# Patient Record
Sex: Female | Born: 1935
Health system: Southern US, Community
[De-identification: ages and names within clinical notes are randomized; demographics above are authoritative.]

## PROBLEM LIST (undated history)

## (undated) DIAGNOSIS — K219 Gastro-esophageal reflux disease without esophagitis: Secondary | ICD-10-CM

## (undated) DIAGNOSIS — R040 Epistaxis: Secondary | ICD-10-CM

## (undated) DIAGNOSIS — Z953 Presence of xenogenic heart valve: Secondary | ICD-10-CM

## (undated) DIAGNOSIS — I779 Disorder of arteries and arterioles, unspecified: Secondary | ICD-10-CM

## (undated) DIAGNOSIS — D509 Iron deficiency anemia, unspecified: Secondary | ICD-10-CM

## (undated) DIAGNOSIS — F329 Major depressive disorder, single episode, unspecified: Secondary | ICD-10-CM

## (undated) DIAGNOSIS — G4733 Obstructive sleep apnea (adult) (pediatric): Secondary | ICD-10-CM

## (undated) DIAGNOSIS — I471 Supraventricular tachycardia, unspecified: Secondary | ICD-10-CM

## (undated) DIAGNOSIS — R55 Syncope and collapse: Secondary | ICD-10-CM

## (undated) DIAGNOSIS — N390 Urinary tract infection, site not specified: Secondary | ICD-10-CM

## (undated) DIAGNOSIS — Z9889 Other specified postprocedural states: Secondary | ICD-10-CM

## (undated) DIAGNOSIS — I639 Cerebral infarction, unspecified: Secondary | ICD-10-CM

## (undated) DIAGNOSIS — S065XAA Traumatic subdural hemorrhage with loss of consciousness status unknown, initial encounter: Secondary | ICD-10-CM

## (undated) DIAGNOSIS — Z923 Personal history of irradiation: Secondary | ICD-10-CM

## (undated) DIAGNOSIS — I350 Nonrheumatic aortic (valve) stenosis: Secondary | ICD-10-CM

## (undated) DIAGNOSIS — F32A Depression, unspecified: Secondary | ICD-10-CM

## (undated) DIAGNOSIS — K5790 Diverticulosis of intestine, part unspecified, without perforation or abscess without bleeding: Secondary | ICD-10-CM

## (undated) DIAGNOSIS — M1990 Unspecified osteoarthritis, unspecified site: Secondary | ICD-10-CM

## (undated) DIAGNOSIS — I5032 Chronic diastolic (congestive) heart failure: Secondary | ICD-10-CM

## (undated) DIAGNOSIS — C50919 Malignant neoplasm of unspecified site of unspecified female breast: Secondary | ICD-10-CM

## (undated) DIAGNOSIS — H269 Unspecified cataract: Secondary | ICD-10-CM

## (undated) DIAGNOSIS — E785 Hyperlipidemia, unspecified: Secondary | ICD-10-CM

## (undated) DIAGNOSIS — M8580 Other specified disorders of bone density and structure, unspecified site: Secondary | ICD-10-CM

## (undated) DIAGNOSIS — K279 Peptic ulcer, site unspecified, unspecified as acute or chronic, without hemorrhage or perforation: Secondary | ICD-10-CM

## (undated) DIAGNOSIS — R011 Cardiac murmur, unspecified: Secondary | ICD-10-CM

## (undated) DIAGNOSIS — I10 Essential (primary) hypertension: Secondary | ICD-10-CM

## (undated) HISTORY — DX: Other specified postprocedural states: Z98.890

## (undated) HISTORY — DX: Disorder of arteries and arterioles, unspecified: I77.9

## (undated) HISTORY — DX: Gastro-esophageal reflux disease without esophagitis: K21.9

## (undated) HISTORY — DX: Supraventricular tachycardia: I47.1

## (undated) HISTORY — DX: Syncope and collapse: R55

## (undated) HISTORY — DX: Cardiac murmur, unspecified: R01.1

## (undated) HISTORY — DX: Urinary tract infection, site not specified: N39.0

## (undated) HISTORY — DX: Nonrheumatic aortic (valve) stenosis: I35.0

## (undated) HISTORY — DX: Supraventricular tachycardia, unspecified: I47.10

## (undated) HISTORY — PX: CHOLECYSTECTOMY: SHX55

## (undated) HISTORY — DX: Peptic ulcer, site unspecified, unspecified as acute or chronic, without hemorrhage or perforation: K27.9

## (undated) HISTORY — PX: VAGINAL HYSTERECTOMY: SUR661

## (undated) HISTORY — DX: Hyperlipidemia, unspecified: E78.5

## (undated) HISTORY — DX: Traumatic subdural hemorrhage with loss of consciousness status unknown, initial encounter: S06.5XAA

## (undated) HISTORY — DX: Chronic diastolic (congestive) heart failure: I50.32

## (undated) HISTORY — DX: Other specified disorders of bone density and structure, unspecified site: M85.80

## (undated) HISTORY — DX: Iron deficiency anemia, unspecified: D50.9

## (undated) HISTORY — DX: Unspecified cataract: H26.9

## (undated) HISTORY — PX: TONSILLECTOMY: SUR1361

## (undated) HISTORY — PX: JOINT REPLACEMENT: SHX530

## (undated) HISTORY — DX: Diverticulosis of intestine, part unspecified, without perforation or abscess without bleeding: K57.90

## (undated) HISTORY — DX: Unspecified osteoarthritis, unspecified site: M19.90

## (undated) HISTORY — DX: Obstructive sleep apnea (adult) (pediatric): G47.33

## (undated) HISTORY — PX: CATARACT EXTRACTION, BILATERAL: SHX1313

## (undated) HISTORY — DX: Malignant neoplasm of unspecified site of unspecified female breast: C50.919

## (undated) HISTORY — PX: BREAST EXCISIONAL BIOPSY: SUR124

## (undated) HISTORY — DX: Depression, unspecified: F32.A

## (undated) HISTORY — PX: KNEE ARTHROSCOPY: SUR90

## (undated) HISTORY — DX: Cerebral infarction, unspecified: I63.9

## (undated) HISTORY — DX: Major depressive disorder, single episode, unspecified: F32.9

---

## 1997-10-15 DIAGNOSIS — C50919 Malignant neoplasm of unspecified site of unspecified female breast: Secondary | ICD-10-CM

## 1997-10-15 HISTORY — PX: BREAST BIOPSY: SHX20

## 1997-10-15 HISTORY — PX: BREAST LUMPECTOMY: SHX2

## 1997-10-15 HISTORY — DX: Malignant neoplasm of unspecified site of unspecified female breast: C50.919

## 2004-09-14 ENCOUNTER — Inpatient Hospital Stay: Payer: Self-pay | Admitting: Endocrinology

## 2004-10-31 ENCOUNTER — Ambulatory Visit: Payer: Self-pay | Admitting: Rheumatology

## 2004-11-07 ENCOUNTER — Ambulatory Visit: Payer: Self-pay | Admitting: Endocrinology

## 2005-02-07 ENCOUNTER — Ambulatory Visit: Payer: Self-pay | Admitting: General Practice

## 2005-12-18 ENCOUNTER — Ambulatory Visit: Payer: Self-pay | Admitting: Endocrinology

## 2007-01-06 ENCOUNTER — Ambulatory Visit: Payer: Self-pay | Admitting: Internal Medicine

## 2007-03-15 ENCOUNTER — Emergency Department: Payer: Self-pay | Admitting: Emergency Medicine

## 2007-03-15 ENCOUNTER — Other Ambulatory Visit: Payer: Self-pay

## 2007-06-26 ENCOUNTER — Ambulatory Visit: Payer: Self-pay | Admitting: Specialist

## 2007-07-10 ENCOUNTER — Ambulatory Visit: Payer: Self-pay | Admitting: Internal Medicine

## 2008-01-12 ENCOUNTER — Ambulatory Visit: Payer: Self-pay | Admitting: Internal Medicine

## 2008-06-10 ENCOUNTER — Emergency Department: Payer: Self-pay | Admitting: Emergency Medicine

## 2008-06-30 ENCOUNTER — Ambulatory Visit: Payer: Self-pay

## 2009-01-13 ENCOUNTER — Ambulatory Visit: Payer: Self-pay | Admitting: Internal Medicine

## 2009-06-12 ENCOUNTER — Emergency Department: Payer: Self-pay | Admitting: Emergency Medicine

## 2010-04-10 ENCOUNTER — Ambulatory Visit: Payer: Self-pay | Admitting: Internal Medicine

## 2010-10-27 ENCOUNTER — Emergency Department: Payer: Self-pay | Admitting: Emergency Medicine

## 2010-12-20 ENCOUNTER — Ambulatory Visit: Payer: Self-pay

## 2011-01-26 ENCOUNTER — Ambulatory Visit: Payer: Self-pay | Admitting: General Practice

## 2011-02-09 ENCOUNTER — Ambulatory Visit: Payer: Self-pay | Admitting: General Practice

## 2011-04-12 ENCOUNTER — Ambulatory Visit: Payer: Self-pay | Admitting: Internal Medicine

## 2011-05-27 ENCOUNTER — Emergency Department: Payer: Self-pay | Admitting: Unknown Physician Specialty

## 2011-05-31 ENCOUNTER — Ambulatory Visit: Payer: Self-pay | Admitting: Internal Medicine

## 2011-10-22 ENCOUNTER — Ambulatory Visit: Payer: Self-pay | Admitting: General Practice

## 2011-10-22 LAB — URINALYSIS, COMPLETE
Bacteria: NONE SEEN
Bilirubin,UR: NEGATIVE
Blood: NEGATIVE
Glucose,UR: NEGATIVE mg/dL (ref 0–75)
Ketone: NEGATIVE
Leukocyte Esterase: NEGATIVE
Ph: 5 (ref 4.5–8.0)
RBC,UR: <1 /HPF (ref 0–5)
Specific Gravity: 1.02 (ref 1.003–1.030)
Squamous Epithelial: 3
WBC UR: <1 /HPF (ref 0–5)

## 2011-10-22 LAB — APTT: Activated PTT: 28.6 secs (ref 23.6–35.9)

## 2011-10-22 LAB — BASIC METABOLIC PANEL
BUN: 20 mg/dL — ABNORMAL HIGH (ref 7–18)
Calcium, Total: 8.9 mg/dL (ref 8.5–10.1)
Co2: 28 mmol/L (ref 21–32)
Creatinine: 1.03 mg/dL (ref 0.60–1.30)
EGFR (Non-African Amer.): 56 — ABNORMAL LOW
Glucose: 88 mg/dL (ref 65–99)
Potassium: 4.3 mmol/L (ref 3.5–5.1)
Sodium: 143 mmol/L (ref 136–145)

## 2011-10-22 LAB — MRSA PCR SCREENING

## 2011-10-22 LAB — PROTIME-INR
INR: 1
Prothrombin Time: 13.2 secs (ref 11.5–14.7)

## 2011-10-23 LAB — URINE CULTURE

## 2011-11-12 ENCOUNTER — Inpatient Hospital Stay: Payer: Self-pay | Admitting: General Practice

## 2011-11-13 LAB — BASIC METABOLIC PANEL
Anion Gap: 11 (ref 7–16)
Calcium, Total: 8.6 mg/dL (ref 8.5–10.1)
Chloride: 107 mmol/L (ref 98–107)
Co2: 26 mmol/L (ref 21–32)
Creatinine: 0.97 mg/dL (ref 0.60–1.30)
EGFR (African American): >60
EGFR (Non-African Amer.): 60 — ABNORMAL LOW
Glucose: 147 mg/dL — ABNORMAL HIGH (ref 65–99)
Osmolality: 290 (ref 275–301)
Sodium: 144 mmol/L (ref 136–145)

## 2011-11-13 LAB — HEMOGLOBIN: HGB: 10 g/dL — ABNORMAL LOW (ref 12.0–16.0)

## 2011-11-14 LAB — BASIC METABOLIC PANEL
BUN: 15 mg/dL (ref 7–18)
Calcium, Total: 8.2 mg/dL — ABNORMAL LOW (ref 8.5–10.1)
Chloride: 108 mmol/L — ABNORMAL HIGH (ref 98–107)
Co2: 28 mmol/L (ref 21–32)
Creatinine: 0.85 mg/dL (ref 0.60–1.30)
EGFR (African American): >60
EGFR (Non-African Amer.): >60
Glucose: 99 mg/dL (ref 65–99)
Potassium: 3.8 mmol/L (ref 3.5–5.1)
Sodium: 144 mmol/L (ref 136–145)

## 2011-11-14 LAB — HEMOGLOBIN
HGB: 8.7 g/dL — ABNORMAL LOW (ref 12.0–16.0)
HGB: 9.5 g/dL — ABNORMAL LOW (ref 12.0–16.0)

## 2011-11-14 LAB — PLATELET COUNT: Platelet: 105 10*3/uL — ABNORMAL LOW (ref 150–440)

## 2011-11-15 ENCOUNTER — Encounter: Payer: Self-pay | Admitting: Internal Medicine

## 2011-11-16 ENCOUNTER — Encounter: Payer: Self-pay | Admitting: Internal Medicine

## 2011-11-17 ENCOUNTER — Inpatient Hospital Stay: Payer: Self-pay | Admitting: Internal Medicine

## 2011-11-17 LAB — COMPREHENSIVE METABOLIC PANEL
Albumin: 2.8 g/dL — ABNORMAL LOW (ref 3.4–5.0)
Alkaline Phosphatase: 56 U/L (ref 50–136)
Anion Gap: 9 (ref 7–16)
Bilirubin,Total: 0.5 mg/dL (ref 0.2–1.0)
Calcium, Total: 8.4 mg/dL — ABNORMAL LOW (ref 8.5–10.1)
EGFR (Non-African Amer.): >60
Glucose: 129 mg/dL — ABNORMAL HIGH (ref 65–99)
Osmolality: 288 (ref 275–301)
Potassium: 3.4 mmol/L — ABNORMAL LOW (ref 3.5–5.1)
SGPT (ALT): 29 U/L
Sodium: 143 mmol/L (ref 136–145)

## 2011-11-17 LAB — CK TOTAL AND CKMB (NOT AT ARMC)
CK, Total: 177 U/L (ref 21–215)
CK-MB: 2.1 ng/mL (ref 0.5–3.6)
CK-MB: 6 ng/mL — ABNORMAL HIGH (ref 0.5–3.6)

## 2011-11-17 LAB — URINALYSIS, COMPLETE
Bacteria: NONE SEEN
Bilirubin,UR: NEGATIVE
Blood: NEGATIVE
Glucose,UR: NEGATIVE mg/dL (ref 0–75)
Leukocyte Esterase: NEGATIVE
Nitrite: NEGATIVE
Specific Gravity: 1.011 (ref 1.003–1.030)
Squamous Epithelial: <1
WBC UR: 1 /HPF (ref 0–5)

## 2011-11-17 LAB — CBC
HCT: 27.7 % — ABNORMAL LOW (ref 35.0–47.0)
HGB: 9.2 g/dL — ABNORMAL LOW (ref 12.0–16.0)
MCH: 28.3 pg (ref 26.0–34.0)
MCHC: 33 g/dL (ref 32.0–36.0)
Platelet: 122 10*3/uL — ABNORMAL LOW (ref 150–440)
RBC: 3.23 10*6/uL — ABNORMAL LOW (ref 3.80–5.20)

## 2011-11-17 LAB — APTT: Activated PTT: 35.4 secs (ref 23.6–35.9)

## 2011-11-17 LAB — PROTIME-INR
INR: 1
Prothrombin Time: 13.5 secs (ref 11.5–14.7)

## 2011-11-17 LAB — TROPONIN I
Troponin-I: 4.2 ng/mL — ABNORMAL HIGH
Troponin-I: 2.1 ng/mL — ABNORMAL HIGH

## 2011-11-18 LAB — CBC WITH DIFFERENTIAL/PLATELET
Basophil #: 0 10*3/uL (ref 0.0–0.1)
HCT: 26.4 % — ABNORMAL LOW (ref 35.0–47.0)
Lymphocyte #: 1.9 10*3/uL (ref 1.0–3.6)
Lymphocyte %: 32 %
Monocyte %: 8.7 %
Neutrophil #: 2.9 10*3/uL (ref 1.4–6.5)
Neutrophil %: 49.2 %
Platelet: 140 10*3/uL — ABNORMAL LOW (ref 150–440)
RBC: 3.08 10*6/uL — ABNORMAL LOW (ref 3.80–5.20)
RDW: 13.7 % (ref 11.5–14.5)
WBC: 6 10*3/uL (ref 3.6–11.0)

## 2011-11-18 LAB — BASIC METABOLIC PANEL
Calcium, Total: 8.6 mg/dL (ref 8.5–10.1)
Chloride: 109 mmol/L — ABNORMAL HIGH (ref 98–107)
Co2: 27 mmol/L (ref 21–32)
Potassium: 3.7 mmol/L (ref 3.5–5.1)
Sodium: 146 mmol/L — ABNORMAL HIGH (ref 136–145)

## 2012-04-21 ENCOUNTER — Ambulatory Visit: Payer: Self-pay | Admitting: Internal Medicine

## 2012-11-17 ENCOUNTER — Encounter: Payer: Self-pay | Admitting: *Deleted

## 2012-11-18 ENCOUNTER — Encounter: Payer: Self-pay | Admitting: *Deleted

## 2012-11-21 ENCOUNTER — Ambulatory Visit: Payer: Self-pay | Admitting: Cardiovascular Disease

## 2012-11-25 ENCOUNTER — Encounter: Payer: Self-pay | Admitting: *Deleted

## 2012-11-26 ENCOUNTER — Ambulatory Visit (INDEPENDENT_AMBULATORY_CARE_PROVIDER_SITE_OTHER): Payer: Medicare Other | Admitting: Cardiovascular Disease

## 2012-11-26 ENCOUNTER — Encounter: Payer: Self-pay | Admitting: Cardiovascular Disease

## 2012-11-26 VITALS — BP 130/82 | HR 83 | Ht 62.0 in | Wt 180.5 lb

## 2012-11-26 DIAGNOSIS — R55 Syncope and collapse: Secondary | ICD-10-CM

## 2012-11-26 DIAGNOSIS — I498 Other specified cardiac arrhythmias: Secondary | ICD-10-CM

## 2012-11-26 DIAGNOSIS — I359 Nonrheumatic aortic valve disorder, unspecified: Secondary | ICD-10-CM

## 2012-11-26 DIAGNOSIS — I471 Supraventricular tachycardia: Secondary | ICD-10-CM

## 2012-11-26 DIAGNOSIS — I350 Nonrheumatic aortic (valve) stenosis: Secondary | ICD-10-CM

## 2012-11-26 DIAGNOSIS — R000 Tachycardia, unspecified: Secondary | ICD-10-CM

## 2012-11-26 NOTE — Patient Instructions (Addendum)
You are doing well. No medication changes were made.  Try red yeast rice for cholesterol  We will schedule an echo for 6 month for aortic valve stenosis  Please call us if you have new issues that need to be addressed before your next appt.  Your physician wants you to follow-up in: 12 months.  You will receive a reminder letter in the mail two months in advance. If you don't receive a letter, please call our office to schedule the follow-up appointment.

## 2012-11-27 DIAGNOSIS — R55 Syncope and collapse: Secondary | ICD-10-CM | POA: Insufficient documentation

## 2012-11-27 DIAGNOSIS — I350 Nonrheumatic aortic (valve) stenosis: Secondary | ICD-10-CM | POA: Insufficient documentation

## 2012-11-27 DIAGNOSIS — I471 Supraventricular tachycardia: Secondary | ICD-10-CM | POA: Insufficient documentation

## 2012-11-27 NOTE — Assessment & Plan Note (Signed)
Details were discussed with the patient and her husband. Uncertain if this was a mechanical fall, or there was loss of consciousness. Husband reports being married and there was no loss of consciousness per his story

## 2012-11-27 NOTE — Assessment & Plan Note (Signed)
By the recent echo January 2014, aortic valve stenosis is moderate. Aortic valve velocity, mean and peak pressures are consistent with moderate stenosis, aortic valve area by continuity equation is likely inaccurate and over estimates the degree of stenosis. Velocity and pressure measurements also consistent with prior echocardiogram August 2012. I suggested she repeat echocardiogram in 6 months time with different echo technician. I suspect she'll have many years prior to needing surgery given the parameters mentioned found on her echocardiogram and given her lack of symptoms.

## 2012-11-27 NOTE — Progress Notes (Signed)
Patient ID: Kayla Gray, female    DOB: November 18, 1935, 77 y.o.   MRN: [id number]  HPI Comments: Kayla Gray is a very pleasant 77 year old woman, patient of Dr. Randa Gray, who presents to establish care in our office. She has a history of aortic valve stenosis, SVT, syncope, hyperlipidemia, iron deficiency anemia, peptic ulcer disease Status post left total knee replacement 11/15/2011 Notes provided indicate a history of SVT in February 2013 while in rehabilitation requiring adenosine, diltiazem and beta blockers, admitted with SVT, elevated troponin felt secondary to demand ischemia from tachycardia. She was started on diltiazem 120 mg daily  Carotid ultrasound August 2012 showed no significant plaque  She presents to discuss her echo cardiogram performed 01/ 8 2014 The shows normal LV function, aortic valve velocity 315 cm/s, peak gradient 56 mm of mercury, mean gradient 19 mm of mercury, estimated aortic valve area 0.61 cm  Echocardiogram in August 2012 velocity through the aortic valve 248 cm/s, peak gradient 25 mm of mercury, mean gradient 14 mm mercury, estimated aortic valve 0.8-1.0 cm  Stress test August 2012 showed normal EKG with exertion, normal stress echo  Overall she feels well and has no symptoms. She does not want any aortic valve surgery at this time. She denies any edema, no chest pressure, no lightheadedness or dizziness. Overall feels well  EKG today shows normal sinus rhythm with rate 83 beats per minute, no significant ST or T wave changes   Outpatient Encounter Prescriptions as of 11/26/2012  Medication Sig Dispense Refill  . acetaminophen (TYLENOL) 500 MG tablet Take 1,000 mg by mouth as needed.      Marland Kitchen aspirin 81 MG tablet Take 81 mg by mouth daily.      . Calcium Carbonate-Vitamin D (CALCIUM 600 + D PO) Take by mouth 2 (two) times daily.      Marland Kitchen diltiazem (CARDIZEM) 120 MG tablet Take 120 mg by mouth daily.      . fluticasone (FLONASE) 50 MCG/ACT nasal spray Place 2  sprays into the nose daily as needed.      . meloxicam (MOBIC) 7.5 MG tablet Take 7.5 mg by mouth 2 (two) times daily.      . Multiple Vitamin (MULTIVITAMIN) tablet Take 1 tablet by mouth daily.      . NON FORMULARY at bedtime. CPAP      . omeprazole (PRILOSEC) 20 MG capsule Take 20 mg by mouth daily.      Marland Kitchen PARoxetine (PAXIL) 20 MG tablet Take 20 mg by mouth daily.      . polyethylene glycol (MIRALAX / GLYCOLAX) packet Take 17 g by mouth as needed.      . [DISCONTINUED] cetirizine (ZYRTEC) 10 MG chewable tablet Chew 10 mg by mouth daily.       No facility-administered encounter medications on file as of 11/26/2012.     Review of Systems  Constitutional: Negative.   HENT: Negative.   Eyes: Negative.   Respiratory: Negative.   Cardiovascular: Negative.   Gastrointestinal: Negative.   Musculoskeletal: Negative.   Skin: Negative.   Neurological: Negative.   Psychiatric/Behavioral: Negative.   All other systems reviewed and are negative.    BP 130/82  Pulse 83  Ht 5\' 2"  (1.575 m)  Wt 180 lb 8 oz (81.874 kg)  BMI 33.01 kg/m2  Physical Exam  Nursing note and vitals reviewed. Constitutional: She is oriented to person, place, and time. She appears well-developed and well-nourished.  HENT:  Head: Normocephalic.  Nose: Nose normal.  Mouth/Throat: Oropharynx  is clear and moist.  Eyes: Conjunctivae are normal. Pupils are equal, round, and reactive to light.  Neck: Normal range of motion. Neck supple. No JVD present.  Cardiovascular: Normal rate, regular rhythm, S1 normal, S2 normal and intact distal pulses.  Exam reveals no gallop and no friction rub.   Murmur heard.  Crescendo systolic murmur is present with a grade of 2/6  Pulmonary/Chest: Effort normal and breath sounds normal. No respiratory distress. She has no wheezes. She has no rales. She exhibits no tenderness.  Abdominal: Soft. Bowel sounds are normal. She exhibits no distension. There is no tenderness.  Musculoskeletal:  Normal range of motion. She exhibits no edema and no tenderness.  Lymphadenopathy:    She has no cervical adenopathy.  Neurological: She is alert and oriented to person, place, and time. Coordination normal.  Skin: Skin is warm and dry. No rash noted. No erythema.  Psychiatric: She has a normal mood and affect. Her behavior is normal. Judgment and thought content normal.    Assessment and Plan

## 2012-11-27 NOTE — Assessment & Plan Note (Signed)
SVT in the postoperative period following knee replacement  February 2013. No further episodes since that time or at least no significant symptoms.

## 2013-01-07 ENCOUNTER — Other Ambulatory Visit: Payer: Self-pay | Admitting: *Deleted

## 2013-01-07 MED ORDER — DILTIAZEM HCL 120 MG PO TABS: 120.0000 mg | ORAL_TABLET | Freq: Every day | ORAL | Status: DC

## 2013-04-30 ENCOUNTER — Ambulatory Visit: Payer: Self-pay | Admitting: Internal Medicine

## 2013-05-26 ENCOUNTER — Other Ambulatory Visit (INDEPENDENT_AMBULATORY_CARE_PROVIDER_SITE_OTHER): Payer: Medicare Other

## 2013-05-26 ENCOUNTER — Other Ambulatory Visit: Payer: Self-pay

## 2013-05-26 DIAGNOSIS — R55 Syncope and collapse: Secondary | ICD-10-CM

## 2013-05-26 DIAGNOSIS — I350 Nonrheumatic aortic (valve) stenosis: Secondary | ICD-10-CM

## 2013-05-26 DIAGNOSIS — I359 Nonrheumatic aortic valve disorder, unspecified: Secondary | ICD-10-CM

## 2013-06-01 ENCOUNTER — Telehealth: Payer: Self-pay | Admitting: *Deleted

## 2013-06-01 NOTE — Telephone Encounter (Signed)
Pt aware of results.  She will follow up with Dr. Mariah Milling at her scheduled appt in February.  She is scheduled to have a colonoscopy in November and will her GI MD fax over paperwork for cardiac clearance.

## 2013-06-01 NOTE — Telephone Encounter (Signed)
Message copied by Marilynne Halsted on Mon Jun 01, 2013  3:07 PM ------      Message from: Antonieta Iba      Created: Mon Jun 01, 2013  1:17 PM       Aortic valve stenosis continues to get worse      Our echo is consistent with prior echo in early 2014      Aortic valve is now even worse than earlier in the year            We'll need to keep a close eye on this      Would probably followup before the end of the year in clinic only if she has no symptoms.            Symptoms of shortness of breath, chest tightness with exertion, she should followup right away      Would not wait until something bad happens      Will likely need to have this valve fixed in the next year or so ------

## 2013-06-01 NOTE — Telephone Encounter (Signed)
Patient wanting echo results

## 2013-06-01 NOTE — Telephone Encounter (Signed)
Message copied by Marilynne Halsted on Mon Jun 01, 2013  3:10 PM ------      Message from: Antonieta Iba      Created: Mon Jun 01, 2013  1:17 PM       Aortic valve stenosis continues to get worse      Our echo is consistent with prior echo in early 2014      Aortic valve is now even worse than earlier in the year            We'll need to keep a close eye on this      Would probably followup before the end of the year in clinic only if she has no symptoms.            Symptoms of shortness of breath, chest tightness with exertion, she should followup right away      Would not wait until something bad happens      Will likely need to have this valve fixed in the next year or so ------

## 2013-07-02 ENCOUNTER — Ambulatory Visit: Payer: Self-pay | Admitting: Unknown Physician Specialty

## 2013-11-16 ENCOUNTER — Inpatient Hospital Stay: Payer: Self-pay | Admitting: Internal Medicine

## 2013-11-16 LAB — CBC WITH DIFFERENTIAL/PLATELET
BASOS PCT: 0.8 %
Basophil #: 0.1 10*3/uL (ref 0.0–0.1)
Eosinophil #: 0.3 10*3/uL (ref 0.0–0.7)
Eosinophil %: 3.3 %
HCT: 39.6 % (ref 35.0–47.0)
HGB: 12.8 g/dL (ref 12.0–16.0)
LYMPHS ABS: 1.7 10*3/uL (ref 1.0–3.6)
Lymphocyte %: 22.4 %
MCH: 27.5 pg (ref 26.0–34.0)
MCHC: 32.4 g/dL (ref 32.0–36.0)
MCV: 85 fL (ref 80–100)
Monocyte #: 0.6 x10 3/mm (ref 0.2–0.9)
Monocyte %: 7.8 %
Neutrophil #: 5.1 10*3/uL (ref 1.4–6.5)
Neutrophil %: 65.7 %
Platelet: 149 10*3/uL — ABNORMAL LOW (ref 150–440)
RBC: 4.66 10*6/uL (ref 3.80–5.20)
RDW: 14.2 % (ref 11.5–14.5)
WBC: 7.7 10*3/uL (ref 3.6–11.0)

## 2013-11-16 LAB — BASIC METABOLIC PANEL
ANION GAP: 4 — AB (ref 7–16)
BUN: 14 mg/dL (ref 7–18)
CREATININE: 1.03 mg/dL (ref 0.60–1.30)
Calcium, Total: 9.6 mg/dL (ref 8.5–10.1)
Chloride: 106 mmol/L (ref 98–107)
Co2: 29 mmol/L (ref 21–32)
GFR CALC NON AF AMER: 53 — AB
GLUCOSE: 81 mg/dL (ref 65–99)
Osmolality: 277 (ref 275–301)
POTASSIUM: 3.7 mmol/L (ref 3.5–5.1)
Sodium: 139 mmol/L (ref 136–145)

## 2013-11-16 LAB — TROPONIN I

## 2013-11-17 DIAGNOSIS — I517 Cardiomegaly: Secondary | ICD-10-CM

## 2013-11-17 LAB — LIPID PANEL
Cholesterol: 202 mg/dL — ABNORMAL HIGH (ref 0–200)
HDL: 41 mg/dL (ref 40–60)
LDL CHOLESTEROL, CALC: 129 mg/dL — AB (ref 0–100)
TRIGLYCERIDES: 159 mg/dL (ref 0–200)
VLDL CHOLESTEROL, CALC: 32 mg/dL (ref 5–40)

## 2013-11-18 ENCOUNTER — Encounter: Payer: Self-pay | Admitting: Internal Medicine

## 2013-11-19 LAB — URINALYSIS, COMPLETE
Bilirubin,UR: NEGATIVE
Glucose,UR: NEGATIVE mg/dL (ref 0–75)
KETONE: NEGATIVE
Leukocyte Esterase: NEGATIVE
NITRITE: NEGATIVE
Ph: 5 (ref 4.5–8.0)
Protein: NEGATIVE
RBC,UR: 2 /HPF (ref 0–5)
SPECIFIC GRAVITY: 1.013 (ref 1.003–1.030)
Squamous Epithelial: NONE SEEN
WBC UR: 6 /HPF (ref 0–5)

## 2013-11-21 LAB — URINE CULTURE

## 2013-11-25 ENCOUNTER — Ambulatory Visit (INDEPENDENT_AMBULATORY_CARE_PROVIDER_SITE_OTHER): Payer: Medicare Other | Admitting: Cardiovascular Disease

## 2013-11-25 ENCOUNTER — Encounter: Payer: Self-pay | Admitting: Cardiovascular Disease

## 2013-11-25 VITALS — BP 144/85 | HR 80 | Ht 62.5 in | Wt 177.0 lb

## 2013-11-25 DIAGNOSIS — R55 Syncope and collapse: Secondary | ICD-10-CM

## 2013-11-25 DIAGNOSIS — I498 Other specified cardiac arrhythmias: Secondary | ICD-10-CM

## 2013-11-25 DIAGNOSIS — I350 Nonrheumatic aortic (valve) stenosis: Secondary | ICD-10-CM

## 2013-11-25 DIAGNOSIS — R42 Dizziness and giddiness: Secondary | ICD-10-CM

## 2013-11-25 DIAGNOSIS — I359 Nonrheumatic aortic valve disorder, unspecified: Secondary | ICD-10-CM

## 2013-11-25 DIAGNOSIS — I639 Cerebral infarction, unspecified: Secondary | ICD-10-CM | POA: Insufficient documentation

## 2013-11-25 DIAGNOSIS — I6350 Cerebral infarction due to unspecified occlusion or stenosis of unspecified cerebral artery: Secondary | ICD-10-CM

## 2013-11-25 DIAGNOSIS — I471 Supraventricular tachycardia: Secondary | ICD-10-CM

## 2013-11-25 NOTE — Assessment & Plan Note (Signed)
Moderate aortic valve stenosis seen previously on echocardiogram January 2014.

## 2013-11-25 NOTE — Assessment & Plan Note (Addendum)
No recent episodes of SVT. 30 day monitor ordered to rule out other arrhythmia given recent stroke and dizziness

## 2013-11-25 NOTE — Assessment & Plan Note (Signed)
No recent episodes of syncope.

## 2013-11-25 NOTE — Assessment & Plan Note (Signed)
Recent stroke of uncertain etiology. She does report having periods of tachycardia, dizziness, occasional shortness of breath. Unable to exclude episodes of atrial fibrillation or other arrhythmia. She has had previous episodes of SVT. Dizziness episodes feel different. We have ordered a 30 day monitor to rule out arrhythmia.

## 2013-11-25 NOTE — Progress Notes (Signed)
Patient ID: Kayla Gray, female    DOB: 1936/05/27, 78 y.o.   MRN: [id number]  HPI Comments: [name] is a very pleasant 78 year old woman who presents for routine followup.  history of aortic valve stenosis, SVT, syncope, hyperlipidemia, iron deficiency anemia, peptic ulcer disease Status post left total knee replacement 11/15/2011 Notes provided indicate a history of SVT in February 2013 while in rehabilitation requiring adenosine, diltiazem and beta blockers, admitted with SVT, elevated troponin felt secondary to demand ischemia from tachycardia. She was started on diltiazem 120 mg daily  Carotid ultrasound August 2012 showed no significant plaque  echocardiogram 01/ 8 2014 showed normal LV function, aortic valve velocity 315 cm/s, peak gradient 56 mm of mercury, mean gradient 19 mm of mercury, estimated aortic valve area 0.61 cm  Echocardiogram in August 2012 velocity through the aortic valve 248 cm/s, peak gradient 25 mm of mercury, mean gradient 14 mm mercury, estimated aortic valve 0.8-1.0 cm Stress test August 2012 showed normal EKG with exertion, normal stress echo  In followup today, she reports that on 11/15/2013 she felt funny in her chest while she was in bed. She stood up, had left leg numbness like it was "dead". It gave way from underneath her. She continued to have symptoms on every second and presented to the emergency room February 3. She had CT scan, MRI, echocardiogram, carotid ultrasound. CT and ultrasound were unrevealing. MRI showed acute posterior right corona radiata lacunar infarct. Also has small remote bilateral cerebral infarcts, mild chronic small vessel ischemic disease in cerebral atrophy. Her aspirin was held and she was started on Plavix at the time of discharge. This was not discussed with the patient. She is currently in rehabilitation at [facility] and reports that her left leg strength is slowly improving.   Interestingly echocardiogram was done while she  was in the hospital and he was detailed that she had moderate calcification, fusion of left and right on her cusp. No degree of aortic valve stenosis was mentioned. Peak gradient estimated at 33 mm of mercury, estimated aortic valve area 0.8, peak velocity 2.9 m/s  EKG today shows normal sinus rhythm with rate 80 beats per minute, no significant ST or T wave changes   Outpatient Encounter Prescriptions as of 11/25/2013  Medication Sig  . acetaminophen (TYLENOL) 325 MG tablet Take 650 mg by mouth every 6 (six) hours as needed.  Marland Kitchen aspirin 81 MG tablet Take 81 mg by mouth daily.  Marland Kitchen atorvastatin (LIPITOR) 40 MG tablet Take 40 mg by mouth daily.  . Calcium Carbonate-Vitamin D (CALCIUM 600 + D PO) Take by mouth 2 (two) times daily.  . clopidogrel (PLAVIX) 75 MG tablet Take 75 mg by mouth daily with breakfast.  . diltiazem (CARDIZEM) 120 MG tablet Take 1 tablet (120 mg total) by mouth daily.  . fluticasone (FLONASE) 50 MCG/ACT nasal spray Place 2 sprays into the nose daily as needed.  . fosfomycin (MONUROL) 3 G PACK Take 3 g by mouth every 3 (three) days.  . meloxicam (MOBIC) 7.5 MG tablet Take 7.5 mg by mouth daily.   . Multiple Vitamin (MULTIVITAMIN) tablet Take 1 tablet by mouth daily.  . NON FORMULARY at bedtime. CPAP  . omeprazole (PRILOSEC) 20 MG capsule Take 20 mg by mouth 2 (two) times daily before a meal.   . PARoxetine (PAXIL) 20 MG tablet Take 20 mg by mouth daily.  . polyethylene glycol (MIRALAX / GLYCOLAX) packet Take 17 g by mouth as needed.  . sennosides-docusate sodium (  SENOKOT-S) 8.6-50 MG tablet Take 1 tablet by mouth 2 (two) times daily.    Review of Systems  Constitutional: Negative.   HENT: Negative.   Eyes: Negative.   Respiratory: Negative.   Cardiovascular: Negative.   Gastrointestinal: Negative.   Endocrine: Negative.   Musculoskeletal: Negative.   Skin: Negative.   Allergic/Immunologic: Negative.   Neurological: Negative.   Hematological: Negative.    Psychiatric/Behavioral: Negative.   All other systems reviewed and are negative.    Ht 5' 2.5" (1.588 m)  Wt 177 lb (80.287 kg)  BMI 31.84 kg/m2 Blood pressure 144/85, heart rate 80  Physical Exam  Nursing note and vitals reviewed. Constitutional: She is oriented to person, place, and time. She appears well-developed and well-nourished.  HENT:  Head: Normocephalic.  Nose: Nose normal.  Mouth/Throat: Oropharynx is clear and moist.  Eyes: Conjunctivae are normal. Pupils are equal, round, and reactive to light.  Neck: Normal range of motion. Neck supple. No JVD present.  Cardiovascular: Normal rate, regular rhythm, S1 normal, S2 normal and intact distal pulses.  Exam reveals no gallop and no friction rub.   Murmur heard.  Crescendo systolic murmur is present with a grade of 2/6  Pulmonary/Chest: Effort normal and breath sounds normal. No respiratory distress. She has no wheezes. She has no rales. She exhibits no tenderness.  Abdominal: Soft. Bowel sounds are normal. She exhibits no distension. There is no tenderness.  Musculoskeletal: Normal range of motion. She exhibits no edema and no tenderness.  Lymphadenopathy:    She has no cervical adenopathy.  Neurological: She is alert and oriented to person, place, and time. Coordination normal.  Skin: Skin is warm and dry. No rash noted. No erythema.  Psychiatric: She has a normal mood and affect. Her behavior is normal. Judgment and thought content normal.    Assessment and Plan

## 2013-11-25 NOTE — Patient Instructions (Signed)
Please start aspirin 81 mg daily Continue plavix 75 mg daily  We will order a 30 day monitor for dizziness, CVA, syncope  Please call us if you have new issues that need to be addressed before your next appt.  Your physician wants you to follow-up in: 6 weeks

## 2013-12-04 DIAGNOSIS — R55 Syncope and collapse: Secondary | ICD-10-CM

## 2013-12-07 ENCOUNTER — Telehealth: Payer: Self-pay

## 2013-12-07 NOTE — Telephone Encounter (Signed)
I have looked at the echo from the hospital There is aortic valve stenosis Likely mild to moderate Numbers are relatively consistent with echocardiogram from January 2014 Mildly worse from echo in 2012 Would consider repeat echo in 2 years

## 2013-12-07 NOTE — Telephone Encounter (Signed)
Have not seen the cardio rhythm strips

## 2013-12-07 NOTE — Telephone Encounter (Signed)
Spoke w/ pt.  She wanted to make sure that he we received report from [facility] on her "event" on Saturday. Assured pt that we received report and Dr. [name] is aware, for her to continue to wear her monitor and continue w/ her daily activities, as she was wondering if she should cancel her therapy for today.  Pt inquired if Dr. [name] had "gotten those numbers from the hospital on my ECHO". Advised pt that I am unaware of what numbers she is speaking about, but she states that "Dr. [name] knows what I'm talking about" and would not elaborate further. Please advise.  Thank you.

## 2013-12-07 NOTE — Telephone Encounter (Signed)
Pt called and states she had an "event" happen Saturday night, and also has some questions.States she is wearing a monitor. Please call.

## 2013-12-09 NOTE — Telephone Encounter (Signed)
Reviewed results w/ pt.  

## 2013-12-24 ENCOUNTER — Telehealth: Payer: Self-pay | Admitting: *Deleted

## 2013-12-24 NOTE — Telephone Encounter (Signed)
Lmom to call our office time to schedule an echo

## 2014-01-04 ENCOUNTER — Emergency Department: Payer: Self-pay | Admitting: Emergency Medicine

## 2014-01-05 LAB — CBC
HCT: 34.7 % — ABNORMAL LOW (ref 35.0–47.0)
HGB: 11.2 g/dL — AB (ref 12.0–16.0)
MCH: 27.3 pg (ref 26.0–34.0)
MCHC: 32.3 g/dL (ref 32.0–36.0)
MCV: 85 fL (ref 80–100)
Platelet: 138 10*3/uL — ABNORMAL LOW (ref 150–440)
RBC: 4.1 10*6/uL (ref 3.80–5.20)
RDW: 14.2 % (ref 11.5–14.5)
WBC: 7.3 10*3/uL (ref 3.6–11.0)

## 2014-01-05 LAB — BASIC METABOLIC PANEL
Anion Gap: 5 — ABNORMAL LOW (ref 7–16)
BUN: 19 mg/dL — ABNORMAL HIGH (ref 7–18)
CALCIUM: 8.8 mg/dL (ref 8.5–10.1)
CHLORIDE: 109 mmol/L — AB (ref 98–107)
CREATININE: 0.94 mg/dL (ref 0.60–1.30)
Co2: 27 mmol/L (ref 21–32)
GFR CALC NON AF AMER: 59 — AB
GLUCOSE: 103 mg/dL — AB (ref 65–99)
Osmolality: 284 (ref 275–301)
Potassium: 3.9 mmol/L (ref 3.5–5.1)
SODIUM: 141 mmol/L (ref 136–145)

## 2014-01-05 LAB — SEDIMENTATION RATE: Erythrocyte Sed Rate: 18 mm/hr (ref 0–30)

## 2014-01-06 ENCOUNTER — Encounter: Payer: Self-pay | Admitting: Cardiovascular Disease

## 2014-01-06 ENCOUNTER — Ambulatory Visit (INDEPENDENT_AMBULATORY_CARE_PROVIDER_SITE_OTHER): Payer: Medicare Other | Admitting: Cardiovascular Disease

## 2014-01-06 VITALS — BP 112/72 | HR 75 | Ht 62.5 in | Wt 176.5 lb

## 2014-01-06 DIAGNOSIS — R000 Tachycardia, unspecified: Secondary | ICD-10-CM | POA: Insufficient documentation

## 2014-01-06 DIAGNOSIS — I359 Nonrheumatic aortic valve disorder, unspecified: Secondary | ICD-10-CM

## 2014-01-06 DIAGNOSIS — IMO0002 Reserved for concepts with insufficient information to code with codable children: Secondary | ICD-10-CM

## 2014-01-06 DIAGNOSIS — M170 Bilateral primary osteoarthritis of knee: Secondary | ICD-10-CM | POA: Insufficient documentation

## 2014-01-06 DIAGNOSIS — I498 Other specified cardiac arrhythmias: Secondary | ICD-10-CM

## 2014-01-06 DIAGNOSIS — I639 Cerebral infarction, unspecified: Secondary | ICD-10-CM

## 2014-01-06 DIAGNOSIS — I6350 Cerebral infarction due to unspecified occlusion or stenosis of unspecified cerebral artery: Secondary | ICD-10-CM

## 2014-01-06 DIAGNOSIS — I350 Nonrheumatic aortic (valve) stenosis: Secondary | ICD-10-CM

## 2014-01-06 DIAGNOSIS — M1710 Unilateral primary osteoarthritis, unspecified knee: Secondary | ICD-10-CM

## 2014-01-06 DIAGNOSIS — I471 Supraventricular tachycardia: Secondary | ICD-10-CM

## 2014-01-06 MED ORDER — PROPRANOLOL HCL 20 MG PO TABS: 20.0000 mg | ORAL_TABLET | Freq: Three times a day (TID) | ORAL | Status: DC | PRN

## 2014-01-06 MED ORDER — DILTIAZEM HCL 120 MG PO TABS: 120.0000 mg | ORAL_TABLET | Freq: Every day | ORAL | Status: DC

## 2014-01-06 NOTE — Patient Instructions (Signed)
You are doing well. No medication changes were made.  Please call us if you have new issues that need to be addressed before your next appt.  Your physician wants you to follow-up in: 6 months.  You will receive a reminder letter in the mail two months in advance. If you don't receive a letter, please call our office to schedule the follow-up appointment.   

## 2014-01-06 NOTE — Assessment & Plan Note (Signed)
Moderate aortic valve stenosis. Repeat echocardiogram in 2017

## 2014-01-06 NOTE — Progress Notes (Signed)
Patient ID: Kayla Gray, female    DOB: Jan 11, 1936, 78 y.o.   MRN: [id number]  HPI Comments: Kayla Gray is a very pleasant 78 year old woman who presents for routine followup.  history of moderate aortic valve stenosis, SVT, syncope, hyperlipidemia, iron deficiency anemia, peptic ulcer disease Status post left total knee replacement 11/15/2011 Notes provided indicate a history of SVT in February 2013 while in rehabilitation requiring adenosine, diltiazem and beta blockers, admitted with SVT, elevated troponin felt secondary to demand ischemia from tachycardia. She was started on diltiazem 120 mg daily  Carotid ultrasound August 2012 showed no significant plaque  echocardiogram 01/ 8 2014 showed normal LV function, aortic valve velocity 315 cm/s, peak gradient 56 mm of mercury, mean gradient 19 mm of mercury, estimated aortic valve area 0.61 cm  Echocardiogram in August 2012 velocity through the aortic valve 248 cm/s, peak gradient 25 mm of mercury, mean gradient 14 mm mercury, estimated aortic valve 0.8-1.0 cm Stress test August 2012 showed normal EKG with exertion, normal stress echo   on 11/15/2013 she felt funny in her chest while she was in bed, had left leg numbness like it was "dead". It gave way from underneath her. She continued to have symptoms and presented to the emergency room February 3. She had CT scan, MRI, echocardiogram, carotid ultrasound. CT and ultrasound were unrevealing. MRI showed acute posterior right corona radiata lacunar infarct. Also has small remote bilateral cerebral infarcts, mild chronic small vessel ischemic disease in cerebral atrophy. Her aspirin was held and she was started on Plavix at the time of discharge.   In followup today, she reports that she is much stronger, feeling well. 30 day monitor was performed given her TIA/stroke. This showed sinus tachycardia during activity or exercise otherwise no significant arrhythmia. In general she feels well today  with no complaints. She does report having shortness of breath and tachycardia when she does exercise.  She reports having significant knee pain and has been seen by orthopedics for cortisone injection by Dr. Marry Guan. She was told not to do activities that would aggravate her symptoms.  EKG today shows normal sinus rhythm with rate 75 beats per minute, no significant ST or T wave changes   Outpatient Encounter Prescriptions as of 01/06/2014  Medication Sig  . acetaminophen (TYLENOL) 325 MG tablet Take 650 mg by mouth every 6 (six) hours as needed.  Marland Kitchen aspirin 81 MG tablet Take 81 mg by mouth daily.  . Calcium Carbonate-Vitamin D (CALCIUM 600 + D PO) Take by mouth 2 (two) times daily.  . clopidogrel (PLAVIX) 75 MG tablet Take 75 mg by mouth daily with breakfast.  . diltiazem (CARDIZEM) 120 MG tablet Take 1 tablet (120 mg total) by mouth daily.  . fluticasone (FLONASE) 50 MCG/ACT nasal spray Place 2 sprays into the nose daily as needed.  . meloxicam (MOBIC) 7.5 MG tablet Take 7.5 mg by mouth daily.   . Multiple Vitamin (MULTIVITAMIN) tablet Take 1 tablet by mouth daily.  . NON FORMULARY at bedtime. CPAP  . PARoxetine (PAXIL) 20 MG tablet Take 20 mg by mouth daily.  . pravastatin (PRAVACHOL) 40 MG tablet Take 40 mg by mouth daily.  . sennosides-docusate sodium (SENOKOT-S) 8.6-50 MG tablet Take 1 tablet by mouth 2 (two) times daily.   Review of Systems  Constitutional: Negative.   HENT: Negative.   Eyes: Negative.   Respiratory: Negative.   Cardiovascular: Positive for palpitations.  Gastrointestinal: Negative.   Endocrine: Negative.   Musculoskeletal: Positive for arthralgias.  Skin: Negative.   Allergic/Immunologic: Negative.   Neurological: Negative.   Hematological: Negative.   Psychiatric/Behavioral: Negative.   All other systems reviewed and are negative.    BP 112/72  Pulse 75  Ht 5' 2.5" (1.588 m)  Wt 176 lb 8 oz (80.06 kg)  BMI 31.75 kg/m2  Physical Exam  Nursing note  and vitals reviewed. Constitutional: She is oriented to person, place, and time. She appears well-developed and well-nourished.  HENT:  Head: Normocephalic.  Nose: Nose normal.  Mouth/Throat: Oropharynx is clear and moist.  Eyes: Conjunctivae are normal. Pupils are equal, round, and reactive to light.  Neck: Normal range of motion. Neck supple. No JVD present.  Cardiovascular: Normal rate, regular rhythm, S1 normal, S2 normal and intact distal pulses.  Exam reveals no gallop and no friction rub.   Murmur heard.  Crescendo systolic murmur is present with a grade of 2/6  Pulmonary/Chest: Effort normal and breath sounds normal. No respiratory distress. She has no wheezes. She has no rales. She exhibits no tenderness.  Abdominal: Soft. Bowel sounds are normal. She exhibits no distension. There is no tenderness.  Musculoskeletal: Normal range of motion. She exhibits no edema and no tenderness.  Lymphadenopathy:    She has no cervical adenopathy.  Neurological: She is alert and oriented to person, place, and time. Coordination normal.  Skin: Skin is warm and dry. No rash noted. No erythema.  Psychiatric: She has a normal mood and affect. Her behavior is normal. Judgment and thought content normal.    Assessment and Plan

## 2014-01-06 NOTE — Assessment & Plan Note (Signed)
He is currently taking aspirin and Plavix. No arrhythmias seen on 30 day monitor. Continue aggressive cholesterol management for PAD

## 2014-01-06 NOTE — Assessment & Plan Note (Addendum)
Significant sinus tachycardia on exertion. Suspect this is from deconditioning. She is unable to exercise given her chronic knee pain. We have recommended water therapy, recumbent bike as tolerated. We have suggested she take propranolol prior to exercise and as needed for symptoms. She is reluctant to take metoprolol daily or twice a day.

## 2014-01-06 NOTE — Assessment & Plan Note (Signed)
No further episodes of SVT per her history

## 2014-01-06 NOTE — Assessment & Plan Note (Signed)
Recommended weight loss, aqua therapy. She has followup with Dr. Marry Guan

## 2014-01-07 ENCOUNTER — Ambulatory Visit (INDEPENDENT_AMBULATORY_CARE_PROVIDER_SITE_OTHER): Payer: Medicare Other

## 2014-01-07 ENCOUNTER — Other Ambulatory Visit: Payer: Self-pay

## 2014-01-07 DIAGNOSIS — I471 Supraventricular tachycardia: Secondary | ICD-10-CM

## 2014-01-07 DIAGNOSIS — R55 Syncope and collapse: Secondary | ICD-10-CM

## 2014-01-07 DIAGNOSIS — I639 Cerebral infarction, unspecified: Secondary | ICD-10-CM

## 2014-01-07 DIAGNOSIS — R42 Dizziness and giddiness: Secondary | ICD-10-CM

## 2014-01-08 ENCOUNTER — Other Ambulatory Visit: Payer: Self-pay

## 2014-01-08 MED ORDER — DILTIAZEM HCL 120 MG PO TABS: 120.0000 mg | ORAL_TABLET | Freq: Every day | ORAL | Status: DC

## 2014-01-25 ENCOUNTER — Ambulatory Visit: Payer: Self-pay | Admitting: Physician Assistant

## 2014-03-09 ENCOUNTER — Ambulatory Visit: Payer: Self-pay | Admitting: Physician Assistant

## 2014-06-09 ENCOUNTER — Ambulatory Visit: Payer: Self-pay | Admitting: Physician Assistant

## 2014-07-05 ENCOUNTER — Encounter: Payer: Self-pay | Admitting: Cardiovascular Disease

## 2014-07-05 ENCOUNTER — Ambulatory Visit (INDEPENDENT_AMBULATORY_CARE_PROVIDER_SITE_OTHER): Payer: Medicare Other | Admitting: Cardiovascular Disease

## 2014-07-05 VITALS — BP 134/90 | HR 70 | Ht 62.0 in | Wt 174.0 lb

## 2014-07-05 DIAGNOSIS — M170 Bilateral primary osteoarthritis of knee: Secondary | ICD-10-CM

## 2014-07-05 DIAGNOSIS — R011 Cardiac murmur, unspecified: Secondary | ICD-10-CM

## 2014-07-05 DIAGNOSIS — I10 Essential (primary) hypertension: Secondary | ICD-10-CM

## 2014-07-05 DIAGNOSIS — I6350 Cerebral infarction due to unspecified occlusion or stenosis of unspecified cerebral artery: Secondary | ICD-10-CM

## 2014-07-05 DIAGNOSIS — I498 Other specified cardiac arrhythmias: Secondary | ICD-10-CM

## 2014-07-05 DIAGNOSIS — M1710 Unilateral primary osteoarthritis, unspecified knee: Secondary | ICD-10-CM

## 2014-07-05 DIAGNOSIS — R000 Tachycardia, unspecified: Secondary | ICD-10-CM

## 2014-07-05 DIAGNOSIS — I350 Nonrheumatic aortic (valve) stenosis: Secondary | ICD-10-CM

## 2014-07-05 DIAGNOSIS — I359 Nonrheumatic aortic valve disorder, unspecified: Secondary | ICD-10-CM

## 2014-07-05 DIAGNOSIS — I639 Cerebral infarction, unspecified: Secondary | ICD-10-CM

## 2014-07-05 NOTE — Assessment & Plan Note (Signed)
She is interested in total knee replacement surgery. Will evaluate her aortic valve with echocardiogram to determine whether she should proceed with valve surgery prior to her knee surgery

## 2014-07-05 NOTE — Assessment & Plan Note (Signed)
No recent episodes of tachycardia reported.

## 2014-07-05 NOTE — Assessment & Plan Note (Signed)
No further episodes of TIA. She is on aspirin Plavix

## 2014-07-05 NOTE — Patient Instructions (Signed)
You are doing well. No medication changes were made.  We will schedule an echocardiogram for aortic valve stenosis  Please call us if you have new issues that need to be addressed before your next appt.  Your physician wants you to follow-up in: 6 months.  You will receive a reminder letter in the mail two months in advance. If you don't receive a letter, please call our office to schedule the follow-up appointment.

## 2014-07-05 NOTE — Progress Notes (Signed)
Patient ID: Kayla Gray, female    DOB: November 10, 1935, 78 y.o.   MRN: [id number]  HPI Comments: Kayla Gray is a very pleasant 78 year old woman who presents for routine followup.  history of moderate aortic valve stenosis, SVT, syncope, hyperlipidemia, iron deficiency anemia, peptic ulcer disease Status post left total knee replacement 11/15/2011 Notes provided indicate a history of SVT in February 2013 while in rehabilitation requiring adenosine, diltiazem and beta blockers, admitted with SVT, elevated troponin felt secondary to demand ischemia from tachycardia. She was started on diltiazem 120 mg daily February 2015 with TIA-type symptoms, workup in the hospital, continued on aspirin and Plavix. 30 day monitor did not show significant arrhythmia  In followup, she reports that she has significant back pain, some knee pain. She is thinking about having the surgery. She is concerned about her aortic valve. She is relatively sedentary, but does have significant fatigue with exertion. Denies having any chest tightness but she does not push herself very hard. She would like her aortic valve valuated prior to any surgery.  Carotid ultrasound August 2012 showed no significant plaque  echocardiogram 01/ 8 2014 showed normal LV function, aortic valve velocity 315 cm/s, peak gradient 56 mm of mercury, mean gradient 19 mm of mercury, estimated aortic valve area 0.61 cm Echocardiogram in late 2014 showing velocity of 400   Stress test August 2012 showed normal EKG with exertion, normal stress echo  Lab work may 2015 showing total cholesterol 200, LDL 111  EKG today shows normal sinus rhythm with rate 70 beats per minute, no significant ST or T wave changes   Outpatient Encounter Prescriptions as of 07/05/2014  Medication Sig  . acetaminophen (TYLENOL) 325 MG tablet Take 650 mg by mouth every 6 (six) hours as needed.  Marland Kitchen aspirin 81 MG tablet Take 81 mg by mouth daily.  . Calcium Carbonate-Vitamin D  (CALCIUM 600 + D PO) Take by mouth 2 (two) times daily.  . clopidogrel (PLAVIX) 75 MG tablet Take 75 mg by mouth daily with breakfast.  . diltiazem (CARDIZEM) 120 MG tablet Take 1 tablet (120 mg total) by mouth daily.  . fluticasone (FLONASE) 50 MCG/ACT nasal spray Place 2 sprays into the nose daily as needed.  . meloxicam (MOBIC) 7.5 MG tablet Take 7.5 mg by mouth daily.   . Multiple Vitamin (MULTIVITAMIN) tablet Take 1 tablet by mouth daily.  . NON FORMULARY at bedtime. CPAP  . pantoprazole (PROTONIX) 40 MG tablet   . PARoxetine (PAXIL) 20 MG tablet Take 20 mg by mouth daily.  . polyethylene glycol powder (MIRALAX) powder Take by mouth.  . pravastatin (PRAVACHOL) 40 MG tablet Take 40 mg by mouth daily.  . propranolol (INDERAL) 20 MG tablet Take 1 tablet (20 mg total) by mouth 3 (three) times daily as needed (tachycardia).  . sennosides-docusate sodium (SENOKOT-S) 8.6-50 MG tablet Take 1 tablet by mouth 2 (two) times daily.    Review of Systems  Constitutional: Negative.   HENT: Negative.   Eyes: Negative.   Respiratory: Negative.   Cardiovascular: Negative.   Gastrointestinal: Negative.   Endocrine: Negative.   Musculoskeletal: Positive for arthralgias and back pain.  Skin: Negative.   Allergic/Immunologic: Negative.   Neurological: Negative.   Hematological: Negative.   Psychiatric/Behavioral: Negative.   All other systems reviewed and are negative.   BP 134/90  Pulse 70  Ht 5\' 2"  (1.575 m)  Wt 174 lb (78.926 kg)  BMI 31.82 kg/m2  Physical Exam  Nursing note and vitals reviewed.  Constitutional: She is oriented to person, place, and time. She appears well-developed and well-nourished.  HENT:  Head: Normocephalic.  Nose: Nose normal.  Mouth/Throat: Oropharynx is clear and moist.  Eyes: Conjunctivae are normal. Pupils are equal, round, and reactive to light.  Neck: Normal range of motion. Neck supple. No JVD present.  Cardiovascular: Normal rate, regular rhythm, S1  normal, S2 normal and intact distal pulses.  Exam reveals no gallop and no friction rub.   Murmur heard.  Crescendo systolic murmur is present with a grade of 3/6  Pulmonary/Chest: Effort normal and breath sounds normal. No respiratory distress. She has no wheezes. She has no rales. She exhibits no tenderness.  Abdominal: Soft. Bowel sounds are normal. She exhibits no distension. There is no tenderness.  Musculoskeletal: Normal range of motion. She exhibits no edema and no tenderness.  Lymphadenopathy:    She has no cervical adenopathy.  Neurological: She is alert and oriented to person, place, and time. Coordination normal.  Skin: Skin is warm and dry. No rash noted. No erythema.  Psychiatric: She has a normal mood and affect. Her behavior is normal. Judgment and thought content normal.    Assessment and Plan       since

## 2014-07-05 NOTE — Assessment & Plan Note (Addendum)
Prior echoes documenting severe aortic valve stenosis. These were discussed with the patient. Repeat echocardiogram has been ordered prior to possible knee surgery. If this shows severe aortic valve stenosis, we'll probably proceed with aortic valve surgery prior to knee surgery. She would first need cardiac catheterization.

## 2014-07-13 ENCOUNTER — Other Ambulatory Visit: Payer: Self-pay

## 2014-07-13 ENCOUNTER — Other Ambulatory Visit (INDEPENDENT_AMBULATORY_CARE_PROVIDER_SITE_OTHER): Payer: Medicare Other

## 2014-07-13 DIAGNOSIS — R011 Cardiac murmur, unspecified: Secondary | ICD-10-CM

## 2014-07-13 DIAGNOSIS — I359 Nonrheumatic aortic valve disorder, unspecified: Secondary | ICD-10-CM

## 2014-07-15 ENCOUNTER — Telehealth: Payer: Self-pay

## 2014-07-15 NOTE — Telephone Encounter (Signed)
Pt would like echo results. Please call. 

## 2014-07-15 NOTE — Telephone Encounter (Signed)
Reviewed results w/ pt.   She verbalizes understanding and will call back w/ any questions or concerns.  

## 2014-09-08 ENCOUNTER — Ambulatory Visit: Payer: Self-pay | Admitting: Family Medicine

## 2014-10-12 ENCOUNTER — Telehealth: Payer: Self-pay | Admitting: Cardiovascular Disease

## 2014-10-12 ENCOUNTER — Ambulatory Visit: Payer: Self-pay | Admitting: General Practice

## 2014-10-12 LAB — URINALYSIS, COMPLETE
Bilirubin,UR: NEGATIVE
Blood: NEGATIVE
GLUCOSE, UR: NEGATIVE mg/dL (ref 0–75)
KETONE: NEGATIVE
Nitrite: NEGATIVE
Ph: 6 (ref 4.5–8.0)
Protein: NEGATIVE
Specific Gravity: 1.015 (ref 1.003–1.030)
Squamous Epithelial: 1
WBC UR: 822 /HPF (ref 0–5)

## 2014-10-12 LAB — BASIC METABOLIC PANEL
Anion Gap: 6 — ABNORMAL LOW (ref 7–16)
BUN: 16 mg/dL (ref 7–18)
CHLORIDE: 107 mmol/L (ref 98–107)
Calcium, Total: 9.1 mg/dL (ref 8.5–10.1)
Co2: 29 mmol/L (ref 21–32)
Creatinine: 0.95 mg/dL (ref 0.60–1.30)
EGFR (Non-African Amer.): >60
Glucose: 92 mg/dL (ref 65–99)
Osmolality: 284 (ref 275–301)
Potassium: 4.5 mmol/L (ref 3.5–5.1)
Sodium: 142 mmol/L (ref 136–145)

## 2014-10-12 LAB — CBC
HCT: 38.7 % (ref 35.0–47.0)
HGB: 12.1 g/dL (ref 12.0–16.0)
MCH: 27.3 pg (ref 26.0–34.0)
MCHC: 31.3 g/dL — ABNORMAL LOW (ref 32.0–36.0)
MCV: 87 fL (ref 80–100)
Platelet: 178 10*3/uL (ref 150–440)
RBC: 4.44 10*6/uL (ref 3.80–5.20)
RDW: 14.4 % (ref 11.5–14.5)
WBC: 7.4 10*3/uL (ref 3.6–11.0)

## 2014-10-12 LAB — PROTIME-INR
INR: 1
Prothrombin Time: 13.3 secs (ref 11.5–14.7)

## 2014-10-12 LAB — APTT: Activated PTT: 25.4 secs (ref 23.6–35.9)

## 2014-10-12 LAB — SEDIMENTATION RATE: Erythrocyte Sed Rate: 25 mm/hr (ref 0–30)

## 2014-10-12 LAB — MRSA PCR SCREENING

## 2014-10-12 NOTE — Telephone Encounter (Signed)
She is on Plavix for TIA/stroke (neurology issue). This clearance will have to come from her neurologist that is managing her TIA/stroke.

## 2014-10-12 NOTE — Telephone Encounter (Signed)
Request for surgical clearance:  1. What type of surgery is being performed? Right total knee   2. When is this surgery scheduled? 10/25/13  3. Are there any medications that need to be held prior to surgery and how long? Plavix and needs held 5 days prior to surgery   4. Name of physician performing surgery? Dr. Marry Guan  5. What is your office phone and fax number? [phone] dr hooten's office

## 2014-10-13 NOTE — Telephone Encounter (Signed)
Spoke w/ Crystal.  Advised her of Ryan's recommendation.  She verbalizes understanding and will call back if we can be of further assistance.

## 2014-10-14 LAB — URINE CULTURE

## 2014-10-25 ENCOUNTER — Inpatient Hospital Stay: Payer: Self-pay | Admitting: General Practice

## 2014-10-26 LAB — HEMOGLOBIN: HGB: 8.9 g/dL — ABNORMAL LOW (ref 12.0–16.0)

## 2014-10-26 LAB — BASIC METABOLIC PANEL
Anion Gap: 4 — ABNORMAL LOW (ref 7–16)
BUN: 13 mg/dL (ref 7–18)
Calcium, Total: 7.8 mg/dL — ABNORMAL LOW (ref 8.5–10.1)
Chloride: 107 mmol/L (ref 98–107)
Co2: 27 mmol/L (ref 21–32)
Creatinine: 1.01 mg/dL (ref 0.60–1.30)
EGFR (African American): >60
GFR CALC NON AF AMER: 57 — AB
GLUCOSE: 112 mg/dL — AB (ref 65–99)
OSMOLALITY: 277 (ref 275–301)
Potassium: 3.8 mmol/L (ref 3.5–5.1)
SODIUM: 138 mmol/L (ref 136–145)

## 2014-10-26 LAB — PLATELET COUNT: Platelet: 133 10*3/uL — ABNORMAL LOW (ref 150–440)

## 2014-10-27 ENCOUNTER — Encounter: Payer: Self-pay | Admitting: Internal Medicine

## 2014-10-27 LAB — URINALYSIS, COMPLETE
BACTERIA: NONE SEEN
Bilirubin,UR: NEGATIVE
Blood: NEGATIVE
GLUCOSE, UR: NEGATIVE mg/dL (ref 0–75)
Nitrite: NEGATIVE
Ph: 5 (ref 4.5–8.0)
Protein: NEGATIVE
RBC,UR: 1 /HPF (ref 0–5)
SPECIFIC GRAVITY: 1.024 (ref 1.003–1.030)
Squamous Epithelial: 1

## 2014-10-27 LAB — PLATELET COUNT: Platelet: 131 10*3/uL — ABNORMAL LOW (ref 150–440)

## 2014-10-27 LAB — BASIC METABOLIC PANEL
ANION GAP: 6 — AB (ref 7–16)
BUN: 12 mg/dL (ref 7–18)
CREATININE: 0.87 mg/dL (ref 0.60–1.30)
Calcium, Total: 7.7 mg/dL — ABNORMAL LOW (ref 8.5–10.1)
Chloride: 107 mmol/L (ref 98–107)
Co2: 28 mmol/L (ref 21–32)
EGFR (African American): >60
GLUCOSE: 109 mg/dL — AB (ref 65–99)
Osmolality: 282 (ref 275–301)
Potassium: 3.6 mmol/L (ref 3.5–5.1)
SODIUM: 141 mmol/L (ref 136–145)

## 2014-10-27 LAB — HEMOGLOBIN: HGB: 8.5 g/dL — ABNORMAL LOW (ref 12.0–16.0)

## 2014-10-29 LAB — HEMOGLOBIN
HGB: 7.4 g/dL — AB (ref 12.0–16.0)
HGB: 8.7 g/dL — AB (ref 12.0–16.0)

## 2014-10-31 LAB — URINALYSIS, COMPLETE
Bilirubin,UR: NEGATIVE
Blood: NEGATIVE
Glucose,UR: NEGATIVE mg/dL (ref 0–75)
NITRITE: POSITIVE
Ph: 7 (ref 4.5–8.0)
Protein: NEGATIVE
Specific Gravity: 1.013 (ref 1.003–1.030)
Squamous Epithelial: NONE SEEN
WBC UR: 1445 /HPF (ref 0–5)

## 2014-11-02 LAB — URINE CULTURE

## 2014-11-15 ENCOUNTER — Encounter: Payer: Self-pay | Admitting: Internal Medicine

## 2014-12-14 ENCOUNTER — Encounter
Admit: 2014-12-14 | Disposition: A | Discharge status: Home / Self Care | Payer: Self-pay | Attending: Internal Medicine | Admitting: Internal Medicine

## 2015-01-14 ENCOUNTER — Encounter
Admit: 2015-01-14 | Disposition: A | Discharge status: Home / Self Care | Payer: Self-pay | Attending: Internal Medicine | Admitting: Internal Medicine

## 2015-01-19 ENCOUNTER — Emergency Department
Admit: 2015-01-19 | Disposition: A | Discharge status: Home / Self Care | Payer: Self-pay | Admitting: Emergency Medicine

## 2015-01-19 LAB — COMPREHENSIVE METABOLIC PANEL
ALBUMIN: 4 g/dL
AST: 28 U/L
Alkaline Phosphatase: 68 U/L
Anion Gap: 7 (ref 7–16)
BUN: 20 mg/dL
Bilirubin,Total: 0.2 mg/dL — ABNORMAL LOW
CHLORIDE: 105 mmol/L
CO2: 28 mmol/L
Calcium, Total: 9.7 mg/dL
Creatinine: 0.83 mg/dL
EGFR (Non-African Amer.): >60
Glucose: 96 mg/dL
Potassium: 4.1 mmol/L
SGPT (ALT): 14 U/L
Sodium: 140 mmol/L
Total Protein: 7.1 g/dL

## 2015-01-19 LAB — CBC WITH DIFFERENTIAL/PLATELET
BASOS PCT: 1.3 %
Basophil #: 0.1 10*3/uL (ref 0.0–0.1)
EOS PCT: 5.4 %
Eosinophil #: 0.3 10*3/uL (ref 0.0–0.7)
HCT: 35.1 % (ref 35.0–47.0)
HGB: 11.1 g/dL — AB (ref 12.0–16.0)
LYMPHS ABS: 2 10*3/uL (ref 1.0–3.6)
LYMPHS PCT: 33.3 %
MCH: 27.1 pg (ref 26.0–34.0)
MCHC: 31.7 g/dL — ABNORMAL LOW (ref 32.0–36.0)
MCV: 86 fL (ref 80–100)
MONOS PCT: 8 %
Monocyte #: 0.5 x10 3/mm (ref 0.2–0.9)
NEUTROS ABS: 3 10*3/uL (ref 1.4–6.5)
Neutrophil %: 52 %
Platelet: 184 10*3/uL (ref 150–440)
RBC: 4.11 10*6/uL (ref 3.80–5.20)
RDW: 14.5 % (ref 11.5–14.5)
WBC: 5.9 10*3/uL (ref 3.6–11.0)

## 2015-01-19 LAB — TROPONIN I

## 2015-02-03 ENCOUNTER — Other Ambulatory Visit: Admit: 2015-02-03 | Disposition: A | Discharge status: Home / Self Care | Payer: Self-pay | Admitting: Family Medicine

## 2015-02-03 LAB — CBC WITH DIFFERENTIAL/PLATELET
Basophil #: 0 10*3/uL (ref 0.0–0.1)
Basophil %: 0.5 %
EOS ABS: 0.4 10*3/uL (ref 0.0–0.7)
EOS PCT: 5.1 %
HCT: 34.6 % — AB (ref 35.0–47.0)
HGB: 11 g/dL — ABNORMAL LOW (ref 12.0–16.0)
Lymphocyte #: 1.7 10*3/uL (ref 1.0–3.6)
Lymphocyte %: 24.2 %
MCH: 27.3 pg (ref 26.0–34.0)
MCHC: 31.8 g/dL — AB (ref 32.0–36.0)
MCV: 86 fL (ref 80–100)
MONO ABS: 0.6 x10 3/mm (ref 0.2–0.9)
Monocyte %: 8.5 %
NEUTROS ABS: 4.4 10*3/uL (ref 1.4–6.5)
Neutrophil %: 61.7 %
Platelet: 187 10*3/uL (ref 150–440)
RBC: 4.04 10*6/uL (ref 3.80–5.20)
RDW: 13.7 % (ref 11.5–14.5)
WBC: 7.1 10*3/uL (ref 3.6–11.0)

## 2015-02-05 NOTE — Discharge Summary (Signed)
PATIENT NAME:  Kayla Gray, BENHAM MR#:  [id number] DATE OF BIRTH:  April 01, 1936  DATE OF ADMISSION:  11/16/2013 DATE OF DISCHARGE:  11/18/2013  ADMISSION DIAGNOSIS: Possible transient ischemic attack.   DISCHARGE DIAGNOSES: 1. Acute cerebrovascular accident of right posterior corona radiata lacunar infarct.  2. History of hypertension.  3. History of aortic stenosis followed by Dr. [name].   CONSULTATIONS: Neurology, Dr. Melrose Nakayama  IMAGING: The patient had an MRI of the brain which showed acute posterior right corona radiata lacunar infarct.   2-D echocardiogram showed normal ejection fraction. It does show some LVH and mild to moderate aortic valve sclerosis and calcification without evidence of stenosis.   Carotid ultrasound showed no evidence of hemodynamically significant stenosis.   HOSPITAL COURSE: An 79 year old female who presented with left leg weakness. Found to have a CVA on MRI. For further details, please refer to the H and P.  1. Right posterior lacunar infarct. The patient was admitted to the hospital to telemetry service. An MRI, echocardiogram and carotid Dopplers were performed, as well as physical therapy consultation. Her MRI was positive for an acute CVA. I suspect this is more related to her blood pressure. Her goal systolic blood pressure within the next couple weeks should be less than 130/80. She was taking aspirin, this is changed to Plavix. She is also on atorvastatin. PT recommended short-term rehab, which the patient was agreeable. 2.  Near syncope secondary to acute cerebrovascular accident. 3.  Hyperlipidemia. The patient is on atorvastatin.  4. Moderate to severe aortic stenosis. Echocardiogram did not show evidence of aortic stenosis;  however, she says that she has aortic stenosis and followed by Dr. [name]. She will continue to follow up with Dr. [name] as outpatient.    DISCHARGE MEDICATIONS: 1. Plavix 75 mg daily.  2. Mobic 7.5 mg daily.  3. Caltrate 1  tablet b.i.d.  4. Paxil 20 mg daily. 5. Multivitamin 1 tablet daily.  6. Cardizem 120 mg daily.  7. Prilosec 20 mg b.i.d.  8. Tylenol 325, 2 tablets q.4 hours p.r.n. pain.  9. Atorvastatin 40 mg daily.   DISCHARGE DIET: Low sodium.   DISCHARGE ACTIVITY: As tolerated.   DISCHARGE FOLLOW-UP: The patient will follow-up with Dr. [name] in one week. The patient is medically stable for discharge.   TIME SPENT: Approximately 35 minutes.   ____________________________ Donell Beers. Benjie Karvonen, MD spm:sg D: 11/18/2013 10:23:00 ET T: 11/18/2013 11:12:07 ET JOB#: [id number]  cc: Minna Merritts, MD Dezarai Prew P. Benjie Karvonen, MD, <Dictator>    Donell Beers Gibril Mastro MD ELECTRONICALLY SIGNED 11/18/2013 13:59

## 2015-02-05 NOTE — Consult Note (Signed)
Referring Physician:  Demetrios Loll :   Primary Care Physician:  Derinda Late E :   Reason for Consult: Admit Date: 16-Nov-2013  Chief Complaint: CVA  Reason for Consult: CVA   History of Present Illness: History of Present Illness:   79 year old woman with OSA and aortic stenosis presents with acute stroke.  Patient reports abrupt onset left leg weakness and numbness.  Because of this she was having difficulty walking.  Specifically she says symptoms started Saturday night when in bed.  She dropped the remote on the ground and got out of bed to pick it up and fell over.  Felt very weak in left leg.  Left arm was wnl.  Right leg was wnl.  Since admission, feels her LLE strength has improved somewhat though still weak compared to opposite limb.  On ASA prior to admission, now on ASA 81 mg daily and Plavix 75 mg daily.  Walked around [employer] with PT today, walked to desk and back.  Still feels a little "dull" in the LLE. MEDICAL HISTORY: OSA on CPAP, hyperlipidemia, depression, breast cancer status post right lumpectomy, osteopenia, iron deficiency anemia, history of recurrent near syncope and syncope with noted nonsustained SVT on Holter, moderate to severe aortic stenosis, bilateral cataracts status post resection, diverticulosis, diverticulitis, osteoarthritis, degenerative joint disease, GERD, POD.  SURGICAL HISTORY: Right knee arthroscopy, cholecystectomy, tonsillectomy, hysterectomy, bilateral cataracts, left knee arthroscopy followed by left knee surgery.   FAMILY HISTORY: Mother died of congestive heart failure at 54.  HISTORY: No smoking, alcohol drinking or illicit drugs.  MEDICATIONS: Paroxetine  20 mg p.o. in the morning.  Multivitamin 1 cap daily.  Mobic 7.5 mg p.o. daily.  Cardizem CD 120 mg 1 cap once a day.  Caltrate with D 600  mg/400 International Units 1 tablet b.i.d.  Aspirin 81 mg p.o. daily.    ALLERGIES: CODEINE, EVISTA, SULFA DRUGS.  ROS:  General denies complaints    HEENT no complaints   Lungs no complaints   Cardiac no complaints   GI no complaints   Neuro no complaints   Endocrine no complaints   Psych no complaints   Past Medical/Surgical Hx:  peptic ulcer disease:   GERD:   depression:   diverticulitis:   murmur:   arrhythmia:   hiatal hernia:   reflux:   arthritis:   anemia:   sleep apnea with cpap:   Breast Cancer:   Hyperlipidemia:   Diverticulitis:   cataract removal to left eye:   cataract removal to right eye:   left total knee replacement:   Lumpectomy: right breast  orthoscopic knee surgery: right knee  Hysterectomy - Partial:   Cholecystectomy:   Tonsillectomy:   Home Medications: Medication Instructions Last Modified Date/Time  Cardizem CD 120 mg/24 hours oral capsule, extended release 1 cap(s) orally once a day 02-Feb-15 21:27  aspirin 81 mg oral tablet 1 tab(s) orally once a day 02-Feb-15 21:27  PriLOSEC 20 mg oral delayed release capsule  orally 2 times a day 02-Feb-15 21:27  Caltrate 600 with D 600 mg-400 intl units tablet 1 tab(s) orally 2 times a day  02-Feb-15 21:27  multivitamin 1 cap(s) orally once a day 02-Feb-15 21:27  PARoxetine 20 mg tablet 1 tab(s) orally once a day (in the morning) 02-Feb-15 21:27  Mobic 7.5 mg oral tablet 1 tab(s) orally once a day 02-Feb-15 21:27   [facility] Current Meds:   Sodium Chloride 0.9%, 1000 ml at 100 ml/hr  Aspirin Enteric Coated tablet, (  Ecotrin)  325 mg Oral daily  - Indication: Pain/Fever/Thromboembolic Disorders/Post MI/Prophylaxis MI  Instructions:  Initiate Bleeding Precautions Protocol--DO NOT CRUSH  atorvaSTATin tablet, 20 mg Oral daily  - Indication: Hypercholesterolemia  Acetaminophen * tablet, ( Tylenol (325 mg) tablet)  650 mg Oral q4h PRN for pain or temp. greater than 100.4  - Indication: Pain/Fever  Ondansetron injection, ( Zofran injection )  4 mg, IV push, q4h PRN for Nausea/Vomiting  Indication: Nausea/ Vomiting  Pantoprazole tablet, 40 mg  Oral daily  - Indication: Erosive Esophagitis/ GERD  Instructions:  DO NOT CRUSH  Enoxaparin injection, ( Lovenox injection )  40 mg, Subcutaneous, <User Schedule> ( every 1 day: 21:00 )  Indication: Prophylaxis or treatment of thromboembolic disorders, Monitor Anticoags per hospital protocol  Diltiazem ER capsule (24HR), ( Cardizem CD)  120 mg Oral daily  - Indication: Angina/ Hypertension  Instructions:  - DO NOT CRUSH  PARoxetine tablet, ( Paxil)  20 mg Oral qam  - Indication: Depression/ Panic/ OCD  Nursing Saline Flush, 3 to 6 ml, IV push, Q1M PRN for IV Maintenance  atorvaSTATin tablet, 40 mg Oral daily  - Indication: Hypercholesterolemia  Clopidogrel tablet,  ( pLAVix)  75 mg Oral daily  Instructions:  Initiate Bleeding Precautions Protocol  Allergies:  Codeine: Rash  Evista: Other  Sulfa drugs: Unknown  Vital Signs: **Vital Signs.:   03-Feb-15 07:29  Vital Signs Type Routine  Temperature Temperature (F) 98.6  Celsius 37  Pulse Pulse 90  Systolic BP Systolic BP 166  Diastolic BP (mmHg) Diastolic BP (mmHg) 88  Mean BP 108  Pulse Ox % Pulse Ox % 94  Pulse Ox Activity Level  At rest  Oxygen Delivery Room Air/ 21 %   EXAM: GENERAL: Pleasant.  NAD.  Normocephalic and atraumatic.  EYES: Funduscopic exam shows normal disc size, appearance and C/D ratio without clear evidence of papilledema.  CARDIOVASCULAR: S1 and S2 sounds are within normal limits, without murmurs, gallops, or rubs.  MUSCULOSKELETAL: Bulk - Normal Tone - Normal Pronator Drift - Absent bilaterally. Ambulation - Gait and station are within normal limits. Romberg - Absent  R/L 5/5    Shoulder abduction (deltoid/supraspinatus, axillary/suprascapular n, C5) 5/5    Elbow flexion (biceps brachii, musculoskeletal n, C5-6) 5/5    Elbow extension (triceps, radial n, C7) 5/5    Finger adduction (interossei, ulnar n, T1)   5/4    Hip flexion (iliopsoas, L1/L2) 5/4    Knee flexion (hamstrings,  sciatic n, L5/S1) 5/4    Knee extension (quadriceps, femoral n, L3/4) 5/4    Ankle dorsiflexion (tibialis anterior, deep fibular n, L4/5) 5/4    Ankle plantarflexion (gastroc, tibial n, S1)  NEUROLOGICAL: MENTAL STATUS: Patient is oriented to person, place and time.  Recent and remote memory are intact.  Attention span and concentration are intact.  Naming, repetition, comprehension and expressive speech are within normal limits.  Patient's fund of knowledge is within normal limits for educational level.  CRANIAL NERVES: Normal    CN II (normal visual acuity and visual fields) Normal    CN III, IV, VI (extraocular muscles are intact) Normal    CN V (facial sensation is intact bilaterally) Normal    CN VII (facial strength is intact bilaterally) Normal    CN VIII (hearing is intact bilaterally) Normal    CN IX/X (palate elevates midline, normal phonation) Normal    CN XI (shoulder shrug strength is normal and symmetric) Normal    CN XII (tongue protrudes midline)  SENSATION: Decreased sensation to all modalities in the LLE.  Otherwise sensation is wnl thoughout.  REFLEXES: R/L 2+/2+    Biceps 2+/2+    Brachioradialis   2+/2+    Patellar 2+/2+    Achilles   COORDINATION/CEREBELLAR: Finger to nose testing is within normal limits..  Lab Results:  LabObservation:  02-Feb-15 17:06   OBSERVATION Reason for Test weakness and numbness in left leg  Routine Chem:  02-Feb-15 12:34   Glucose, Serum 81  BUN 14  Creatinine (comp) 1.03  Sodium, Serum 139  Potassium, Serum 3.7  Chloride, Serum 106  CO2, Serum 29  Calcium (Total), Serum 9.6  Anion Gap  4  Osmolality (calc) 277  eGFR (African American) >60  eGFR (Non-African American)  53 (eGFR values <51m/min/1.73 m2 may be an indication of chronic kidney disease (CKD). Calculated eGFR is useful in patients with stable renal function. The eGFR calculation will not be reliable in acutely ill patients when serum creatinine is  changing rapidly. It is not useful in  patients on dialysis. The eGFR calculation may not be applicable to patients at the low and high extremes of body sizes, pregnant women, and vegetarians.)  03-Feb-15 05:13   Cholesterol, Serum  202  Triglycerides, Serum 159  HDL (INHOUSE) 41  VLDL Cholesterol Calculated 32  LDL Cholesterol Calculated  129 (Result(s) reported on 17 Nov 2013 at 05:51AM.)  Cardiac:  02-Feb-15 12:34   Troponin I < 0.02 (0.00-0.05 0.05 ng/mL or less: NEGATIVE  Repeat testing in 3-6 hrs  if clinically indicated. >0.05 ng/mL: POTENTIAL  MYOCARDIAL INJURY. Repeat  testing in 3-6 hrs if  clinically indicated. NOTE: An increase or decrease  of 30% or more on serial  testing suggests a  clinically important change)  Routine Hem:  02-Feb-15 12:34   WBC (CBC) 7.7  RBC (CBC) 4.66  Hemoglobin (CBC) 12.8  Hematocrit (CBC) 39.6  Platelet Count (CBC)  149  MCV 85  MCH 27.5  MCHC 32.4  RDW 14.2  Neutrophil % 65.7  Lymphocyte % 22.4  Monocyte % 7.8  Eosinophil % 3.3  Basophil % 0.8  Neutrophil # 5.1  Lymphocyte # 1.7  Monocyte # 0.6  Eosinophil # 0.3  Basophil # 0.1 (Result(s) reported on 16 Nov 2013 at 01:06PM.)   Radiology Results:  [place]    03-Feb-15 09:56, [place]  [place] Bilateral   REASON FOR EXAM:    Near syncope, possible CVA  COMMENTS:       PROCEDURE: [place] - [place] DOPPLER BILATERAL  - Nov 17 2013  9:56AM     CLINICAL DATA:  Near syncope.  Possible CVA.    EXAM:  BILATERAL CAROTID DUPLEX ULTRASOUND    TECHNIQUE:  GPearline Cablesscale imaging, color Doppler and duplex ultrasound were  performed of bilateral carotid and vertebral arteries in the neck.    COMPARISON:  None.  FINDINGS:  Criteria: Quantification of carotid stenosis is based on velocity  parameters that correlate the residual internal carotid diameter  with NASCET-based stenosis levels, using the diameter of the distal  internal carotid lumen as the  denominator for stenosis measurement.    The following velocity measurements were obtained:    RIGHT    ICA:  84 cm/sec    CCA:  66 cm/sec    SYSTOLIC ICA/CCA RATIO:  1.3  DIASTOLIC ICA/CCA RATIO:  1.0    ECA:  78 cm/sec    LEFT    ICA:  102 cm/sec  CCA:  73 cm/sec    SYSTOLIC ICA/CCA RATIO:  1.4    DIASTOLIC ICA/CCA RATIO:  1.6    ECA:  78 cm/sec  RIGHT CAROTID ARTERY: The right carotid arteries are patent and  there is no significant plaque. Normal waveforms and velocities  within the right internal carotid artery. The distal right internal  carotid artery is very torturous.    RIGHT VERTEBRAL ARTERY: Antegrade flow and normal waveform in the  right vertebral artery.    LEFT CAROTID ARTERY: Left carotid arteries are patent without  significant plaque. Normal waveforms and velocities in left internal  carotid artery.    LEFT VERTEBRAL ARTERY: Antegradeflow and normal waveform in the  left vertebral artery.   IMPRESSION:  Normal carotid artery duplex examination. No significant  atherosclerotic plaque in the carotid arteries and no evidence for  carotid artery stenosis.      Electronically Signed   By: Markus Daft M.D.    On: 11/17/2013 10:38         Verified By: Burman Riis, M.D.,  MRI:    03-Feb-15 08:42, MRI Brain Without Contrast  MRI Brain Without Contrast   REASON FOR EXAM:    CVA  COMMENTS:       PROCEDURE: MR  - MR BRAIN WO CONTRAST  - Nov 17 2013  8:42AM     CLINICAL DATA:  Left-sided weakness and confusion for 3 days. Fall.  Evaluate for strain.    EXAM:  MRI HEAD WITHOUT CONTRAST    TECHNIQUE:  Multiplanar, multiecho pulse sequences of the brain and surrounding  structures were obtained without intravenous contrast.  COMPARISON:  Head CT 11/16/2013    FINDINGS:  There is a 1 cm acute infarct involving the posterior right corona  radiata. There is no intracranial hemorrhage. Foci of T2  hyperintensity in the periventricular white  matter are nonspecific  but compatible with mild chronic small vessel ischemic disease.  There is mild cerebral atrophy. There is no evidence of mass,  midline shift, or extra-axial fluid collection. Small, remote  bilateral cerebellar infarcts are noted.    Distal right vertebral artery flow void is not identified. Major  intracranial vascular flow voids are otherwise unremarkable. Prior  bilateral cataract surgery is noted. There is mild bilateral ethmoid  air cell mucosal thickening. Small amount of fluid is present within  both maxillary sinuses. Mucous retention cysts are also noted in the  maxillary sinuses.     IMPRESSION:  1. Acute posterior right corona radiata lacunar infarct.  2. Mild chronic small vessel ischemic disease and cerebral atrophy.  3. Small, remote bilateral cerebellar infarcts.  4. Distal right vertebral artery flow void is not identified. Right  vertebral artery may be hypoplastic or occluded proximally.      Electronically Signed    By: Logan Bores    On: 11/17/2013 08:52     Verified By: Ferol Luz, M.D.,  CT:    02-Feb-15 12:50, CT Head Without Contrast  CT Head Without Contrast   REASON FOR EXAM:    weakness in left leg, unable to walk  COMMENTS:   LMP: Post Hysterectomy    PROCEDURE: CT  - CT HEAD WITHOUT CONTRAST  - Nov 16 2013 12:50PM     CLINICAL DATA:  Left leg numbness.    EXAM:  CT HEAD WITHOUT CONTRAST    TECHNIQUE:  Contiguous axial images were obtained from the base of the skull  through the vertex without  contrast.    COMPARISON:  05/27/2011  FINDINGS:  No evidence for acute hemorrhage, mass lesion, midline shift,  hydrocephalus or large infarct. There are possible fluid levels in  the maxillary sinuses bilaterally. Mild mucosal disease in the  ethmoid air cells. No acute bone abnormality.     IMPRESSION:  No acute intracranial abnormality.    Possible fluid in the maxillary sinuses bilaterally. Findings  are  nonspecific but can be associated with acute sinusitis. There was  sinus disease in the maxillary sinuses on the prior examination.      Electronically Signed    By: Markus Daft M.D.    On: 11/16/2013 12:57         Verified By: Burman Riis, M.D.,   Impression/Recommendations: Recommendations:   79 year old woman with OSA and aortic stenosis presents with acute stroke.   with acute stroke, uncertain cause.  Monitor for afib.  Agree with incrased to ASA 81 mg daily plus Plavix 75 mg daily given stroke on ASA only.  PT and OT for LLE weakness and numbness.  ST not needed, no speech deficits or dysarthria.  Advised no smoking.  Discussed risk factors including HTN, HLP, DM.  Aortic stenosis can sometimes results in cardioembolic phenomenon which is certainly possible in this case given the appearance of a lacunar infarct.     have reviewed the results of the most recent imaging studies, tests and labs as outlined above and answered all related questions. have personally viewed the patient's Brain MRI and it shows acute right corona radiata ischemic infarct, small. and coordinated plan of care with hospitalist.   Electronic Signatures: Anabel Bene (MD)  (Signed 04-Feb-15 01:41)  Authored: REFERRING PHYSICIAN, Primary Care Physician, Consult, History of Present Illness, Review of Systems, PAST MEDICAL/SURGICAL HISTORY, HOME MEDICATIONS, Current Medications, ALLERGIES, NURSING VITAL SIGNS, Physical Exam-, LAB RESULTS, RADIOLOGY RESULTS, Recommendations   Last Updated: 04-Feb-15 01:41 by Anabel Bene (MD)

## 2015-02-05 NOTE — H&P (Signed)
PATIENT NAME:  Kayla Gray, WIEMANN MR#:  [id number] DATE OF BIRTH:  07-31-1936  DATE OF ADMISSION:  11/16/2013  PRIMARY CARE PHYSICIAN:  Dr. Baldemar Lenis.  REFERRING PHYSICIAN:  Dr. Karma Greaser.  CHIEF COMPLAINT: Left leg weakness and numbness for 2 days.   HISTORY OF PRESENT ILLNESS: A 79 year old Caucasian female with a history of hyperlipidemia, OSA, moderate to severe aortic stenosis, presented to the ED with the above chief complaint. The patient is alert, awake, oriented, in no acute distress. The patient said for the past 2 days she has had left leg weakness, numbness. She cannot walk properly. In addition, the patient almost passed out today and 2 days ago. Also, the patient has some palpitations, but denies any chest pain, orthopnea or nocturnal dyspnea. No leg edema. The patient denies any loss of consciousness. No seizure. No slurred speech, dysphagia or incontinence.   PAST MEDICAL HISTORY: OSA on CPAP, hyperlipidemia, depression, breast cancer status post right lumpectomy, osteopenia, iron deficiency anemia, history of recurrent near syncope and syncope with noted nonsustained SVT on Holter, moderate to severe aortic stenosis, bilateral cataracts status post resection, diverticulosis, diverticulitis, osteoarthritis, degenerative joint disease, GERD, POD.   PAST SURGICAL HISTORY: Right knee arthroscopy, cholecystectomy, tonsillectomy, hysterectomy, bilateral cataracts, left knee arthroscopy followed by left knee surgery.   SOCIAL HISTORY: No smoking, alcohol drinking or illicit drugs.   FAMILY HISTORY: Mother died of congestive heart failure at 3.   ALLERGIES: CODEINE, EVISTA, SULFA DRUGS.  HOME MEDICATIONS: 1.  Paroxetine  20 mg p.o. in the morning.  2.  Multivitamin 1 cap daily.  3.  Mobic 7.5 mg p.o. daily.  4.  Cardizem CD 120 mg 1 cap once a day.  5.  Caltrate with D 600  mg/400 International Units 1 tablet b.i.d.  6.  Aspirin 81 mg p.o. daily.     REVIEW OF SYSTEMS:    CONSTITUTIONAL: The patient denies any fever or chills. No headache or dizziness, but has near syncope and weakness.  EYES:  No double vision, blurred vision. ENT:  No postnasal drip, slurred speech or dysphagia.  CARDIOVASCULAR: No chest pain, palpitation, orthopnea, nocturnal dyspnea. No leg edema.  PULMONARY: No cough, sputum, shortness of breath or hemoptysis.  GASTROINTESTINAL: No abdominal pain, nausea, vomiting, diarrhea. No melena or bloody stool.  GENITOURINARY: No dysuria, hematuria or incontinence.  SKIN: No rash or jaundice.  NEUROLOGY: Left lower extremity weakness and numbness. No slurred speech or dysphagia. No incontinence. No loss of consciousness, but had near syncope twice.  HEMATOLOGY: No easy bruising or bleeding.  ENDOCRINE: No polyuria, polydipsia, heat or cold intolerance.  SKIN: No rash or jaundice.   PHYSICAL EXAMINATION: VITAL SIGNS: Temperature 98, blood pressure 158/91, pulse 58. Oxygen saturation 98% on room air.  GENERAL: The patient is alert, awake, oriented, in no acute distress.  HEENT: Pupils round, equal and reactive to light and accommodation. Moist oral mucosa.  Clear oropharynx. NECK: Supple. No JVD or carotid bruits. No lymphadenopathy. No thyromegaly.  CARDIOVASCULAR: S1, S2, regular rate and rhythm. Systolic murmur. No gallop.  PULMONARY: Bilateral air entry. No wheezing or rales. No use of accessory muscles to breathe.  ABDOMEN: Soft. No distention or tenderness. No organomegaly. Bowel sounds present.  EXTREMITIES: No edema, clubbing or cyanosis. No calf tenderness. Bilateral pedal pulses present. NEUROLOGIC: A and O x 3. Left lower extremity weakness, power 3/6; right side 4/5. Sensation intact. No slurred speech or dysphagia. DTR:  Left side about 3+, right side about 1+.   IMAGING AND  LABORATORY DATA: Left lower extremity venous duplex did not show any deep vein thrombosis. LEFT KNEE X-RAY: No acute abnormality.  CAT SCAN OF HEAD:  Negative. No  acute intracranial abnormality.   CBC: All in normal range.   Glucose 81, BUN 14, creatinine 1.03, electrolytes normal. Troponin less than 0.02. EKG shows sinus rhythm at 64 bpm.   IMPRESSIONS: 1.  Possible cerebrovascular accident.  2.  Near syncope.  3.  Hyperlipidemia.  4.  Moderate to severe aortic stenosis.   PLAN OF TREATMENT: 1.  The patient will be placed for observation. We will get an MRI of the brain, echocardiograph and carotid duplex and check lipid panel.  2.  We will increase aspirin to 325 mg p.o. daily and start statin.  3.  Physical therapy evaluation.  4.  Continue the patient's other home medications.   I discussed the patient's condition and plan of treatment with the patient and the patient's daughter.   The patient wants DNR status.   TIME SPENT: About 52 minutes.    ____________________________ Demetrios Loll, MD qc:dmm D: 11/16/2013 21:38:44 ET T: 11/16/2013 22:43:30 ET JOB#: [id number]  cc: Demetrios Loll, MD, <Dictator> Demetrios Loll MD ELECTRONICALLY SIGNED 11/18/2013 15:28

## 2015-02-06 NOTE — H&P (Signed)
PATIENT NAME:  Kayla Gray, Kayla Gray MR#:  [id number] DATE OF BIRTH:  1936/06/18  DATE OF ADMISSION:  11/17/2011  REFERRING PHYSICIAN: Arman Filter, MD  PRIMARY CARE PHYSICIAN: Apolonio Schneiders, MD  PRIMARY CARDIOLOGIST: Isaias Cowman, MD  PRESENTING COMPLAINT: Heart racing and chest pressure.   HISTORY OF PRESENT ILLNESS: [name] is a 79 year old woman with history of obstructive sleep apnea, hyperlipidemia, depression, breast cancer status post right lumpectomy, iron deficiency anemia, and peptic ulcer disease who was recently discharged on 11/15/2011 after left knee total replacement to rehab at [facility]. She presents today with reports of developing palpitations. The patient has a history of nonsustained SVT as well as aortic stenosis. She reports that after getting physical therapy today she had an episode of heart racing that lasted for only two minutes associated with shortness of breath. Then this evening she had recurrent episode of her heart racing, but this time it was more severe than her usual episodes. She reports  heart racing faster and lasting longer. By the time EMS arrived, it had already been racing for two hours. She reports associated chest pressure and some nausea, but no presyncope or syncope. In the past, she had initial evaluation for near syncope/syncopal episode and had a Holter monitor which revealed that she had nonsustained supraventricular tachycardia, done by Dr. Saralyn Pilar. She had a stress echocardiogram in August 2012 that was negative for ischemia and also an echocardiogram that showed an ejection fraction of greater than 55% with mild to moderate left ventricular hypertrophy, mild aortic insufficiency, and moderate to severe aortic stenosis. The patient currently is symptom free. She received adenosine on site via EMS and when she arrived here she received diltiazem and Lopressor. The patient responded to the second diltiazem dose.   PAST MEDICAL HISTORY:   1. Obstructive sleep apnea, on CPAP.  2. Hyperlipidemia.  3. Depression.  4. Breast cancer, status post right lumpectomy in 1999.  5. Osteopenia.  6. Iron deficiency anemia.  7. History of recurrent near syncope and syncope with noted nonsustained supraventricular tachycardia on Holter.  8. Moderate to severe aortic stenosis.  9. Bilateral cataracts status post resection.  10. Diverticulosis/diverticulitis.  11. Osteoarthritis/degenerative joint disease.  12. Gastroesophageal reflux disease.  13. Peptic ulcer disease.   PAST SURGICAL HISTORY:  1. Right knee arthroscopy.  2. Cholecystectomy.  3. Tonsillectomy.  4. Hysterectomy.  5. Bilateral cataracts.  6. Left knee arthroscopy followed by left knee surgery in January 2013.   ALLERGIES: Codeine, Evista, sulfa drugs.   MEDICATIONS:  1. Bisac-Evac 10 mg suppository daily as needed.  2. Cepacol lozenges one lozenge every four hours as needed for cough and sore throat.  3. GNP Allergy 25 mg every six hours as needed.  4. My-Acid 30 mL every 6 hours p.r.n.  5. Milk of Magnesia 30 mL twice a day as needed.  6. MiraLax one packet in 8 ounces of fluid daily as needed.  7. Oxycodone 5 mg to 10 mg every four hours as needed.  8. Soapsuds enema as needed.  9. Incentive spirometry every one hour while awake.  10. Tramadol 50 to 100 mg every four hours as needed.  11. Tylenol 500 to 1000 mg every four hours as needed.  12. Calcium plus D 1 tablet twice a day.  13. Celebrex 200 mg twice a day.  14. Cetirizine 10 mg daily.  15. Fluticasone nasal spray two sprays to each naris daily.  16. Lovenox 40 mg subcutaneous daily.  17. Omeprazole 20 mg  daily.  18. Paroxetine 20 mg daily.  19. Senna Plus 8.6/50 mg 1 tablet twice a day. 20. Tab-A-Vite 1 tablet every day.   FAMILY HISTORY: Mother died of congestive heart failure at 19.   SOCIAL HISTORY: She is currently receiving rehab at [facility]. She denies any tobacco, alcohol, or drug use.    REVIEW OF SYSTEMS: CONSTITUTIONAL: No fevers or chills. She reports nausea with her palpitations. EYES: History of cataracts. ENT: No epistaxis or discharge. RESPIRATORY: No cough, wheezing, or hemoptysis. Reports shortness of breath with the earlier episode of palpitations but not with the recurrence. CARDIOVASCULAR: As per history of present illness. GASTROINTESTINAL: Endorses nausea. No vomiting or diarrhea. She actually reports increased bowel movement about five times per day but formed stool. No abdominal pain, hematemesis, or melena. GU: No dysuria or hematuria. ENDOCRINE: No polyuria or polydipsia. HEMATOLOGIC: No easy bleeding. SKIN: She developed a rash on her back from the codeine that she received in the hospital. MUSCULOSKELETAL: She has mid and low back pain and left knee pain, but it has improved. NEUROLOGIC: No one-sided weakness or numbness. PSYCH: Denies any depression or suicidal ideation.   PHYSICAL EXAMINATION:   VITAL SIGNS: Temperature 97.2, pulse initially 110 with peak in the 180s, current pulse in the 80s in sinus, respiratory rate 20, blood pressure 123/68, and saturation 99% on room air.   GENERAL: Lying in bed in no apparent distress.   HEENT: Normocephalic, atraumatic. Pupils equal and symmetric, anicteric. Nares without discharge.   NECK: Soft and supple. No adenopathy. No JVP.   CARDIOVASCULAR: Non-tachy and regular. She has a systolic murmur of 3+. No rubs or gallops.   LUNGS: Clear to auscultation bilaterally. No use of accessory muscles or increased respiratory effort.   ABDOMEN: Soft. Positive bowel sounds. No mass appreciated.   EXTREMITIES: 2+ pitting edema of the left lower extremity. Dorsal pedis pulses intact.   MUSCULOSKELETAL: There is some swelling around the left knee. No spinal or CVA tenderness. She has paraspinal tenderness on palpation of her back.   NEUROLOGIC: No dysarthria or aphasia. Symmetrical strength of upper extremities. No focal  deficits.  PSYCH: She is alert and oriented. The patient is cooperative.   PERTINENT LABS AND STUDIES: WBC 6.5, hemoglobin 9.2, hematocrit 27.7, platelets 122, MCV 86. Troponin 0.44. CK 177, MB 2.1, PTT 35.4. Glucose 129, BUN 15, creatinine 0.9, sodium 143, potassium 3.4, chloride 109, carbon dioxide 25, calcium 8.4, total bilirubin 0.5, alkaline phosphatase 56, ALT 29, AST 44, total protein 6.1, albumin 2.8. INR 1. Magnesium 1.8.   EKG on arrival with sinus tachycardia, rate of 110. No ST depression or elevation. T-wave inversion in aVR and V1. Her rhythm strips from EMS with heart rate of 171, appears to be also sinus, but noted ST depression in lead V2, V3, V4, V5, and V6.   Chest x-ray without any acute findings.   ASSESSMENT AND PLAN: Ms. Sweigert is a 79 year old woman with history of aortic stenosis, nonsustained supraventricular tachycardia, degenerative joint disease, obstructive sleep apnea, and iron deficiency anemia presenting from rehab with palpitations and chest pressure. 1. Sustained supraventricular tachycardia and chest pressure likely in the setting of the SVT and elevated troponin likely with demand ischemia. Her stress echocardiogram as well as echocardiogram results are as dictated above. Rhythm strip does show some ST depression in the setting of the SVT. Recent EKG again without any ST changes. She received adenosine on site and received diltiazem and Lopressor here with response to the diltiazem. We  will continue on low-dose diltiazem and continue on telemetry. Cycle cardiac enzymes. We will send TSH. Her magnesium is within normal limits. We will send urinalysis and left lower extremity Doppler given her reports of worsening left lower extremity edema. Her potassium has been replaced. We will restart her aspirin. The patient was not receiving aspirin due to getting Lovenox at [facility]. We will also obtain cardiology consultation and hold on additional diagnostics until evaluated by  cardiology.  2. Iron deficiency anemia. Hemoglobin has been stable since her discharge.  3. Thrombocytopenia, likely medication induced. Platelets from [facility] labs showed normal level to on the lower side of normal. The patient on Lovenox for prophylaxis. We will continue to follow her blood count.  4. Obstructive sleep apnea. Continue CPAP.  5. Dyslipidemia. The patient is not on cholesterol medication.  6. Prophylaxis with Lovenox, aspirin, and omeprazole.   TIME SPENT: Approximately 50 minutes was spent on patient care.  ____________________________ Rita Ohara, MD ap:slb D: 11/17/2011 04:18:00 ET T: 11/17/2011 09:45:21 ET JOB#: [id number]  cc: Brien Few Kelis Plasse, MD, <Dictator> Vianne Bulls. Arline Asp, MD Rita Ohara MD ELECTRONICALLY SIGNED 12/11/2011 0:33

## 2015-02-06 NOTE — Discharge Summary (Signed)
PATIENT NAME:  Kayla Gray, WERNTZ MR#:  [id number] DATE OF BIRTH:  29-Dec-1935  DATE OF ADMISSION:  11/17/2011 DATE OF DISCHARGE:  11/18/2011  PRIMARY CARE PHYSICIAN: Apolonio Schneiders, MD   CARDIOLOGIST: Isaias Cowman, MD    DISCHARGE DIAGNOSES:  1. Supraventricular tachycardia.  2. Elevated troponin.  3. Anemia.  4. Thrombocytopenia.  5. Sleep apnea.  6. Recent left knee replacement.  7. Allergies.  8. Depression.  9. Gastroesophageal reflux disease.   MEDICATIONS ON DISCHARGE:  1. Caltrate Plus D b.i.d. 2. Paxil 20 mg daily.  3. Omeprazole 20 mg daily.  4. Tylenol 1 to 2 tablets every 4 hours as needed for pain.  5. Multivitamin 1 tablet daily.  6. Zyrtec 10 mg daily.  7. Aspirin 81 mg every morning. 8. Mobic 15 mg every night. 9. Lovenox 30 mg subcutaneous injection b.i.d. for 12 more days and stop.  10. Tramadol 50 mg every 6 hours as needed for pain.  11. Ferrous sulfate 325 mg p.o. daily.  12. Cardizem CD 120 mg p.o. daily.  13. CPAP at night for sleep apnea.   REASON FOR ADMISSION: The patient was admitted 11/17/2011 and discharged 11/18/2011. The patient came in with heart racing, chest pressure.  HISTORY/HOSPITAL COURSE: The patient is a 79 year old woman with sleep apnea, hyperlipidemia, depression, breast cancer, anemia, peptic ulcer disease, recently discharged 11/15/2011 after a left total knee replacement. She went to rehab and started developing palpitations, had nonsustained supraventricular tachycardia. She received adenosine, diltiazem and Lopressor. She was admitted with supraventricular tachycardia, found to have an elevated troponin, most likely demand ischemia with a tachycardia. She was started on low-dose diltiazem and aspirin. Cardiology, Dr. Ubaldo Glassing, saw the patient, who ordered a CT scan of the chest to rule out PE. The elevated troponin was secondary to supraventricular tachycardia and not an ischemic event, and she was believed stable for discharge.    HOSPITAL LABORATORY, DIAGNOSTIC AND RADIOLOGICAL DATA:  TSH 1.93. Magnesium 1.8. INR 1.0.  Glucose 129, BUN 15, creatinine 0.90, sodium 143, potassium 3.4, chloride 109, CO2 25, calcium 8.4.  Liver function tests: AST slightly elevated at 44, albumin low at 2.8. Troponin of 0.44.  White blood cell count 6.5, hemoglobin and hematocrit 9.2 and 27.7, platelet count of 122.  Chest x-ray showed mild bilateral interstitial thickening. Ultrasound of the lower extremities showed no deep vein thrombosis in the left lower extremity. A 3.9 x 2.2 x 2.2 cm popliteal fossa fluid collection may represent postsurgical seroma versus a Baker's cyst.  Troponin went up to 4.2 and down to 2.1.  CT scan of the chest showed no evidence of pulmonary embolism. Lungs are clear.  Hemoglobin upon discharge was 8.7 and platelets 140.   PROBLEM LIST:  1. Supraventricular tachycardia: For the patient's supraventricular tachycardia, the patient's heart rate was controlled and was in normal sinus rhythm. She was given Cardizem CD 120 mg to go home with.  2. Elevated troponin: The patient was seen in consultation by Dr. Ubaldo Glassing, who believed the elevation of troponin was secondary to the supraventricular tachycardia. No cardiac intervention was done or planned at this time. Dr. Ubaldo Glassing felt that the patient was stable for discharge home since she did not have any chest pain or shortness of breath. The patient was given an aspirin.  3. Anemia: The patient was given ferrous sulfate upon discharge.  4. Thrombocytopenia: Platelets are lower side of normal. Continue to watch as outpatient.  5. Sleep apnea: Continue CPAP.  6. Recent left knee replacement:  The patient was interested in going home and not back to rehabilitation. She was seen in consultation by Physical Therapy, who recommended home with Home Health. This was set up via the Care Manager. She was given Lovenox injections to go home with for 12 more days, then stop.   7. Allergies: She will continue Zyrtec.  8. Depression: She is on Paxil.  9. Gastroesophageal reflux disease: She is on omeprazole.   FOLLOW UP:  1. She will keep follow-up appointments with Dr. Marry Guan for postsurgical evaluation for the left knee replacement.  2. She has Home Health and Home PT.  3. Follow up with Dr. Saralyn Pilar in 3 to 4 weeks.  4. Keep appointment with Dr. Arline Asp next week.   TIME SPENT ON DISCHARGE: 35 minutes.   ____________________________ Tana Conch. Leslye Peer, MD rjw:cbb D: 11/18/2011 15:50:28 ET T: 11/20/2011 10:24:56 ET JOB#: [id number]  cc: Tana Conch. Leslye Peer, MD, <Dictator> Laurice Record. Holley Bouche., MD Isaias Cowman, MD Vianne Bulls. Arline Asp, MD Marisue Brooklyn MD ELECTRONICALLY SIGNED 12/01/2011 16:40

## 2015-02-06 NOTE — Consult Note (Signed)
Brief Consult Note: Diagnosis: svt with chest pain.   Recommend further assessment or treatment.   Discussed with Attending MD.   Comments: Pt with history of svt now with prolonged episode of svt. She is post op left tkr. Mild lower extremety edema. No evidence of pulmonary embolus or dvt by ct and ultrasound. Mild elevated troponin appears to be secondary to svt and not acute ischemic event. Would continue with cardizem for rate control and consider transfer back to rehab facility vs home if cleared by pt. Full nte to follow.  Electronic Signatures: Teodoro Spray (MD)  (Signed [phone] 16:01)  Authored: Brief Consult Note   Last Updated: 02-Feb-13 16:01 by Teodoro Spray (MD)

## 2015-02-06 NOTE — Discharge Summary (Signed)
PATIENT NAME:  Kayla Gray, CANNAN MR#:  [id number] DATE OF BIRTH:  November 21, 1935  DATE OF ADMISSION:  11/12/2011 DATE OF DISCHARGE:  11/15/2011  ADMITTING DIAGNOSIS: Degenerative arthrosis of the left knee.   DISCHARGE DIAGNOSIS: Degenerative arthrosis of the left knee.   HISTORY OF PRESENT ILLNESS: The patient is a 79 year old female who has been followed at [facility] for progression of bilateral knee pain with the left being more symptomatic than the right. She had reported a several-year history of bilateral knee pain. She had localized most of the pain along the medial aspect of the left knee. Her pain was noted to be aggravated with weight-bearing activities. The patient had previously undergone a left knee arthroscopy for partial medial and lateral meniscectomy as well as chondroplasty in April 2012. The patient was noted that time to have significant chondromalacia. The patient did not see any significant improvement in her condition despite cortisone injections, Synvisc injections, as well as Mobic. She had gotten to the point where she was having difficulty with everyday activities. She was not using any type of ambulatory aid at the time of surgery. X-rays taken in the clinic showed narrowing of the medial cartilage space with associated varus alignment. She was noted to have bone-on-bone articulation noted medially. Osteophytes as well as subchondral sclerosis were noted. After discussion of the risks and benefits of surgical intervention, the patient expressed her understanding of the risks and benefits and agreed with plans for surgical intervention.   PROCEDURE: Left total knee arthroplasty using computer-assisted navigation.   ANESTHESIA: Femoral nerve block with spinal.   SOFT TISSUE RELEASE: Anterior cruciate ligament, posterior cruciate ligament, deep medial collateral ligaments, as well as the patellofemoral ligament.   IMPLANTS UTILIZED: DePuy PFC Sigma size three posterior  stabilized femoral component (cemented), size 2.5 MBT tibial component (cemented), 32-mm three pegged oval dome patella (cemented), and a 12.5-mm stabilized rotating platform polyethylene insert.   HOSPITAL COURSE: The patient tolerated the procedure very well. She had no complications. She was then taken to the PAC-U where she was stabilized and then transferred to the orthopedic floor. She began receiving anticoagulation therapy of Lovenox 30 mg subcutaneous every 12 hours per anesthesia protocol. She was fitted with TED stockings bilaterally. These were allowed to be removed one hour per eight-hour shift. The left one was applied on day two following removal of the Hemovac and dressing change. She was also fitted with the AV-I compression foot pumps bilaterally set at 130 mmHg. Her calves have been nontender, free of any evidence of any deep venous thromboses. Negative Homans sign. Heels were elevated off the bed using rolled towels.   The patient has denied any chest pain or shortness of breath. Vital signs have been stable. She has been afebrile. Hemodynamically she was stable. No transfusions were given other than the Autovac transfusion given the first six hours postoperatively.   The patient's IV, Foley, and Hemovac were all discontinued on day two along with a dressing change. The Polar Care was reapplied to the surgical leg maintaining a temperature of 40 to 50 degrees Fahrenheit.   Physical therapy was initiated on day one for gait training and transfers. On day one she did well with range of motion but ambulated only 14 feet. However, on day two her range of motion and ambulation had decreased significantly. She was noted to have increased pain. The pain, however, was not to the right leg but to the nonoperative leg. Subsequently she was placed on Celebrex and  this seemed to improve her pain and allowed her to improve her therapy. Occupational therapy was also initiated on day one for activities  of daily living and assistive devices.   DISPOSITION: The patient is being discharged to skilled nursing facility in improved stable condition.   DISCHARGE INSTRUCTIONS:  1. She will continue physical therapy for gait training and transfers, occupational therapy for activities of daily living and assistive devices.  2. She may weight bear as tolerated.  3. Continue TED stockings bilaterally. These are allowed to be removed one hour per eight-hour shift.  4. Incentive spirometer q.1 hour while awake.  5. Encourage cough with deep breathing every two hours while awake.  6. Polar Care to the surgical leg maintaining a temperature of 40 to 50 degrees Fahrenheit.  7. She is placed on a regular diet.  8. She has a follow-up appointment with Vance Peper, PA on 02/12 and 03/12 with Dr. Skip Estimable. She is to call the clinic sooner for any temperatures of 101.5 or greater or excessive bleeding.  9. She may use her CPAP ventilation that she has home.    DRUG ALLERGIES: Codeine, Evista, sulfa drugs.   MEDICATIONS:  1. Tylenol ES 500 to 1000 mg every four hours p.r.n.  2. Roxicodone 5 to 10 mg every four hours p.r.n.  3. Tramadol 50 to 100 mg every four hours p.r.n.  4. Multivitamin 1 capsule daily.  5. Paxil 20 mg daily.  6. Citracal plus D 1 tablet b.i.d. 7. MiraLAX powder 17 grams orally p.r.n. for constipation.  8. Prilosec 20 mg q. 6 a.m. 9. Zyrtec 10 mg daily.  10. Flonase nasal spray, two sprays both nostrils daily.  11. Dulcolax suppository 10 mg rectally p.r.n. for constipation. 12. Milk of Magnesia 30 mL b.i.d. p.r.n.  13. Enema soapsuds if no results with Milk of Magnesia, Dulcolax. 14. Mylanta DS 30 mL q. 6 hours p.r.n.  15. Senokot-S 1 tablet b.i.d.  16. Lovenox 30 mg subcutaneous q.12 hours for 14 days, then discontinue and begin taking one 81- mg enteric-coated aspirin per day.  17. Cepacol lozenges, one lozenge q. 4 hours p.r.n.  18. Celebrex 200 mg b.i.d. 19. Benadryl 25 mg q.  6 hours p.r.n.   PAST MEDICAL HISTORY:  1. Sleep apnea.  2. Hypercholesterolemia.  3. Depression.  4. Cataracts bilateral eyes.  5. Breast cancer 1999.  6. History of iron deficiency anemia.  7. Diverticulosis. 8. Osteoarthritis.  9. Gastroesophageal reflux disease. 10. Peptic ulcer disease.    ____________________________ Vance Peper, PA jrw:bjt D: 11/15/2011 08:01:15 ET T: 11/15/2011 08:31:31 ET JOB#: [id number]  cc: Vance Peper, PA, <Dictator> Ambrosio Reuter PA ELECTRONICALLY SIGNED 11/16/2011 7:58

## 2015-02-06 NOTE — Op Note (Signed)
PATIENT NAME:  Kayla Gray, FURNO MR#:  [id number] DATE OF BIRTH:  April 08, 1936  DATE OF PROCEDURE:  11/12/2011  PREOPERATIVE DIAGNOSIS: Degenerative arthrosis of the left knee.   POSTOPERATIVE DIAGNOSIS: Degenerative arthrosis of the left knee.   PROCEDURE PERFORMED: Left total knee arthroplasty using computer-assisted navigation.   SURGEON: Laurice Record. Holley Bouche., MD  ASSISTANT: Vance Peper, PA-C (required to maintain retraction throughout the procedure)   ANESTHESIA: Femoral nerve block and spinal.   ESTIMATED BLOOD LOSS: 100 mL.   FLUIDS REPLACED: 1100 mL of crystalloid.   TOURNIQUET TIME: 75 minutes.   DRAINS: Two medium drains to reinfusion system.   SOFT TISSUE RELEASES: Anterior cruciate ligament, posterior cruciate ligament, deep medial collateral ligament, and patellofemoral ligament.   IMPLANTS UTILIZED: DePuy PFC Sigma size 3 posterior stabilized femoral component (cemented), size 2.5 MBT tibial component (cemented), 32 mm three peg oval dome patella (cemented), and a 12.5 mm stabilized rotating platform polyethylene insert.   INDICATIONS FOR SURGERY: The patient is a 79 year old female who has been seen for complaints of progressive left knee pain. X-rays demonstrated severe degenerative changes in a tricompartmental fashion with relative varus deformity. After discussion of the risks and benefits of surgical intervention, the patient expressed her understanding of the risks and benefits and agreed with plans for surgical intervention.   PROCEDURE IN DETAIL: Patient was brought into the Operating Room and, after adequate femoral nerve block and spinal anesthesia was achieved, tourniquet was placed on patient's upper left thigh. Patient's left knee and leg were cleaned and prepped with alcohol and DuraPrep, draped in the usual sterile fashion. A "timeout" was performed as per usual protocol. The left lower extremity was exsanguinated using Esmarch, and the tourniquet was inflated to  300 mmHg. Anterior longitudinal incision was made followed by a standard mid vastus approach. Moderate effusion was evacuated. The deep fibers of the medial collateral ligament were elevated in subperiosteal fashion off the medial flare of the tibia so as to maintain a continuous soft tissue sleeve. Patella was subluxed laterally and the patellofemoral ligament was incised. Inspection of the knee demonstrated severe degenerative changes in a tricompartmental fashion with evidence of eburnated bone to the medial compartment. Prominent osteophytes were debrided using rongeur. Anterior and posterior cruciate ligaments were excised. Two 4.0 mm Schanz pins were inserted into the femur and into the tibia for attachment of the array of spheres used for computer-assisted navigation. Hip center was identified using circumduction technique. Distal landmarks were mapped using computer. Distal femur and proximal tibia were mapped using the computer. Distal femoral cutting guide was positioned using computer-assisted navigation so as to achieve 5 degrees distal valgus cut. Cut was performed and verified using the computer. Distal femur was sized and it was felt that a size 3 femoral component was appropriate. Size 3 cutting guide was positioned using computer-assisted navigation and the anterior cut was performed and verified using computer. This was followed by completion of the posterior and chamfer cuts. Femoral cutting guide for the central box was then positioned and central box cut was performed. Attention was then directed to the proximal tibia. Medial and lateral menisci were excised. The extramedullary tibial cutting guide was positioned using computer-assisted navigation so as to achieve 0 degrees varus valgus alignment and 0 degrees posterior slope. Cut was performed and verified using the computer. Proximal tibia was sized and it was felt that a size 2.5 tibial tray was appropriate. Tibial and femoral trials were  inserted followed by insertion of first a  10 and subsequently a 12.5 mm polyethylene insert. Excellent mediolateral soft tissue balancing was appreciated both in full extension and in 90 degrees of flexion. Finally, the patella was cut and prepared so as to accommodate a 32 mm three peg oval dome patella. Patellar trial was placed and the knee was placed through a range of motion with excellent patellar tracking appreciated. Femoral component was removed. Central post hole for the tibial component was reamed followed by insertion of a keel punch. Tibial trial was then removed. Cut surfaces of bone were irrigated with copious amounts of normal saline with antibiotic solution using pulsatile lavage and then suctioned dry. Polymethyl methacrylate cement was prepared in the usual fashion using vacuum mixer. Cement was applied to the cut surface of the proximal tibia as well as along the undersurface of a size 2.5 MBT tibial component. The tibial component was positioned and impacted in place. Excess cement was removed using freer elevators. Cement was then applied to the cut surface of the femur as well as along the posterior flanges of a size 3 posterior stabilized femoral component. Femoral component was positioned and impacted in place. Excess cement was removed using freer elevators. A 12.5 mm polyethylene trial was inserted and the knee was brought into full extension with steady axial compression applied. Finally, cement was applied to the backside of a 32 mm three peg oval dome patella and the patellar component was positioned and patellar clamp applied. Excess cement was removed using freer elevators. After adequate curing of cement, tourniquet was deflated after total tourniquet time of 75 minutes. Hemostasis was achieved using electrocautery. The knee was irrigated with copious amounts of normal saline with antibiotic solution using pulsatile lavage and then suctioned dry. Knee was inspected for any residual  cement debris. 30 mL of 0.25% Marcaine with epinephrine was injected along the posterior capsule. A 12.5 mm stabilized rotating platform polyethylene insert was inserted and the knee was placed through a range of motion. Excellent mediolateral soft tissue balancing was appreciated both in full extension and in 90 degrees of flexion. Excellent patellar tracking was appreciated.   Two medium drains were placed in the wound bed and brought out through a separate stab incision to be attached to reinfusion system. Medial parapatellar portion of the incision was reapproximated using interrupted sutures of #1 Vicryl. Subcutaneous tissue was approximated in layers using first #0 Vicryl followed by 2-0 Vicryl. Skin was closed with skin staples. Sterile dressing was applied.   Patient tolerated procedure well. She was transported to the recovery room in stable condition.   ____________________________ Laurice Record. Holley Bouche., MD jph:cms D: 11/12/2011 24:40:10 ET T: 11/12/2011 17:11:42 ET JOB#: [id number]  cc: Jeneen Rinks P. Holley Bouche., MD, <Dictator> JAMES P Holley Bouche MD ELECTRONICALLY SIGNED 11/18/2011 16:03

## 2015-02-06 NOTE — Op Note (Signed)
PATIENT NAME:  Kayla Gray, Kayla Gray MR#:  [id number] DATE OF BIRTH:  1936-10-15  DATE OF PROCEDURE:  11/12/2011  PROCEDURE: Left femoral nerve block.   SURGEON: Atha Mcbain P. Phineas Douglas, MD  INDICATION: To help this patient with postoperative pain who is about to have a left total knee arthroplasty by Dr. Skip Estimable.   DESCRIPTION OF PROCEDURE: The risks and benefits of the femoral nerve block and a spinal block were discussed with the patient in same-day surgery preoperatively and accepted. She was brought to the PAC-U preoperatively. Normal monitors were applied and she was placed on nasal cannula oxygen. She was lightly sedated with a total of 3 mg of Versed intravenously. She was still awake, talkative, and in good verbal communication with [place] but much more comfortable. The usual landmarks were observed. She had a Betadine prep of her left anterior thigh and groin area x3. A sterile technique was used. A Stimuplex monitor was used with a 22-gauge 2 inch Stimuplex needle and 0.7 mA of output. The needle was passed approximately 1.5 cm lateral to the left femoral pulsation. There was good left thigh movement on the first pass. There was no heme or paresthesias. She had a total of 30 mL of 0.25% bupivacaine with 1:400,000 of epinephrine injected incrementally. The needle was removed. She tolerated the block without problem or complication. Her leg was becoming numb even before she went back to the OR. I am hopeful that it will help give her significant postoperative pain relief for the duration of the block.   ____________________________ Werner Lean. Phineas Douglas, MD spp:cms D: 11/12/2011 10:09:17 ET T: 11/12/2011 10:32:41 ET JOB#: [id number]  cc: Nicki Reaper P. Phineas Douglas, MD, <Dictator> Lucilla Edin MD ELECTRONICALLY SIGNED 11/12/2011 12:14

## 2015-02-13 NOTE — Op Note (Signed)
PATIENT NAME:  Kayla Gray, Kayla Gray MR#:  [id number] DATE OF BIRTH:  08/14/1936  DATE OF PROCEDURE:  10/25/2014  PREOPERATIVE DIAGNOSIS: Degenerative arthrosis of the right knee (primary).   POSTOPERATIVE DIAGNOSIS: Degenerative arthrosis of the right knee (primary).    PROCEDURE PERFORMED: Right total knee arthroplasty using computer-assisted navigation.   SURGEON: Laurice Record. Holley Bouche., MD   ASSISTANT: Vance Peper, PA (required to maintain retraction throughout the procedure).   ANESTHESIA: Spinal.   ESTIMATED BLOOD LOSS: 25 mL   FLUIDS REPLACED: 1200 mL of crystalloid.   TOURNIQUET TIME: 72 minutes.   DRAINS: Two medium drains to a reinfusion system.   SOFT TISSUE RELEASES: Anterior cruciate ligament, posterior cruciate ligament, deep medial collateral ligament and patellofemoral ligament.   IMPLANTS UTILIZED: DePuy PFC Sigma size 3 posterior stabilized femoral component (cemented), size 3 MBT tibial component (cemented), 35 mm 3-peg oval dome patella (cemented), and a 10 mm stabilized rotating platform polyethylene insert.   INDICATIONS FOR SURGERY: The patient is a 79 year old female who has been seen for complaints of progressive right knee pain. X-rays demonstrated severe degenerative changes in tricompartmental fashion with a relative valgus deformity. After discussion of the risks and benefits of surgical intervention, the patient expressed understanding of the risks and benefits, and agreed with plans for surgical intervention.   PROCEDURE IN DETAIL: The patient was brought into the Operating Room and, after adequate spinal anesthesia was achieved, a tourniquet was placed on the patient's upper right thigh. The patient's right knee and leg were cleaned and prepped with alcohol and DuraPrep, draped in the usual sterile fashion. A "timeout" was performed as per usual protocol. The right lower extremity was exsanguinated using an Esmarch, and the tourniquet was inflated to 300 mmHg.    An anterior longitudinal incision was made followed by a standard mid vastus approach. A moderate effusion was evacuated. The deep fibers of the medial collateral ligament were elevated in a subperiosteal fashion off the medial flare of the tibia, so as to maintain a continuous soft tissue sleeve. The patella was subluxed laterally and the patellofemoral ligament was incised. Inspection of the knee demonstrated severe degenerative changes with bone-on-bone articulation appreciated laterally. Prominent osteophytes were debrided using a rongeur. Anterior and posterior cruciate ligaments were excised. Two 4.0 mm Schanz pins were inserted into the femur and into the tibia for attachment of the array of trackers used for computer-assisted navigation. The hip center was identified using a circumduction technique. Distal landmarks were mapped using the computer. The distal femur and proximal tibia were mapped using the computer. The distal femoral cutting guide was positioned using computer-assisted navigation so as to achieve a 5 degree distal valgus cut. Cut was performed and verified using the computer. Distal femur was sized and it was felt that a size 3 femoral component was appropriate. A size 3 cutting guide was positioned. The anterior cut was performed and verified using the computer. This was followed by completion of the posterior and chamfer cuts. Femoral cutting guide for the central box was then positioned and the central box cut was performed.   Attention was then directed to the proximal tibia. Medial and lateral menisci were excised. The extramedullary tibial cutting guide was positioned using computer-assisted navigation so as to achieve a 0 degree varus-valgus alignment and a 0 degree posterior slope. Cut was performed and verified using the computer. The proximal tibia was sized, and it was felt that a size 3 tibial tray was appropriate. Tibial and femoral trials were  inserted followed by  insertion of a 10 mm polyethylene trial. This allowed for excellent medial and lateral soft tissue balancing both in flexion and in extension. Finally, the patella was cut and prepared so as to accommodate a 35 mm, 3-peg oval dome patella. Patellar trial was placed and the knee was placed through a range of motion with excellent patellar tracking appreciated. The femoral trial was removed after debridement of the posterior osteophytes. Central post hole for the tibial component was reamed, followed by insertion of the keel punch. Tibial trials were then removed. Cut surfaces of bone were irrigated with copious amounts of normal saline with antibiotic solution using pulsatile lavage and then suctioned dry.   Polymethyl methacrylate cement was prepared in the usual fashion using a vacuum mixer. Cement was applied to the cut surface of the proximal tibia as well as along the undersurface of a size 3 MBT tibial component. Tibial component was positioned and impacted into place. Excess cement was removed using [employer] fast streamer. Cement was then applied to the cut surface of the femur, as well as along the posterior flanges of a size 3 posterior stabilized femoral component. Femoral component was positioned and impacted into place. Excess cement was removed using [employer] fast streamer. A 10 mm polyethylene trial was inserted and the knee was brought into full extension with steady axial compression applied. Finally, cement was applied to the backside of a 35 mm 3-peg oval dome patella, and the patellar component was positioned and patellar clamp applied. Excess cement was removed using [employer] fast streamer.   After adequate curing of the cement, tourniquet was deflated after a total tourniquet time of 72 minutes. Hemostasis was achieved using electrocautery. The knee was irrigated with copious amounts of normal saline with antibiotic solution using pulsatile lavage, then suctioned dry. The knee was inspected for any residual  cement debris. Next 20 mL of 1.3% Exparel and 40 mL of normal saline were injected along the posterior capsule, medial and lateral gutters, and along the arthrotomy site. A 10 mm stabilized rotating platform polyethylene insert was inserted and the knee was placed through a range of motion with excellent medial and lateral soft tissue balance appreciated and excellent patellar tracking appreciated.   Two medium drains were placed in the wound bed and brought out through a separate stab incision to be attached to a reinfusion system. The medial parapatellar portion of the incision was reapproximated using interrupted sutures of #1 Vicryl. The subcutaneous tissue was injected with 30 mL of 0.25% Marcaine with epinephrine. The subcutaneous tissue was then reapproximated in layers using first #0 Vicryl followed by 2-0 Vicryl. Skin was closed with skin staples. Sterile dressing was applied.   The patient tolerated the procedure well. She was transported to the recovery room in stable condition.    ____________________________ Laurice Record. Holley Bouche., MD jph:MT D: 10/25/2014 16:41:49 ET T: 10/25/2014 16:53:34 ET JOB#: [id number]  cc: Laurice Record. Holley Bouche., MD, <Dictator> JAMES P Holley Bouche MD ELECTRONICALLY SIGNED 11/01/2014 0:51

## 2015-02-13 NOTE — Consult Note (Signed)
PATIENT NAME:  Kayla Gray, Kayla Gray MR#:  [id number] DATE OF BIRTH:  17-Feb-1936  DATE OF CONSULTATION:  10/27/2014  REFERRING PHYSICIAN:   CONSULTING PHYSICIAN:  Kayla Lighter, MD  ADMITTING PHYSICIAN: Kayla Record. Holley Bouche., MD   PRIMARY CARE PHYSICIAN: Kayla Late, MD  REASON FOR CONSULTATION: Change in mental status.    BRIEF HISTORY: Kayla Gray is a 79 year old Caucasian female with a past medical history significant for obstructive sleep apnea on CPAP, severe aortic stenosis, history of stroke in February 2015, with minimal left-sided weakness, osteopenia, osteoarthritis, anemia chronically, presents to the hospital for elective right knee arthroplasty. The patient's surgery was done on 10/25/2014. Postoperatively she has been recovering fine, up until this morning when she was noted to be slower than baseline and some confusion. Due to her prior stroke a year ago, the orthopedic physician was concerned and requested medical consult.   During my exam, the patient is alert, a little sleepy. She states that this has been from no sleep last night as she was given medications almost every 2 hours. She has received oral oxycodone around 3:00 this morning and also received tramadol around 5:30 and also post midnight last night. She had not received any pain medications yesterday. She does not have any focal neuro deficits. Speech is very clear. She is oriented, a little slow to gather her thoughts and respond. The husband at bedside does not seem to be very concerned at this time.   PAST MEDICAL HISTORY:  1.  Obstructive sleep apnea on CPAP at home.  2.  History of breast cancer, status post right breast lumpectomy.  3.  Hyperlipidemia.  4.  Gastroesophageal reflex disease.  5.  Osteoarthritis.  6.  Diverticulosis.  7.  Severe aortic stenosis.  8.  Anemia of chronic disease.  9.  History of nonsustained SVT in the past.   PAST SURGICAL HISTORY:  1.  Right knee arthroscopic surgery and  arthroplasty now.  2.  Cholecystectomy.  3.  Tonsillectomy.  4.  Hysterectomy.  5.  Bilateral cataract surgery.  6.  Left knee surgery in the past.    ALLERGIES TO MEDICATIONS: CODEINE, EVISTA, STATINS AND SULFA DRUGS.   CURRENT MEDICATIONS:  1.  Tylenol [phone] mg q. 4 hours p.r.n. for fever.  2.  Maalox 30 mL q. 6 hours p.r.n. for indigestion and heartburn.  3.  Dulcolax 10 mg rectal suppository p.r.n. for constipation.  4.  Calcium carbonate with Vitamin D 1 tablet oral b.i.d. with meals.  5.  Cardizem 120 mg p.o. daily.  6.  Senokot 1 tablet oral b.i.d.  7.  Zetia 10 mg at bedtime.  8.  Flonase nasal spray 2 sprays each nostril p.r.n. daily.  9.  Milk of Magnesia 30 mL b.i.d. p.r.n. for constipation.  10.  Reglan 10 mg oral q.i.d.  11.  Morphine q. 4 hours p.r.n. for severe pain.  12.  Multivitamin 1 tablet p.o. daily.  13.  Oxycodone 5-10 mg q. 4 hours p.r.n. for moderate pain.  14.  Protonix 40 mg oral b.i.d. p.r.n.  15.  Tramadol 50-100 mg q. 4 hours p.r.n. for moderate pain.  16.  Paxil 20 mg p.o. q. a.m.  17.  Enoxaparin 30 mg subcutaneous every 12 hours.   SOCIAL HISTORY: Lives at home with her husband. No smoking, alcohol, or drug abuse.   FAMILY HISTORY: Mom with asthma. Dad with diabetes mellitus.   PHYSICAL EXAMINATION:  VITAL SIGNS: Temperature 99.2 degrees Fahrenheit, pulse 114, respirations 18, blood  pressure 104/70, pulse of 94% on room air.  GENERAL: Well-built, well-nourished female, sitting in a chair, not in any acute distress, sleepy but easily arousable.  HEENT: Normocephalic, atraumatic. Pupils are equal, round, reacting to light. Anicteric sclerae. Extraocular movements intact. Oropharynx is clear, without erythema, mass, or exudates.  NECK: Supple. No thyromegaly, JVD or carotid bruits. No lymphadenopathy.  LUNGS: Moving air bilaterally. No wheeze or crackles. Decreased bibasilar breath sounds.  CARDIOVASCULAR: S1, S2. Regular rate and rhythm. Loud  4/6 systolic murmur in that aortic area, radiating to the carotids noted. No rubs or gallops.  ABDOMEN: Soft, nontender, nondistended. No hepatosplenomegaly. Normal bowel sounds.  EXTREMITIES: No pedal edema. No clubbing or cyanosis. Feeble plus dorsalis pedis pulses palpable bilaterally.  SKIN: No acne, rash, or lesions.  LYMPHATICS: No cervical or inguinal lymphadenopathy.  NEUROLOGICAL: Cranial nerves seem to be intact, II through XII. Motor function is 5/5 in all 4 extremities. Sensation is intact. Complete neurologic exam in the lower extremities could not be done because of her recent surgery, and gait cannot be tested.  PSYCHOLOGICAL: The patient is alert and oriented x 3, just a little slow to respond and slow to gather thoughts, but oriented, and her speech is clear.   LABORATORY DATA: Sodium 141, potassium 3.6, chloride 107, bicarbonate 28, BUN 12, creatinine 0.87, glucose 109, and calcium of 7.7. Hemoglobin today is 8.5, whereas her hemoglobin baseline seems to be 12.1 before the surgery.   ASSESSMENT AND RECOMMENDATION: The patient is an 79 year old female with multiple medical problems, including severe aortic stenosis, obstructive sleep apnea, arthritis, anemia, history of syncope, gastroesophageal reflux disease, admitted to the hospital for right knee arthroplasty, is status post surgery done postoperative day 2. Medical consult is requested for confusion.  1.  Altered mental status. Does not appear to be stroke at this time. No focal neurologic deficits. A little slow to respond. No slurred speech. Could be from the pain medications, so will try to use only Tylenol for pain. She will need rest, because she has not slept all night. We will get a CT head to rule out any acute changes. Also, restart her CPAP at this time, with her history of sleep apnea. Check a urinalysis and chest x-ray, because she is a little bit tachycardic and low-grade temperature, to make sure underlying infection is  not causing changes in her mental status. Continue to monitor closely.  2.  Right knee arthroplasty, postoperative day 2. Management per orthopedic surgeon. Physical therapy. Postoperative deep vein thrombosis prophylaxis. Conservative use of pain medication to prevent changes in her mental status.  3.  Obstructive sleep apnea. Restart CPAP at this time.  4.  History of supraventricular tachycardia on Cardizem; monitor.  5.  Gastroesophageal reflux disease on proton pump inhibitor and also Maalox.  6.  Hyperlipidemia, on Zetia.  7.  Deep vein thrombosis prophylaxis.   CODE STATUS: Full Code.   TIME SPENT ON IN CONSULTATION: 50 minutes.     ____________________________ Kayla Lighter, MD rk:MT D: 10/27/2014 14:04:50 ET T: 10/27/2014 14:26:30 ET JOB#: [id number]  cc: Kayla Lighter, MD, <Dictator> Kayla Record. Holley Bouche., MD Kayla Late, MD  Kayla Lighter MD ELECTRONICALLY SIGNED 11/01/2014 16:16

## 2015-02-13 NOTE — Consult Note (Signed)
Brief Consult Note: Diagnosis: Osteoarthritis, Gastroesophageal Reflux Disease (Heartburn), Hyperlipididemia, OSA.   Patient was seen by consultant.   Consult note dictated.   Orders entered.   Comments: 78y/oF with PMH of Gastroesophageal Reflux Disease (Heartburn), OSA< severe aortic stenosis, Hyperlipidemia, anemia admitted for right knee arthorplasty, POD # 2- medical consult for confusion  * AMS- likely from pain meds early this am- received oxycodone and tramadol x 2- within 5 hour period. avoid narcotics for a day, also didnt sleep at all last night. No focal neuro deficits recent CVA in Feb 2015- CT head ordered also with low grade temp and tcahycardia- check UA and chest xray to r/o infec monitor clsoely restart her cpap  * Right TKR- management per ortho conservative pain meds use recommended, physical therapy, post op DVT prophylaxis  * OSA- restart CPAP  * H/o SVT- continue cardizem  * GERD- PPI, maalox  * Hyperlipidemia- statin  * DVT prophylaxis.  Electronic Signatures: Gladstone Lighter (MD)  (Signed 13-Jan-16 14:13)  Authored: Brief Consult Note   Last Updated: 13-Jan-16 14:13 by Gladstone Lighter (MD)

## 2015-02-13 NOTE — Discharge Summary (Signed)
PATIENT NAME:  Kayla Gray, Kayla Gray MR#:  [id number] DATE OF BIRTH:  Aug 27, 1936  DATE OF ADMISSION:  10/25/2014 DATE OF DISCHARGE:  10/29/2014  ADMITTING DIAGNOSIS: Degenerative arthrosis of right knee.   DISCHARGE DIAGNOSIS: Degenerative arthrosis of right knee.   CONSULTATIONS: Dr. Tressia Miners.   HISTORY: The patient is a 79 year old who has been followed at [facility] for progression of right knee pain. She had a long history of progressive right knee pain. She had localized most of the pain along the lateral aspect of the knee. The left knee pain was aggravated by weight-bearing activities. She reported swelling as well as near giving way of the knee. She had not seen any significant improvement in her condition despite the use of anti-inflammatory medication. The patient states the pain increases to the point that it is significantly interfering with her activities of daily living. X-rays taken in [place] showed narrowing of the lateral cartilage space with associated valgus alignment. Osteophyte as well as subchondral sclerosis was noted. After discussion of the risks and benefits of surgical intervention, the patient expressed her understanding of the risks and benefits and agreed for plans for surgical intervention.   PROCEDURE: Right total knee arthroplasty using computer-assisted navigation.   ANESTHESIA: Spinal.   SOFT TISSUE RELEASE: Anterior cruciate ligament, posterior cruciate ligament, deep medial collateral ligament, as well as patellofemoral ligament.   IMPLANTS UTILIZED: DePuy PFC Sigma size 3 posterior stabilized femoral component (cemented), size 3 MBT tibial component (cemented), 35 mm three-pegged oval dome patella (cemented), and a 10 mm stabilized rotating platform polyethylene insert.  HOSPITAL COURSE: The patient tolerated the procedure very well. She had no complications. She was then taken to PAC-U where she was stabilized and then transferred to  the orthopedic floor. The patient began receiving anticoagulation therapy of Lovenox 30 mg subcutaneous every 12 hours per anesthesia and pharmacy protocol. She was fitted with TED stockings bilaterally. These were allowed to be removed 1 hour per 8 hour shift. The right one was applied on day 2 following removal of the Hemovac and dressing change. She was also fitted with the AV-I compression foot pumps bilaterally set at 80 mmHg. Her calves have been nontender. There has been no evidence of any DVTs. Negative Homans sign. Heels were elevated off the bed using rolled towels.   The patient has denied any chest pain or shortness of breath. Vital signs have been stable. She has been afebrile. Hemodynamically she was stable and no transfusions were given other than the Autovac transfusion postoperatively. However, on day 2 postoperatively, she became slightly confused and disoriented. This was felt to be secondary to narcotics. However, due to her past history of CVA approximately a year ago, medical consult was obtained. CT of the head was performed which did not reveal any acute infarctions. She also had a chest x-ray which was unremarkable. A urinalysis also was negative. Overall her labs were negative for any evidence of a stroke. Subsequently narcotics were discontinued and she has cleared and done extremely well.   Physical therapy was initiated on day 1 for gait training and transfers. Upon leaving the hospital she has improved significantly after the narcotics were held. She had 86 degrees of flexion of the knee. Occupational therapy was also initiated on day 1 for ADL and assistive devices.   The patient's IV, Foley and Hemovac were DC'd on day 2 along with a dressing change. The wound was free of any drainage or signs of infection. The Polar Care  was reapplied to the surgical leg maintaining a temperature of 40 to 50 degrees Fahrenheit.   DISPOSITION: The patient is discharged to rehab facility in  improved stable condition.   DISCHARGE INSTRUCTIONS: She is to continue weight-bearing as tolerated. Continue using a rolling walker until cleared by physical therapy to go to a quad cane. She will receive PT for gait training and transfers and OT for ADL and assistive devices. Elevate the heels off the bed. Continue TED stockings bilaterally. These are allowed to be removed 1 hour per 8 hour shift. She is placed on a regular diet. Continue Polar Care maintaining a temperature of 40 to 50 degrees Fahrenheit. She was instructed on wound care. Change dressing as needed. She has a follow-up appointment in [facility] on January 26th at 9:15, sooner for any complications.   DISCHARGE MEDICATIONS: Calcium carbonate 500 mg 200 units daily, Cardizem ER 120 mg daily, Senokot-S 1 tablet b.i.d., Zetia 10 mg at bedtime, multivitamin capsule one per day, pantoprazole 40 mg b.i.d., Plavix 75 mg daily, Lovenox 30 mg every 12 hours for 14 days and then discontinue and begin taking one 81 mg enteric-coated aspirin, Tylenol ES 500 to 1000 mg every 4 hours for pain and fever, milk of magnesia 30 mL b.i.d., Mylanta DS 30 mL every 6 hours, Dulcolax suppository 10 mg rectally daily p.r.n. if no results with milk of magnesia, Flonase nasal spray 2 sprays to both nostrils p.r.n., tramadol 50 to 100 mg every 4 to 6 hours p.r.n. for pain.   PAST MEDICAL HISTORY: 1.  Hyperlipidemia.  2.  Generalized anxiety disorder. 3.  Cerebral vascular disease. 4.  Allergic rhinitis. 5.  Sleep apnea. 6.  Depression. 7.  Breast cancer.  8.  Esophageal reflux disease. 9.  Peptic ulcer disease. 10.  Iron deficiency anemia. 11.  Aortic valve stenosis.  ____________________________ Vance Peper, PA jrw:sb D: 10/29/2014 07:45:18 ET T: 10/29/2014 08:10:13 ET JOB#: [id number]  cc: Vance Peper, PA, <Dictator> Fishel Wamble PA ELECTRONICALLY SIGNED 10/30/2014 21:21

## 2015-03-02 ENCOUNTER — Other Ambulatory Visit: Payer: Self-pay | Admitting: Rheumatology

## 2015-03-02 DIAGNOSIS — M4316 Spondylolisthesis, lumbar region: Secondary | ICD-10-CM

## 2015-03-09 ENCOUNTER — Ambulatory Visit: Payer: Self-pay

## 2015-03-10 ENCOUNTER — Ambulatory Visit
Admission: RE | Admit: 2015-03-10 | Discharge: 2015-03-10 | Disposition: A | Discharge status: Home / Self Care | Payer: PPO | Source: Ambulatory Visit | Attending: Rheumatology | Admitting: Rheumatology

## 2015-03-10 DIAGNOSIS — M545 Low back pain: Secondary | ICD-10-CM | POA: Insufficient documentation

## 2015-03-10 DIAGNOSIS — M4316 Spondylolisthesis, lumbar region: Secondary | ICD-10-CM

## 2015-03-19 ENCOUNTER — Telehealth: Payer: Self-pay | Admitting: Cardiology

## 2015-03-19 NOTE — Telephone Encounter (Signed)
  Patient calls noting that she is feeling very poorly. She feels tired and fatigued. She has a h/o PSVT and takes PRN propanolol. She checked her HR earlier today and rate was 150 bpm. No palpitations, no chest pain, dyspnea, syncope/ near syncope. She admits to drinking a cup of regular coffee earlier today. She has not taken any of her BB today. She rechecked vitals while on the phone. HR had improved to 110 bmp. BP was stable at 113/84. I advised that she take 20 mg of propanolol as directed. Orders by Dr. [name] are for 20 mg TID PRN for tachycardia. She was also advised to refrain from caffeine and other stimulants including alcohol. Her husband is with her at home. She will call if any other issues.   Yousof Alderman 03/19/2015

## 2015-04-08 ENCOUNTER — Ambulatory Visit (INDEPENDENT_AMBULATORY_CARE_PROVIDER_SITE_OTHER): Payer: PPO | Admitting: Cardiovascular Disease

## 2015-04-08 ENCOUNTER — Encounter: Payer: Self-pay | Admitting: Cardiovascular Disease

## 2015-04-08 VITALS — BP 140/92 | HR 66 | Ht 62.0 in | Wt 165.5 lb

## 2015-04-08 DIAGNOSIS — I350 Nonrheumatic aortic (valve) stenosis: Secondary | ICD-10-CM

## 2015-04-08 DIAGNOSIS — I471 Supraventricular tachycardia, unspecified: Secondary | ICD-10-CM

## 2015-04-08 DIAGNOSIS — R000 Tachycardia, unspecified: Secondary | ICD-10-CM

## 2015-04-08 MED ORDER — PROPRANOLOL HCL 20 MG PO TABS: 20.0000 mg | ORAL_TABLET | Freq: Three times a day (TID) | ORAL | Status: DC | PRN

## 2015-04-08 NOTE — Assessment & Plan Note (Signed)
She reports having one episode of tachycardia , resolved with propranolol.  Recommend she call our office if she has recurrent episodes

## 2015-04-08 NOTE — Patient Instructions (Signed)
You are doing well. No medication changes were made.  Continue to take propranolol as needed for tachycardia  Echo in 10/16 for severe aortic valve stenosis  Please call us if you have new issues that need to be addressed before your next appt.  Your physician wants you to follow-up in: 6 months  You will receive a reminder letter in the mail two months in advance. If you don't receive a letter, please call our office to schedule the follow-up appointment.

## 2015-04-08 NOTE — Progress Notes (Signed)
Patient ID: Kayla Gray, female    DOB: [birth year], 79 y.o.   MRN: [id number]  HPI Comments: Kayla Gray is a very pleasant 79 year old woman who presents for routine followup  Of her aortic valve stenosis history of  severe aortic valve stenosis, SVT, syncope, hyperlipidemia, iron deficiency anemia, peptic ulcer disease Status post left total knee replacement 11/15/2011 Notes provided indicate a history of SVT in February 2013 while in rehabilitation requiring adenosine, diltiazem and beta blockers, admitted with SVT, elevated troponin felt secondary to demand ischemia from tachycardia. She was started on diltiazem 120 mg daily February 2015 with TIA-type symptoms, workup in the hospital, continued on aspirin and Plavix. 30 day monitor did not show significant arrhythmia   In follow-up today, she reports that she is doing well but has chronic fatigue.  Rarely has tachycardia.  One episode heart rate was 155 bpm peer she took propranolol with improvement of her symptoms.   Tachycardia symptoms are rare  Tolerating diltiazem 120 mg daily  she reports that she was told to stop meloxicam as she was on Plavix. She denies any GI bleeding. She does have significant back pain and would like to restart meloxicam  She had knee replacement surgery early 2016  last echocardiogram September 2015   EKG on today's visit shows normal sinus rhythm with rate 66 bpm, no significant ST or T-wave changes  Carotid ultrasound August 2012 showed no significant plaque  echocardiogram 01/ 8 2014 showed normal LV function, aortic valve velocity 315 cm/s, peak gradient 56 mm of mercury, mean gradient 19 mm of mercury, estimated aortic valve area 0.61 cm Echocardiogram in late 2014 showing velocity of 400   Stress test August 2012 showed normal EKG with exertion, normal stress echo  Lab work may 2015 showing total cholesterol 200, LDL 111   Allergies  Allergen Reactions  . Codeine   . Evista [Raloxifene]   .  Statins   . Sulfa Antibiotics     Current Outpatient Prescriptions on File Prior to Visit  Medication Sig Dispense Refill  . acetaminophen (TYLENOL) 325 MG tablet Take 650 mg by mouth every 6 (six) hours as needed.    Marland Kitchen aspirin 81 MG tablet Take 81 mg by mouth daily.    . Calcium Carbonate-Vitamin D (CALCIUM 600 + D PO) Take by mouth 2 (two) times daily.    . clopidogrel (PLAVIX) 75 MG tablet Take 75 mg by mouth daily with breakfast.    . diltiazem (CARDIZEM) 120 MG tablet Take 1 tablet (120 mg total) by mouth daily. 90 tablet 3  . fluticasone (FLONASE) 50 MCG/ACT nasal spray Place 2 sprays into the nose daily as needed.    . Multiple Vitamin (MULTIVITAMIN) tablet Take 1 tablet by mouth daily.    . NON FORMULARY at bedtime. CPAP    . pantoprazole (PROTONIX) 40 MG tablet     . PARoxetine (PAXIL) 20 MG tablet Take 20 mg by mouth daily.    . sennosides-docusate sodium (SENOKOT-S) 8.6-50 MG tablet Take 1 tablet by mouth 2 (two) times daily.     No current facility-administered medications on file prior to visit.    Past Medical History  Diagnosis Date  . Obstructive sleep apnea   . Hyperlipidemia   . Depression   . Osteopenia   . Iron deficiency anemia   . Syncope and collapse   . Aortic stenosis   . Bilateral cataracts   . Diverticulosis   . Osteoarthritis   . Gastroesophageal reflux  disease   . Peptic ulcer disease   . Urinary tract infection   . Heart murmur   . Breast cancer   . Supraventricular tachycardia   . Stroke     11/14/13    Past Surgical History  Procedure Laterality Date  . Knee arthroscopy      right  . Cholecystectomy    . Tonsillectomy    . Vaginal hysterectomy    . Cataract extraction, bilateral    . Knee arthroscopy      left    Social History  reports that she has never smoked. She does not have any smokeless tobacco history on file. She reports that she drinks alcohol. She reports that she does not use illicit drugs.  Family History family  history includes Heart failure in her mother.  Review of Systems  Constitutional: Negative.   Respiratory: Negative.   Cardiovascular: Negative.   Gastrointestinal: Negative.   Musculoskeletal: Positive for back pain and arthralgias.  Skin: Negative.   Neurological: Negative.   Hematological: Negative.   Psychiatric/Behavioral: Negative.   All other systems reviewed and are negative.   BP 140/92 mmHg  Pulse 66  Ht 5\' 2"  (1.575 m)  Wt 165 lb 8 oz (75.07 kg)  BMI 30.26 kg/m2  Physical Exam  Constitutional: She is oriented to person, place, and time. She appears well-developed and well-nourished.  HENT:  Head: Normocephalic.  Nose: Nose normal.  Mouth/Throat: Oropharynx is clear and moist.  Eyes: Conjunctivae are normal. Pupils are equal, round, and reactive to light.  Neck: Normal range of motion. Neck supple. No JVD present.  Cardiovascular: Normal rate, regular rhythm, S1 normal, S2 normal and intact distal pulses.  Exam reveals no gallop and no friction rub.   Murmur heard.  Crescendo systolic murmur is present with a grade of 3/6  Pulmonary/Chest: Effort normal and breath sounds normal. No respiratory distress. She has no wheezes. She has no rales. She exhibits no tenderness.  Abdominal: Soft. Bowel sounds are normal. She exhibits no distension. There is no tenderness.  Musculoskeletal: Normal range of motion. She exhibits no edema or tenderness.  Lymphadenopathy:    She has no cervical adenopathy.  Neurological: She is alert and oriented to person, place, and time. Coordination normal.  Skin: Skin is warm and dry. No rash noted. No erythema.  Psychiatric: She has a normal mood and affect. Her behavior is normal. Judgment and thought content normal.    Assessment and Plan  Nursing note and vitals reviewed.      since

## 2015-04-08 NOTE — Assessment & Plan Note (Signed)
Severe aortic valve stenosis. She reports being asymptomatic. We have recommended repeat echocardiogram September 2016

## 2015-04-20 ENCOUNTER — Other Ambulatory Visit: Payer: Self-pay | Admitting: Family Medicine

## 2015-04-20 DIAGNOSIS — Z1231 Encounter for screening mammogram for malignant neoplasm of breast: Secondary | ICD-10-CM

## 2015-04-22 ENCOUNTER — Ambulatory Visit
Admission: RE | Admit: 2015-04-22 | Discharge: 2015-04-22 | Disposition: A | Discharge status: Home / Self Care | Payer: PPO | Source: Ambulatory Visit | Attending: Family Medicine | Admitting: Family Medicine

## 2015-04-22 DIAGNOSIS — Z1231 Encounter for screening mammogram for malignant neoplasm of breast: Secondary | ICD-10-CM | POA: Insufficient documentation

## 2015-06-13 ENCOUNTER — Telehealth: Payer: Self-pay

## 2015-06-13 NOTE — Telephone Encounter (Signed)
Spoke w/ pt.  She reports that she is feeling much better, BP now is 97/65. Advised her that this is still low. She reports that it has not run this low before.  Asked her to continue to monitor and call me tomorrow.   She is agreeable and appreciative.

## 2015-06-13 NOTE — Telephone Encounter (Signed)
Spoke w/ pt's husband.   He reports that pt had a busy weekend, spending time w/ special needs daughter, cooking and walking around more than usual.  Reports that pt did not sleep well last night.   She woke this am not feeling well, she did not check her vitals, but took a propranolol 20 mg at 9:10. Husband check her BP at 9:30 and it was 89/65, HR 68. Advised him to have pt lie down w/ feet elevated, increase her fluids and sodium. He is agreeable to this and will call back after lunch if pt is not feeling better.

## 2015-06-13 NOTE — Telephone Encounter (Signed)
Pt's husband called back stating that pt is lying in bed on her side snoring.  He is asking if he should wake her up to push fluids. Advised him to let pt sleep and monitor her when she wakes up. He is agreeable and will call back later.

## 2015-06-13 NOTE — Telephone Encounter (Signed)
Pt husband called, states pt was very weak when she woke up this morning. States she may have over exerted yesterday. States she didn't rest well last night. States she could not hardly hold her head up this morning, BP this morning was 89/65, HR was 68. States he got her to drink some water, and Sprite, and Ginger Ale. States she has "perked up" some. He gave her some saltines also. States she has laid back down now. PLease call. Denies any chest pain, sob.

## 2015-06-14 NOTE — Telephone Encounter (Signed)
Spoke w/ pt.  She reports that she is feeling much better today and states "I think I'll be okay". Asked her to call back if her sx return.

## 2015-08-08 ENCOUNTER — Other Ambulatory Visit: Payer: PPO

## 2015-08-09 ENCOUNTER — Other Ambulatory Visit: Payer: Self-pay

## 2015-08-09 ENCOUNTER — Ambulatory Visit (INDEPENDENT_AMBULATORY_CARE_PROVIDER_SITE_OTHER): Payer: PPO

## 2015-08-09 DIAGNOSIS — I350 Nonrheumatic aortic (valve) stenosis: Secondary | ICD-10-CM

## 2015-08-12 ENCOUNTER — Encounter: Payer: Self-pay | Admitting: Cardiovascular Disease

## 2015-08-12 ENCOUNTER — Ambulatory Visit (INDEPENDENT_AMBULATORY_CARE_PROVIDER_SITE_OTHER): Payer: PPO | Admitting: Cardiovascular Disease

## 2015-08-12 ENCOUNTER — Ambulatory Visit
Admission: RE | Admit: 2015-08-12 | Discharge: 2015-08-12 | Disposition: A | Discharge status: Home / Self Care | Payer: PPO | Source: Ambulatory Visit | Attending: Cardiovascular Disease | Admitting: Cardiovascular Disease

## 2015-08-12 VITALS — BP 130/80 | HR 82 | Ht 62.0 in | Wt 168.2 lb

## 2015-08-12 DIAGNOSIS — R0602 Shortness of breath: Secondary | ICD-10-CM

## 2015-08-12 DIAGNOSIS — Z01812 Encounter for preprocedural laboratory examination: Secondary | ICD-10-CM | POA: Diagnosis not present

## 2015-08-12 DIAGNOSIS — I471 Supraventricular tachycardia: Secondary | ICD-10-CM

## 2015-08-12 DIAGNOSIS — I6310 Cerebral infarction due to embolism of unspecified precerebral artery: Secondary | ICD-10-CM

## 2015-08-12 DIAGNOSIS — Z01810 Encounter for preprocedural cardiovascular examination: Secondary | ICD-10-CM | POA: Diagnosis present

## 2015-08-12 DIAGNOSIS — M545 Low back pain, unspecified: Secondary | ICD-10-CM | POA: Insufficient documentation

## 2015-08-12 DIAGNOSIS — I350 Nonrheumatic aortic (valve) stenosis: Secondary | ICD-10-CM

## 2015-08-12 DIAGNOSIS — R000 Tachycardia, unspecified: Secondary | ICD-10-CM

## 2015-08-12 NOTE — Assessment & Plan Note (Addendum)
Previous TIA symptoms. No further episodes on aspirin and Plavix

## 2015-08-12 NOTE — Assessment & Plan Note (Signed)
She is very active, lifting, cleaning, repeat irritation of her back   recommended minimizing any heavy lifting,  Would ice, use of NSAIDs , rest

## 2015-08-12 NOTE — Progress Notes (Signed)
Patient ID: Kayla Gray, female    DOB: October 09, 1936, 79 y.o.   MRN: [id number]  HPI Comments: Kayla Gray is a very pleasant 79 year old woman who presents for routine followup  Of her aortic valve stenosis history of  severe aortic valve stenosis, SVT, syncope, hyperlipidemia, iron deficiency anemia, peptic ulcer disease Status post left total knee replacement 11/15/2011 Notes provided indicate a history of SVT in February 2013 while in rehabilitation requiring adenosine, diltiazem and beta blockers, admitted with SVT, elevated troponin felt secondary to demand ischemia from tachycardia. She was started on diltiazem 120 mg daily February 2015 with TIA-type symptoms, workup in the hospital, continued on aspirin and Plavix. 30 day monitor did not show significant arrhythmia  In follow-up today, she reports that she has had significant shortness of breath over the past 2 months typically with exertion Recent echocardiogram confirming worsening aortic valve stenosis, now critical. Peak velocity 510 cm/s, mean gradient 54 mmHg, peak gradient 104 mmHg.  She denies any significant chest pain with exertion Symptoms of shortness of breath have been worrisome to her, limiting her activities Denies any lightheadedness, dizziness, near syncope She does report having some worsening back discomfort. Husband reports she may have some spinal stenosis  EKG on today's visit shows normal sinus rhythm with rate 82 bpm, no significant ST or T-wave changes  Other past medical history  She had knee replacement surgery early 2016  Carotid ultrasound August 2012 showed no significant plaque  echocardiogram 01/ 8 2014 showed normal LV function, aortic valve velocity 315 cm/s, peak gradient 56 mm of mercury, mean gradient 19 mm of mercury, estimated aortic valve area 0.61 cm Echocardiogram in late 2014 showing velocity of 400   Stress test August 2012 showed normal EKG with exertion, normal stress echo  Lab  work may 2015 showing total cholesterol 200, LDL 111   Allergies  Allergen Reactions  . Codeine   . Evista [Raloxifene]   . Statins   . Sulfa Antibiotics     Current Outpatient Prescriptions on File Prior to Visit  Medication Sig Dispense Refill  . acetaminophen (TYLENOL) 325 MG tablet Take 650 mg by mouth every 6 (six) hours as needed.    Marland Kitchen aspirin 81 MG tablet Take 81 mg by mouth daily.    . Calcium Carbonate-Vitamin D (CALCIUM 600 + D PO) Take by mouth 2 (two) times daily.    . clopidogrel (PLAVIX) 75 MG tablet Take 75 mg by mouth daily with breakfast.    . diltiazem (CARDIZEM) 120 MG tablet Take 1 tablet (120 mg total) by mouth daily. 90 tablet 3  . ezetimibe (ZETIA) 10 MG tablet Take 10 mg by mouth daily.    . fluticasone (FLONASE) 50 MCG/ACT nasal spray Place 2 sprays into the nose daily as needed.    . Multiple Vitamin (MULTIVITAMIN) tablet Take 1 tablet by mouth daily.    . NON FORMULARY at bedtime. CPAP    . pantoprazole (PROTONIX) 40 MG tablet Take 40 mg by mouth daily.     Marland Kitchen PARoxetine (PAXIL) 20 MG tablet Take 20 mg by mouth daily.    . propranolol (INDERAL) 20 MG tablet Take 1 tablet (20 mg total) by mouth 3 (three) times daily as needed (tachycardia). 90 tablet 4   No current facility-administered medications on file prior to visit.    Past Medical History  Diagnosis Date  . Obstructive sleep apnea   . Hyperlipidemia   . Depression   . Osteopenia   .  Iron deficiency anemia   . Syncope and collapse   . Aortic stenosis   . Bilateral cataracts   . Diverticulosis   . Osteoarthritis   . Gastroesophageal reflux disease   . Peptic ulcer disease   . Urinary tract infection   . Heart murmur   . Breast cancer ([place])   . Supraventricular tachycardia ([employer])   . Stroke [facility])     11/14/13    Past Surgical History  Procedure Laterality Date  . Knee arthroscopy      right  . Cholecystectomy    . Tonsillectomy    . Vaginal hysterectomy    . Cataract extraction,  bilateral    . Knee arthroscopy      left  . Breast biopsy Right 1999    breast ca radation    Social History  reports that she has never smoked. She does not have any smokeless tobacco history on file. She reports that she drinks alcohol. She reports that she does not use illicit drugs.  Family History family history includes Heart failure in her mother.  Review of Systems  Constitutional: Negative.   Respiratory: Positive for shortness of breath.   Cardiovascular: Negative.   Gastrointestinal: Negative.   Musculoskeletal: Positive for back pain and arthralgias.  Skin: Negative.   Neurological: Negative.   Hematological: Negative.   Psychiatric/Behavioral: Negative.   All other systems reviewed and are negative.   BP 130/80 mmHg  Pulse 82  Ht 5\' 2"  (1.575 m)  Wt 168 lb 4 oz (76.318 kg)  BMI 30.77 kg/m2  Physical Exam  Constitutional: She is oriented to person, place, and time. She appears well-developed and well-nourished.  HENT:  Head: Normocephalic.  Nose: Nose normal.  Mouth/Throat: Oropharynx is clear and moist.  Eyes: Conjunctivae are normal. Pupils are equal, round, and reactive to light.  Neck: Normal range of motion. Neck supple. No JVD present.  Cardiovascular: Normal rate, regular rhythm, S1 normal, S2 normal and intact distal pulses.  Exam reveals no gallop and no friction rub.   Murmur heard.  Crescendo systolic murmur is present with a grade of 3/6  Pulmonary/Chest: Effort normal and breath sounds normal. No respiratory distress. She has no wheezes. She has no rales. She exhibits no tenderness.  Abdominal: Soft. Bowel sounds are normal. She exhibits no distension. There is no tenderness.  Musculoskeletal: Normal range of motion. She exhibits no edema or tenderness.  Lymphadenopathy:    She has no cervical adenopathy.  Neurological: She is alert and oriented to person, place, and time. Coordination normal.  Skin: Skin is warm and dry. No rash noted. No  erythema.  Psychiatric: She has a normal mood and affect. Her behavior is normal. Judgment and thought content normal.    Assessment and Plan  Nursing note and vitals reviewed.      since

## 2015-08-12 NOTE — Assessment & Plan Note (Signed)
We'll continue diltiazem. She denies any symptoms concerning for tachycardia  She will take propranolol as needed for breakthrough arrhythmia

## 2015-08-12 NOTE — Assessment & Plan Note (Signed)
No further episodes of SVT on her diltiazem.

## 2015-08-12 NOTE — Assessment & Plan Note (Signed)
Critical aortic valve stenosis on recent echocardiogram Peak velocity greater than 500 cm/s, peak gradient more than 100 mmHg She is symptomatic with increasing shortness of breath on exertion Various treatment options discussed with her. We will start with a cardiac catheterization. If this shows no significant CAD, potentially may be a candidate for minimally invasive repair. Catheterization discussed with her, risk and benefit. Orders placed

## 2015-08-12 NOTE — Patient Instructions (Addendum)
Aortic valve stenosis is getting worse, we need to fix the valve soon  No medication changes were made.  We will schedule a cardiac cath for aortic valve stenosis, shortness of breath Labs today Please proceed to the [facility] for a chest x-ray  Please call us if you have new issues that need to be addressed before your next appt.  Your physician wants you to follow-up in: 3 months.  You will receive a reminder letter in the mail two months in advance. If you don't receive a letter, please call our office to schedule the follow-up appointment.  [facility] Cardiac Cath Instructions   You are scheduled for a Cardiac Cath on:__Wednesday, Nov 2_____  Please arrive at 7:30 am on the day of your procedure  You will need to pre-register prior to the day of your procedure.  Enter through the [employer] at [facility].  Registration is the first desk on your right.  Please take the procedure order we have given you in order to be registered appropriately  Do not eat/drink anything after midnight  Someone will need to drive you home  It is recommended someone be with you for the first 24 hours after your procedure  Wear clothes that are easy to get on/off and wear slip on shoes if possible  Medications bring a current list of all medications with you  _X_ You may take all of your medications the morning of your procedure with enough water to swallow safely  Day of your procedure: Arrive at the [facility] entrance.  Free valet service is available.  After entering the [facility] please check-in at the registration desk (1st desk on your right) to receive your armband. After receiving your armband someone will escort you to the cardiac cath/special procedures waiting area.  The usual length of stay after your procedure is about 2 to 3 hours.  This can vary.   Angiogram An angiogram, also called angiography, is a procedure used to look at the blood vessels. In this procedure, dye is injected  through a long, thin tube (catheter) into an artery. X-rays are then taken. The X-rays will show if there is a blockage or problem in a blood vessel.  LET [facility] CARE PROVIDER KNOW ABOUT:  Any allergies you have, including allergies to shellfish or contrast dye.   All medicines you are taking, including vitamins, herbs, eye drops, creams, and over-the-counter medicines.   Previous problems you or members of your family have had with the use of anesthetics.   Any blood disorders you have.   Previous surgeries you have had.  Any previous kidney problems or failure you have had.  Medical conditions you have.   Possibility of pregnancy, if this applies. RISKS AND COMPLICATIONS Generally, an angiogram is a safe procedure. However, as with any procedure, problems can occur. Possible problems include:  Injury to the blood vessels, including rupture or bleeding.  Infection or bruising at the catheter site.  Allergic reaction to the dye or contrast used.  Kidney damage from the dye or contrast used.  Blood clots that can lead to a stroke or heart attack. BEFORE THE PROCEDURE  Do not eat or drink after midnight on the night before the procedure, or as directed by your health care provider.   Ask your health care provider if you may drink enough water to take any needed medicines the morning of the procedure.  PROCEDURE  You may be given a medicine to help you relax (  sedative) before and during the procedure. This medicine is given through an IV access tube that is inserted into one of your veins.   The area where the catheter will be inserted will be washed and shaved. This is usually done in the groin but may be done in the fold of your arm (near your elbow) or in the wrist.  A medicine will be given to numb the area where the catheter will be inserted (local anesthetic).  The catheter will be inserted with a guide wire into an artery. The catheter is guided by using a  type of X-ray (fluoroscopy) to the blood vessel being examined.   Dye is then injected into the catheter, and X-rays are taken. The dye helps to show where any narrowing or blockages are located.  AFTER THE PROCEDURE   If the procedure is done through the leg, you will be kept in bed lying flat for several hours. You will be instructed to not bend or cross your legs.  The insertion site will be checked frequently.  The pulse in your feet or wrist will be checked frequently.  Additional blood tests, X-rays, and electrocardiography may be done.   You may need to stay in the hospital overnight for observation.    This information is not intended to replace advice given to you by your health care provider. Make sure you discuss any questions you have with your health care provider.   Document Released: 07/11/2005 Document Revised: 10/22/2014 Document Reviewed: 03/04/2013 Elsevier Interactive Patient Education 2016 [place] After Refer to this sheet in the next few weeks. These instructions provide you with information about caring for yourself after your procedure. Your health care provider may also give you more specific instructions. Your treatment has been planned according to current medical practices, but problems sometimes occur. Call your health care provider if you have any problems or questions after your procedure. WHAT TO EXPECT AFTER THE PROCEDURE After your procedure, it is typical to have the following:  Bruising at the catheter insertion site that usually fades within 1-2 weeks.  Blood collecting in the tissue (hematoma) that may be painful to the touch. It should usually decrease in size and tenderness within 1-2 weeks. HOME CARE INSTRUCTIONS  Take medicines only as directed by your health care provider.  You may shower 24-48 hours after the procedure or as directed by your health care provider. Remove the bandage (dressing) and gently wash the  site with plain soap and water. Pat the area dry with a clean towel. Do not rub the site, because this may cause bleeding.  Do not take baths, swim, or use a hot tub until your health care provider approves.  Check your insertion site every day for redness, swelling, or drainage.  Do not apply powder or lotion to the site.  Do not lift over 10 lb (4.5 kg) for 5 days after your procedure or as directed by your health care provider.  Ask your health care provider when it is okay to:  Return to work or school.  Resume usual physical activities or sports.  Resume sexual activity.  Do not drive home if you are discharged the same day as the procedure. Have someone else drive you.  You may drive 24 hours after the procedure unless otherwise instructed by your health care provider.  Do not operate machinery or power tools for 24 hours after the procedure or as directed by your health care provider.  If your  procedure was done as an outpatient procedure, which means that you went home the same day as your procedure, a responsible adult should be with you for the first 24 hours after you arrive home.  Keep all follow-up visits as directed by your health care provider. This is important. SEEK MEDICAL CARE IF:  You have a fever.  You have chills.  You have increased bleeding from the catheter insertion site. Hold pressure on the site. SEEK IMMEDIATE MEDICAL CARE IF:  You have unusual pain at the catheter insertion site.  You have redness, warmth, or swelling at the catheter insertion site.  You have drainage (other than a small amount of blood on the dressing) from the catheter insertion site.  The catheter insertion site is bleeding, and the bleeding does not stop after 30 minutes of holding steady pressure on the site.  The area near or just beyond the catheter insertion site becomes pale, cool, tingly, or numb.   This information is not intended to replace advice given to you by  your health care provider. Make sure you discuss any questions you have with your health care provider.   Document Released: 04/19/2005 Document Revised: 10/22/2014 Document Reviewed: 03/04/2013 Elsevier Interactive Patient Education 2016 Else[employer] Inc[employer] Aortic Stenosis Aortic stenosis is a narrowing of the aortic valve. The aortic valve is a gate-like structure that is located between the lower left chamber of the heart (left ventricle) and the blood vessel that leads away from the heart (aorta). When the aortic valve is narrowed, it does not open all the way. This makes it hard for the heart to pump blood into the aorta and causes the heart to work harder. The extra work can weaken the heart over time and lead to heart failure. CAUSES  Causes of aortic stenosis include:  Calcium deposits on the aortic valve that have made the valve stiff. This condition generally affects those over the age of 91. It is the most common cause of aortic stenosis.  A birth defect.  Rheumatic fever. This is a problem that may occur after a strep throat infection that was not treated adequately. Rheumatic fever can cause permanent damage to heart valves. SIGNS AND SYMPTOMS  People with aortic stenosis usually have no symptoms until the condition becomes severe. It may take 10-20 years for mild or moderate aortic stenosis to become severe. Symptoms may include:   Shortness of breath, especially with physical activity.   Feeling weak and tired (fatigued) or getting tired easily.  Chest discomfort (angina). This may occur with minimal activity if the aortic stenosis is severe.  An irregular or faster-than-normal heartbeat.  Dizziness or fainting that happens with exertion or after taking certain heart medicines (such as nitroglycerin). DIAGNOSIS  Aortic stenosis is usually diagnosed with a physical exam and with a type of imaging test called echocardiography. During echocardiography, sound waves are used to  evaluate how blood flows through the heart. If your health care provider suspects aortic stenosis but the test does not clearly show it, a procedure called cardiac catheterization may be done to diagnose the condition. Tests may also be done to evaluate heart function. They may include:  Electrocardiography. During this test, the electrical impulses of the heart are recorded while you are lying down and sticky patches are placed on your chest, arms, and legs.  Stress tests. There is more than one type of stress test. If a stress test is needed, ask your health care provider about which type is best  for you.  Blood tests. TREATMENT  Treatment depends on how severe the aortic stenosis is, your symptoms, and the problems it is causing.   Observation. If the aortic stenosis is mild, no treatment may be needed. However, you will need to have the condition checked regularly to make sure it is not getting worse or causing serious problems.  Surgery. Surgery to repair or replace the aortic valve is the most common treatment for aortic stenosis. Several types of surgeries are available. The most common are open-heart surgery and transcutaneous aortic valve replacement (TAVR). TAVR does not require that the chest be opened. It is usually performed on elderly patients and those who are not able to have open-heart surgery.  Medicines. Medicines may be given to keep symptoms from getting worse. Medicines cannot reverse aortic stenosis. HOME CARE INSTRUCTIONS   You may need to avoid certain types of physical activity. If your aortic stenosis is mild, you may need to avoid only strenuous activity. The more severe your aortic stenosis, the more activities you will need to avoid. Talk with your health care provider about the types of activity you should avoid.  Take medicines only as directed by your health care provider.  If you are a woman with aortic valve stenosis and want to get pregnant, talk to your  health care provider before you become pregnant.  If you are a woman with aortic valve stenosis and are pregnant, keep all follow-up visits with all recommended health care providers.  Keep all follow-up visits for tests, exams, and treatments as directed by your health care provider. SEEK IMMEDIATE MEDICAL CARE IF:  You develop chest pain or tightness.   You develop shortness of breath or difficulty breathing.   You develop light-headedness or faint.   It feels like your heartbeat is irregular or faster than normal.  You have a fever.   This information is not intended to replace advice given to you by your health care provider. Make sure you discuss any questions you have with your health care provider.   Document Released: 06/30/2003 Document Revised: 06/22/2015 Document Reviewed: 09/25/2012 Elsevier Interactive Patient Education [employer].

## 2015-08-13 LAB — BASIC METABOLIC PANEL
BUN/Creatinine Ratio: 18 (ref 11–26)
BUN: 18 mg/dL (ref 8–27)
CALCIUM: 9.2 mg/dL (ref 8.7–10.3)
CO2: 23 mmol/L (ref 18–29)
Chloride: 108 mmol/L — ABNORMAL HIGH (ref 97–106)
Creatinine, Ser: 0.99 mg/dL (ref 0.57–1.00)
GFR, EST AFRICAN AMERICAN: 64 mL/min/{1.73_m2} (ref >59)
GFR, EST NON AFRICAN AMERICAN: 55 mL/min/{1.73_m2} — AB (ref >59)
Glucose: 81 mg/dL (ref 65–99)
Potassium: 4.8 mmol/L (ref 3.5–5.2)
Sodium: 145 mmol/L — ABNORMAL HIGH (ref 136–144)

## 2015-08-13 LAB — CBC WITH DIFFERENTIAL/PLATELET
BASOS ABS: 0.1 10*3/uL (ref 0.0–0.2)
BASOS: 1 %
EOS (ABSOLUTE): 0.3 10*3/uL (ref 0.0–0.4)
Eos: 6 %
HEMOGLOBIN: 10.8 g/dL — AB (ref 11.1–15.9)
Hematocrit: 35.2 % (ref 34.0–46.6)
IMMATURE GRANS (ABS): 0 10*3/uL (ref 0.0–0.1)
Immature Granulocytes: 0 %
LYMPHS ABS: 1.7 10*3/uL (ref 0.7–3.1)
LYMPHS: 28 %
MCH: 26 pg — AB (ref 26.6–33.0)
MCHC: 30.7 g/dL — AB (ref 31.5–35.7)
MCV: 85 fL (ref 79–97)
Monocytes Absolute: 0.4 10*3/uL (ref 0.1–0.9)
Monocytes: 6 %
Neutrophils Absolute: 3.5 10*3/uL (ref 1.4–7.0)
Neutrophils: 59 %
PLATELETS: 197 10*3/uL (ref 150–379)
RBC: 4.16 x10E6/uL (ref 3.77–5.28)
RDW: 14.4 % (ref 12.3–15.4)
WBC: 6 10*3/uL (ref 3.4–10.8)

## 2015-08-13 LAB — PROTIME-INR
INR: 1 (ref 0.8–1.2)
Prothrombin Time: 10.4 s (ref 9.1–12.0)

## 2015-08-16 ENCOUNTER — Other Ambulatory Visit: Payer: Self-pay | Admitting: Cardiovascular Disease

## 2015-08-16 DIAGNOSIS — I209 Angina pectoris, unspecified: Secondary | ICD-10-CM

## 2015-08-17 ENCOUNTER — Encounter
Admission: RE | Disposition: A | Discharge status: Home / Self Care | Payer: Self-pay | Source: Ambulatory Visit | Attending: Cardiovascular Disease

## 2015-08-17 ENCOUNTER — Ambulatory Visit
Admission: RE | Admit: 2015-08-17 | Discharge: 2015-08-17 | Disposition: A | Discharge status: Home / Self Care | Payer: PPO | Source: Ambulatory Visit | Attending: Cardiovascular Disease | Admitting: Cardiovascular Disease

## 2015-08-17 ENCOUNTER — Telehealth: Payer: Self-pay | Admitting: Cardiovascular Disease

## 2015-08-17 ENCOUNTER — Encounter: Payer: Self-pay | Admitting: Cardiovascular Disease

## 2015-08-17 DIAGNOSIS — K219 Gastro-esophageal reflux disease without esophagitis: Secondary | ICD-10-CM | POA: Diagnosis not present

## 2015-08-17 DIAGNOSIS — R000 Tachycardia, unspecified: Secondary | ICD-10-CM | POA: Insufficient documentation

## 2015-08-17 DIAGNOSIS — Z853 Personal history of malignant neoplasm of breast: Secondary | ICD-10-CM | POA: Diagnosis not present

## 2015-08-17 DIAGNOSIS — R0789 Other chest pain: Secondary | ICD-10-CM | POA: Insufficient documentation

## 2015-08-17 DIAGNOSIS — Z7982 Long term (current) use of aspirin: Secondary | ICD-10-CM | POA: Insufficient documentation

## 2015-08-17 DIAGNOSIS — Z87440 Personal history of urinary (tract) infections: Secondary | ICD-10-CM | POA: Insufficient documentation

## 2015-08-17 DIAGNOSIS — Z888 Allergy status to other drugs, medicaments and biological substances status: Secondary | ICD-10-CM | POA: Insufficient documentation

## 2015-08-17 DIAGNOSIS — Z9889 Other specified postprocedural states: Secondary | ICD-10-CM | POA: Diagnosis not present

## 2015-08-17 DIAGNOSIS — Z9049 Acquired absence of other specified parts of digestive tract: Secondary | ICD-10-CM | POA: Diagnosis not present

## 2015-08-17 DIAGNOSIS — Z882 Allergy status to sulfonamides status: Secondary | ICD-10-CM | POA: Diagnosis not present

## 2015-08-17 DIAGNOSIS — I350 Nonrheumatic aortic (valve) stenosis: Secondary | ICD-10-CM | POA: Insufficient documentation

## 2015-08-17 DIAGNOSIS — R0602 Shortness of breath: Secondary | ICD-10-CM | POA: Diagnosis not present

## 2015-08-17 DIAGNOSIS — E785 Hyperlipidemia, unspecified: Secondary | ICD-10-CM | POA: Diagnosis not present

## 2015-08-17 DIAGNOSIS — Z7951 Long term (current) use of inhaled steroids: Secondary | ICD-10-CM | POA: Diagnosis not present

## 2015-08-17 DIAGNOSIS — Z8249 Family history of ischemic heart disease and other diseases of the circulatory system: Secondary | ICD-10-CM | POA: Insufficient documentation

## 2015-08-17 DIAGNOSIS — Z8711 Personal history of peptic ulcer disease: Secondary | ICD-10-CM | POA: Diagnosis not present

## 2015-08-17 DIAGNOSIS — Z79899 Other long term (current) drug therapy: Secondary | ICD-10-CM | POA: Diagnosis not present

## 2015-08-17 DIAGNOSIS — I209 Angina pectoris, unspecified: Secondary | ICD-10-CM

## 2015-08-17 DIAGNOSIS — Z885 Allergy status to narcotic agent status: Secondary | ICD-10-CM | POA: Diagnosis not present

## 2015-08-17 DIAGNOSIS — R55 Syncope and collapse: Secondary | ICD-10-CM | POA: Diagnosis present

## 2015-08-17 DIAGNOSIS — Z8673 Personal history of transient ischemic attack (TIA), and cerebral infarction without residual deficits: Secondary | ICD-10-CM | POA: Diagnosis not present

## 2015-08-17 DIAGNOSIS — M1990 Unspecified osteoarthritis, unspecified site: Secondary | ICD-10-CM | POA: Diagnosis not present

## 2015-08-17 DIAGNOSIS — M8580 Other specified disorders of bone density and structure, unspecified site: Secondary | ICD-10-CM | POA: Insufficient documentation

## 2015-08-17 DIAGNOSIS — Z7902 Long term (current) use of antithrombotics/antiplatelets: Secondary | ICD-10-CM | POA: Diagnosis not present

## 2015-08-17 DIAGNOSIS — G4733 Obstructive sleep apnea (adult) (pediatric): Secondary | ICD-10-CM | POA: Insufficient documentation

## 2015-08-17 DIAGNOSIS — Z9842 Cataract extraction status, left eye: Secondary | ICD-10-CM | POA: Diagnosis not present

## 2015-08-17 DIAGNOSIS — Z9841 Cataract extraction status, right eye: Secondary | ICD-10-CM | POA: Insufficient documentation

## 2015-08-17 HISTORY — PX: CARDIAC CATHETERIZATION: SHX172

## 2015-08-17 SURGERY — LEFT HEART CATH
Anesthesia: Moderate Sedation

## 2015-08-17 SURGERY — LEFT HEART CATH AND CORONARY ANGIOGRAPHY
Anesthesia: Moderate Sedation | Laterality: Bilateral

## 2015-08-17 MED ORDER — ACETAMINOPHEN 325 MG PO TABS: 650.0000 mg | ORAL_TABLET | ORAL | Status: DC | PRN

## 2015-08-17 MED ORDER — MIDAZOLAM HCL 2 MG/2ML IJ SOLN
INTRAMUSCULAR | Status: DC | PRN
  Administered 2015-08-17 (×2): 1 mg via INTRAVENOUS

## 2015-08-17 MED ORDER — ATROPINE SULFATE 0.1 MG/ML IJ SOLN
INTRAMUSCULAR | Status: AC
  Filled 2015-08-17: qty 10

## 2015-08-17 MED ORDER — SODIUM CHLORIDE 0.9 % WEIGHT BASED INFUSION: 1.0000 mL/kg/h | INTRAVENOUS | Status: DC

## 2015-08-17 MED ORDER — ONDANSETRON HCL 4 MG/2ML IJ SOLN
4.0000 mg | Freq: Four times a day (QID) | INTRAMUSCULAR | Status: DC | PRN
  Administered 2015-08-17: 4 mg via INTRAVENOUS

## 2015-08-17 MED ORDER — IOHEXOL 300 MG/ML  SOLN
INTRAMUSCULAR | Status: DC | PRN
  Administered 2015-08-17: 80 mL via INTRAVENOUS

## 2015-08-17 MED ORDER — ASPIRIN 81 MG PO CHEW: 81.0000 mg | CHEWABLE_TABLET | ORAL | Status: DC

## 2015-08-17 MED ORDER — HEPARIN (PORCINE) IN NACL 2-0.9 UNIT/ML-% IJ SOLN
INTRAMUSCULAR | Status: AC
  Filled 2015-08-17: qty 1000

## 2015-08-17 MED ORDER — SODIUM CHLORIDE 0.9 % WEIGHT BASED INFUSION
3.0000 mL/kg/h | INTRAVENOUS | Status: DC
  Administered 2015-08-17: 3 mL/kg/h via INTRAVENOUS

## 2015-08-17 MED ORDER — MIDAZOLAM HCL 2 MG/2ML IJ SOLN
INTRAMUSCULAR | Status: AC
  Filled 2015-08-17: qty 2

## 2015-08-17 MED ORDER — SODIUM CHLORIDE 0.9 % IV BOLUS (SEPSIS)
500.0000 mL | Freq: Once | INTRAVENOUS | Status: AC
  Administered 2015-08-17: 500 mL via INTRAVENOUS

## 2015-08-17 MED ORDER — ONDANSETRON HCL 4 MG/2ML IJ SOLN
INTRAMUSCULAR | Status: AC
  Administered 2015-08-17: 4 mg via INTRAVENOUS
  Filled 2015-08-17: qty 2

## 2015-08-17 SURGICAL SUPPLY — 14 items
CATH INFINITI 5FR ANG PIGTAIL (CATHETERS) ×3 IMPLANT
CATH INFINITI 5FR JL4 (CATHETERS) ×3 IMPLANT
CATH INFINITI 5FR JL5 (CATHETERS) ×3 IMPLANT
CATH INFINITI JR4 5F (CATHETERS) ×3 IMPLANT
DEVICE CLOSURE MYNXGRIP 5F (Vascular Products) ×3 IMPLANT
FEM STOP ARCH (HEMOSTASIS) ×2
KIT MANI 3VAL PERCEP (MISCELLANEOUS) ×3 IMPLANT
NEEDLE PERC 18GX7CM (NEEDLE) ×3 IMPLANT
NEEDLE SMART 18G ACCESS (NEEDLE) IMPLANT
PACK CARDIAC CATH (CUSTOM PROCEDURE TRAY) ×3 IMPLANT
SHEATH AVANTI 5FR X 11CM (SHEATH) ×3 IMPLANT
SYSTEM COMPRESSION FEMOSTOP (HEMOSTASIS) ×1 IMPLANT
WIRE EMERALD 3MM-J .035X150CM (WIRE) ×3 IMPLANT
WIRE EMERALD ST .035X150CM (WIRE) ×3 IMPLANT

## 2015-08-17 NOTE — Telephone Encounter (Signed)
Recent cardiac cath showing no significant CAD Patient has severe aortic valve stenosis  CAn we place referral to see Dr. Roxy Manns, CT surgery in [place] She might be a candidate for minimally invasive aortic valve replacement

## 2015-08-17 NOTE — Progress Notes (Signed)
Patient is alert and oriented. Reporting no pain. Right groin site stable and WNL. Patient ambulated, tolerated well.

## 2015-08-17 NOTE — Progress Notes (Signed)
Patient c/o nausea, upon recheck BP had dropped significantly, HR 40-50s.  IV bolus and zofran administered, patient placed in trendelenburg.  Dr [name] notified, no additional orders at this time, will continue to monitor, per MD patient plan to be discharged once recovery complete. BP and vital signs stabilized, patient states nausea has subsided. Will continue to monitor.

## 2015-08-17 NOTE — Discharge Instructions (Signed)

## 2015-08-18 NOTE — Telephone Encounter (Signed)
Referral placed to Dr. Guy Sandifer office.  His office will call pt for appt.

## 2015-08-18 NOTE — Addendum Note (Signed)
Addended by: Dede Query R on: 08/18/2015 08:04 AM   Modules accepted: Orders

## 2015-08-22 ENCOUNTER — Telehealth: Payer: Self-pay

## 2015-08-22 NOTE — Telephone Encounter (Signed)
Pt called states last week she had a cath, and the site, there is a knot, and is painful to the touch.

## 2015-08-22 NOTE — Telephone Encounter (Signed)
Spoke w/ pt. Advised her that she has a hematoma and to rest, apply compress and continue to monitor. Asked her to call if it worsens or becomes more painful.  She verbalizes understanding and will call back w/ any questions or concerns.

## 2015-08-23 ENCOUNTER — Other Ambulatory Visit: Payer: Self-pay | Admitting: *Deleted

## 2015-08-23 ENCOUNTER — Institutional Professional Consult (permissible substitution) (INDEPENDENT_AMBULATORY_CARE_PROVIDER_SITE_OTHER): Payer: PPO | Admitting: Thoracic Surgery (Cardiothoracic Vascular Surgery)

## 2015-08-23 ENCOUNTER — Encounter: Payer: Self-pay | Admitting: Thoracic Surgery (Cardiothoracic Vascular Surgery)

## 2015-08-23 VITALS — BP 122/78 | HR 75 | Resp 20 | Ht 62.0 in | Wt 167.0 lb

## 2015-08-23 DIAGNOSIS — I350 Nonrheumatic aortic (valve) stenosis: Secondary | ICD-10-CM

## 2015-08-23 DIAGNOSIS — I5032 Chronic diastolic (congestive) heart failure: Secondary | ICD-10-CM | POA: Diagnosis not present

## 2015-08-23 NOTE — Patient Instructions (Signed)
Continue all previous medications without any changes at this time  Schedule CT scans as soon as practical

## 2015-08-23 NOTE — Progress Notes (Signed)
[address]       [place] [address]             [phone]     CARDIOTHORACIC SURGERY CONSULTATION REPORT  Referring Provider is [name], [name] November, MD PCP is [name], [name], MD  Chief Complaint  Patient presents with  . Aortic Stenosis    Surgical eval, Cardiac Cath 08/17/15, ECHO 08/09/15    HPI:  Patient is a 79 year old female with history of aortic stenosis, SVT, previous stroke, obstructive sleep apnea, Hyperlipidemia, iron deficiency anemia, peptic ulcer disease, and degenerative arthritis who has been referred for surgical consultation to discuss treatment options for management of severe symptomatic aortic stenosis.  Patient states that she has been told that she had a heart murmur for most of her adult life.  She was first diagnosed with aortic stenosis in February 2013 when she developed symptomatic SVT while she was rehabilitating following total knee replacement surgery. She was started on diltiazem at that time and an echocardiogram was performed demonstrating the presence of aortic stenosis. Possible referral for surgical consultation was discussed, but the patient remained otherwise asymptomatic and was followed medically. In February 2015 the patient suffered a brief syncopal episode with symptoms of left-sided weakness felt consistent with minor stroke or TIA. She was treated with aspirin and Plavix and 30 day event monitor did not reveal arrhythmia.  The patient recently developed symptoms of exertional shortness of breath. Follow-up transthoracic echocardiogram revealed significant progression in the severity of aortic stenosis with peak velocity across aortic valve measured 5.1 m/s corresponding to mean transvalvular gradient estimated 54 mmHg. Left ventricular systolic function remained preserved with ejection fraction estimated 60-65%. The patient underwent diagnostic cardiac catheterization by Dr. [name] on 08/17/2015 confirming the presence of  severe aortic stenosis. The patient did not have significant coronary artery disease. She was subsequently referred for surgical consultation.  The patient is married and lives in [place] with her husband. They have 3 grown children, one of whom is disabled. The patient has been functionally independent for all of her life. She has had problems with mobility over the last few years because of worsening degenerative arthritis afflicting both knees and her lower back. She underwent right total knee replacement in 2013 and left total knee replacement in January 2016. She states that she still remains somewhat unsteady on her feet although she is able to walk without use of a cane or walker. Over the last several months the patient has developed symptoms of exertional shortness of breath. She states that she only gets short of breath with or strenuous physical activity, and she tends to avoid this. Her exertional shortness of breath does not affect her day-to-day activities around the house to any significant degree. She denies any history of resting shortness of breath, PND, orthopnea, or lower extremity edema. She has occasional dizzy spells. She experienced one syncopal episode at the time of her stroke in 2015. She reports frequent palpitations. She denies exertional chest pain or chest tightness.  Past Medical History  Diagnosis Date  . Obstructive sleep apnea   . Hyperlipidemia   . Depression   . Osteopenia   . Iron deficiency anemia   . Syncope and collapse   . Aortic stenosis   . Bilateral cataracts   . Diverticulosis   . Osteoarthritis   . Gastroesophageal reflux disease   . Peptic ulcer disease   . Urinary tract infection   . Heart murmur   .  Breast cancer ([place])   . Supraventricular tachycardia ([place])   . Stroke [facility])     11/14/13    Past Surgical History  Procedure Laterality Date  . Knee arthroscopy      right  . Cholecystectomy    . Tonsillectomy    . Vaginal hysterectomy      . Cataract extraction, bilateral    . Knee arthroscopy      left  . Breast biopsy Right 1999    breast ca radation  . Cardiac catheterization N/A 08/17/2015    Procedure: Left Heart Cath and Coronary Angiography;  Surgeon: Minna Merritts, MD;  Location: [facility];  Service: Cardiovascular;  Laterality: N/A;    Family History  Problem Relation Age of Onset  . Heart failure Mother     Social History   Social History  . Marital Status: Married    Spouse Name: N/A  . Number of Children: N/A  . Years of Education: N/A   Occupational History  . Not on file.   Social History Main Topics  . Smoking status: Never Smoker   . Smokeless tobacco: Not on file  . Alcohol Use: Yes     Comment: wine  . Drug Use: No  . Sexual Activity: Not on file   Other Topics Concern  . Not on file   Social History Narrative    Current Outpatient Prescriptions  Medication Sig Dispense Refill  . acetaminophen (TYLENOL) 325 MG tablet Take 650 mg by mouth every 6 (six) hours as needed.    Marland Kitchen aspirin 81 MG tablet Take 81 mg by mouth daily.    . Calcium Carbonate-Vitamin D (CALCIUM 600 + D PO) Take by mouth 2 (two) times daily.    . clopidogrel (PLAVIX) 75 MG tablet Take 75 mg by mouth daily with breakfast.    . diltiazem (CARDIZEM) 120 MG tablet Take 1 tablet (120 mg total) by mouth daily. 90 tablet 3  . ezetimibe (ZETIA) 10 MG tablet Take 10 mg by mouth daily.    . fluticasone (FLONASE) 50 MCG/ACT nasal spray Place 2 sprays into the nose daily as needed.    . Multiple Vitamin (MULTIVITAMIN) tablet Take 1 tablet by mouth daily.    . NON FORMULARY at bedtime. CPAP    . pantoprazole (PROTONIX) 40 MG tablet Take 40 mg by mouth daily.     Marland Kitchen PARoxetine (PAXIL) 20 MG tablet Take 20 mg by mouth daily.    . propranolol (INDERAL) 20 MG tablet Take 1 tablet (20 mg total) by mouth 3 (three) times daily as needed (tachycardia). 90 tablet 4   No current facility-administered medications for this  visit.    Allergies  Allergen Reactions  . Codeine Hives  . Evista [Raloxifene]   . Statins Other (See Comments)    Leg muscle cramps  . Sulfa Antibiotics Hives      Review of Systems:   General:  normal appetite, decreased energy, no weight gain, no weight loss, no fever  Cardiac:  no chest pain with exertion, no chest pain at rest, + SOB with exertion, no resting SOB, no PND, no orthopnea, + palpitations, no arrhythmia, no atrial fibrillation, no LE edema, + dizzy spells, no syncope  Respiratory:  + exertional shortness of breath, no home oxygen, no productive cough, no dry cough, no bronchitis, no wheezing, no hemoptysis, no asthma, no pain with inspiration or cough, no sleep apnea, no CPAP at night  GI:   occasional difficulty swallowing, +  reflux, no frequent heartburn, no hiatal hernia, no abdominal pain, + constipation, no diarrhea, no hematochezia, no hematemesis, no melena  GU:   no dysuria,  + frequency, occasional urinary tract infection, no hematuria, no kidney stones, no kidney disease  Vascular:  no pain suggestive of claudication, + pain in feet, no leg cramps, no varicose veins, no DVT, no non-healing foot ulcer  Neuro:   + stroke, no TIA's, no seizures, no headaches, no temporary blindness one eye,  no slurred speech, no peripheral neuropathy, mild chronic pain, + mild instability of gait, no memory/cognitive dysfunction  Musculoskeletal: + arthritis, no joint swelling, no myalgias, mild difficulty walking, somewhat decreased mobility   Skin:   no rash, no itching, no skin infections, no pressure sores or ulcerations  Psych:   no anxiety, no depression, no nervousness, no unusual recent stress  Eyes:   no blurry vision, no floaters, no recent vision changes, + wears glasses for reading  ENT:   no hearing loss, no loose or painful teeth, no dentures, last saw dentist July 2016  Hematologic:  + easy bruising, no abnormal bleeding, no clotting disorder, no frequent  epistaxis  Endocrine:  no diabetes, does not check CBG's at home     Physical Exam:   BP 122/78 mmHg  Pulse 75  Resp 20  Ht 5\' 2"  (1.575 m)  Wt 167 lb (75.751 kg)  BMI 30.54 kg/m2  SpO2 96%  General:    well-appearing  HEENT:  Unremarkable   Neck:   no JVD, no bruits, no adenopathy   Chest:   clear to auscultation, symmetrical breath sounds, no wheezes, no rhonchi   CV:   RRR, grade III/VI crescendo/decrescendo systolic murmur   Abdomen:  soft, non-tender, no masses   Extremities:  warm, well-perfused, pulses diminished but palpable, no LE edema  Rectal/GU  Deferred  Neuro:   Grossly non-focal and symmetrical throughout  Skin:   Clean and dry, no rashes, no breakdown   Diagnostic Tests:  Transthoracic Echocardiography  Patient:  Abigal, Choung MR #:    [id number] Study Date: 08/09/2015 Gender:   F Age:    9 Height:   157.5 cm Weight:   75.3 kg BSA:    1.84 m^2 Pt. Status: Room:  ATTENDING  Esmond Plants, MD, [place] SONOGRAPHER Pilar Jarvis, RVT, RDCS, RDMS  cc:  ------------------------------------------------------------------- LV EF: 60% -  65%  ------------------------------------------------------------------- History:  PMH: History of syncope and TIA. Patient reports a noticeable increase in her dyspnea over the past 2 months. Aortic valve disease.  ------------------------------------------------------------------- Study Conclusions  - Left ventricle: The cavity size was normal. There was mild concentric hypertrophy. Systolic function was normal. The estimated ejection fraction was in the range of 60% to 65%. Wall motion was normal; there were no regional wall motion abnormalities. Doppler parameters are consistent with abnormal left ventricular relaxation (grade 1 diastolic dysfunction). - Aortic valve: There was severe stenosis.  Mean gradient (S): 54 mm Hg. Peak gradient (S): 104 mm Hg. Valve area (VTI): 0.59 cm^2. - Mitral valve: There was mild regurgitation. - Left atrium: The atrium was mildly dilated.  Impressions:  - The study is suboptimal and aortiv valve is poorly visulized.  Transthoracic echocardiography. M-mode, complete 2D, spectral Doppler, and color Doppler. Birthdate: Patient birthdate: [birth year]. Age: Patient is 79 yr old. Sex: Gender: female. BMI: 30.4 kg/m^2. Blood pressure:   132/82 Patient status: Outpatient. Study date: Study  date: 08/09/2015. Study time: 04:10 PM.  -------------------------------------------------------------------  ------------------------------------------------------------------- Left ventricle: The cavity size was normal. There was mild concentric hypertrophy. Systolic function was normal. The estimated ejection fraction was in the range of 60% to 65%. Wall motion was normal; there were no regional wall motion abnormalities. Doppler parameters are consistent with abnormal left ventricular relaxation (grade 1 diastolic dysfunction).  ------------------------------------------------------------------- Aortic valve: Poorly visualized. Trileaflet; moderately thickened, moderately calcified leaflets. Mobility was not restricted. Doppler:  There was severe stenosis.  There was no regurgitation.  VTI ratio of LVOT to aortic valve: 0.23. Valve area (VTI): 0.59 cm^2. Indexed valve area (VTI): 0.32 cm^2/m^2. Peak velocity ratio of LVOT to aortic valve: 0.18. Valve area (Vmax): 0.5 cm^2. Indexed valve area (Vmax): 0.27 cm^2/m^2. Mean velocity ratio of LVOT to aortic valve: 0.22. Valve area (Vmean): 0.62 cm^2. Indexed valve area (Vmean): 0.34 cm^2/m^2.  Mean gradient (S): 54 mm Hg. Peak gradient (S): 104 mm Hg.  ------------------------------------------------------------------- Aorta: Aortic root: The aortic root was normal in  size.  ------------------------------------------------------------------- Mitral valve:  Structurally normal valve.  Mobility was not restricted. Doppler: Transvalvular velocity was within the normal range. There was no evidence for stenosis. There was mild regurgitation.  Peak gradient (D): 2 mm Hg.  ------------------------------------------------------------------- Left atrium: The atrium was mildly dilated.  ------------------------------------------------------------------- Right ventricle: The cavity size was normal. Wall thickness was normal. Systolic function was normal.  ------------------------------------------------------------------- Pulmonic valve:  Doppler: Transvalvular velocity was within the normal range. There was no evidence for stenosis.  ------------------------------------------------------------------- Tricuspid valve:  Structurally normal valve.  Doppler: Transvalvular velocity was within the normal range. There was trivial regurgitation.  ------------------------------------------------------------------- Pulmonary artery:  The main pulmonary artery was normal-sized. Systolic pressure was within the normal range.  ------------------------------------------------------------------- Right atrium: The atrium was normal in size.  ------------------------------------------------------------------- Pericardium: There was no pericardial effusion.  ------------------------------------------------------------------- Systemic veins: Inferior vena cava: The vessel was normal in size.  ------------------------------------------------------------------- Measurements  Left ventricle              Value     Reference LV ID, ED, PLAX chordal          43.9 mm    43 - 52 LV PW thickness, ED            11.2 mm    --------- IVS/LV PW ratio, ED            1.06      <=1.3 Stroke  volume, 2D             64  ml    --------- Stroke volume/bsa, 2D           35  ml/m^2  --------- LV e&', lateral              5.95 cm/s   --------- LV E/e&', lateral             12.29     --------- LV e&', medial               5.95 cm/s   --------- LV E/e&', medial              12.29     --------- LV e&', average              5.95 cm/s   --------- LV E/e&', average             12.29     ---------  Ventricular septum  Value     Reference IVS thickness, ED             11.9 mm    ---------  LVOT                   Value     Reference LVOT ID, S                19  mm    --------- LVOT area                 2.84 cm^2   --------- LVOT ID                  19  mm    --------- LVOT peak velocity, S           89.3 cm/s   --------- LVOT mean velocity, S           57.5 cm/s   --------- LVOT VTI, S                22.7 cm    --------- LVOT peak gradient, S           3   mm Hg  --------- Stroke volume (SV), LVOT DP        64.4 ml    --------- Stroke index (SV/bsa), LVOT DP      34.9 ml/m^2  ---------  Aortic valve               Value     Reference Aortic valve peak velocity, S       510  cm/s   --------- Aortic valve mean velocity, S       262  cm/s   --------- Aortic valve VTI, S            98.7 cm    --------- Aortic mean gradient, S          54  mm Hg  --------- Aortic peak gradient, S          104  mm Hg  --------- VTI ratio, LVOT/AV            0.23      --------- Aortic valve area, VTI          0.59 cm^2   --------- Aortic  valve area/bsa, VTI        0.32 cm^2/m^2 --------- Velocity ratio, peak, LVOT/AV       0.18      --------- Aortic valve area, peak velocity     0.5  cm^2   --------- Aortic valve area/bsa, peak        0.27 cm^2/m^2 --------- velocity Velocity ratio, mean, LVOT/AV       0.22      --------- Aortic valve area, mean velocity     0.62 cm^2   --------- Aortic valve area/bsa, mean        0.34 cm^2/m^2 --------- velocity  Aorta                   Value     Reference Aortic root ID, ED            29  mm    ---------  Left atrium                Value     Reference LA ID, A-P, ES              39  mm    ---------  LA ID/bsa, A-P              2.12 cm/m^2  <=2.2 LA volume, S               68  ml    --------- LA volume/bsa, S             36.9 ml/m^2  --------- LA volume, ES, 1-p A4C          88  ml    --------- LA volume/bsa, ES, 1-p A4C        47.8 ml/m^2  --------- LA volume, ES, 1-p A2C          51  ml    --------- LA volume/bsa, ES, 1-p A2C        27.7 ml/m^2  ---------  Mitral valve               Value     Reference Mitral E-wave peak velocity        73.1 cm/s   --------- Mitral A-wave peak velocity        95.8 cm/s   --------- Mitral deceleration time         180  ms    150 - 230 Mitral peak gradient, D          2   mm Hg  --------- Mitral E/A ratio, peak          0.8      ---------  Legend: (L) and (H) mark values outside specified reference range.  ------------------------------------------------------------------- Prepared and Electronically Authenticated by  Kathlyn Sacramento, MD 2016-10-26T15:31:30    CARDIAC CATHETERIZATION     Name: ED RAYSON MRN: [id number] DOB: [birth year]  Procedure: Left Heart Cath, Selective Coronary Angiography, LV angiography  Indication: Severe aortic valve stenosis, SOB, chest discomfort/angina pectoris, tachycardia. No CHF admissions, normal EF on echo. Critical AS on echo with peak gradient of 100 mm Hg, peak velocity 500 cm/sec  Procedural details: The right groin was prepped, draped, and anesthetized with 1% lidocaine. Using modified Seldinger technique, a 5 French sheath was introduced into the right femoral artery. Standard Judkins catheters were used for coronary angiography and left ventriculography. Catheter exchanges were performed over a guidewire. There were no immediate procedural complications. The patient was transferred to the post catheterization recovery area for further monitoring.  Procedural Findings:  Coronary angiography:  Coronary dominance: Left   Left mainstem: Large vessel that bifurcates into the LAD and LCX. No significant disease noted.   Left anterior descending (LAD): Large vessel extending to the apical region, one large diagonal vessel. No significant disease noted.   Left circumflex (LCx): Large vessel, No significant disease noted.   Right coronary artery (RCA): Moderate sized. Nondominant vessel, No significant disease noted.   Left ventriculography: Left ventricular systolic function is normal, LVEF is estimated at 55-65%, there is no significant mitral regurgitation . Heavily calcified aortic valve.  Severe aortic valve stenosis, Peak to peak gradient of 70 mm Hg (716 systolic in aorta, 967 mm Hg peak LV gradient) Significant ectopy noted (computer average underestimated the gradient)  Final Conclusions:  Severe aortic valve stenosis No significant CAD, Normal EF estimated at >55%  Recommendations:  Will refer for further evaluation for her aortic valve stenosis given worsening sx of SOB, chest tightness. May be a candidate for  minimally invasive procedure.  [place] 08/17/2015, 9:20 AM  Estimated blood loss <50 mL. There were no immediate complications during the procedure.  Coronary Findings    Dominance: Left    Wall Motion                 Left Heart    Left Ventricle The left ventricle is enlarged. The left ventricular systolic function is normal. The left ventricular ejection fraction is greater tha 65% by visual estimate. There are no wall motion abnormalities in the left ventricle.   Mitral Valve There is no mitral valve stenosis and no mitral valve regurgitation.   Aortic Valve There is severe aortic valve stenosis, and no aortic valve regurgitation. The aortic valve is calcified. There is restricted aortic valve motion.   Aorta The size of the ascending aorta is in the upper limits of normal.    Coronary Diagrams    Diagnostic Diagram            Implants    Name ID Temporary Type Supply   DEVICE CLOSURE MYNXGRIP 64F - [id number] No Vascular Products DEVICE CLOSURE MYNXGRIP 64F    PACS Images    Show images for Cardiac catheterization     Link to Procedure Log    Procedure Log      Hemo Data    AO Systolic Cath Pressure AO Diastolic Cath Pressure AO Mean Cath Pressure LV Systolic Cath Pressure LV End Diastolic   924 76 mmHg 268 mmHg -- --   -- -- -- 203 mmHg 30 mmHg   -- -- -- 202 mmHg 17 mmHg   -- -- -- 214 mmHg 22 mmHg   -- -- -- 223 mmHg 28 mmHg   -- -- -- 217 mmHg 26 mmHg   154 82 mmHg 105 mmHg -- --     STS Risk Calculator  Procedure    AVR  Risk of Mortality   2.1% Morbidity or Mortality  15.9% Prolonged LOS   8.0% Short LOS    32.0% Permanent Stroke   2.5% Prolonged Vent Support  11.1% DSW Infection    0.2% Renal Failure    2.9% Reoperation    7.3%   Impression:  Patient has stage D severe symptomatic aortic stenosis. She describes a several month history of progressive symptoms of exertional  shortness of breath and fatigue consistent with chronic diastolic congestive heart failure, New [place] Heart Association functional class II. I have partially reviewed the patient's recent transthoracic echocardiogram and diagnostic cardiac catheterization. Echocardiogram confirms the presence of severe thickening, calcification, fibrosis, and restricted leaflet mobility involving all 3 leaflets of the patient's aortic valve. Peak velocity across the aortic valve measured 5.1 m/s corresponding to a mean transvalvular gradient estimated 54 mmHg. Left ventricular systolic function remains normal with ejection fraction estimated 60-65%. Diagnostic cardiac catheterization is notable for the absence of significant coronary artery disease. I agree the patient would best be treated with aortic valve replacement. Risks associated with conventional surgery would likely be only mildly elevated because of the patient's age and somewhat limited physical mobility.  She might be a good candidate for minimally invasive approach for surgery, although transthoracic echocardiogram suggests possibility that the size of her aortic root and aortic annulus may be somewhat small.   Plan:  The patient and her family were counseled at length regarding treatment alternatives for management of severe aortic stenosis including continued medical therapy versus proceeding with aortic valve replacement in the near future.  The natural history of aortic stenosis was reviewed, as was long term prognosis with medical therapy alone.  Surgical options were discussed at length including  conventional surgical aortic valve replacement using either a mechanical prosthesis or a bioprosthetic tissue valve through either a conventional median sternotomy or using minimally invasive techniques.  Other alternatives including transcatheter aortic valve replacement were discussed.  Prior to making a definitive decision regarding optimal treatment option, we  plan to proceed with cardiac gated CT angiography of the heart to accurately assess the size and anatomical characteristics of the aortic valve and aortic root, and CT angiography of the aorta and iliac vessels to evaluate options for vascular access.  The patient and her family will return in 2 weeks to review the results of these tests and make definitive plans for surgical intervention. All of their questions have been addressed.   I spent in excess of 90 minutes during the conduct of this office consultation and >50% of this time involved direct face-to-face encounter with the patient for counseling and/or coordination of their care.   Valentina Gu. Roxy Manns, MD 08/23/2015 12:04 PM

## 2015-09-05 ENCOUNTER — Ambulatory Visit (HOSPITAL_COMMUNITY)
Admission: RE | Admit: 2015-09-05 | Discharge: 2015-09-05 | Disposition: A | Discharge status: Home / Self Care | Payer: PPO | Source: Ambulatory Visit | Attending: Thoracic Surgery (Cardiothoracic Vascular Surgery) | Admitting: Thoracic Surgery (Cardiothoracic Vascular Surgery)

## 2015-09-05 ENCOUNTER — Ambulatory Visit (HOSPITAL_COMMUNITY): Payer: PPO

## 2015-09-05 DIAGNOSIS — I358 Other nonrheumatic aortic valve disorders: Secondary | ICD-10-CM | POA: Insufficient documentation

## 2015-09-05 DIAGNOSIS — I350 Nonrheumatic aortic (valve) stenosis: Secondary | ICD-10-CM | POA: Diagnosis not present

## 2015-09-05 DIAGNOSIS — K449 Diaphragmatic hernia without obstruction or gangrene: Secondary | ICD-10-CM | POA: Diagnosis not present

## 2015-09-05 DIAGNOSIS — Q231 Congenital insufficiency of aortic valve: Secondary | ICD-10-CM | POA: Diagnosis not present

## 2015-09-05 DIAGNOSIS — I723 Aneurysm of iliac artery: Secondary | ICD-10-CM | POA: Insufficient documentation

## 2015-09-05 MED ORDER — METOPROLOL TARTRATE 1 MG/ML IV SOLN
INTRAVENOUS | Status: AC
  Filled 2015-09-05: qty 5

## 2015-09-05 MED ORDER — IOHEXOL 350 MG/ML SOLN
70.0000 mL | Freq: Once | INTRAVENOUS | Status: AC | PRN
  Administered 2015-09-05: 70 mL via INTRAVENOUS

## 2015-09-05 MED ORDER — METOPROLOL TARTRATE 1 MG/ML IV SOLN
5.0000 mg | INTRAVENOUS | Status: DC | PRN
Start: 2015-09-05 — End: 2015-09-06
  Administered 2015-09-05: 5 mg via INTRAVENOUS

## 2015-09-05 MED ORDER — IOHEXOL 350 MG/ML SOLN
80.0000 mL | Freq: Once | INTRAVENOUS | Status: AC | PRN
  Administered 2015-09-05: 80 mL via INTRAVENOUS

## 2015-09-06 ENCOUNTER — Other Ambulatory Visit: Payer: Self-pay | Admitting: *Deleted

## 2015-09-06 DIAGNOSIS — I350 Nonrheumatic aortic (valve) stenosis: Secondary | ICD-10-CM

## 2015-09-09 ENCOUNTER — Encounter (HOSPITAL_COMMUNITY): Payer: Self-pay

## 2015-09-09 ENCOUNTER — Ambulatory Visit (HOSPITAL_COMMUNITY)
Admission: RE | Admit: 2015-09-09 | Discharge: 2015-09-09 | Disposition: A | Discharge status: Home / Self Care | Payer: PPO | Source: Ambulatory Visit | Attending: Thoracic Surgery (Cardiothoracic Vascular Surgery) | Admitting: Thoracic Surgery (Cardiothoracic Vascular Surgery)

## 2015-09-09 ENCOUNTER — Encounter (HOSPITAL_COMMUNITY)
Admission: RE | Admit: 2015-09-09 | Discharge: 2015-09-09 | Disposition: A | Discharge status: Home / Self Care | Payer: PPO | Source: Ambulatory Visit | Attending: Thoracic Surgery (Cardiothoracic Vascular Surgery) | Admitting: Thoracic Surgery (Cardiothoracic Vascular Surgery)

## 2015-09-09 ENCOUNTER — Ambulatory Visit (HOSPITAL_BASED_OUTPATIENT_CLINIC_OR_DEPARTMENT_OTHER)
Admission: RE | Admit: 2015-09-09 | Discharge: 2015-09-09 | Disposition: A | Discharge status: Home / Self Care | Payer: PPO | Source: Ambulatory Visit | Attending: Thoracic Surgery (Cardiothoracic Vascular Surgery) | Admitting: Thoracic Surgery (Cardiothoracic Vascular Surgery)

## 2015-09-09 VITALS — BP 118/66 | HR 73 | Temp 98.6°F | Resp 20 | Ht 62.0 in | Wt 168.2 lb

## 2015-09-09 DIAGNOSIS — I6523 Occlusion and stenosis of bilateral carotid arteries: Secondary | ICD-10-CM | POA: Diagnosis not present

## 2015-09-09 DIAGNOSIS — Z01810 Encounter for preprocedural cardiovascular examination: Secondary | ICD-10-CM

## 2015-09-09 DIAGNOSIS — Z01818 Encounter for other preprocedural examination: Secondary | ICD-10-CM | POA: Diagnosis not present

## 2015-09-09 DIAGNOSIS — Z0183 Encounter for blood typing: Secondary | ICD-10-CM | POA: Diagnosis not present

## 2015-09-09 DIAGNOSIS — I517 Cardiomegaly: Secondary | ICD-10-CM | POA: Insufficient documentation

## 2015-09-09 DIAGNOSIS — Z01812 Encounter for preprocedural laboratory examination: Secondary | ICD-10-CM | POA: Insufficient documentation

## 2015-09-09 DIAGNOSIS — I350 Nonrheumatic aortic (valve) stenosis: Secondary | ICD-10-CM | POA: Insufficient documentation

## 2015-09-09 HISTORY — DX: Epistaxis: R04.0

## 2015-09-09 LAB — COMPREHENSIVE METABOLIC PANEL
ALT: 15 U/L (ref 14–54)
ANION GAP: 10 (ref 5–15)
AST: 28 U/L (ref 15–41)
Albumin: 3.9 g/dL (ref 3.5–5.0)
Alkaline Phosphatase: 63 U/L (ref 38–126)
BILIRUBIN TOTAL: 0.3 mg/dL (ref 0.3–1.2)
BUN: 16 mg/dL (ref 6–20)
CHLORIDE: 107 mmol/L (ref 101–111)
CO2: 23 mmol/L (ref 22–32)
Calcium: 9.5 mg/dL (ref 8.9–10.3)
Creatinine, Ser: 0.83 mg/dL (ref 0.44–1.00)
GFR calc Af Amer: >60 mL/min (ref >60)
Glucose, Bld: 93 mg/dL (ref 65–99)
POTASSIUM: 4.3 mmol/L (ref 3.5–5.1)
Sodium: 140 mmol/L (ref 135–145)
TOTAL PROTEIN: 6.8 g/dL (ref 6.5–8.1)

## 2015-09-09 LAB — BLOOD GAS, ARTERIAL
Acid-Base Excess: 0.8 mmol/L (ref 0.0–2.0)
Bicarbonate: 24.4 mEq/L — ABNORMAL HIGH (ref 20.0–24.0)
Drawn by: 42180
FIO2: 0.21
O2 SAT: 96.9 %
PO2 ART: 87 mmHg (ref 80.0–100.0)
Patient temperature: 98.6
TCO2: 25.4 mmol/L (ref 0–100)
pCO2 arterial: 35.5 mmHg (ref 35.0–45.0)
pH, Arterial: 7.451 — ABNORMAL HIGH (ref 7.350–7.450)

## 2015-09-09 LAB — URINALYSIS, ROUTINE W REFLEX MICROSCOPIC
BILIRUBIN URINE: NEGATIVE
GLUCOSE, UA: NEGATIVE mg/dL
HGB URINE DIPSTICK: NEGATIVE
KETONES UR: NEGATIVE mg/dL
Leukocytes, UA: NEGATIVE
Nitrite: NEGATIVE
PH: 6 (ref 5.0–8.0)
Protein, ur: NEGATIVE mg/dL
SPECIFIC GRAVITY, URINE: 1.027 (ref 1.005–1.030)

## 2015-09-09 LAB — CBC
HEMATOCRIT: 32.9 % — AB (ref 36.0–46.0)
HEMOGLOBIN: 10.5 g/dL — AB (ref 12.0–15.0)
MCH: 27.4 pg (ref 26.0–34.0)
MCHC: 31.9 g/dL (ref 30.0–36.0)
MCV: 85.9 fL (ref 78.0–100.0)
PLATELETS: 158 10*3/uL (ref 150–400)
RBC: 3.83 MIL/uL — AB (ref 3.87–5.11)
RDW: 14.2 % (ref 11.5–15.5)
WBC: 5.2 10*3/uL (ref 4.0–10.5)

## 2015-09-09 LAB — PULMONARY FUNCTION TEST
DL/VA % PRED: 114 %
DL/VA: 5.21 ml/min/mmHg/L
DLCO COR % PRED: 79 %
DLCO cor: 17.19 ml/min/mmHg
DLCO unc % pred: 73 %
DLCO unc: 15.84 ml/min/mmHg
FEF 25-75 Post: 2.39 L/sec
FEF 25-75 Pre: 1.57 L/sec
FEF2575-%CHANGE-POST: 52 %
FEF2575-%PRED-POST: 166 %
FEF2575-%Pred-Pre: 108 %
FEV1-%CHANGE-POST: 9 %
FEV1-%Pred-Post: 104 %
FEV1-%Pred-Pre: 95 %
FEV1-Post: 1.93 L
FEV1-Pre: 1.77 L
FEV1FVC-%CHANGE-POST: 16 %
FEV1FVC-%Pred-Pre: 107 %
FEV6-%Change-Post: -6 %
FEV6-%PRED-POST: 88 %
FEV6-%PRED-PRE: 94 %
FEV6-PRE: 2.23 L
FEV6-Post: 2.09 L
FEV6FVC-%Change-Post: 0 %
FEV6FVC-%PRED-PRE: 104 %
FEV6FVC-%Pred-Post: 105 %
FVC-%CHANGE-POST: -6 %
FVC-%PRED-POST: 84 %
FVC-%Pred-Pre: 90 %
FVC-POST: 2.09 L
FVC-Pre: 2.24 L
POST FEV1/FVC RATIO: 93 %
POST FEV6/FVC RATIO: 100 %
PRE FEV6/FVC RATIO: 100 %
Pre FEV1/FVC ratio: 79 %
RV % pred: 93 %
RV: 2.09 L
TLC % PRED: 91 %
TLC: 4.34 L

## 2015-09-09 LAB — PROTIME-INR
INR: 1.08 (ref 0.00–1.49)
PROTHROMBIN TIME: 14.2 s (ref 11.6–15.2)

## 2015-09-09 LAB — APTT: aPTT: 26 seconds (ref 24–37)

## 2015-09-09 LAB — ABO/RH: ABO/RH(D): O POS

## 2015-09-09 LAB — SURGICAL PCR SCREEN
MRSA, PCR: NEGATIVE
STAPHYLOCOCCUS AUREUS: NEGATIVE

## 2015-09-09 MED ORDER — CHLORHEXIDINE GLUCONATE 4 % EX LIQD: 30.0000 mL | CUTANEOUS | Status: DC

## 2015-09-09 MED ORDER — ALBUTEROL SULFATE (2.5 MG/3ML) 0.083% IN NEBU
2.5000 mg | INHALATION_SOLUTION | Freq: Once | RESPIRATORY_TRACT | Status: AC
  Administered 2015-09-09: 2.5 mg via RESPIRATORY_TRACT

## 2015-09-09 NOTE — Progress Notes (Signed)
VASCULAR LAB PRELIMINARY  PRELIMINARY  PRELIMINARY  PRELIMINARY  Pre-op Cardiac Surgery  Carotid Findings:  Mild intimal thickening in bilateral carotid arteries with a 1-39% stenosis. Vertebral arteries demonstrates antegrade flow.  Upper Extremity Right Left  Brachial Pressures 154 162  Radial Waveforms Normal  Normal  Ulnar Waveforms Normal Normal  Palmar Arch (Allen's Test) WNL WNL   Micaela Stith, Bonnye Fava, RVT 09/09/2015, 3:03 PM

## 2015-09-09 NOTE — Pre-Procedure Instructions (Signed)
Kayla Gray  09/09/2015      [id number] - [place], [place] - [address] [place] [address] Phone: [phone] Fax: [phone]  [facility] [place], [place] [address] Phone: [phone] Fax: [phone]    Your procedure is scheduled on Fri, Dec 2 @ 7:30 AM  Report to [facility] Admitting at 5:30 AM  Call this number if you have problems the morning of surgery:  [phone]   Remember:  Do not eat food or drink liquids after midnight.  Take these medicines the morning of surgery with A SIP OF WATER Flonase(Fluticasone-if needed),Paxil(Paroxetine),Pantoprazole(Protonix),and Inderal(Propranolol)               No Goody's,BC's,Aleve,Aspirin,Ibuprofen,Motrin,Advil,Fish Oil,or any Herbal Medications.    Do not wear jewelry, make-up or nail polish.  Do not wear lotions, powders, or perfumes.    Do not shave 48 hours prior to surgery.    Do not bring valuables to the hospital.  [facility] is not responsible for any belongings or valuables.  Contacts, dentures or bridgework may not be worn into surgery.  Leave your suitcase in the car.  After surgery it may be brought to your room.  For patients admitted to the hospital, discharge time will be determined by your treatment team.  Patients discharged the day of surgery will not be allowed to drive home.    Special instructions:  [facility] - Preparing for Surgery  Before surgery, you can play an important role.  Because skin is not sterile, your skin needs to be as free of germs as possible.  You can reduce the number of germs on you skin by washing with CHG (chlorahexidine gluconate) soap before surgery.  CHG is an antiseptic cleaner which kills germs and bonds with the skin to continue killing germs even after washing.  Please DO NOT use if you have an allergy to CHG or antibacterial  soaps.  If your skin becomes reddened/irritated stop using the CHG and inform your nurse when you arrive at Short Stay.  Do not shave (including legs and underarms) for at least 48 hours prior to the first CHG shower.  You may shave your face.  Please follow these instructions carefully:   1.  Shower with CHG Soap the night before surgery and the                                morning of Surgery.  2.  If you choose to wash your hair, wash your hair first as usual with your       normal shampoo.  3.  After you shampoo, rinse your hair and body thoroughly to remove the                      Shampoo.  4.  Use CHG as you would any other liquid soap.  You can apply chg directly       to the skin and wash gently with scrungie or a clean washcloth.  5.  Apply the CHG Soap to your body ONLY FROM THE NECK DOWN.        Do not use on open wounds or open sores.  Avoid contact with your eyes,       ears, mouth and genitals (private parts).  Wash  genitals (private parts)       with your normal soap.  6.  Wash thoroughly, paying special attention to the area where your surgery        will be performed.  7.  Thoroughly rinse your body with warm water from the neck down.  8.  DO NOT shower/wash with your normal soap after using and rinsing off       the CHG Soap.  9.  Pat yourself dry with a clean towel.            10.  Wear clean pajamas.            11.  Place clean sheets on your bed the night of your first shower and do not        sleep with pets.  Day of Surgery  Do not apply any lotions/deoderants the morning of surgery.  Please wear clean clothes to the hospital/surgery center.    Please read over the following fact sheets that you were given. Pain Booklet, Coughing and Deep Breathing, Blood Transfusion Information, MRSA Information and Surgical Site Infection Prevention

## 2015-09-10 LAB — HEMOGLOBIN A1C
Hgb A1c MFr Bld: 5.8 % — ABNORMAL HIGH (ref 4.8–5.6)
Mean Plasma Glucose: 120 mg/dL

## 2015-09-12 ENCOUNTER — Ambulatory Visit (INDEPENDENT_AMBULATORY_CARE_PROVIDER_SITE_OTHER): Payer: PPO | Admitting: Thoracic Surgery (Cardiothoracic Vascular Surgery)

## 2015-09-12 ENCOUNTER — Encounter: Payer: Self-pay | Admitting: Thoracic Surgery (Cardiothoracic Vascular Surgery)

## 2015-09-12 VITALS — BP 119/74 | HR 74 | Resp 16 | Ht 62.0 in | Wt 167.0 lb

## 2015-09-12 DIAGNOSIS — I350 Nonrheumatic aortic (valve) stenosis: Secondary | ICD-10-CM | POA: Diagnosis not present

## 2015-09-12 DIAGNOSIS — I5032 Chronic diastolic (congestive) heart failure: Secondary | ICD-10-CM | POA: Diagnosis not present

## 2015-09-12 NOTE — Patient Instructions (Signed)
Patient has been instructed not to take Plavix  Patient should continue taking all other medications without change through the day before surgery.  Patient should have nothing to eat or drink after midnight the night before surgery.  On the morning of surgery patient should take only Protonix with a sip of water.

## 2015-09-12 NOTE — H&P (Signed)
[address]       [place] [address]             [phone]          CARDIOTHORACIC SURGERY HISTORY AND PHYSICAL EXAM  Referring Provider is Kayla Situ, [name] November, MD PCP is [name], [name], MD  Chief Complaint  Patient presents with  . Aortic Stenosis    Surgical eval, Cardiac Cath 08/17/15, ECHO 08/09/15    HPI:  Patient is a 79 year old female with history of aortic stenosis, SVT, previous stroke, obstructive sleep apnea, Hyperlipidemia, iron deficiency anemia, peptic ulcer disease, and degenerative arthritis who has been referred for surgical consultation to discuss treatment options for management of severe symptomatic aortic stenosis. Patient states that she has been told that she had a heart murmur for most of her adult life. She was first diagnosed with aortic stenosis in February 2013 when she developed symptomatic SVT while she was rehabilitating following total knee replacement surgery. She was started on diltiazem at that time and an echocardiogram was performed demonstrating the presence of aortic stenosis. Possible referral for surgical consultation was discussed, but the patient remained otherwise asymptomatic and was followed medically. In February 2015 the patient suffered a brief syncopal episode with symptoms of left-sided weakness felt consistent with minor stroke or TIA. She was treated with aspirin and Plavix and 30 day event monitor did not reveal arrhythmia. The patient recently developed symptoms of exertional shortness of breath. Follow-up transthoracic echocardiogram revealed significant progression in the severity of aortic stenosis with peak velocity across aortic valve measured 5.1 m/s corresponding to mean transvalvular gradient estimated 54 mmHg. Left ventricular systolic function remained preserved with ejection fraction estimated 60-65%. The patient underwent diagnostic cardiac catheterization by Dr. [name] on 08/17/2015  confirming the presence of severe aortic stenosis. The patient did not have significant coronary artery disease. She was subsequently referred for surgical consultation.  The patient is married and lives in [place] with her husband. They have 3 grown children, one of whom is disabled. The patient has been functionally independent for all of her life. She has had problems with mobility over the last few years because of worsening degenerative arthritis afflicting both knees and her lower back. She underwent right total knee replacement in 2013 and left total knee replacement in January 2016. She states that she still remains somewhat unsteady on her feet although she is able to walk without use of a cane or walker. Over the last several months the patient has developed symptoms of exertional shortness of breath. She states that she only gets short of breath with or strenuous physical activity, and she tends to avoid this. Her exertional shortness of breath does not affect her day-to-day activities around the house to any significant degree. She denies any history of resting shortness of breath, PND, orthopnea, or lower extremity edema. She has occasional dizzy spells. She experienced one syncopal episode at the time of her stroke in 2015. She reports frequent palpitations. She denies exertional chest pain or chest tightness.  Patient returns to the office today for follow-up of stage D severe symptomatically aortic stenosis. She was originally seen in consultation on 08/23/2015. Since then she underwent CT angiography and she returns to the office today for follow-up with hopes to proceed with elective aortic valve replacement in the near future. She reports no new problems or complaints over the last few weeks.          Past  Medical History  Diagnosis Date  . Obstructive sleep apnea   . Hyperlipidemia   . Depression   . Osteopenia   . Iron deficiency anemia   . Syncope and collapse   . Aortic  stenosis   . Bilateral cataracts   . Diverticulosis   . Osteoarthritis   . Gastroesophageal reflux disease   . Peptic ulcer disease   . Urinary tract infection   . Heart murmur   . Breast cancer (Kayla Gray)   . Supraventricular tachycardia (Kayla Gray)   . Stroke (Kayla Gray)     11/14/13  . Chronic diastolic (congestive) heart failure ([place])   . Bleeding nose     Thurs night (09/08/15) and Fri (09/09/15) left side.Bleeding didn't last long    Past Surgical History  Procedure Laterality Date  . Knee arthroscopy      right  . Cholecystectomy    . Tonsillectomy    . Vaginal hysterectomy    . Cataract extraction, bilateral    . Knee arthroscopy      left  . Breast biopsy Right 1999    breast ca radation  . Cardiac catheterization N/A 08/17/2015    Procedure: Left Heart Cath and Coronary Angiography;  Surgeon: Kayla Merritts, MD;  Location: [facility];  Service: Cardiovascular;  Laterality: N/A;    Family History  Problem Relation Age of Onset  . Heart failure Mother     Social History Social History  Substance Use Topics  . Smoking status: Never Smoker   . Smokeless tobacco: Not on file  . Alcohol Use: Yes     Comment: wine    Prior to Admission medications   Medication Sig Start Date End Date Taking? Authorizing Provider  acetaminophen (TYLENOL) 325 MG tablet Take 650 mg by mouth every 6 (six) hours as needed.   Yes Historical Provider, MD  aspirin 81 MG tablet Take 81 mg by mouth daily.   Yes Historical Provider, MD  Calcium Carbonate-Vitamin D (CALCIUM 600 + D PO) Take by mouth 2 (two) times daily.   Yes Historical Provider, MD  clopidogrel (PLAVIX) 75 MG tablet Take 75 mg by mouth daily with breakfast.   Yes Historical Provider, MD  diltiazem (CARDIZEM) 120 MG tablet Take 1 tablet (120 mg total) by mouth daily. 01/08/14  Yes Kayla Merritts, MD  ezetimibe (ZETIA) 10 MG tablet Take 10 mg by mouth daily.   Yes Historical Provider, MD  fluticasone (FLONASE) 50 MCG/ACT nasal  spray Place 2 sprays into the nose daily as needed.   Yes Historical Provider, MD  Multiple Vitamin (MULTIVITAMIN) tablet Take 1 tablet by mouth daily.   Yes Historical Provider, MD  NON FORMULARY at bedtime. CPAP   Yes Historical Provider, MD  pantoprazole (PROTONIX) 40 MG tablet Take 40 mg by mouth daily.  07/04/14  Yes Historical Provider, MD  PARoxetine (PAXIL) 20 MG tablet Take 20 mg by mouth daily.   Yes Historical Provider, MD  propranolol (INDERAL) 20 MG tablet Take 1 tablet (20 mg total) by mouth 3 (three) times daily as needed (tachycardia). 04/08/15  Yes Kayla Merritts, MD    Allergies  Allergen Reactions  . Codeine Hives  . Evista [Raloxifene]   . Statins Other (See Comments)    Leg muscle cramps  . Sulfa Antibiotics Hives      Review of Systems:  General:normal appetite, decreased energy, no weight gain, no weight loss, no fever Cardiac:no chest pain with exertion, no chest pain at rest, +  SOB with exertion, no resting SOB, no PND, no orthopnea, + palpitations, no arrhythmia, no atrial fibrillation, no LE edema, + dizzy spells, no syncope Respiratory:+ exertional shortness of breath, no home oxygen, no productive cough, no dry cough, no bronchitis, no wheezing, no hemoptysis, no asthma, no pain with inspiration or cough, no sleep apnea, no CPAP at night [id number] difficulty swallowing, + reflux, no frequent heartburn, no hiatal hernia, no abdominal pain, + constipation, no diarrhea, no hematochezia, no hematemesis, no melena GU:no dysuria, + frequency, occasional urinary tract infection, no hematuria, no kidney stones, no kidney disease Vascular:no pain suggestive of claudication, + pain in feet, no leg cramps, no varicose veins, no DVT, no non-healing foot  ulcer Neuro:+ stroke, no TIA's, no seizures, no headaches, no temporary blindness one eye, no slurred speech, no peripheral neuropathy, mild chronic pain, + mild instability of gait, no memory/cognitive dysfunction Musculoskeletal:+ arthritis, no joint swelling, no myalgias, mild difficulty walking, somewhat decreased mobility  Skin:no rash, no itching, no skin infections, no pressure sores or ulcerations Psych:no anxiety, no depression, no nervousness, no unusual recent stress Eyes:no blurry vision, no floaters, no recent vision changes, + wears glasses for reading ENT:no hearing loss, no loose or painful teeth, no dentures, last saw dentist July 2016 Hematologic:+ easy bruising, no abnormal bleeding, no clotting disorder, no frequent epistaxis Endocrine:no diabetes, does not check CBG's at home   Physical Exam:  BP 122/78 mmHg  Pulse 75  Resp 20  Ht 5\' 2"  (1.575 m)  Wt 167 lb (75.751 kg)  BMI 30.54 kg/m2  SpO2 96% General: well-appearing HEENT:Unremarkable  Neck:no JVD, no bruits, no adenopathy  Chest:clear to auscultation, symmetrical breath sounds, no wheezes, no rhonchi  CV:RRR, grade III/VI crescendo/decrescendo systolic murmur  Abdomen:soft, non-tender, no masses  Extremities:warm, well-perfused, pulses diminished but palpable, no LE  edema Rectal/GUDeferred Neuro:Grossly non-focal and symmetrical throughout Skin:Clean and dry, no rashes, no breakdown   Diagnostic Tests:  Transthoracic Echocardiography  Patient:  Kayla Gray, Kayla Gray MR #:    [id number] Study Date: 08/09/2015 Gender:   F Age:    47 Height:   157.5 cm Weight:   75.3 kg BSA:    1.84 m^2 Pt. Status: Room:  ATTENDING  Esmond Plants, MD, [place] SONOGRAPHER Pilar Jarvis, RVT, RDCS, RDMS  cc:  ------------------------------------------------------------------- LV EF: 60% -  65%  ------------------------------------------------------------------- History:  PMH: History of syncope and TIA. Patient reports a noticeable increase in her dyspnea over the past 2 months. Aortic valve disease.  ------------------------------------------------------------------- Study Conclusions  - Left ventricle: The cavity size was normal. There was mild concentric hypertrophy. Systolic function was normal. The estimated ejection fraction was in the range of 60% to 65%. Wall motion was normal; there were no regional wall motion abnormalities. Doppler parameters are consistent with abnormal left ventricular relaxation (grade 1 diastolic dysfunction). - Aortic valve: There was severe stenosis. Mean gradient (S): 54 mm Hg. Peak gradient (S): 104 mm Hg. Valve area (VTI): 0.59 cm^2. - Mitral valve: There was mild regurgitation. - Left atrium: The atrium was mildly dilated.  Impressions:  - The study is suboptimal and aortiv valve is poorly visulized.  Transthoracic echocardiography. M-mode, complete 2D, spectral Doppler, and color Doppler. Birthdate: Patient birthdate: 24-Oct-1935. Age: Patient is 79 yr old. Sex: Gender: female. BMI:  30.4 kg/m^2. Blood pressure:   132/82 Patient status: Outpatient. Study date: Study date: 08/09/2015. Study time: 04:10 PM.  -------------------------------------------------------------------  ------------------------------------------------------------------- Left  ventricle: The cavity size was normal. There was mild concentric hypertrophy. Systolic function was normal. The estimated ejection fraction was in the range of 60% to 65%. Wall motion was normal; there were no regional wall motion abnormalities. Doppler parameters are consistent with abnormal left ventricular relaxation (grade 1 diastolic dysfunction).  ------------------------------------------------------------------- Aortic valve: Poorly visualized. Trileaflet; moderately thickened, moderately calcified leaflets. Mobility was not restricted. Doppler:  There was severe stenosis.  There was no regurgitation.  VTI ratio of LVOT to aortic valve: 0.23. Valve area (VTI): 0.59 cm^2. Indexed valve area (VTI): 0.32 cm^2/m^2. Peak velocity ratio of LVOT to aortic valve: 0.18. Valve area (Vmax): 0.5 cm^2. Indexed valve area (Vmax): 0.27 cm^2/m^2. Mean velocity ratio of LVOT to aortic valve: 0.22. Valve area (Vmean): 0.62 cm^2. Indexed valve area (Vmean): 0.34 cm^2/m^2.  Mean gradient (S): 54 mm Hg. Peak gradient (S): 104 mm Hg.  ------------------------------------------------------------------- Aorta: Aortic root: The aortic root was normal in size.  ------------------------------------------------------------------- Mitral valve:  Structurally normal valve.  Mobility was not restricted. Doppler: Transvalvular velocity was within the normal range. There was no evidence for stenosis. There was mild regurgitation.  Peak gradient (D): 2 mm Hg.  ------------------------------------------------------------------- Left atrium: The atrium was mildly  dilated.  ------------------------------------------------------------------- Right ventricle: The cavity size was normal. Wall thickness was normal. Systolic function was normal.  ------------------------------------------------------------------- Pulmonic valve:  Doppler: Transvalvular velocity was within the normal range. There was no evidence for stenosis.  ------------------------------------------------------------------- Tricuspid valve:  Structurally normal valve.  Doppler: Transvalvular velocity was within the normal range. There was trivial regurgitation.  ------------------------------------------------------------------- Pulmonary artery:  The main pulmonary artery was normal-sized. Systolic pressure was within the normal range.  ------------------------------------------------------------------- Right atrium: The atrium was normal in size.  ------------------------------------------------------------------- Pericardium: There was no pericardial effusion.  ------------------------------------------------------------------- Systemic veins: Inferior vena cava: The vessel was normal in size.  ------------------------------------------------------------------- Measurements  Left ventricle              Value     Reference LV ID, ED, PLAX chordal          43.9 mm    43 - 52 LV PW thickness, ED            11.2 mm    --------- IVS/LV PW ratio, ED            1.06      <=1.3 Stroke volume, 2D             64  ml    --------- Stroke volume/bsa, 2D           35  ml/m^2  --------- LV e&', lateral              5.95 cm/s   --------- LV E/e&', lateral             12.29     --------- LV e&', medial               5.95 cm/s   --------- LV E/e&', medial              12.29     --------- LV e&', average               5.95 cm/s   --------- LV E/e&', average             12.29     ---------  Ventricular septum            Value     Reference IVS thickness, ED  11.9 mm    ---------  LVOT                   Value     Reference LVOT ID, S                19  mm    --------- LVOT area                 2.84 cm^2   --------- LVOT ID                  19  mm    --------- LVOT peak velocity, S           89.3 cm/s   --------- LVOT mean velocity, S           57.5 cm/s   --------- LVOT VTI, S                22.7 cm    --------- LVOT peak gradient, S           3   mm Hg  --------- Stroke volume (SV), LVOT DP        64.4 ml    --------- Stroke index (SV/bsa), LVOT DP      34.9 ml/m^2  ---------  Aortic valve               Value     Reference Aortic valve peak velocity, S       510  cm/s   --------- Aortic valve mean velocity, S       262  cm/s   --------- Aortic valve VTI, S            98.7 cm    --------- Aortic mean gradient, S          54  mm Hg  --------- Aortic peak gradient, S          104  mm Hg  --------- VTI ratio, LVOT/AV            0.23      --------- Aortic valve area, VTI          0.59 cm^2   --------- Aortic valve area/bsa, VTI        0.32 cm^2/m^2 --------- Velocity ratio, peak, LVOT/AV       0.18      --------- Aortic valve area, peak velocity     0.5  cm^2   --------- Aortic valve area/bsa, peak        0.27 cm^2/m^2 --------- velocity Velocity ratio, mean, LVOT/AV       0.22      --------- Aortic valve area, mean velocity     0.62 cm^2   --------- Aortic valve  area/bsa, mean        0.34 cm^2/m^2 --------- velocity  Aorta                   Value     Reference Aortic root ID, ED            29  mm    ---------  Left atrium                Value     Reference LA ID, A-P, ES              39  mm    --------- LA ID/bsa, A-P              2.12 cm/m^2  <=2.2  LA volume, S               68  ml    --------- LA volume/bsa, S             36.9 ml/m^2  --------- LA volume, ES, 1-p A4C          88  ml    --------- LA volume/bsa, ES, 1-p A4C        47.8 ml/m^2  --------- LA volume, ES, 1-p A2C          51  ml    --------- LA volume/bsa, ES, 1-p A2C        27.7 ml/m^2  ---------  Mitral valve               Value     Reference Mitral E-wave peak velocity        73.1 cm/s   --------- Mitral A-wave peak velocity        95.8 cm/s   --------- Mitral deceleration time         180  ms    150 - 230 Mitral peak gradient, D          2   mm Hg  --------- Mitral E/A ratio, peak          0.8      ---------  Legend: (L) and (H) mark values outside specified reference range.  ------------------------------------------------------------------- Prepared and Electronically Authenticated by  Kathlyn Sacramento, MD 2016-10-26T15:31:30    CARDIAC CATHETERIZATION    Name: LONIE LAMAGNA MRN: [id number] DOB: [birth year]  Procedure: Left Heart Cath, Selective Coronary Angiography, LV angiography  Indication: Severe aortic valve stenosis, SOB, chest discomfort/angina pectoris, tachycardia. No CHF admissions, normal EF on echo. Critical AS on echo with peak gradient of 100 mm Hg, peak velocity 500 cm/sec  Procedural details: The right groin was prepped, draped, and anesthetized with 1%  lidocaine. Using modified Seldinger technique, a 5 French sheath was introduced into the right femoral artery. Standard Judkins catheters were used for coronary angiography and left ventriculography. Catheter exchanges were performed over a guidewire. There were no immediate procedural complications. The patient was transferred to the post catheterization recovery area for further monitoring.  Procedural Findings:  Coronary angiography:  Coronary dominance: Left   Left mainstem: Large vessel that bifurcates into the LAD and LCX. No significant disease noted.   Left anterior descending (LAD): Large vessel extending to the apical region, one large diagonal vessel. No significant disease noted.   Left circumflex (LCx): Large vessel, No significant disease noted.   Right coronary artery (RCA): Moderate sized. Nondominant vessel, No significant disease noted.   Left ventriculography: Left ventricular systolic function is normal, LVEF is estimated at 55-65%, there is no significant mitral regurgitation . Heavily calcified aortic valve.  Severe aortic valve stenosis, Peak to peak gradient of 70 mm Hg ([id number] systolic in aorta, 123456 mm Hg peak LV gradient) Significant ectopy noted (computer average underestimated the gradient)  Final Conclusions:  Severe aortic valve stenosis No significant CAD, Normal EF estimated at >55%  Recommendations:  Will refer for further evaluation for her aortic valve stenosis given worsening sx of SOB, chest tightness. May be a candidate for minimally invasive procedure.  Badger Lee 08/17/2015, 9:20 AM  Estimated blood loss <50 mL. There were no immediate complications during the procedure.    Coronary Findings    Dominance: Left    Wall Motion  Left Heart    Left Ventricle The left ventricle is enlarged. The left ventricular systolic function is normal. The left ventricular ejection fraction is  greater tha 65% by visual estimate. There are no wall motion abnormalities in the left ventricle.   Mitral Valve There is no mitral valve stenosis and no mitral valve regurgitation.   Aortic Valve There is severe aortic valve stenosis, and no aortic valve regurgitation. The aortic valve is calcified. There is restricted aortic valve motion.   Aorta The size of the ascending aorta is in the upper limits of normal.    Coronary Diagrams    Diagnostic Diagram            Implants    Name ID Temporary Type Supply   DEVICE CLOSURE MYNXGRIP 34F - [id number] No Vascular Products DEVICE CLOSURE MYNXGRIP 34F    PACS Images    Show images for Cardiac catheterization     Link to Procedure Log    Procedure Log      Hemo Data    AO Systolic Cath Pressure AO Diastolic Cath Pressure AO Mean Cath Pressure LV Systolic Cath Pressure LV End Diastolic   [id number] 76 mmHg [id number] mmHg -- --   -- -- -- 203 mmHg 30 mmHg   -- -- -- 202 mmHg 17 mmHg   -- -- -- 214 mmHg 22 mmHg   -- -- -- 223 mmHg 28 mmHg   -- -- -- 217 mmHg 26 mmHg   154 82 mmHg 105 mmHg -- --     STS Risk Calculator  ProcedureAVR  Risk of Mortality2.1% Morbidity or Mortality15.9% Prolonged LOS8.0% Short LOS32.0% Permanent Stroke2.5% Prolonged Vent Support11.1% DSW Infection0.2% Renal Failure2.9% Reoperation7.3%   Cardiac TAVR CT TECHNIQUE: The patient was scanned on a Philips 256 scanner. A 120 kV retrospective scan was triggered in the descending thoracic aorta at 111 HU's. Gantry  rotation speed was 270 msecs and collimation was .9 mm. 5 mg of iv Metoprolol and no nitro were given. The 3D data set was reconstructed in 5% intervals of the R-R cycle. Systolic and diastolic phases were analyzed on a dedicated work station using MPR, MIP and VRT modes. The patient received 80 cc of contrast. FINDINGS: Aortic Valve: Severely thickened and calcified bicuspid aortic valve. Severely restricted leaflet opening. Aorta: Normal size with no calcifications and no dissection. Sinotubular Junction: 28 x 26 mm Ascending Thoracic Aorta: 35 x 34 mm Aortic Arch: 30 x 27 mm Descending Thoracic Aorta: 23 x 23 mm Sinus of Valsalva Measurements: 34 x 26 mm Coronary Arteries: Normal coronary origin. Left dominance. Left main is free of plaque. LAD is a large vessel that gives rise to one diagonal branch and has no plaque. LCX artery is a large caliber artery that gives rise to one OM branch, PDA and PLA. There is no plaque. RCA is a small to medium size non-dominant vessel with no significant plaque. Other findings: Normal pulmonary vein drainage into the left atrium (two on the right and two on the left). Fairly large left atrial appendage with no filling defect. No ASD or VSD seen. Normal pulmonary artery size. IMPRESSION: 1. Severely thickened and calcified bicuspid aortic valve with severely restricted leaflet opening. 2. Normal size of the thoracic aorta with no calcifications and no dissection. 3. Normal coronary origin with left dominance and no evidence of CAD. 4. No significant incidental findings. Ena Dawley Electronically Signed  By: Ena Dawley  On: 09/05/2015 17:30     Study  Result     EXAM: OVER-READ INTERPRETATION CT CHEST  The following report is an over-read performed by radiologist Dr. Fonnie Birkenhead [facility] on 09/05/2015. This over-read does not include interpretation of cardiac or coronary anatomy  or pathology. The coronary calcium score/coronary CTA interpretation by the cardiologist is attached.  COMPARISON: 11/17/2011.  FINDINGS: Tiny hiatal hernia. Extracardiac structures are otherwise unremarkable.  IMPRESSION: Tiny hiatal hernia. Extracardiac structures are otherwise unremarkable.  Electronically Signed: By: Lorin Picket M.D. On: 09/05/2015 09:39                  Impression:  Patient has stage D severe symptomatic aortic stenosis. She describes a several month history of progressive symptoms of exertional shortness of breath and fatigue consistent with chronic diastolic congestive heart failure, [place] Heart Association functional class II. I have partially reviewed the patient's recent transthoracic echocardiogram, diagnostic cardiac catheterization, and CT angiograms. Echocardiogram confirms the presence of severe thickening, calcification, fibrosis, and restricted leaflet mobility involving all 3 leaflets of the patient's aortic valve. Peak velocity across the aortic valve measured 5.1 m/s corresponding to a mean transvalvular gradient estimated 54 mmHg. Left ventricular systolic function remains normal with ejection fraction estimated 60-65%. Diagnostic cardiac catheterization is notable for the absence of significant coronary artery disease. I agree the patient would best be treated with aortic valve replacement. Cardiac gated CT angiogram of the heart demonstrates the presence of a true bicuspid aortic valve with severe aortic stenosis. Risks associated with conventional surgery would likely be only mildly elevated because of the patient's age and somewhat limited physical mobility. Under the circumstances I feel that she would best be treated with conventional surgical aortic valve replacement. She might be a good candidate for use of a sutureless valve in an attempt to minimize the duration of cardiopulmonary bypass and thereby minimize morbidity and the  risks of perioperative stroke.   Plan:  The patient was again counseled at length regarding treatment alternatives for management of severe symptomatic aortic stenosis. Surgical options were discussed at length including a comparison between conventional surgical aortic valve replacement using a typical sutured bioprosthetic tissue valve or a sutureless bioprosthetic tissue valve. The differences between these 2 types of valves have been discussed. Alternative surgical approaches have been discussed including conventional median sternotomy and minimally invasive techniques. Other alternatives including transcatheter aortic valve replacement were discussed. This discussion was placed in the context of the patient's particular circumstances, and as a result the patient specifically requests that their valve be replaced using a sutureless bioprosthetic tissue valve. The patient understands and accepts all potential associated risks of surgery including but not limited to risk of death, stroke, myocardial infarction, congestive heart failure, respiratory failure, renal failure, pneumonia, bleeding requiring blood transfusion and or reexploration, arrhythmia, heart block or bradycardia requiring permanent pacemaker, aortic dissection or other major vascular complication, pleural effusions or other delayed complications related to continued congestive heart failure, and other late complications related to valve replacement including structural valve deterioration and failure, thrombosis, endocarditis, or paravalvular leak. We plan to proceed with surgery on Friday, 09/16/2015. The patient has been instructed not to take Plavix in anticipation of her surgery. All of her questions have been addressed.   I spent in excess of 30 minutes during the conduct of this office consultation and >50% of this time involved direct face-to-face encounter with the patient for counseling and/or coordination of their  care.    Valentina Gu. Roxy Manns, MD 09/12/2015 5:52 PM

## 2015-09-12 NOTE — Progress Notes (Signed)
[address]       [place] [address]             [phone]     CARDIOTHORACIC SURGERY OFFICE NOTE  Referring Provider is [name], [name] November, MD PCP is [name], [name], MD   HPI:  Patient returns to the office today for follow-up of stage D severe symptomatically aortic stenosis. She was originally seen in consultation on 08/23/2015. Since then she underwent CT angiography and she returns to the office today for follow-up with hopes to proceed with elective aortic valve replacement in the near future. She reports no new problems or complaints over the last few weeks.   Current Outpatient Prescriptions  Medication Sig Dispense Refill  . acetaminophen (TYLENOL) 325 MG tablet Take 650 mg by mouth every 6 (six) hours as needed.    Marland Kitchen aspirin 81 MG tablet Take 81 mg by mouth daily.    . Calcium Carbonate-Vitamin D (CALCIUM 600 + D PO) Take by mouth 2 (two) times daily.    . clopidogrel (PLAVIX) 75 MG tablet Take 75 mg by mouth daily with breakfast.    . diltiazem (CARDIZEM) 120 MG tablet Take 1 tablet (120 mg total) by mouth daily. 90 tablet 3  . ezetimibe (ZETIA) 10 MG tablet Take 10 mg by mouth daily.    . fluticasone (FLONASE) 50 MCG/ACT nasal spray Place 2 sprays into the nose daily as needed.    . Multiple Vitamin (MULTIVITAMIN) tablet Take 1 tablet by mouth daily.    . NON FORMULARY at bedtime. CPAP    . pantoprazole (PROTONIX) 40 MG tablet Take 40 mg by mouth daily.     Marland Kitchen PARoxetine (PAXIL) 20 MG tablet Take 20 mg by mouth daily.    . propranolol (INDERAL) 20 MG tablet Take 1 tablet (20 mg total) by mouth 3 (three) times daily as needed (tachycardia). 90 tablet 4   No current facility-administered medications for this visit.      Physical Exam:   BP 119/74 mmHg  Pulse 74  Resp 16  Ht 5\' 2"  (1.575 m)  Wt 167 lb (75.751 kg)  BMI 30.54 kg/m2  SpO2 94%  General:  Well-appearing  Chest:   Clear to auscultation  CV:   Regular rate and rhythm with  prominent systolic murmur  Incisions:  n/a  Abdomen:  Soft and nontender  Extremities:  Warm and well-perfused  Diagnostic Tests:  Cardiac TAVR CT TECHNIQUE: The patient was scanned on a Philips 256 scanner. A 120 kV retrospective scan was triggered in the descending thoracic aorta at 111 HU's. Gantry rotation speed was 270 msecs and collimation was .9 mm. 5 mg of iv Metoprolol and no nitro were given. The 3D data set was reconstructed in 5% intervals of the R-R cycle. Systolic and diastolic phases were analyzed on a dedicated work station using MPR, MIP and VRT modes. The patient received 80 cc of contrast. FINDINGS: Aortic Valve: Severely thickened and calcified bicuspid aortic valve. Severely restricted leaflet opening. Aorta: Normal size with no calcifications and no dissection. Sinotubular Junction: 28 x 26 mm Ascending Thoracic Aorta: 35 x 34 mm Aortic Arch: 30 x 27 mm Descending Thoracic Aorta: 23 x 23 mm Sinus of Valsalva Measurements: 34 x 26 mm Coronary Arteries: Normal coronary origin. Left dominance. Left main is free of plaque. LAD is a large vessel that gives rise to one diagonal branch and has no plaque. LCX artery is a large caliber artery  that gives rise to one OM branch, PDA and PLA. There is no plaque. RCA is a small to medium size non-dominant vessel with no significant plaque. Other findings: Normal pulmonary vein drainage into the left atrium (two on the right and two on the left). Fairly large left atrial appendage with no filling defect. No ASD or VSD seen. Normal pulmonary artery size. IMPRESSION: 1. Severely thickened and calcified bicuspid aortic valve with severely restricted leaflet opening. 2. Normal size of the thoracic aorta with no calcifications and no dissection. 3. Normal coronary origin with left dominance and no evidence of CAD. 4. No significant incidental findings. Ena Dawley Electronically Signed  By: Ena Dawley  On: 09/05/2015 17:30     Study Result     EXAM: OVER-READ INTERPRETATION CT CHEST  The following report is an over-read performed by radiologist Dr. Fonnie Birkenhead [facility] on 09/05/2015. This over-read does not include interpretation of cardiac or coronary anatomy or pathology. The coronary calcium score/coronary CTA interpretation by the cardiologist is attached.  COMPARISON: 11/17/2011.  FINDINGS: Tiny hiatal hernia. Extracardiac structures are otherwise unremarkable.  IMPRESSION: Tiny hiatal hernia. Extracardiac structures are otherwise unremarkable.  Electronically Signed: By: Lorin Picket M.D. On: 09/05/2015 09:39      Impression:  Patient has stage D severe symptomatic aortic stenosis. She describes a several month history of progressive symptoms of exertional shortness of breath and fatigue consistent with chronic diastolic congestive heart failure, New York Heart Association functional class II. I have partially reviewed the patient's recent transthoracic echocardiogram, diagnostic cardiac catheterization, and CT angiograms. Echocardiogram confirms the presence of severe thickening, calcification, fibrosis, and restricted leaflet mobility involving all 3 leaflets of the patient's aortic valve. Peak velocity across the aortic valve measured 5.1 m/s corresponding to a mean transvalvular gradient estimated 54 mmHg. Left ventricular systolic function remains normal with ejection fraction estimated 60-65%. Diagnostic cardiac catheterization is notable for the absence of significant coronary artery disease. I agree the patient would best be treated with aortic valve replacement.  Cardiac gated CT angiogram of the heart demonstrates the presence of a true bicuspid aortic valve with severe aortic stenosis.  Risks associated with conventional surgery would likely be only mildly elevated because of the patient's age and somewhat limited physical  mobility. Under the circumstances I feel that she would best be treated with conventional surgical aortic valve replacement. She might be a good candidate for use of a sutureless valve in an attempt to minimize the duration of cardiopulmonary bypass and thereby minimize morbidity and the risks of perioperative stroke.   Plan:  The patient was again counseled at length regarding treatment alternatives for management of severe symptomatic aortic stenosis. Surgical options were discussed at length including a comparison between conventional surgical aortic valve replacement using a typical sutured bioprosthetic tissue valve or a sutureless bioprosthetic tissue valve.  The differences between these 2 types of valves have been discussed.  Alternative surgical approaches have been discussed including conventional median sternotomy and minimally invasive techniques.  Other alternatives including transcatheter aortic valve replacement were discussed.  This discussion was placed in the context of the patient's particular circumstances, and as a result the patient specifically requests that their valve be replaced using a sutureless bioprosthetic tissue valve.  The patient understands and accepts all potential associated risks of surgery including but not limited to risk of death, stroke, myocardial infarction, congestive heart failure, respiratory failure, renal failure, pneumonia, bleeding requiring blood transfusion and or reexploration, arrhythmia, heart block or  bradycardia requiring permanent pacemaker, aortic dissection or other major vascular complication, pleural effusions or other delayed complications related to continued congestive heart failure, and other late complications related to valve replacement including structural valve deterioration and failure, thrombosis, endocarditis, or paravalvular leak.  We plan to proceed with surgery on Friday, 09/16/2015. The patient has been instructed not to take Plavix  in anticipation of her surgery. All of her questions have been addressed.   I spent in excess of 30 minutes during the conduct of this office consultation and >50% of this time involved direct face-to-face encounter with the patient for counseling and/or coordination of their care.    Valentina Gu. Roxy Manns, MD 09/12/2015 5:52 PM

## 2015-09-15 MED ORDER — DOPAMINE-DEXTROSE 3.2-5 MG/ML-% IV SOLN
0.0000 ug/kg/min | INTRAVENOUS | Status: DC
  Filled 2015-09-15: qty 250

## 2015-09-15 MED ORDER — DEXTROSE 5 % IV SOLN
1.5000 g | INTRAVENOUS | Status: AC
  Administered 2015-09-16: .75 g via INTRAVENOUS
  Administered 2015-09-16: 1.5 g via INTRAVENOUS
  Filled 2015-09-15: qty 1.5

## 2015-09-15 MED ORDER — PLASMA-LYTE 148 IV SOLN
INTRAVENOUS | Status: DC
  Filled 2015-09-15: qty 2.5

## 2015-09-15 MED ORDER — NITROGLYCERIN IN D5W 200-5 MCG/ML-% IV SOLN
2.0000 ug/min | INTRAVENOUS | Status: AC
  Administered 2015-09-16: 10 ug/min via INTRAVENOUS
  Filled 2015-09-15: qty 250

## 2015-09-15 MED ORDER — DEXMEDETOMIDINE HCL IN NACL 400 MCG/100ML IV SOLN
0.1000 ug/kg/h | INTRAVENOUS | Status: AC
  Administered 2015-09-16: .7 ug/kg/h via INTRAVENOUS
  Filled 2015-09-15: qty 100

## 2015-09-15 MED ORDER — SODIUM CHLORIDE 0.9 % IV SOLN
INTRAVENOUS | Status: DC
  Filled 2015-09-15: qty 30

## 2015-09-15 MED ORDER — CEFUROXIME SODIUM 750 MG IJ SOLR
750.0000 mg | INTRAMUSCULAR | Status: DC
  Filled 2015-09-15: qty 750

## 2015-09-15 MED ORDER — CHLORHEXIDINE GLUCONATE 0.12 % MT SOLN
15.0000 mL | Freq: Once | OROMUCOSAL | Status: AC
  Administered 2015-09-16: 15 mL via OROMUCOSAL
  Filled 2015-09-15 (×2): qty 15

## 2015-09-15 MED ORDER — SODIUM CHLORIDE 0.9 % IV SOLN
INTRAVENOUS | Status: AC
  Administered 2015-09-16: .8 [IU]/h via INTRAVENOUS
  Filled 2015-09-15: qty 2.5

## 2015-09-15 MED ORDER — EPINEPHRINE HCL 1 MG/ML IJ SOLN
0.0000 ug/min | INTRAVENOUS | Status: DC
  Filled 2015-09-15: qty 4

## 2015-09-15 MED ORDER — POTASSIUM CHLORIDE 2 MEQ/ML IV SOLN
80.0000 meq | INTRAVENOUS | Status: DC
  Filled 2015-09-15: qty 40

## 2015-09-15 MED ORDER — VANCOMYCIN HCL 10 G IV SOLR
1250.0000 mg | INTRAVENOUS | Status: AC
  Administered 2015-09-16: 1250 mg via INTRAVENOUS
  Filled 2015-09-15: qty 1250

## 2015-09-15 MED ORDER — MAGNESIUM SULFATE 50 % IJ SOLN
40.0000 meq | INTRAMUSCULAR | Status: DC
  Filled 2015-09-15 (×2): qty 10

## 2015-09-15 MED ORDER — VANCOMYCIN HCL 1000 MG IV SOLR
INTRAVENOUS | Status: AC
  Administered 2015-09-16: 1000 mL
  Filled 2015-09-15: qty 1000

## 2015-09-15 MED ORDER — AMINOCAPROIC ACID 250 MG/ML IV SOLN
INTRAVENOUS | Status: AC
  Administered 2015-09-16: 69.8 mL/h via INTRAVENOUS
  Filled 2015-09-15: qty 40

## 2015-09-15 MED ORDER — PHENYLEPHRINE HCL 10 MG/ML IJ SOLN
30.0000 ug/min | INTRAVENOUS | Status: DC
  Filled 2015-09-15: qty 2

## 2015-09-15 MED ORDER — METOPROLOL TARTRATE 12.5 MG HALF TABLET
12.5000 mg | ORAL_TABLET | Freq: Once | ORAL | Status: AC
  Administered 2015-09-16: 12.5 mg via ORAL
  Filled 2015-09-15: qty 1

## 2015-09-16 ENCOUNTER — Inpatient Hospital Stay (HOSPITAL_COMMUNITY)
Admission: RE | Admit: 2015-09-16 | Discharge: 2015-09-16 | Disposition: A | Discharge status: Home / Self Care | Payer: PPO | Source: Ambulatory Visit | Attending: Thoracic Surgery (Cardiothoracic Vascular Surgery) | Admitting: Thoracic Surgery (Cardiothoracic Vascular Surgery)

## 2015-09-16 ENCOUNTER — Inpatient Hospital Stay (HOSPITAL_COMMUNITY)
Admission: RE | Admit: 2015-09-16 | Discharge: 2015-09-22 | DRG: 220 | Disposition: A | Discharge status: Home Health | Payer: PPO | Source: Ambulatory Visit | Attending: Thoracic Surgery (Cardiothoracic Vascular Surgery) | Admitting: Thoracic Surgery (Cardiothoracic Vascular Surgery)

## 2015-09-16 ENCOUNTER — Inpatient Hospital Stay (HOSPITAL_COMMUNITY): Payer: PPO

## 2015-09-16 ENCOUNTER — Inpatient Hospital Stay (HOSPITAL_COMMUNITY): Payer: PPO | Admitting: Anesthesiology

## 2015-09-16 ENCOUNTER — Encounter (HOSPITAL_COMMUNITY)
Admission: RE | Disposition: A | Discharge status: Home Health | Payer: PPO | Source: Ambulatory Visit | Attending: Thoracic Surgery (Cardiothoracic Vascular Surgery)

## 2015-09-16 ENCOUNTER — Encounter (HOSPITAL_COMMUNITY): Payer: Self-pay | Admitting: *Deleted

## 2015-09-16 DIAGNOSIS — M8580 Other specified disorders of bone density and structure, unspecified site: Secondary | ICD-10-CM | POA: Diagnosis present

## 2015-09-16 DIAGNOSIS — K219 Gastro-esophageal reflux disease without esophagitis: Secondary | ICD-10-CM | POA: Diagnosis present

## 2015-09-16 DIAGNOSIS — D62 Acute posthemorrhagic anemia: Secondary | ICD-10-CM | POA: Diagnosis not present

## 2015-09-16 DIAGNOSIS — I5032 Chronic diastolic (congestive) heart failure: Secondary | ICD-10-CM | POA: Diagnosis present

## 2015-09-16 DIAGNOSIS — D509 Iron deficiency anemia, unspecified: Secondary | ICD-10-CM | POA: Diagnosis present

## 2015-09-16 DIAGNOSIS — Z8711 Personal history of peptic ulcer disease: Secondary | ICD-10-CM

## 2015-09-16 DIAGNOSIS — G4733 Obstructive sleep apnea (adult) (pediatric): Secondary | ICD-10-CM | POA: Diagnosis present

## 2015-09-16 DIAGNOSIS — I5030 Unspecified diastolic (congestive) heart failure: Secondary | ICD-10-CM | POA: Diagnosis not present

## 2015-09-16 DIAGNOSIS — Z853 Personal history of malignant neoplasm of breast: Secondary | ICD-10-CM | POA: Diagnosis not present

## 2015-09-16 DIAGNOSIS — Z8673 Personal history of transient ischemic attack (TIA), and cerebral infarction without residual deficits: Secondary | ICD-10-CM

## 2015-09-16 DIAGNOSIS — Z9049 Acquired absence of other specified parts of digestive tract: Secondary | ICD-10-CM

## 2015-09-16 DIAGNOSIS — F329 Major depressive disorder, single episode, unspecified: Secondary | ICD-10-CM | POA: Diagnosis present

## 2015-09-16 DIAGNOSIS — M1990 Unspecified osteoarthritis, unspecified site: Secondary | ICD-10-CM | POA: Diagnosis present

## 2015-09-16 DIAGNOSIS — Z79899 Other long term (current) drug therapy: Secondary | ICD-10-CM

## 2015-09-16 DIAGNOSIS — I4891 Unspecified atrial fibrillation: Secondary | ICD-10-CM | POA: Diagnosis not present

## 2015-09-16 DIAGNOSIS — Z7902 Long term (current) use of antithrombotics/antiplatelets: Secondary | ICD-10-CM

## 2015-09-16 DIAGNOSIS — R0602 Shortness of breath: Secondary | ICD-10-CM | POA: Diagnosis present

## 2015-09-16 DIAGNOSIS — J9811 Atelectasis: Secondary | ICD-10-CM

## 2015-09-16 DIAGNOSIS — D6959 Other secondary thrombocytopenia: Secondary | ICD-10-CM | POA: Diagnosis not present

## 2015-09-16 DIAGNOSIS — I350 Nonrheumatic aortic (valve) stenosis: Principal | ICD-10-CM | POA: Diagnosis present

## 2015-09-16 DIAGNOSIS — E785 Hyperlipidemia, unspecified: Secondary | ICD-10-CM | POA: Diagnosis present

## 2015-09-16 DIAGNOSIS — Z96653 Presence of artificial knee joint, bilateral: Secondary | ICD-10-CM | POA: Diagnosis present

## 2015-09-16 DIAGNOSIS — Z7982 Long term (current) use of aspirin: Secondary | ICD-10-CM

## 2015-09-16 DIAGNOSIS — I209 Angina pectoris, unspecified: Secondary | ICD-10-CM | POA: Diagnosis present

## 2015-09-16 DIAGNOSIS — Z953 Presence of xenogenic heart valve: Secondary | ICD-10-CM

## 2015-09-16 HISTORY — PX: TEE WITHOUT CARDIOVERSION: SHX5443

## 2015-09-16 HISTORY — DX: Presence of xenogenic heart valve: Z95.3

## 2015-09-16 HISTORY — PX: AORTIC VALVE REPLACEMENT: SHX41

## 2015-09-16 LAB — POCT I-STAT 3, ART BLOOD GAS (G3+)
ACID-BASE DEFICIT: 3 mmol/L — AB (ref 0.0–2.0)
ACID-BASE DEFICIT: 3 mmol/L — AB (ref 0.0–2.0)
Acid-Base Excess: 3 mmol/L — ABNORMAL HIGH (ref 0.0–2.0)
Acid-base deficit: 4 mmol/L — ABNORMAL HIGH (ref 0.0–2.0)
Acid-base deficit: 5 mmol/L — ABNORMAL HIGH (ref 0.0–2.0)
Acid-base deficit: 5 mmol/L — ABNORMAL HIGH (ref 0.0–2.0)
BICARBONATE: 22.2 meq/L (ref 20.0–24.0)
BICARBONATE: 21.4 meq/L (ref 20.0–24.0)
BICARBONATE: 23 meq/L (ref 20.0–24.0)
Bicarbonate: 21.3 mEq/L (ref 20.0–24.0)
Bicarbonate: 21.5 mEq/L (ref 20.0–24.0)
Bicarbonate: 27 mEq/L — ABNORMAL HIGH (ref 20.0–24.0)
O2 SAT: 94 %
O2 SAT: 95 %
O2 SAT: 98 %
O2 Saturation: 100 %
O2 Saturation: 94 %
O2 Saturation: 92 %
PCO2 ART: 35.6 mmHg (ref 35.0–45.0)
PCO2 ART: 37.7 mmHg (ref 35.0–45.0)
PCO2 ART: 41.4 mmHg (ref 35.0–45.0)
PCO2 ART: 43.2 mmHg (ref 35.0–45.0)
PCO2 ART: 43.5 mmHg (ref 35.0–45.0)
PH ART: 7.271 — AB (ref 7.350–7.450)
PH ART: 7.305 — AB (ref 7.350–7.450)
PH ART: 7.324 — AB (ref 7.350–7.450)
PH ART: 7.397 (ref 7.350–7.450)
PH ART: 7.462 — AB (ref 7.350–7.450)
PO2 ART: 100 mmHg (ref 80.0–100.0)
PO2 ART: 74 mmHg — AB (ref 80.0–100.0)
PO2 ART: 79 mmHg — AB (ref 80.0–100.0)
PO2 ART: 87 mmHg (ref 80.0–100.0)
Patient temperature: 35.9
Patient temperature: 37.7
Patient temperature: 37.7
Pressure control: 1 cmH2O
TCO2: 23 mmol/L (ref 0–100)
TCO2: 23 mmol/L (ref 0–100)
TCO2: 23 mmol/L (ref 0–100)
TCO2: 23 mmol/L (ref 0–100)
TCO2: 24 mmol/L (ref 0–100)
TCO2: 28 mmol/L (ref 0–100)
pCO2 arterial: 46.6 mmHg — ABNORMAL HIGH (ref 35.0–45.0)
pH, Arterial: 7.333 — ABNORMAL LOW (ref 7.350–7.450)
pO2, Arterial: 332 mmHg — ABNORMAL HIGH (ref 80.0–100.0)
pO2, Arterial: 76 mmHg — ABNORMAL LOW (ref 80.0–100.0)

## 2015-09-16 LAB — POCT I-STAT, CHEM 8
BUN: 17 mg/dL (ref 6–20)
BUN: 14 mg/dL (ref 6–20)
BUN: 16 mg/dL (ref 6–20)
BUN: 15 mg/dL (ref 6–20)
BUN: 16 mg/dL (ref 6–20)
BUN: 12 mg/dL (ref 6–20)
CALCIUM ION: 0.94 mmol/L — AB (ref 1.13–1.30)
CALCIUM ION: 1.13 mmol/L (ref 1.13–1.30)
CHLORIDE: 100 mmol/L — AB (ref 101–111)
CHLORIDE: 103 mmol/L (ref 101–111)
CHLORIDE: 104 mmol/L (ref 101–111)
CHLORIDE: 109 mmol/L (ref 101–111)
CREATININE: 0.6 mg/dL (ref 0.44–1.00)
Calcium, Ion: 1.21 mmol/L (ref 1.13–1.30)
Calcium, Ion: 1.23 mmol/L (ref 1.13–1.30)
Calcium, Ion: 1.04 mmol/L — ABNORMAL LOW (ref 1.13–1.30)
Calcium, Ion: 1.07 mmol/L — ABNORMAL LOW (ref 1.13–1.30)
Chloride: 106 mmol/L (ref 101–111)
Chloride: 107 mmol/L (ref 101–111)
Creatinine, Ser: 0.8 mg/dL (ref 0.44–1.00)
Creatinine, Ser: 0.6 mg/dL (ref 0.44–1.00)
Creatinine, Ser: 0.7 mg/dL (ref 0.44–1.00)
Creatinine, Ser: 0.8 mg/dL (ref 0.44–1.00)
Creatinine, Ser: 0.8 mg/dL (ref 0.44–1.00)
GLUCOSE: 90 mg/dL (ref 65–99)
GLUCOSE: 122 mg/dL — AB (ref 65–99)
Glucose, Bld: 98 mg/dL (ref 65–99)
Glucose, Bld: 83 mg/dL (ref 65–99)
Glucose, Bld: 169 mg/dL — ABNORMAL HIGH (ref 65–99)
Glucose, Bld: 134 mg/dL — ABNORMAL HIGH (ref 65–99)
HCT: 20 % — ABNORMAL LOW (ref 36.0–46.0)
HCT: 20 % — ABNORMAL LOW (ref 36.0–46.0)
HCT: 33 % — ABNORMAL LOW (ref 36.0–46.0)
HEMATOCRIT: 27 % — AB (ref 36.0–46.0)
HEMATOCRIT: 26 % — AB (ref 36.0–46.0)
HEMATOCRIT: 20 % — AB (ref 36.0–46.0)
HEMOGLOBIN: 9.2 g/dL — AB (ref 12.0–15.0)
HEMOGLOBIN: 8.8 g/dL — AB (ref 12.0–15.0)
Hemoglobin: 6.8 g/dL — CL (ref 12.0–15.0)
Hemoglobin: 6.8 g/dL — CL (ref 12.0–15.0)
Hemoglobin: 6.8 g/dL — CL (ref 12.0–15.0)
Hemoglobin: 11.2 g/dL — ABNORMAL LOW (ref 12.0–15.0)
POTASSIUM: 3.6 mmol/L (ref 3.5–5.1)
POTASSIUM: 3.8 mmol/L (ref 3.5–5.1)
POTASSIUM: 4.9 mmol/L (ref 3.5–5.1)
POTASSIUM: 5 mmol/L (ref 3.5–5.1)
Potassium: 3.9 mmol/L (ref 3.5–5.1)
Potassium: 4 mmol/L (ref 3.5–5.1)
SODIUM: 142 mmol/L (ref 135–145)
SODIUM: 141 mmol/L (ref 135–145)
SODIUM: 143 mmol/L (ref 135–145)
SODIUM: 139 mmol/L (ref 135–145)
SODIUM: 144 mmol/L (ref 135–145)
Sodium: 140 mmol/L (ref 135–145)
TCO2: 23 mmol/L (ref 0–100)
TCO2: 25 mmol/L (ref 0–100)
TCO2: 25 mmol/L (ref 0–100)
TCO2: 23 mmol/L (ref 0–100)
TCO2: 26 mmol/L (ref 0–100)
TCO2: 23 mmol/L (ref 0–100)

## 2015-09-16 LAB — CBC
HEMATOCRIT: 19.9 % — AB (ref 36.0–46.0)
HEMATOCRIT: 35 % — AB (ref 36.0–46.0)
HEMATOCRIT: 33.7 % — AB (ref 36.0–46.0)
HEMOGLOBIN: 6.3 g/dL — AB (ref 12.0–15.0)
HEMOGLOBIN: 11.5 g/dL — AB (ref 12.0–15.0)
HEMOGLOBIN: 10.9 g/dL — AB (ref 12.0–15.0)
MCH: 27.4 pg (ref 26.0–34.0)
MCH: 28.8 pg (ref 26.0–34.0)
MCH: 27.8 pg (ref 26.0–34.0)
MCHC: 31.7 g/dL (ref 30.0–36.0)
MCHC: 32.9 g/dL (ref 30.0–36.0)
MCHC: 32.3 g/dL (ref 30.0–36.0)
MCV: 86.5 fL (ref 78.0–100.0)
MCV: 87.7 fL (ref 78.0–100.0)
MCV: 86 fL (ref 78.0–100.0)
Platelets: 61 10*3/uL — ABNORMAL LOW (ref 150–400)
Platelets: 105 10*3/uL — ABNORMAL LOW (ref 150–400)
Platelets: 118 10*3/uL — ABNORMAL LOW (ref 150–400)
RBC: 2.3 MIL/uL — AB (ref 3.87–5.11)
RBC: 3.99 MIL/uL (ref 3.87–5.11)
RBC: 3.92 MIL/uL (ref 3.87–5.11)
RDW: 14.1 % (ref 11.5–15.5)
RDW: 14.1 % (ref 11.5–15.5)
RDW: 14.1 % (ref 11.5–15.5)
WBC: 4.6 10*3/uL (ref 4.0–10.5)
WBC: 7.2 10*3/uL (ref 4.0–10.5)
WBC: 9.3 10*3/uL (ref 4.0–10.5)

## 2015-09-16 LAB — HEMOGLOBIN AND HEMATOCRIT, BLOOD
HCT: 19.3 % — ABNORMAL LOW (ref 36.0–46.0)
Hemoglobin: 6.1 g/dL — CL (ref 12.0–15.0)

## 2015-09-16 LAB — GLUCOSE, CAPILLARY
GLUCOSE-CAPILLARY: 135 mg/dL — AB (ref 65–99)
GLUCOSE-CAPILLARY: 113 mg/dL — AB (ref 65–99)
Glucose-Capillary: 146 mg/dL — ABNORMAL HIGH (ref 65–99)

## 2015-09-16 LAB — CREATININE, SERUM: CREATININE: 0.74 mg/dL (ref 0.44–1.00)

## 2015-09-16 LAB — APTT: aPTT: 41 seconds — ABNORMAL HIGH (ref 24–37)

## 2015-09-16 LAB — PROTIME-INR
INR: 1.62 — ABNORMAL HIGH (ref 0.00–1.49)
Prothrombin Time: 19.3 seconds — ABNORMAL HIGH (ref 11.6–15.2)

## 2015-09-16 LAB — POCT I-STAT 4, (NA,K, GLUC, HGB,HCT)
Glucose, Bld: 155 mg/dL — ABNORMAL HIGH (ref 65–99)
HEMATOCRIT: 31 % — AB (ref 36.0–46.0)
HEMOGLOBIN: 10.5 g/dL — AB (ref 12.0–15.0)
Potassium: 4.5 mmol/L (ref 3.5–5.1)
SODIUM: 143 mmol/L (ref 135–145)

## 2015-09-16 LAB — PREPARE RBC (CROSSMATCH)

## 2015-09-16 LAB — MAGNESIUM: MAGNESIUM: 2.8 mg/dL — AB (ref 1.7–2.4)

## 2015-09-16 LAB — PLATELET COUNT: Platelets: 40 10*3/uL — ABNORMAL LOW (ref 150–400)

## 2015-09-16 SURGERY — REPLACEMENT, AORTIC VALVE, OPEN
Anesthesia: General | Site: Chest

## 2015-09-16 MED ORDER — MORPHINE SULFATE (PF) 2 MG/ML IV SOLN
1.0000 mg | INTRAVENOUS | Status: DC | PRN
  Administered 2015-09-17: 2 mg via INTRAVENOUS
  Filled 2015-09-16: qty 1

## 2015-09-16 MED ORDER — HEPARIN SODIUM (PORCINE) 1000 UNIT/ML IJ SOLN
INTRAMUSCULAR | Status: DC | PRN
  Administered 2015-09-16: 20000 [IU] via INTRAVENOUS

## 2015-09-16 MED ORDER — SODIUM CHLORIDE 0.9 % IJ SOLN
3.0000 mL | Freq: Two times a day (BID) | INTRAMUSCULAR | Status: DC
  Administered 2015-09-17 – 2015-09-18 (×4): 3 mL via INTRAVENOUS

## 2015-09-16 MED ORDER — DEXTROSE 5 % IV SOLN
0.0000 ug/min | INTRAVENOUS | Status: DC
  Filled 2015-09-16: qty 2

## 2015-09-16 MED ORDER — PROPOFOL 10 MG/ML IV BOLUS
INTRAVENOUS | Status: DC | PRN
  Administered 2015-09-16: 100 mg via INTRAVENOUS

## 2015-09-16 MED ORDER — PROTAMINE SULFATE 10 MG/ML IV SOLN
INTRAVENOUS | Status: AC
  Filled 2015-09-16: qty 5

## 2015-09-16 MED ORDER — ACETAMINOPHEN 160 MG/5ML PO SOLN: 650.0000 mg | Freq: Once | ORAL | Status: AC

## 2015-09-16 MED ORDER — NITROGLYCERIN IN D5W 200-5 MCG/ML-% IV SOLN: 0.0000 ug/min | INTRAVENOUS | Status: DC

## 2015-09-16 MED ORDER — ASPIRIN EC 325 MG PO TBEC
325.0000 mg | DELAYED_RELEASE_TABLET | Freq: Every day | ORAL | Status: AC
  Administered 2015-09-17: 325 mg via ORAL
  Filled 2015-09-16: qty 1

## 2015-09-16 MED ORDER — CHLORHEXIDINE GLUCONATE 0.12 % MT SOLN
15.0000 mL | OROMUCOSAL | Status: AC
  Administered 2015-09-16: 15 mL via OROMUCOSAL
  Filled 2015-09-16: qty 15

## 2015-09-16 MED ORDER — LACTATED RINGERS IV SOLN
500.0000 mL | Freq: Once | INTRAVENOUS | Status: AC | PRN
  Administered 2015-09-16: 500 mL via INTRAVENOUS

## 2015-09-16 MED ORDER — SODIUM CHLORIDE 0.45 % IV SOLN
INTRAVENOUS | Status: DC | PRN
  Administered 2015-09-16: 20 mL/h via INTRAVENOUS

## 2015-09-16 MED ORDER — ALBUMIN HUMAN 5 % IV SOLN
250.0000 mL | INTRAVENOUS | Status: AC | PRN
  Administered 2015-09-16 (×4): 250 mL via INTRAVENOUS
  Filled 2015-09-16: qty 250

## 2015-09-16 MED ORDER — FENTANYL CITRATE (PF) 250 MCG/5ML IJ SOLN
INTRAMUSCULAR | Status: AC
  Filled 2015-09-16: qty 20

## 2015-09-16 MED ORDER — BISACODYL 10 MG RE SUPP: 10.0000 mg | Freq: Every day | RECTAL | Status: DC

## 2015-09-16 MED ORDER — POTASSIUM CHLORIDE 10 MEQ/50ML IV SOLN: 10.0000 meq | INTRAVENOUS | Status: DC

## 2015-09-16 MED ORDER — DEXMEDETOMIDINE HCL IN NACL 200 MCG/50ML IV SOLN
0.0000 ug/kg/h | INTRAVENOUS | Status: DC
  Administered 2015-09-16: 0.7 ug/kg/h via INTRAVENOUS
  Filled 2015-09-16 (×2): qty 50

## 2015-09-16 MED ORDER — CHLORHEXIDINE GLUCONATE 0.12 % MT SOLN
15.0000 mL | Freq: Two times a day (BID) | OROMUCOSAL | Status: DC
  Administered 2015-09-16: 15 mL via OROMUCOSAL
  Filled 2015-09-16: qty 15

## 2015-09-16 MED ORDER — LACTATED RINGERS IV SOLN: INTRAVENOUS | Status: DC

## 2015-09-16 MED ORDER — ROCURONIUM BROMIDE 50 MG/5ML IV SOLN
INTRAVENOUS | Status: AC
  Filled 2015-09-16: qty 3

## 2015-09-16 MED ORDER — SODIUM CHLORIDE 0.9 % IV SOLN
INTRAVENOUS | Status: AC
  Administered 2015-09-16: 100 mL/h via INTRAVENOUS

## 2015-09-16 MED ORDER — DOCUSATE SODIUM 100 MG PO CAPS
200.0000 mg | ORAL_CAPSULE | Freq: Every day | ORAL | Status: DC
  Administered 2015-09-17 – 2015-09-19 (×3): 200 mg via ORAL
  Filled 2015-09-16 (×4): qty 2

## 2015-09-16 MED ORDER — SODIUM CHLORIDE 0.9 % IJ SOLN
INTRAMUSCULAR | Status: AC
  Filled 2015-09-16: qty 20

## 2015-09-16 MED ORDER — CETYLPYRIDINIUM CHLORIDE 0.05 % MT LIQD
7.0000 mL | Freq: Two times a day (BID) | OROMUCOSAL | Status: DC
  Administered 2015-09-16: 7 mL via OROMUCOSAL

## 2015-09-16 MED ORDER — EPHEDRINE SULFATE 50 MG/ML IJ SOLN
INTRAMUSCULAR | Status: AC
  Filled 2015-09-16: qty 1

## 2015-09-16 MED ORDER — MIDAZOLAM HCL 2 MG/2ML IJ SOLN: 2.0000 mg | INTRAMUSCULAR | Status: DC | PRN

## 2015-09-16 MED ORDER — ACETAMINOPHEN 500 MG PO TABS
1000.0000 mg | ORAL_TABLET | Freq: Four times a day (QID) | ORAL | Status: AC
  Administered 2015-09-17 – 2015-09-21 (×16): 1000 mg via ORAL
  Filled 2015-09-16 (×18): qty 2

## 2015-09-16 MED ORDER — MIDAZOLAM HCL 10 MG/2ML IJ SOLN
INTRAMUSCULAR | Status: AC
  Filled 2015-09-16: qty 2

## 2015-09-16 MED ORDER — SODIUM CHLORIDE 0.9 % IR SOLN
Status: DC | PRN
  Administered 2015-09-16: 6000 mL

## 2015-09-16 MED ORDER — ONDANSETRON HCL 4 MG/2ML IJ SOLN
4.0000 mg | Freq: Four times a day (QID) | INTRAMUSCULAR | Status: DC | PRN
  Administered 2015-09-16 – 2015-09-18 (×3): 4 mg via INTRAVENOUS
  Filled 2015-09-16 (×3): qty 2

## 2015-09-16 MED ORDER — SODIUM CHLORIDE 0.9 % IV SOLN
INTRAVENOUS | Status: DC
  Administered 2015-09-16: 20 mL/h via INTRAVENOUS

## 2015-09-16 MED ORDER — VANCOMYCIN HCL IN DEXTROSE 1-5 GM/200ML-% IV SOLN
1000.0000 mg | Freq: Once | INTRAVENOUS | Status: AC
  Administered 2015-09-16: 1000 mg via INTRAVENOUS
  Filled 2015-09-16: qty 200

## 2015-09-16 MED ORDER — FENTANYL CITRATE (PF) 100 MCG/2ML IJ SOLN
INTRAMUSCULAR | Status: DC | PRN
  Administered 2015-09-16: 750 ug via INTRAVENOUS
  Administered 2015-09-16: 250 ug via INTRAVENOUS

## 2015-09-16 MED ORDER — SODIUM CHLORIDE 0.9 % IJ SOLN: 3.0000 mL | INTRAMUSCULAR | Status: DC | PRN

## 2015-09-16 MED ORDER — FAMOTIDINE IN NACL 20-0.9 MG/50ML-% IV SOLN
20.0000 mg | Freq: Two times a day (BID) | INTRAVENOUS | Status: AC
  Administered 2015-09-16 (×2): 20 mg via INTRAVENOUS
  Filled 2015-09-16 (×2): qty 50

## 2015-09-16 MED ORDER — LACTATED RINGERS IV SOLN
INTRAVENOUS | Status: DC | PRN
  Administered 2015-09-16 (×4): via INTRAVENOUS

## 2015-09-16 MED ORDER — METOPROLOL TARTRATE 12.5 MG HALF TABLET: 12.5000 mg | ORAL_TABLET | Freq: Two times a day (BID) | ORAL | Status: DC

## 2015-09-16 MED ORDER — DEXTROSE 5 % IV SOLN
1.5000 g | Freq: Two times a day (BID) | INTRAVENOUS | Status: AC
  Administered 2015-09-16 – 2015-09-18 (×4): 1.5 g via INTRAVENOUS
  Filled 2015-09-16 (×4): qty 1.5

## 2015-09-16 MED ORDER — DOPAMINE-DEXTROSE 3.2-5 MG/ML-% IV SOLN
3.0000 ug/kg/min | INTRAVENOUS | Status: DC
  Administered 2015-09-16: 3 ug/kg/min via INTRAVENOUS
  Filled 2015-09-16: qty 250

## 2015-09-16 MED ORDER — INSULIN REGULAR BOLUS VIA INFUSION
0.0000 [IU] | Freq: Three times a day (TID) | INTRAVENOUS | Status: DC
  Filled 2015-09-16: qty 10

## 2015-09-16 MED ORDER — ACETAMINOPHEN 160 MG/5ML PO SOLN
1000.0000 mg | Freq: Four times a day (QID) | ORAL | Status: DC
  Administered 2015-09-17: 1000 mg
  Filled 2015-09-16: qty 40.6

## 2015-09-16 MED ORDER — ACETAMINOPHEN 650 MG RE SUPP
650.0000 mg | Freq: Once | RECTAL | Status: AC
  Administered 2015-09-16: 650 mg via RECTAL

## 2015-09-16 MED ORDER — SODIUM CHLORIDE 0.9 % IJ SOLN
OROMUCOSAL | Status: DC | PRN
  Administered 2015-09-16 (×3): 4 mL via TOPICAL

## 2015-09-16 MED ORDER — MIDAZOLAM HCL 5 MG/5ML IJ SOLN
INTRAMUSCULAR | Status: DC | PRN
  Administered 2015-09-16 (×2): 5 mg via INTRAVENOUS

## 2015-09-16 MED ORDER — MORPHINE SULFATE (PF) 2 MG/ML IV SOLN
2.0000 mg | INTRAVENOUS | Status: DC | PRN
  Administered 2015-09-17: 2 mg via INTRAVENOUS
  Filled 2015-09-16 (×2): qty 1

## 2015-09-16 MED ORDER — METOPROLOL TARTRATE 1 MG/ML IV SOLN
2.5000 mg | INTRAVENOUS | Status: DC | PRN
  Administered 2015-09-18: 5 mg via INTRAVENOUS
  Filled 2015-09-16: qty 5

## 2015-09-16 MED ORDER — PANTOPRAZOLE SODIUM 40 MG PO TBEC: 40.0000 mg | DELAYED_RELEASE_TABLET | Freq: Every day | ORAL | Status: DC

## 2015-09-16 MED ORDER — METOPROLOL TARTRATE 25 MG/10 ML ORAL SUSPENSION: 12.5000 mg | Freq: Two times a day (BID) | ORAL | Status: DC

## 2015-09-16 MED ORDER — BISACODYL 5 MG PO TBEC
10.0000 mg | DELAYED_RELEASE_TABLET | Freq: Every day | ORAL | Status: DC
  Administered 2015-09-17 – 2015-09-19 (×3): 10 mg via ORAL
  Filled 2015-09-16 (×4): qty 2

## 2015-09-16 MED ORDER — PROTAMINE SULFATE 10 MG/ML IV SOLN
INTRAVENOUS | Status: DC | PRN
  Administered 2015-09-16: 140 mg via INTRAVENOUS
  Administered 2015-09-16: 50 mg via INTRAVENOUS

## 2015-09-16 MED ORDER — ASPIRIN 81 MG PO CHEW: 324.0000 mg | CHEWABLE_TABLET | Freq: Every day | ORAL | Status: AC

## 2015-09-16 MED ORDER — TRAMADOL HCL 50 MG PO TABS: 50.0000 mg | ORAL_TABLET | ORAL | Status: DC | PRN

## 2015-09-16 MED ORDER — HEPARIN SODIUM (PORCINE) 1000 UNIT/ML IJ SOLN
INTRAMUSCULAR | Status: AC
  Filled 2015-09-16: qty 1

## 2015-09-16 MED ORDER — ROCURONIUM BROMIDE 100 MG/10ML IV SOLN
INTRAVENOUS | Status: DC | PRN
  Administered 2015-09-16 (×3): 50 mg via INTRAVENOUS

## 2015-09-16 MED ORDER — ARTIFICIAL TEARS OP OINT
TOPICAL_OINTMENT | OPHTHALMIC | Status: AC
  Filled 2015-09-16: qty 3.5

## 2015-09-16 MED ORDER — MAGNESIUM SULFATE 4 GM/100ML IV SOLN
4.0000 g | Freq: Once | INTRAVENOUS | Status: AC
  Administered 2015-09-16: 4 g via INTRAVENOUS
  Filled 2015-09-16: qty 100

## 2015-09-16 MED ORDER — OXYCODONE HCL 5 MG PO TABS: 5.0000 mg | ORAL_TABLET | ORAL | Status: DC | PRN

## 2015-09-16 MED ORDER — SODIUM CHLORIDE 0.9 % IV SOLN
250.0000 mL | INTRAVENOUS | Status: DC
  Administered 2015-09-17: 250 mL via INTRAVENOUS

## 2015-09-16 MED ORDER — SODIUM CHLORIDE 0.9 % IV SOLN
INTRAVENOUS | Status: DC
  Administered 2015-09-16: 2.1 [IU]/h via INTRAVENOUS
  Filled 2015-09-16: qty 2.5

## 2015-09-16 MED ORDER — SODIUM CHLORIDE 0.9 % IV SOLN: 10.0000 mL/h | Freq: Once | INTRAVENOUS | Status: DC

## 2015-09-16 MED ORDER — DEXTROSE 5 % IV SOLN
10.0000 mg | INTRAVENOUS | Status: DC | PRN
  Administered 2015-09-16: 20 ug/min via INTRAVENOUS

## 2015-09-16 MED ORDER — PROPOFOL 10 MG/ML IV BOLUS
INTRAVENOUS | Status: AC
  Filled 2015-09-16: qty 20

## 2015-09-16 MED ORDER — ARTIFICIAL TEARS OP OINT
TOPICAL_OINTMENT | OPHTHALMIC | Status: DC | PRN
  Administered 2015-09-16: 1 via OPHTHALMIC

## 2015-09-16 MED FILL — Heparin Sodium (Porcine) Inj 1000 Unit/ML: INTRAMUSCULAR | Qty: 30 | Status: AC

## 2015-09-16 MED FILL — Magnesium Sulfate Inj 50%: INTRAMUSCULAR | Qty: 10 | Status: AC

## 2015-09-16 MED FILL — Potassium Chloride Inj 2 mEq/ML: INTRAVENOUS | Qty: 40 | Status: AC

## 2015-09-16 SURGICAL SUPPLY — 99 items
ADAPTER CARDIO PERF ANTE/RETRO (ADAPTER) ×3 IMPLANT
BLADE STERNUM SYSTEM 6 (BLADE) ×3 IMPLANT
BLADE SURG 11 STRL SS (BLADE) ×3 IMPLANT
CANISTER SUCTION 2500CC (MISCELLANEOUS) ×3 IMPLANT
CANNULA EZ GLIDE AORTIC 21FR (CANNULA) ×3 IMPLANT
CANNULA GUNDRY RCSP 15FR (MISCELLANEOUS) ×3 IMPLANT
CANNULA SOFTFLOW AORTIC 7M21FR (CANNULA) ×3 IMPLANT
CATH CPB KIT OWEN (MISCELLANEOUS) ×3 IMPLANT
CATH HEART VENT LEFT (CATHETERS) ×2 IMPLANT
CATH THORACIC 36FR RT ANG (CATHETERS) ×3 IMPLANT
CONN ST 1/4X3/8  BEN (MISCELLANEOUS) ×2
CONN ST 1/4X3/8 BEN (MISCELLANEOUS) ×4 IMPLANT
CONT SPEC 4OZ CLIKSEAL STRL BL (MISCELLANEOUS) ×3 IMPLANT
COVER SURGICAL LIGHT HANDLE (MISCELLANEOUS) ×3 IMPLANT
CRADLE DONUT ADULT HEAD (MISCELLANEOUS) ×3 IMPLANT
DEVICE SUT CK QUICK LOAD INDV (Prosthesis & Implant Heart) ×9 IMPLANT
DRAIN CHANNEL 32F RND 10.7 FF (WOUND CARE) ×3 IMPLANT
DRAPE BILATERAL SPLIT (DRAPES) IMPLANT
DRAPE CARDIOVASCULAR INCISE (DRAPES)
DRAPE CV SPLIT W-CLR ANES SCRN (DRAPES) IMPLANT
DRAPE INCISE IOBAN 66X45 STRL (DRAPES) ×6 IMPLANT
DRAPE SLUSH/WARMER DISC (DRAPES) ×3 IMPLANT
DRAPE SRG 135X102X78XABS (DRAPES) IMPLANT
DRSG AQUACEL AG ADV 3.5X14 (GAUZE/BANDAGES/DRESSINGS) ×3 IMPLANT
DRSG COVADERM 4X14 (GAUZE/BANDAGES/DRESSINGS) ×3 IMPLANT
ELECT REM PT RETURN 9FT ADLT (ELECTROSURGICAL) ×6
ELECTRODE REM PT RTRN 9FT ADLT (ELECTROSURGICAL) ×4 IMPLANT
GAUZE SPONGE 4X4 12PLY STRL (GAUZE/BANDAGES/DRESSINGS) ×6 IMPLANT
GLOVE BIO SURGEON STRL SZ 6 (GLOVE) ×9 IMPLANT
GLOVE BIO SURGEON STRL SZ 6.5 (GLOVE) ×12 IMPLANT
GLOVE BIO SURGEON STRL SZ7 (GLOVE) IMPLANT
GLOVE BIO SURGEON STRL SZ7.5 (GLOVE) IMPLANT
GLOVE BIOGEL PI IND STRL 6.5 (GLOVE) ×6 IMPLANT
GLOVE BIOGEL PI INDICATOR 6.5 (GLOVE) ×3
GLOVE ORTHO TXT STRL SZ7.5 (GLOVE) ×9 IMPLANT
GOWN STRL REUS W/ TWL LRG LVL3 (GOWN DISPOSABLE) ×8 IMPLANT
GOWN STRL REUS W/TWL LRG LVL3 (GOWN DISPOSABLE) ×4
HEMOSTAT POWDER SURGIFOAM 1G (HEMOSTASIS) ×9 IMPLANT
INSERT FOGARTY XLG (MISCELLANEOUS) ×3 IMPLANT
IV NS 1000ML (IV SOLUTION) ×1
IV NS 1000ML BAXH (IV SOLUTION) ×2 IMPLANT
IV NS IRRIG 3000ML ARTHROMATIC (IV SOLUTION) ×3 IMPLANT
KIT BASIN OR (CUSTOM PROCEDURE TRAY) ×3 IMPLANT
KIT DEVICE SUT COR-KNOT MIS 5 (INSTRUMENTS) ×3 IMPLANT
KIT ROOM TURNOVER OR (KITS) ×3 IMPLANT
KIT SUCTION CATH 14FR (SUCTIONS) ×15 IMPLANT
LEAD PACING MYOCARDI (MISCELLANEOUS) ×3 IMPLANT
NS IRRIG 1000ML POUR BTL (IV SOLUTION) ×15 IMPLANT
PACK OPEN HEART (CUSTOM PROCEDURE TRAY) ×3 IMPLANT
PAD ARMBOARD 7.5X6 YLW CONV (MISCELLANEOUS) ×6 IMPLANT
SET CARDIOPLEGIA MPS 5001102 (MISCELLANEOUS) ×3 IMPLANT
SET IRRIG TUBING LAPAROSCOPIC (IRRIGATION / IRRIGATOR) ×3 IMPLANT
SOLUTION ANTI FOG 6CC (MISCELLANEOUS) ×3 IMPLANT
SPONGE GAUZE 4X4 12PLY STER LF (GAUZE/BANDAGES/DRESSINGS) ×3 IMPLANT
SUT BONE WAX W31G (SUTURE) ×3 IMPLANT
SUT ETHIBON 2 0 V 52N 30 (SUTURE) ×6 IMPLANT
SUT ETHIBON EXCEL 2-0 V-5 (SUTURE) IMPLANT
SUT ETHIBOND 2 0 SH (SUTURE) ×4
SUT ETHIBOND 2 0 SH 36X2 (SUTURE) ×8 IMPLANT
SUT ETHIBOND 2 0 V4 (SUTURE) IMPLANT
SUT ETHIBOND 2 0V4 GREEN (SUTURE) IMPLANT
SUT ETHIBOND 4 0 RB 1 (SUTURE) IMPLANT
SUT ETHIBOND V-5 VALVE (SUTURE) IMPLANT
SUT ETHIBOND X763 2 0 SH 1 (SUTURE) ×18 IMPLANT
SUT MNCRL AB 3-0 PS2 18 (SUTURE) ×6 IMPLANT
SUT PDS AB 1 CTX 36 (SUTURE) ×6 IMPLANT
SUT PROLENE 3 0 SH DA (SUTURE) ×18 IMPLANT
SUT PROLENE 3 0 SH1 36 (SUTURE) ×3 IMPLANT
SUT PROLENE 4 0 RB 1 (SUTURE) ×7
SUT PROLENE 4 0 SH DA (SUTURE) ×3 IMPLANT
SUT PROLENE 4-0 RB1 .5 CRCL 36 (SUTURE) ×14 IMPLANT
SUT PROLENE 5 0 C 1 36 (SUTURE) ×6 IMPLANT
SUT PROLENE 6 0 C 1 30 (SUTURE) ×6 IMPLANT
SUT SILK  1 MH (SUTURE) ×5
SUT SILK 1 MH (SUTURE) ×10 IMPLANT
SUT SILK 1 TIES 10X30 (SUTURE) ×3 IMPLANT
SUT SILK 2 0 SH CR/8 (SUTURE) ×9 IMPLANT
SUT SILK 2 0 TIES 10X30 (SUTURE) ×3 IMPLANT
SUT SILK 2 0 TIES 17X18 (SUTURE) ×1
SUT SILK 2-0 18XBRD TIE BLK (SUTURE) ×2 IMPLANT
SUT SILK 3 0 SH CR/8 (SUTURE) IMPLANT
SUT SILK 4 0 TIE 10X30 (SUTURE) ×6 IMPLANT
SUT STEEL 6MS V (SUTURE) IMPLANT
SUT TEM PAC WIRE 2 0 SH (SUTURE) ×12 IMPLANT
SUT VIC AB 1 CTX 36 (SUTURE) ×1
SUT VIC AB 1 CTX36XBRD ANBCTR (SUTURE) ×2 IMPLANT
SUT VIC AB 2-0 CTX 27 (SUTURE) ×6 IMPLANT
SUT VIC AB 3-0 X1 27 (SUTURE) ×6 IMPLANT
SUTURE E-PAK OPEN HEART (SUTURE) ×3 IMPLANT
SYSTEM SAHARA CHEST DRAIN ATS (WOUND CARE) ×3 IMPLANT
TAPE CLOTH SURG 4X10 WHT LF (GAUZE/BANDAGES/DRESSINGS) ×3 IMPLANT
TAPE PAPER 2X10 WHT MICROPORE (GAUZE/BANDAGES/DRESSINGS) ×3 IMPLANT
TOWEL OR 17X24 6PK STRL BLUE (TOWEL DISPOSABLE) ×6 IMPLANT
TOWEL OR 17X26 10 PK STRL BLUE (TOWEL DISPOSABLE) ×6 IMPLANT
TRAY FOLEY IC TEMP SENS 16FR (CATHETERS) ×3 IMPLANT
UNDERPAD 30X30 INCONTINENT (UNDERPADS AND DIAPERS) ×3 IMPLANT
VALVE SYSTEM EDWS INTUITY 23A (Prosthesis & Implant Heart) ×3 IMPLANT
VENT LEFT HEART 12002 (CATHETERS) ×3
WATER STERILE IRR 1000ML POUR (IV SOLUTION) ×6 IMPLANT

## 2015-09-16 NOTE — Interval H&P Note (Signed)
History and Physical Interval Note:  09/16/2015 5:34 AM  Kayla Gray  has presented today for surgery, with the diagnosis of AS  The various methods of treatment have been discussed with the patient and family. After consideration of risks, benefits and other options for treatment, the patient has consented to  Procedure(s): AORTIC VALVE REPLACEMENT (AVR) (N/A) TRANSESOPHAGEAL ECHOCARDIOGRAM (TEE) (N/A) as a surgical intervention .  The patient's history has been reviewed, patient examined, no change in status, stable for surgery.  I have reviewed the patient's chart and labs.  Questions were answered to the patient's satisfaction.     Rexene Alberts

## 2015-09-16 NOTE — Procedures (Signed)
Pt assessed for possible extubation per Cardiac Weaning Protocol and pt placed back on full vent support due to failure.  ABG (7.27/46.4/74/21.4),  NIF (-35), VC (758mL).

## 2015-09-16 NOTE — Anesthesia Procedure Notes (Addendum)
Procedure Name: Intubation Date/Time: 09/16/2015 7:53 AM Performed by: Mariea Clonts Pre-anesthesia Checklist: Patient identified, Timeout performed, Emergency Drugs available, Suction available and Patient being monitored Patient Re-evaluated:Patient Re-evaluated prior to inductionOxygen Delivery Method: Circle system utilized Preoxygenation: Pre-oxygenation with 100% oxygen Intubation Type: IV induction Ventilation: Oral airway inserted - appropriate to patient size and Mask ventilation with difficulty Laryngoscope Size: Miller and 2 Grade View: Grade II Tube type: Oral Tube size: 8.0 mm Number of attempts: 1 Placement Confirmation: ETT inserted through vocal cords under direct vision,  breath sounds checked- equal and bilateral and positive ETCO2 Tube secured with: Tape Dental Injury: Teeth and Oropharynx as per pre-operative assessment       Right IJ vein with needle in place

## 2015-09-16 NOTE — Anesthesia Postprocedure Evaluation (Signed)
Anesthesia Post Note  Patient: Kayla Gray  Procedure(s) Performed: Procedure(s) (LRB): AORTIC VALVE REPLACEMENT (AVR) (N/A) TRANSESOPHAGEAL ECHOCARDIOGRAM (TEE) (N/A)  Patient location during evaluation: SICU Anesthesia Type: General Level of consciousness: sedated Pain management: pain level controlled Vital Signs Assessment: post-procedure vital signs reviewed and stable Respiratory status: patient remains intubated per anesthesia plan Cardiovascular status: stable Anesthetic complications: no    Last Vitals:  Filed Vitals:   09/16/15 0556 09/16/15 1250  BP: 153/77 126/86  Pulse: 73 81  Temp: 36.8 C   Resp: 16 12    Last Pain: There were no vitals filed for this visit.               [place]

## 2015-09-16 NOTE — Transfer of Care (Signed)
Immediate Anesthesia Transfer of Care Note  Patient: Kayla Gray  Procedure(s) Performed: Procedure(s): AORTIC VALVE REPLACEMENT (AVR) (N/A) TRANSESOPHAGEAL ECHOCARDIOGRAM (TEE) (N/A)  Patient Location: PACU  Anesthesia Type:General  Level of Consciousness: sedated and unresponsive  Airway & Oxygen Therapy: Patient remains intubated per anesthesia plan and Patient placed on Ventilator (see vital sign flow sheet for setting)  Post-op Assessment: Report given to RN and Post -op Vital signs reviewed and stable  Post vital signs: Reviewed and stable  Last Vitals:  Filed Vitals:   09/16/15 0556  BP: 153/77  Pulse: 73  Temp: 36.8 C  Resp: 16    Complications: No apparent anesthesia complications

## 2015-09-16 NOTE — Op Note (Signed)
CARDIOTHORACIC SURGERY OPERATIVE NOTE  Date of Procedure:  09/16/2015  Preoperative Diagnosis: Severe Aortic Stenosis   Postoperative Diagnosis: Same   Procedure:    Aortic Valve Replacement  Edwards Intuity Elite Pericardial Tissue Valve (size 23 mm, model # 5498YM, serial # [id number])   Surgeon: Valentina Gu. Roxy Manns, MD  Assistant: Nani Skillern, PA-C  Anesthesia: Lillia Abed, MD  Operative Findings:  Bicuspid aortic valve with severe aortic stenosis  Normal LV systolic function  Moderate LV hypertrophy          BRIEF CLINICAL NOTE AND INDICATIONS FOR SURGERY  Patient is a 79 year old female with history of aortic stenosis, SVT, previous stroke, obstructive sleep apnea, Hyperlipidemia, iron deficiency anemia, peptic ulcer disease, and degenerative arthritis who has been referred for surgical consultation to discuss treatment options for management of severe symptomatic aortic stenosis. Patient states that she has been told that she had a heart murmur for most of her adult life. She was first diagnosed with aortic stenosis in February 2013 when she developed symptomatic SVT while she was rehabilitating following total knee replacement surgery. She was started on diltiazem at that time and an echocardiogram was performed demonstrating the presence of aortic stenosis. Possible referral for surgical consultation was discussed, but the patient remained otherwise asymptomatic and was followed medically. In February 2015 the patient suffered a brief syncopal episode with symptoms of left-sided weakness felt consistent with minor stroke or TIA. She was treated with aspirin and Plavix and 30 day event monitor did not reveal arrhythmia. The patient recently developed symptoms of exertional shortness of breath. Follow-up transthoracic echocardiogram revealed significant progression in the severity of aortic stenosis with peak velocity across aortic valve measured 5.1 m/s  corresponding to mean transvalvular gradient estimated 54 mmHg. Left ventricular systolic function remained preserved with ejection fraction estimated 60-65%. The patient underwent diagnostic cardiac catheterization by Dr. [name] on 08/17/2015 confirming the presence of severe aortic stenosis. The patient did not have significant coronary artery disease. She was subsequently referred for surgical consultation.  The patient has been seen in consultation and counseled at length regarding the indications, risks and potential benefits of surgery.  All questions have been answered, and the patient provides full informed consent for the operation as described.     DETAILS OF THE OPERATIVE PROCEDURE  Preparation:  The patient is brought to the operating room on the above mentioned date and central monitoring was established by the anesthesia team including placement of Swan-Ganz catheter and radial arterial line. The patient is placed in the supine position on the operating table.  Intravenous antibiotics are administered. General endotracheal anesthesia is induced uneventfully. A Foley catheter is placed.  Baseline transesophageal echocardiogram was performed.  Findings were notable for the presence of a bicuspid aortic valve with severe aortic stenosis.  There was trace aortic insufficiency.  There was normal LV systolic function with moderate LV hypertrophy.  The patient's chest, abdomen, both groins, and both lower extremities are prepared and draped in a sterile manner. A time out procedure is performed.   Surgical Approach:  A median sternotomy incision was performed and the pericardium is opened. The ascending aorta is normal in appearance.    Extracorporeal Cardiopulmonary Bypass and Myocardial Protection:  The ascending aorta and the right atrium are cannulated for cardiopulmonary bypass.  Adequate heparinization is verified.   A retrograde cardioplegia cannula is placed through the right  atrium into the coronary sinus.  The operative field was continuously flooded with carbon dioxide gas.  The entire pre-bypass portion of the operation was notable for stable hemodynamics.  Cardiopulmonary bypass was begun and the surface of the heart is inspected.  A cardioplegia cannula is placed in the ascending aorta.  A temperature probe was placed in the interventricular septum.  The patient is allowed to cool passively to [facility] systemic temperature.  The aortic cross clamp is applied and cold blood cardioplegia is delivered initially in an antegrade fashion through the aortic root.   Supplemental cardioplegia is given retrograde through the coronary sinus catheter.  Iced saline slush is applied for topical hypothermia.  The initial cardioplegic arrest is rapid with early diastolic arrest.  Repeat doses of cardioplegia are administered intermittently throughout the entire cross clamp portion of the operation through the coronary sinus catheter in order to maintain completely flat electrocardiogram and septal myocardial temperature below 15C.  Myocardial protection was felt to be excellent.   Aortic Valve Replacement:  An oblique transverse aortotomy incision was performed.  The aortic valve was inspected and notable for a congenitally bicuspid aortic valve with severe aortic stenosis.  The coronary arteries were approximately 180 degrees opposed from each other and there were only 2 sinuses of Val Salva.  The aortic valve leaflets were excised sharply and the aortic annulus decalcified.  Decalcification was notably straightforward.  The aortic annulus was sized to accept a 23 mm prosthesis.  The aortic root and left ventricle were irrigated with copious cold saline solution.  Aortic valve replacement was performed using an [employer] rapid deployment pericardial tissue valve (size 23 mm, model #[id number], serial # [id number]).  The valve was rinsed in saline for manufacture recommendations. A  total of 3 individual guiding sutures are placed through the aortic annulus. Because the patient had a true bicuspid aortic valve, precise locations for the suture placement were chosen using the replica sizer to mimic the nadirs of a valve with 3 sinuses of Valsalva. The valve was attached to the delivery device and the 3 guiding sutures placed through the valve sewing cuff. The valve is lowered into place. Care is taken to insure that the valve is completely seated in the annulus. The valve stent is deployed by inflating the deployment balloon to 4.5 atm pressure and holding for 10 seconds. The balloon is deflated the valve holder sutures cut In the deployment system removed. Each of the 3 guide sutures are secured using Cor knot clips.  The valve is carefully inspected to make sure that it is seated appropriately. Visualization through the valve is utilized to inspect the left ventricular outflow tract. Aortic root was filled with saline to make sure the valve is competent. Rewarming is begun.   Procedure Completion:  The aortotomy was closed using a 2-layer closure of running 4-0 Prolene suture.  One final dose of warm retrograde "hot shot" cardioplegia was administered retrograde through the coronary sinus catheter while all air was evacuated through the aortic root.  The aortic cross clamp was removed after a total cross clamp time of 56 minutes.  Epicardial pacing wires are fixed to the right ventricular outflow tract and to the right atrial appendage. The patient is rewarmed to 37C temperature. The aortic and left ventricular vents are removed.  The patient is weaned and disconnected from cardiopulmonary bypass.  The patient's rhythm at separation from bypass was sinus.  The patient was weaned from cardioplegic bypass without any inotropic support. Total cardiopulmonary bypass time for the operation was 80 minutes.  Followup transesophageal echocardiogram performed after  separation from bypass  revealed a well-seated aortic valve prosthesis that was functioning normally and without any sign of perivalvular leak.  Care is taken to examine the valve thoroughly from multiple different views including the deep transgastric view from several different angles.  Left ventricular function was unchanged from preoperatively.  The aortic and venous cannula were removed uneventfully. Protamine was administered to reverse the anticoagulation. The mediastinum and pleural space were inspected for hemostasis and irrigated with saline solution. The mediastinum and the right pleural space were drained using 3 chest tubes placed through separate stab incisions inferiorly.  The soft tissues anterior to the aorta were reapproximated loosely. The sternum is closed with double strength sternal wire. The soft tissues anterior to the sternum were closed in multiple layers and the skin is closed with a running subcuticular skin closure.  The post-bypass portion of the operation was notable for stable rhythm and hemodynamics.  The patient received a total of 1 pack adult platelets due to coagulopathy and thrombocytopenia after separation from cardiopulmonary bypass and reversal of heparin with protamine.  The patient received 2 units packed red blood cells during the procedure due to anemia which was present preoperatively and exacerbated by acute blood loss and hemodilution during cardiopulmonary bypass.   Disposition:  There are no intraoperative complications. All sponge and instrument counts are verified correct at completion of the operation, but the needle count was incorrect.  A portable chest x-ray was performed in the room to verify no retained needle after completion of the procedure.  The patient tolerated the procedure well and is transported to the surgical intensive care in stable condition.     Valentina Gu. Roxy Manns MD 09/16/2015 12:16 PM

## 2015-09-16 NOTE — Brief Op Note (Addendum)
09/16/2015  11:04 AM  PATIENT:  Kayla Gray  79 y.o. female  PRE-OPERATIVE DIAGNOSIS:  Aortic Stenosis  POST-OPERATIVE DIAGNOSIS:  Aortic Stenosis  PROCEDURE:  TRANSESOPHAGEAL ECHOCARDIOGRAM (TEE), AORTIC VALVE REPLACEMENT (AVR) (using an Edwards Intuity Elite Aortic Valve, size 23 mm)  SURGEON:    Rexene Alberts, MD  ASSISTANTS:  Nani Skillern, PA-C  ANESTHESIA:   Lillia Abed, MD  CROSSCLAMP TIME:   74'  CARDIOPULMONARY BYPASS TIME: 34'  FINDINGS:  Bicuspid aortic valve with severe aortic stenosis  Normal LV systolic function  Moderate LV hypertrophy   Aortic Valve Etiology   Aortic Insufficiency:  Trivial/Trace  Aortic Valve Disease:  Yes.  Aortic Stenosis:  Yes. Smallest Aortic Valve Area: 0.59cm2; Highest Mean Gradient: 158mmHg.  Etiology (Choose at least one and up to  5 etiologies):  Bicuspid valve disease and Degenerative - Calcified  Aortic Valve  Procedure Performed:  Replacement: Yes.  Other Edwards Intuity Elite Aortic Valve. Implant Model Number:[id number], Size:23, Unique Device Identifier:[id number]  Repair/Reconstruction: No.   Aortic Annular Enlargement: No.    COMPLICATIONS: None  BASELINE WEIGHT: 76 kg  PATIENT DISPOSITION:   TO SICU IN STABLE CONDITION  Rexene Alberts, MD 09/16/2015 12:10 PM

## 2015-09-16 NOTE — Anesthesia Preprocedure Evaluation (Addendum)
Anesthesia Evaluation  Patient identified by MRN, date of birth, ID band Patient awake    Reviewed: Allergy & Precautions, NPO status , Patient's Chart, lab work & pertinent test results  Airway Mallampati: II  TM Distance: >3 FB Neck ROM: Full    Dental  (+) Teeth Intact, Dental Advisory Given   Pulmonary sleep apnea ,    Pulmonary exam normal breath sounds clear to auscultation       Cardiovascular + angina with exertion +CHF  Normal cardiovascular exam+ Valvular Problems/Murmurs AS  Rhythm:Regular Rate:Normal + Systolic murmurs    Neuro/Psych Depression CVA    GI/Hepatic GERD  Medicated and Controlled,  Endo/Other    Renal/GU      Musculoskeletal   Abdominal   Peds  Hematology   Anesthesia Other Findings   Reproductive/Obstetrics                            Anesthesia Physical Anesthesia Plan  ASA: III  Anesthesia Plan: General   Post-op Pain Management:    Induction: Intravenous  Airway Management Planned: Oral ETT  Additional Equipment: Arterial line, PA Cath, TEE, Ultrasound Guidance Line Placement and CVP  Intra-op Plan:   Post-operative Plan: Post-operative intubation/ventilation  Informed Consent: I have reviewed the patients History and Physical, chart, labs and discussed the procedure including the risks, benefits and alternatives for the proposed anesthesia with the patient or authorized representative who has indicated his/her understanding and acceptance.   Dental advisory given  Plan Discussed with: CRNA and Surgeon  Anesthesia Plan Comments:        Anesthesia Quick Evaluation

## 2015-09-16 NOTE — Procedures (Signed)
Extubation Procedure Note  Patient Details:   Name: Kayla Gray DOB: 1936/01/20 MRN: [id number]   Airway Documentation:     Evaluation  O2 sats: stable throughout Complications: No apparent complications Patient did tolerate procedure well. Bilateral Breath Sounds: Clear Suctioning: Airway Yes    NIF(-30) VC(766mL)  ABG: 7.34/42.2/76/23    [name] 09/16/2015, 10:56 PM

## 2015-09-16 NOTE — Progress Notes (Signed)
  Echocardiogram Echocardiogram Transesophageal has been performed.  Jennette Dubin 09/16/2015, 9:29 AM

## 2015-09-16 NOTE — OR Nursing (Signed)
11:35 - 45 minute call to SICU, 11:58 - 2nd call to SICU

## 2015-09-17 ENCOUNTER — Inpatient Hospital Stay (HOSPITAL_COMMUNITY): Payer: PPO

## 2015-09-17 LAB — BASIC METABOLIC PANEL
ANION GAP: 6 (ref 5–15)
BUN: 12 mg/dL (ref 6–20)
CALCIUM: 7.6 mg/dL — AB (ref 8.9–10.3)
CO2: 23 mmol/L (ref 22–32)
Chloride: 112 mmol/L — ABNORMAL HIGH (ref 101–111)
Creatinine, Ser: 0.7 mg/dL (ref 0.44–1.00)
Glucose, Bld: 120 mg/dL — ABNORMAL HIGH (ref 65–99)
Potassium: 3.6 mmol/L (ref 3.5–5.1)
Sodium: 141 mmol/L (ref 135–145)

## 2015-09-17 LAB — CBC
HEMATOCRIT: 30.6 % — AB (ref 36.0–46.0)
HEMATOCRIT: 28.4 % — AB (ref 36.0–46.0)
Hemoglobin: 9.9 g/dL — ABNORMAL LOW (ref 12.0–15.0)
Hemoglobin: 9.5 g/dL — ABNORMAL LOW (ref 12.0–15.0)
MCH: 28 pg (ref 26.0–34.0)
MCH: 29.1 pg (ref 26.0–34.0)
MCHC: 32.4 g/dL (ref 30.0–36.0)
MCHC: 33.5 g/dL (ref 30.0–36.0)
MCV: 86.4 fL (ref 78.0–100.0)
MCV: 86.9 fL (ref 78.0–100.0)
PLATELETS: 105 10*3/uL — AB (ref 150–400)
Platelets: 106 10*3/uL — ABNORMAL LOW (ref 150–400)
RBC: 3.54 MIL/uL — AB (ref 3.87–5.11)
RBC: 3.27 MIL/uL — ABNORMAL LOW (ref 3.87–5.11)
RDW: 14.2 % (ref 11.5–15.5)
RDW: 14.6 % (ref 11.5–15.5)
WBC: 8.7 10*3/uL (ref 4.0–10.5)
WBC: 9.6 10*3/uL (ref 4.0–10.5)

## 2015-09-17 LAB — POCT I-STAT, CHEM 8
BUN: 16 mg/dL (ref 6–20)
CREATININE: 1 mg/dL (ref 0.44–1.00)
Calcium, Ion: 1.17 mmol/L (ref 1.13–1.30)
Chloride: 107 mmol/L (ref 101–111)
GLUCOSE: 118 mg/dL — AB (ref 65–99)
HEMATOCRIT: 29 % — AB (ref 36.0–46.0)
HEMOGLOBIN: 9.9 g/dL — AB (ref 12.0–15.0)
POTASSIUM: 4.1 mmol/L (ref 3.5–5.1)
Sodium: 143 mmol/L (ref 135–145)
TCO2: 22 mmol/L (ref 0–100)

## 2015-09-17 LAB — GLUCOSE, CAPILLARY
GLUCOSE-CAPILLARY: 114 mg/dL — AB (ref 65–99)
GLUCOSE-CAPILLARY: 113 mg/dL — AB (ref 65–99)
GLUCOSE-CAPILLARY: 146 mg/dL — AB (ref 65–99)
GLUCOSE-CAPILLARY: 125 mg/dL — AB (ref 65–99)
GLUCOSE-CAPILLARY: 117 mg/dL — AB (ref 65–99)
Glucose-Capillary: 147 mg/dL — ABNORMAL HIGH (ref 65–99)
Glucose-Capillary: 154 mg/dL — ABNORMAL HIGH (ref 65–99)
Glucose-Capillary: 96 mg/dL (ref 65–99)
Glucose-Capillary: 112 mg/dL — ABNORMAL HIGH (ref 65–99)
Glucose-Capillary: 91 mg/dL (ref 65–99)
Glucose-Capillary: 99 mg/dL (ref 65–99)
Glucose-Capillary: 126 mg/dL — ABNORMAL HIGH (ref 65–99)
Glucose-Capillary: 132 mg/dL — ABNORMAL HIGH (ref 65–99)
Glucose-Capillary: 118 mg/dL — ABNORMAL HIGH (ref 65–99)

## 2015-09-17 LAB — POCT I-STAT 3, ART BLOOD GAS (G3+)
BICARBONATE: 25.4 meq/L — AB (ref 20.0–24.0)
O2 SAT: 95 %
PO2 ART: 80 mmHg (ref 80.0–100.0)
TCO2: 27 mmol/L (ref 0–100)
pCO2 arterial: 44.4 mmHg (ref 35.0–45.0)
pH, Arterial: 7.369 (ref 7.350–7.450)

## 2015-09-17 LAB — TYPE AND SCREEN
ABO/RH(D): O POS
ANTIBODY SCREEN: NEGATIVE
UNIT DIVISION: 0
Unit division: 0

## 2015-09-17 LAB — PREPARE PLATELET PHERESIS: UNIT DIVISION: 0

## 2015-09-17 LAB — CREATININE, SERUM
Creatinine, Ser: 1.07 mg/dL — ABNORMAL HIGH (ref 0.44–1.00)
GFR calc Af Amer: 57 mL/min — ABNORMAL LOW (ref >60)
GFR calc non Af Amer: 49 mL/min — ABNORMAL LOW (ref >60)

## 2015-09-17 LAB — MAGNESIUM
MAGNESIUM: 2.5 mg/dL — AB (ref 1.7–2.4)
Magnesium: 2.4 mg/dL (ref 1.7–2.4)

## 2015-09-17 MED ORDER — PANTOPRAZOLE SODIUM 40 MG PO TBEC: 40.0000 mg | DELAYED_RELEASE_TABLET | Freq: Every day | ORAL | Status: DC

## 2015-09-17 MED ORDER — PAROXETINE HCL 20 MG PO TABS
20.0000 mg | ORAL_TABLET | Freq: Every day | ORAL | Status: DC
  Administered 2015-09-17 – 2015-09-22 (×6): 20 mg via ORAL
  Filled 2015-09-17 (×6): qty 1

## 2015-09-17 MED ORDER — EZETIMIBE 10 MG PO TABS
10.0000 mg | ORAL_TABLET | Freq: Every day | ORAL | Status: DC
  Administered 2015-09-18 – 2015-09-22 (×5): 10 mg via ORAL
  Filled 2015-09-17 (×5): qty 1

## 2015-09-17 MED ORDER — MORPHINE SULFATE (PF) 2 MG/ML IV SOLN
1.0000 mg | INTRAVENOUS | Status: DC | PRN
  Administered 2015-09-17 – 2015-09-19 (×2): 1 mg via INTRAVENOUS
  Filled 2015-09-17 (×3): qty 1

## 2015-09-17 MED ORDER — CLOPIDOGREL BISULFATE 75 MG PO TABS
75.0000 mg | ORAL_TABLET | Freq: Every day | ORAL | Status: DC
  Administered 2015-09-18: 75 mg via ORAL
  Filled 2015-09-17: qty 1

## 2015-09-17 MED ORDER — ASPIRIN EC 81 MG PO TBEC: 81.0000 mg | DELAYED_RELEASE_TABLET | Freq: Every day | ORAL | Status: DC

## 2015-09-17 MED ORDER — CETYLPYRIDINIUM CHLORIDE 0.05 % MT LIQD
7.0000 mL | Freq: Two times a day (BID) | OROMUCOSAL | Status: DC
  Administered 2015-09-17 – 2015-09-22 (×10): 7 mL via OROMUCOSAL

## 2015-09-17 MED ORDER — METOPROLOL TARTRATE 25 MG PO TABS
25.0000 mg | ORAL_TABLET | Freq: Two times a day (BID) | ORAL | Status: DC
  Administered 2015-09-17 – 2015-09-22 (×8): 25 mg via ORAL
  Filled 2015-09-17 (×8): qty 1

## 2015-09-17 MED ORDER — INSULIN ASPART 100 UNIT/ML ~~LOC~~ SOLN
0.0000 [IU] | SUBCUTANEOUS | Status: DC
  Administered 2015-09-17 – 2015-09-21 (×8): 2 [IU] via SUBCUTANEOUS

## 2015-09-17 MED ORDER — PANTOPRAZOLE SODIUM 40 MG PO TBEC
40.0000 mg | DELAYED_RELEASE_TABLET | Freq: Every day | ORAL | Status: DC
  Administered 2015-09-18 – 2015-09-22 (×5): 40 mg via ORAL
  Filled 2015-09-17 (×5): qty 1

## 2015-09-17 MED ORDER — POTASSIUM CHLORIDE 10 MEQ/50ML IV SOLN
10.0000 meq | INTRAVENOUS | Status: AC | PRN
  Administered 2015-09-17 (×3): 10 meq via INTRAVENOUS
  Filled 2015-09-17 (×3): qty 50

## 2015-09-17 MED ORDER — FUROSEMIDE 10 MG/ML IJ SOLN
20.0000 mg | Freq: Once | INTRAMUSCULAR | Status: AC
  Administered 2015-09-17: 20 mg via INTRAVENOUS
  Filled 2015-09-17: qty 2

## 2015-09-17 MED ORDER — ASPIRIN EC 81 MG PO TBEC
81.0000 mg | DELAYED_RELEASE_TABLET | Freq: Every day | ORAL | Status: DC
  Administered 2015-09-18 – 2015-09-22 (×5): 81 mg via ORAL
  Filled 2015-09-17 (×5): qty 1

## 2015-09-17 NOTE — Progress Notes (Signed)
Pt stated that does wear CPAP at home and her sister was suppose to be bringing her NIV machine from home but hasn't yet.  pt refuse to use hospital provided CPAP machine. Pt is stable at this time no distress or complications noted.

## 2015-09-17 NOTE — Progress Notes (Signed)
      [address]       [place] [address]             [phone]        CARDIOTHORACIC SURGERY PROGRESS NOTE   R1 Day Post-Op Procedure(s) (LRB): AORTIC VALVE REPLACEMENT (AVR) (N/A) TRANSESOPHAGEAL ECHOCARDIOGRAM (TEE) (N/A)  Subjective: Looks good.  Denies pain or SOB.  Mild nausea.  Objective: Vital signs: BP Readings from Last 1 Encounters:  09/17/15 97/56   Pulse Readings from Last 1 Encounters:  09/17/15 81   Resp Readings from Last 1 Encounters:  09/16/15 0   Temp Readings from Last 1 Encounters:  09/17/15 100 F (37.8 C)     Hemodynamics: PAP: (20-31)/(8-20) 20/8 mmHg CO:  [2.2 L/min-5.6 L/min] 3.9 L/min CI:  [1.2 L/min/m2-3.2 L/min/m2] 2.2 L/min/m2  Physical Exam:  Rhythm:   sinus  Breath sounds: clear  Heart sounds:  RRR  Incisions:  Dressing dry, intact  Abdomen:  Soft, non-distended, non-tender  Extremities:  Warm, well-perfused  Chest tubes:  Low volume thin serosanguinous output, no air leak    Intake/Output from previous day: 12/02 0701 - 12/03 0700 In: 8037.3 [I.V.:5092.3; WS:[id number]; IV Piggyback:1500] Out: [id number] [Urine:3930; Blood:1529; Chest Tube:720] Intake/Output this shift: Total I/O In: 287.2 [I.V.:237.2; IV Piggyback:50] Out: 125 [Urine:125]  Lab Results:  CBC: Recent Labs  09/16/15 1844 09/16/15 1847 09/17/15 0305  WBC 9.3  --  8.7  HGB 10.9* 11.2* 9.9*  HCT 33.7* 33.0* 30.6*  PLT 118*  --  105*    BMET:  Recent Labs  09/16/15 1847 09/17/15 0305  NA 144 141  K 4.0 3.6  CL 109 112*  CO2  --  23  GLUCOSE 134* 120*  BUN 12 12  CREATININE 0.60 0.70  CALCIUM  --  7.6*     PT/INR:   Recent Labs  09/16/15 1250  LABPROT 19.3*  INR 1.62*    CBG (last 3)   Recent Labs  09/17/15 0258 09/17/15 0430 09/17/15 0746  GLUCAP 112* 126* 132*    ABG    Component Value Date/Time   PHART 7.369 09/17/2015 0400   PCO2ART 44.4 09/17/2015 0400   PO2ART 80.0 09/17/2015 0400   HCO3 25.4*  09/17/2015 0400   TCO2 27 09/17/2015 0400   ACIDBASEDEF 5.0* 09/16/2015 2344   O2SAT 95.0 09/17/2015 0400    CXR: PORTABLE CHEST 1 VIEW  COMPARISON: 09/16/2015  FINDINGS: Endotracheal and NG tubes removed. Chest tubes stable. Postoperative changes from aortic valve replacement are stable. Tubular device is otherwise stable. Lungs are less aerated with increasing basilar atelectasis. No pneumothorax. Vascular congestion is mild and stable.  IMPRESSION: Extubated.  Increasing basilar atelectasis.  Stable mild vascular congestion without pulmonary edema.   Electronically Signed  By: Marybelle Killings M.D.  On: 09/17/2015 09:23  Assessment/Plan: S/P Procedure(s) (LRB): AORTIC VALVE REPLACEMENT (AVR) (N/A) TRANSESOPHAGEAL ECHOCARDIOGRAM (TEE) (N/A)  Doing well POD1 Maintaining NSR w/ stable hemodynamics on low dose dopamine Expected post op acute blood loss anemia with chronic anemia pre op, Hgb 9.9 stable Chronic diastolic CHF with expected post-op volume excess, mild Expected post op atelectasis, mild History of SVT in past   Mobilize  D/C dopamine  Diuresis  Beta blocker  Leave chest tubes in for now   Rexene Alberts, MD 09/17/2015 9:26 AM

## 2015-09-17 NOTE — Progress Notes (Signed)
[facility] BRIEF SICU PROGRESS NOTE  1 Day Post-Op  S/P Procedure(s) (LRB): AORTIC VALVE REPLACEMENT (AVR) (N/A) TRANSESOPHAGEAL ECHOCARDIOGRAM (TEE) (N/A)   Looks good NSR w/ one brief run SVT BP stable UOP 30 mL/hr Labs okay  Plan: Will increase metoprolol.  Mobilize.  Lasix  Rexene Alberts, MD 09/17/2015 4:44 PM

## 2015-09-18 ENCOUNTER — Inpatient Hospital Stay (HOSPITAL_COMMUNITY): Payer: PPO

## 2015-09-18 LAB — BASIC METABOLIC PANEL
ANION GAP: 5 (ref 5–15)
BUN: 19 mg/dL (ref 6–20)
CO2: 24 mmol/L (ref 22–32)
Calcium: 7.9 mg/dL — ABNORMAL LOW (ref 8.9–10.3)
Chloride: 109 mmol/L (ref 101–111)
Creatinine, Ser: 1.09 mg/dL — ABNORMAL HIGH (ref 0.44–1.00)
GFR calc Af Amer: 55 mL/min — ABNORMAL LOW (ref >60)
GFR, EST NON AFRICAN AMERICAN: 48 mL/min — AB (ref >60)
GLUCOSE: 157 mg/dL — AB (ref 65–99)
POTASSIUM: 4 mmol/L (ref 3.5–5.1)
Sodium: 138 mmol/L (ref 135–145)

## 2015-09-18 LAB — GLUCOSE, CAPILLARY
GLUCOSE-CAPILLARY: 114 mg/dL — AB (ref 65–99)
GLUCOSE-CAPILLARY: 115 mg/dL — AB (ref 65–99)
GLUCOSE-CAPILLARY: 132 mg/dL — AB (ref 65–99)
GLUCOSE-CAPILLARY: 137 mg/dL — AB (ref 65–99)
Glucose-Capillary: 109 mg/dL — ABNORMAL HIGH (ref 65–99)
Glucose-Capillary: 103 mg/dL — ABNORMAL HIGH (ref 65–99)

## 2015-09-18 LAB — CBC
HEMATOCRIT: 27.9 % — AB (ref 36.0–46.0)
HEMOGLOBIN: 8.7 g/dL — AB (ref 12.0–15.0)
MCH: 27.8 pg (ref 26.0–34.0)
MCHC: 31.2 g/dL (ref 30.0–36.0)
MCV: 89.1 fL (ref 78.0–100.0)
PLATELETS: 84 10*3/uL — AB (ref 150–400)
RBC: 3.13 MIL/uL — ABNORMAL LOW (ref 3.87–5.11)
RDW: 14.9 % (ref 11.5–15.5)
WBC: 8.6 10*3/uL (ref 4.0–10.5)

## 2015-09-18 MED ORDER — WARFARIN SODIUM 2.5 MG PO TABS
2.5000 mg | ORAL_TABLET | Freq: Every day | ORAL | Status: DC
  Administered 2015-09-18 – 2015-09-21 (×4): 2.5 mg via ORAL
  Filled 2015-09-18 (×4): qty 1

## 2015-09-18 MED ORDER — MOVING RIGHT ALONG BOOK
Freq: Once | Status: AC
  Administered 2015-09-18: 12:00:00
  Filled 2015-09-18: qty 1

## 2015-09-18 MED ORDER — SODIUM CHLORIDE 0.9 % IV SOLN: 250.0000 mL | INTRAVENOUS | Status: DC | PRN

## 2015-09-18 MED ORDER — METOCLOPRAMIDE HCL 5 MG/ML IJ SOLN
10.0000 mg | Freq: Four times a day (QID) | INTRAMUSCULAR | Status: DC
  Administered 2015-09-18 – 2015-09-22 (×12): 10 mg via INTRAVENOUS
  Filled 2015-09-18 (×12): qty 2

## 2015-09-18 MED ORDER — ENOXAPARIN SODIUM 30 MG/0.3ML ~~LOC~~ SOLN
30.0000 mg | SUBCUTANEOUS | Status: DC
  Administered 2015-09-19 – 2015-09-22 (×4): 30 mg via SUBCUTANEOUS
  Filled 2015-09-18 (×4): qty 0.3

## 2015-09-18 MED ORDER — AMIODARONE HCL IN DEXTROSE 360-4.14 MG/200ML-% IV SOLN
INTRAVENOUS | Status: AC
  Filled 2015-09-18: qty 200

## 2015-09-18 MED ORDER — AMIODARONE HCL IN DEXTROSE 360-4.14 MG/200ML-% IV SOLN
60.0000 mg/h | INTRAVENOUS | Status: AC
  Administered 2015-09-18 (×2): 60 mg/h via INTRAVENOUS
  Filled 2015-09-18: qty 200

## 2015-09-18 MED ORDER — FUROSEMIDE 10 MG/ML IJ SOLN
20.0000 mg | Freq: Once | INTRAMUSCULAR | Status: AC
  Administered 2015-09-18: 20 mg via INTRAVENOUS
  Filled 2015-09-18: qty 2

## 2015-09-18 MED ORDER — AMIODARONE LOAD VIA INFUSION
150.0000 mg | Freq: Once | INTRAVENOUS | Status: AC
  Administered 2015-09-18: 150 mg via INTRAVENOUS
  Filled 2015-09-18: qty 83.34

## 2015-09-18 MED ORDER — AMIODARONE HCL IN DEXTROSE 360-4.14 MG/200ML-% IV SOLN
30.0000 mg/h | INTRAVENOUS | Status: AC
  Administered 2015-09-18 – 2015-09-20 (×5): 30 mg/h via INTRAVENOUS
  Filled 2015-09-18 (×5): qty 200

## 2015-09-18 MED ORDER — WARFARIN - PHYSICIAN DOSING INPATIENT
Freq: Every day | Status: DC
  Administered 2015-09-18: 18:00:00

## 2015-09-18 MED ORDER — SODIUM CHLORIDE 0.9 % IJ SOLN: 3.0000 mL | INTRAMUSCULAR | Status: DC | PRN

## 2015-09-18 MED ORDER — SODIUM CHLORIDE 0.9 % IJ SOLN
3.0000 mL | Freq: Two times a day (BID) | INTRAMUSCULAR | Status: DC
  Administered 2015-09-18 – 2015-09-19 (×4): 3 mL via INTRAVENOUS

## 2015-09-18 NOTE — Progress Notes (Signed)
Carb mod diet ordered for pt; attempted to assist pt with dinner; pt refusing to eat at this time; Zofran not due at this time; will cont. To monitor.  Kayla Gray

## 2015-09-18 NOTE — Progress Notes (Addendum)
[address]       [place] [address]             [phone]        CARDIOTHORACIC SURGERY PROGRESS NOTE   R2 Days Post-Op Procedure(s) (LRB): AORTIC VALVE REPLACEMENT (AVR) (N/A) TRANSESOPHAGEAL ECHOCARDIOGRAM (TEE) (N/A)  Subjective: Feels weak but otherwise okay.  Didn't get very far with attempts to ambulate.  Minimal pain.  Denies SOB.  Marginal appetite  Objective: Vital signs: BP Readings from Last 1 Encounters:  09/18/15 119/83   Pulse Readings from Last 1 Encounters:  09/18/15 70   Resp Readings from Last 1 Encounters:  09/18/15 14   Temp Readings from Last 1 Encounters:  09/18/15 98 F (36.7 C) Oral    Hemodynamics:    Physical Exam:  Rhythm:   Episode SVT last night then went into Afib - remains in rate-controlled Afib  Breath sounds: clear  Heart sounds:  irregular  Incisions:  Dressing dry, intact  Abdomen:  Soft, non-distended, non-tender  Extremities:  Warm, well-perfused  Chest tubes:  Low volume thin serosanguinous output, no air leak    Intake/Output from previous day: 12/03 0701 - 12/04 0700 In: 1068 [P.O.:120; I.V.:848; IV Piggyback:100] Out: 1100 [Urine:710; Chest Tube:390] Intake/Output this shift: Total I/O In: 820.2 [P.O.:120; I.V.:700.2] Out: 140 [Urine:90; Chest Tube:50]  Lab Results:  CBC: Recent Labs  09/17/15 1637 09/17/15 1641 09/18/15 0115  WBC 9.6  --  8.6  HGB 9.5* 9.9* 8.7*  HCT 28.4* 29.0* 27.9*  PLT 106*  --  84*    BMET:  Recent Labs  09/17/15 0305  09/17/15 1641 09/18/15 0115  NA 141  --  143 138  K 3.6  --  4.1 4.0  CL 112*  --  107 109  CO2 23  --   --  24  GLUCOSE 120*  --  118* 157*  BUN 12  --  16 19  CREATININE 0.70  < > 1.00 1.09*  CALCIUM 7.6*  --   --  7.9*  < > = values in this interval not displayed.   PT/INR:   Recent Labs  09/16/15 1250  LABPROT 19.3*  INR 1.62*    CBG (last 3)   Recent Labs  09/17/15 2037 09/17/15 2346 09/18/15 0446  GLUCAP  118* 114* 137*    ABG    Component Value Date/Time   PHART 7.369 09/17/2015 0400   PCO2ART 44.4 09/17/2015 0400   PO2ART 80.0 09/17/2015 0400   HCO3 25.4* 09/17/2015 0400   TCO2 22 09/17/2015 1641   ACIDBASEDEF 5.0* 09/16/2015 2344   O2SAT 95.0 09/17/2015 0400    CXR: PORTABLE CHEST 1 VIEW  COMPARISON: 09/17/2015 and 09/16/2015.  FINDINGS: 0506 hours. Interval removal of the Swan-Ganz catheter. Right IJ sheath remains in place. There is a right chest tube and mediastinal drains. No evidence of pneumothorax. There are persistent low lung volumes without significant change in bibasilar atelectasis and a probable small left pleural effusion.  IMPRESSION: No significant change in bibasilar atelectasis and probable left pleural effusion following partial support system withdrawal. No evidence of pneumothorax.   Electronically Signed  By: Richardean Sale M.D.  On: 09/18/2015 07:47  Assessment/Plan: S/P Procedure(s) (LRB): AORTIC VALVE REPLACEMENT (AVR) (N/A) TRANSESOPHAGEAL ECHOCARDIOGRAM (TEE) (N/A)  Overall stable POD2 Post op Afib, rate-controlled on IV amiodarone History of SVT in the past Expected post op acute blood loss anemia, Hgb 8.7 today Chronic diastolic CHF with expected post-op volume excess,  stable Expected post op atelectasis, mild Post op thrombocytopenia, platelet count down slightly to 84k today   Continue IV amiodarone for now  Start Coumadin and stop Plavix  Watch platelet count and anemia  D/C chest tubes and foley  Mobilize as much as possible  Rexene Alberts, MD 09/18/2015 11:16 AM

## 2015-09-18 NOTE — Progress Notes (Signed)
Pt extremely weak and fatigued; pt encouraged to get OOB again this evening; will cont. To encourage; pt with difficulty holding up in hand; just states she feels weak; VSS; will cont. To monitor.  Ruben Reason

## 2015-09-18 NOTE — Evaluation (Signed)
Physical Therapy Evaluation Patient Details Name: Kayla Gray MRN: [id number] DOB: Dec 14, 1935 Today's Date: 09/18/2015   History of Present Illness  79 year old female with history of aortic stenosis, SVT, previous stroke, obstructive sleep apnea, Hyperlipidemia, iron deficiency anemia, peptic ulcer disease, and degenerative arthritis (s/p R TKA 2013 and L TKA 2016). She underwent aortic valve replacement 09-16-15.  Clinical Impression  Pt admitted with above diagnosis. Pt currently with functional limitations due to the deficits listed below (see PT Problem List). On eval, pt required +2 mod assist for sit <> stand and ambulation 5 feet pushing w/c. Gait distance limited by pain and decreased BP. Pt with frequent history of syncope. BP 99/65 upon return to recliner. Pt will benefit from skilled PT to increase their independence and safety with mobility to allow discharge to the venue listed below.       Follow Up Recommendations SNF;Supervision/Assistance - 24 hour    Equipment Recommendations  None recommended by PT    Recommendations for Other Services OT consult     Precautions / Restrictions Precautions Precautions: Fall;Sternal Precaution Comments: Pt educated on sternal precautions. Restrictions Weight Bearing Restrictions: Yes (sternal precautions)      Mobility  Bed Mobility Overal bed mobility: +2 for physical assistance             General bed mobility comments: Pt received in recliner. Pt transfered bed to chair with nursing assist early AM.  Transfers Overall transfer level: Needs assistance Equipment used: Pushed w/c Transfers: Sit to/from Stand Sit to Stand: +2 physical assistance         General transfer comment: verbal cues for sternal precautions  Ambulation/Gait Ambulation/Gait assistance: +2 physical assistance;Mod assist Ambulation Distance (Feet): 5 Feet Assistive device:  (pushing w/c) Gait Pattern/deviations: Step-through  pattern;Decreased stride length Gait velocity: decreased   General Gait Details: verbal cues for posture and sequencing  Stairs            Wheelchair Mobility    Modified Rankin (Stroke Patients Only)       Balance                                             Pertinent Vitals/Pain Pain Assessment: Faces Faces Pain Scale: Hurts even more Pain Location: sx site with mobility Pain Descriptors / Indicators: Grimacing;Guarding Pain Intervention(s): Monitored during session;Limited activity within patient's tolerance    Home Living Family/patient expects to be discharged to:: Private residence Living Arrangements: Spouse/significant other Available Help at Discharge: Family;Available 24 hours/day Type of Home: House Home Access: Stairs to enter Entrance Stairs-Rails: Psychiatric nurse of Steps: 2 Home Layout: Two level;Able to live on main level with bedroom/bathroom Home Equipment: Gilford Rile - 2 wheels;Cane - single point;Bedside commode Additional Comments: Pt has DME from previous TKA.    Prior Function Level of Independence: Independent               Hand Dominance        Extremity/Trunk Assessment   Upper Extremity Assessment: Defer to OT evaluation           Lower Extremity Assessment: Generalized weakness      Cervical / Trunk Assessment: Normal  Communication   Communication: No difficulties  Cognition Arousal/Alertness: Awake/alert Behavior During Therapy: Flat affect Overall Cognitive Status: Within Functional Limits for tasks assessed (appropriate but slow to respond)  General Comments      Exercises        Assessment/Plan    PT Assessment Patient needs continued PT services  PT Diagnosis Difficulty walking;Acute pain;Generalized weakness   PT Problem List Decreased strength;Decreased activity tolerance;Decreased balance;Decreased mobility;Cardiopulmonary status  limiting activity;Decreased knowledge of precautions;Pain;Decreased safety awareness  PT Treatment Interventions DME instruction;Gait training;Stair training;Functional mobility training;Therapeutic activities;Therapeutic exercise;Patient/family education;Balance training   PT Goals (Current goals can be found in the Care Plan section) Acute Rehab PT Goals Patient Stated Goal: not stated PT Goal Formulation: With patient Time For Goal Achievement: 10/02/15 Potential to Achieve Goals: Good    Frequency Min 3X/week (possible d/c home instead of SNF with progress)   Barriers to discharge        Co-evaluation               End of Session Equipment Utilized During Treatment: Gait belt;Oxygen Activity Tolerance: Patient limited by fatigue;Patient limited by pain Patient left: in chair;with call bell/phone within reach;with nursing/sitter in room Nurse Communication: Mobility status         Time: [id number] PT Time Calculation (min) (ACUTE ONLY): 24 min   Charges:   PT Evaluation $Initial PT Evaluation Tier I: 1 Procedure PT Treatments $Gait Training: 8-22 mins   PT G Codes:        Lorriane Shire 09/18/2015, 8:55 AM

## 2015-09-19 ENCOUNTER — Inpatient Hospital Stay (HOSPITAL_COMMUNITY): Payer: PPO

## 2015-09-19 ENCOUNTER — Encounter (HOSPITAL_COMMUNITY): Payer: Self-pay | Admitting: Thoracic Surgery (Cardiothoracic Vascular Surgery)

## 2015-09-19 LAB — CBC
HEMATOCRIT: 26.9 % — AB (ref 36.0–46.0)
HEMOGLOBIN: 8.7 g/dL — AB (ref 12.0–15.0)
MCH: 28.4 pg (ref 26.0–34.0)
MCHC: 32.3 g/dL (ref 30.0–36.0)
MCV: 87.9 fL (ref 78.0–100.0)
Platelets: 88 10*3/uL — ABNORMAL LOW (ref 150–400)
RBC: 3.06 MIL/uL — AB (ref 3.87–5.11)
RDW: 14.9 % (ref 11.5–15.5)
WBC: 9.7 10*3/uL (ref 4.0–10.5)

## 2015-09-19 LAB — BASIC METABOLIC PANEL
Anion gap: 4 — ABNORMAL LOW (ref 5–15)
BUN: 22 mg/dL — AB (ref 6–20)
CHLORIDE: 107 mmol/L (ref 101–111)
CO2: 28 mmol/L (ref 22–32)
Calcium: 8.1 mg/dL — ABNORMAL LOW (ref 8.9–10.3)
Creatinine, Ser: 1 mg/dL (ref 0.44–1.00)
GFR calc Af Amer: >60 mL/min (ref >60)
GFR calc non Af Amer: 53 mL/min — ABNORMAL LOW (ref >60)
Glucose, Bld: 108 mg/dL — ABNORMAL HIGH (ref 65–99)
POTASSIUM: 3.8 mmol/L (ref 3.5–5.1)
SODIUM: 139 mmol/L (ref 135–145)

## 2015-09-19 LAB — GLUCOSE, CAPILLARY
GLUCOSE-CAPILLARY: 141 mg/dL — AB (ref 65–99)
GLUCOSE-CAPILLARY: 81 mg/dL (ref 65–99)
Glucose-Capillary: 115 mg/dL — ABNORMAL HIGH (ref 65–99)
Glucose-Capillary: 125 mg/dL — ABNORMAL HIGH (ref 65–99)
Glucose-Capillary: 17 mg/dL — CL (ref 65–99)
Glucose-Capillary: 97 mg/dL (ref 65–99)
Glucose-Capillary: 99 mg/dL (ref 65–99)

## 2015-09-19 LAB — PROTIME-INR
INR: 1.26 (ref 0.00–1.49)
PROTHROMBIN TIME: 15.9 s — AB (ref 11.6–15.2)

## 2015-09-19 MED ORDER — FUROSEMIDE 40 MG PO TABS
40.0000 mg | ORAL_TABLET | Freq: Every day | ORAL | Status: DC
  Administered 2015-09-19 – 2015-09-22 (×4): 40 mg via ORAL
  Filled 2015-09-19 (×4): qty 1

## 2015-09-19 MED ORDER — SODIUM CHLORIDE 0.9 % IJ SOLN
10.0000 mL | INTRAMUSCULAR | Status: DC | PRN
Start: 2015-09-19 — End: 2015-09-22
  Administered 2015-09-20 – 2015-09-22 (×4): 10 mL
  Filled 2015-09-19 (×4): qty 40

## 2015-09-19 MED ORDER — BOOST / RESOURCE BREEZE PO LIQD
1.0000 | Freq: Three times a day (TID) | ORAL | Status: DC
Start: 2015-09-19 — End: 2015-09-22
  Administered 2015-09-19 – 2015-09-20 (×3): 1 via ORAL

## 2015-09-19 MED FILL — Mannitol IV Soln 20%: INTRAVENOUS | Qty: 500 | Status: AC

## 2015-09-19 MED FILL — Sodium Chloride IV Soln 0.9%: INTRAVENOUS | Qty: 1000 | Status: AC

## 2015-09-19 MED FILL — Electrolyte-R (PH 7.4) Solution: INTRAVENOUS | Qty: 4000 | Status: AC

## 2015-09-19 MED FILL — Sodium Bicarbonate IV Soln 8.4%: INTRAVENOUS | Qty: 50 | Status: AC

## 2015-09-19 MED FILL — Lidocaine HCl IV Inj 20 MG/ML: INTRAVENOUS | Qty: 5 | Status: AC

## 2015-09-19 MED FILL — Heparin Sodium (Porcine) Inj 1000 Unit/ML: INTRAMUSCULAR | Qty: 2500 | Status: AC

## 2015-09-19 MED FILL — Heparin Sodium (Porcine) Inj 1000 Unit/ML: INTRAMUSCULAR | Qty: 10 | Status: AC

## 2015-09-19 NOTE — Progress Notes (Signed)
Pt placed on cpap at this time with hospital machine and PTS HOME TUBING and NASAL PILLOWS. Will continue to monitor.

## 2015-09-19 NOTE — Care Management Important Message (Signed)
Important Message  Patient Details  Name: Kayla Gray MRN: [id number] Date of Birth: [birth year]   Medicare Important Message Given:  Yes    Makinzy Cleere Abena 09/19/2015, 9:28 AM

## 2015-09-19 NOTE — Progress Notes (Addendum)
[address]       [place] [address]             [phone]        CARDIOTHORACIC SURGERY PROGRESS NOTE   R3 Days Post-Op Procedure(s) (LRB): AORTIC VALVE REPLACEMENT (AVR) (N/A) TRANSESOPHAGEAL ECHOCARDIOGRAM (TEE) (N/A)  Subjective: Feels weak.  No appetite.  Denies pain.  No specific complaints.  Objective: Vital signs: BP Readings from Last 1 Encounters:  09/19/15 105/65   Pulse Readings from Last 1 Encounters:  09/19/15 66   Resp Readings from Last 1 Encounters:  09/19/15 12   Temp Readings from Last 1 Encounters:  09/19/15 98 F (36.7 C) Oral    Hemodynamics:    Physical Exam:  Rhythm:   sinus  Breath sounds: Diminished at bases  Heart sounds:  RRR  Incisions:  Clean and dry  Abdomen:  Soft, non-distended, non-tender  Extremities:  Warm, well-perfused    Intake/Output from previous day: 12/04 0701 - 12/05 0700 In: 1564.1 [P.O.:240; I.V.:1324.1] Out: 670 [Urine:620; Chest Tube:50] Intake/Output this shift: Total I/O In: 36.7 [I.V.:36.7] Out: -   Lab Results:  CBC: Recent Labs  09/18/15 0115 09/19/15 0342  WBC 8.6 9.7  HGB 8.7* 8.7*  HCT 27.9* 26.9*  PLT 84* 88*    BMET:  Recent Labs  09/18/15 0115 09/19/15 0342  NA 138 139  K 4.0 3.8  CL 109 107  CO2 24 28  GLUCOSE 157* 108*  BUN 19 22*  CREATININE 1.09* 1.00  CALCIUM 7.9* 8.1*     PT/INR:   Recent Labs  09/19/15 0342  LABPROT 15.9*  INR 1.26    CBG (last 3)   Recent Labs  09/19/15 09/19/15 0320 09/19/15 0719  GLUCAP 125* 115* 97    ABG    Component Value Date/Time   PHART 7.369 09/17/2015 0400   PCO2ART 44.4 09/17/2015 0400   PO2ART 80.0 09/17/2015 0400   HCO3 25.4* 09/17/2015 0400   TCO2 22 09/17/2015 1641   ACIDBASEDEF 5.0* 09/16/2015 2344   O2SAT 95.0 09/17/2015 0400    CXR: PORTABLE CHEST 1 VIEW  COMPARISON: September 18, 2015  FINDINGS: Central catheter tip is in the superior vena cava. Chest tubes and mediastinal  drain have been removed. No pneumothorax. There is airspace consolidation in the left base medially. There is patchy infiltrate in the medial right base with mild atelectatic change. There is a small left pleural effusion. The lungs elsewhere are clear. Heart is upper normal in size with pulmonary vascularity normal. There is a prosthetic aortic valve. No adenopathy appreciable.  IMPRESSION: Persistent infiltrate in the lung bases, somewhat more on the left than on the right. Small left pleural effusion. No change in cardiac silhouette. No pneumothorax.   Electronically Signed  By: Lowella Grip III M.D.  On: 09/19/2015 07:14   Assessment/Plan: S/P Procedure(s) (LRB): AORTIC VALVE REPLACEMENT (AVR) (N/A) TRANSESOPHAGEAL ECHOCARDIOGRAM (TEE) (N/A)  Overall stable POD3 Post op SVT and Afib, currently maintaining NSR on IV amiodarone History of SVT in the past Expected post op acute blood loss anemia, Hgb stable 8.7 today Chronic diastolic CHF with expected post-op volume excess, stable Expected post op atelectasis, mild Post op thrombocytopenia, platelet count stable 88k today OSA on CPAP at home   Continue IV amiodarone for now - insert PICC line  ASA and coumadin  Lovenox for DVT prophylaxis until INR rises  Watch platelet count and anemia  Mobilize as much as possible  Nutritional supplements  Transfer step down  Rexene Alberts, MD 09/19/2015 8:43 AM

## 2015-09-19 NOTE — Progress Notes (Signed)
Central line/cordis sleeve dc'd as ordered. Per protocol pressure held and dressing applied will continue to monitor patient. Kennon Encinas, Bettina Gavia RN

## 2015-09-19 NOTE — Care Management Note (Signed)
Case Management Note  Patient Details  Name: ROZLIN TREZISE MRN: [id number] Date of Birth: Jan 17, 1936  Subjective/Objective:   Patient sitting up in chair.  Staff states very weak, slow to progress.  Patient prior to surgery lived at home with husband.  States husband will not be able to assist on discharge.  She states no other family, friends available to assist.   When asked what her plan on discharge is, states she would like to go to [facility] for rehab prior to going  Home.  PT recommending SNF also.  SW consult placed.                   Action/Plan:   Expected Discharge Date:                  Expected Discharge Plan:  Skilled Nursing Facility  In-House Referral:  Clinical Social Work  Discharge planning Services  CM Consult  Post Acute Care Choice:    Choice offered to:     DME Arranged:    DME Agency:     HH Arranged:    [facility] Agency:     Status of Service:  In process, will continue to follow  Medicare Important Message Given:  Yes Date Medicare IM Given:    Medicare IM give by:    Date Additional Medicare IM Given:    Additional Medicare Important Message give by:     If discussed at [place] of Stay Meetings, dates discussed:    Additional Comments:  Vergie Living, RN 09/19/2015, 1:03 PM

## 2015-09-19 NOTE — Progress Notes (Signed)
Initial Nutrition Assessment  DOCUMENTATION CODES:   Not applicable  INTERVENTION:   Boost Breeze po TID, each supplement provides 250 kcal and 9 grams of protein  NUTRITION DIAGNOSIS:   Increased nutrient needs related to  (post op healing) as evidenced by estimated needs  GOAL:   Patient will meet greater than or equal to 90% of their needs  MONITOR:   PO intake, Supplement acceptance, Labs, Weight trends, I & O's  REASON FOR ASSESSMENT:   Consult Assessment of nutrition requirement/status  ASSESSMENT:   79 yo Female with PMH of CHF, stroke, osteopenia, breast cancer; admitted with severe aortic stenosis.   Patient s/p procedure AORTIC VALVE REPLACEMENT (AVR)  Patient reports a decreased appetite.  Ate some oatmeal for breakfast this AM.  Does not like Ensure Enlive (on tray table upon visit) -- reports "it makes my stomach hurt".  RD offered clear liquid supplement -- pt amenable to trying.  RD to order.  Nutrition focused physical exam completed.  No muscle or subcutaneous fat depletion noticed.  Diet Order:  Diet Carb Modified Fluid consistency:: Thin; Room service appropriate?: Yes  Skin:  Reviewed, no issues  Last BM:  N/A  Height:   Ht Readings from Last 1 Encounters:  09/16/15 5\' 2"  (1.575 m)    Weight:   Wt Readings from Last 1 Encounters:  09/19/15 171 lb 15.3 oz (78 kg)    CBG (last 3)   Recent Labs  09/19/15 0320 09/19/15 0719 09/19/15 1125  GLUCAP 115* 97 141*    Ideal Body Weight:  50 kg  BMI:  Body mass index is 31.44 kg/(m^2).  Estimated Nutritional Needs:   Kcal:  1800-2000  Protein:  100-110 gm  Fluid:  1.8-2.0 L  EDUCATION NEEDS:   No education needs identified at this time  Arthur Holms, RD, LDN Pager #: [phone] After-Hours Pager #: [phone]

## 2015-09-19 NOTE — NC FL2 (Signed)
[place] LEVEL OF CARE SCREENING TOOL     IDENTIFICATION  Patient Name: Kayla Gray Birthdate: 03-10-1936 Sex: female Admission Date (Current Location): 09/16/2015  [facility] and [place] Number: Engineering geologist and Address:  [facility] [place], [place] [address] [place], [place] [address]      Provider Number: [id number]  Attending Physician Name and Address:  Rexene Alberts, MD  Relative Name and Phone Number:       Current Level of Care: Hospital Recommended Level of Care: [place] Prior Approval Number:    Date Approved/Denied:   PASRR Number: [id number]  Discharge Plan: SNF    Current Diagnoses: Patient Active Problem List   Diagnosis Date Noted  . S/P aortic valve replacement with bioprosthetic valve 09/16/2015  . Chronic diastolic (congestive) heart failure ([place])   . SOB (shortness of breath) 08/17/2015  . Angina pectoris ([place]) 08/17/2015  . Severe aortic valve stenosis 08/12/2015  . Low back pain 08/12/2015  . Sinus tachycardia ([place]) 01/06/2014  . Osteoarthritis of both knees 01/06/2014  . CVA (cerebral vascular accident) ([place]) 11/25/2013  . Aortic valve stenosis 11/27/2012  . SVT (supraventricular tachycardia) ([place]) 11/27/2012  . Syncope 11/27/2012    Orientation ACTIVITIES/SOCIAL BLADDER RESPIRATION    Self, Time, Situation, Place    Incontinent O2 (As needed) (2L [place])  BEHAVIORAL SYMPTOMS/MOOD NEUROLOGICAL BOWEL NUTRITION STATUS      Continent Diet (carb modified)  PHYSICIAN VISITS COMMUNICATION OF NEEDS Height & Weight Skin    Verbally 5\' 2"  (157.5 cm) 171 lbs. Surgical wounds          AMBULATORY STATUS RESPIRATION    Assist extensive O2 (As needed) (2L [place])      Personal Care Assistance Level of Assistance  Bathing, Dressing Bathing Assistance: Maximum assistance   Dressing Assistance: Maximum assistance      Functional Limitations Info                SPECIAL CARE FACTORS  FREQUENCY  PT (By licensed PT)     PT Frequency: 5/wk             Additional Factors Info  Code Status, Allergies, Insulin Sliding Scale Code Status Info: FULL Allergies Info: codeine, evista, statins, sulfa antibiotics   Insulin Sliding Scale Info: 6/day       Current Medications (09/19/2015):  This is the current hospital active medication list Current Facility-Administered Medications  Medication Dose Route Frequency Provider Last Rate Last Dose  . 0.9 %  sodium chloride infusion  250 mL Intravenous Continuous Rexene Alberts, MD 20 mL/hr at 09/18/15 2000 250 mL at 09/18/15 2000  . 0.9 %  sodium chloride infusion  250 mL Intravenous PRN Rexene Alberts, MD      . acetaminophen (TYLENOL) tablet 1,000 mg  1,000 mg Oral 4 times per day Rexene Alberts, MD   1,000 mg at 09/19/15 1710  . amiodarone (NEXTERONE PREMIX) 360 MG/200ML (1.8 mg/mL) IV infusion  30 mg/hr Intravenous Continuous Rexene Alberts, MD 16.7 mL/hr at 09/19/15 0930 30 mg/hr at 09/19/15 0930  . antiseptic oral rinse (CPC / CETYLPYRIDINIUM CHLORIDE 0.05%) solution 7 mL  7 mL Mouth Rinse BID Rexene Alberts, MD   7 mL at 09/19/15 1000  . aspirin EC tablet 81 mg  81 mg Oral Daily Rexene Alberts, MD   81 mg at 09/19/15 0930  . bisacodyl (DULCOLAX) EC tablet 10 mg  10 mg Oral Daily Braulio Conte  Keturah Barre, MD   10 mg at 09/19/15 0930   Or  . bisacodyl (DULCOLAX) suppository 10 mg  10 mg Rectal Daily Rexene Alberts, MD      . docusate sodium (COLACE) capsule 200 mg  200 mg Oral Daily Rexene Alberts, MD   200 mg at 09/19/15 0930  . enoxaparin (LOVENOX) injection 30 mg  30 mg Subcutaneous Q24H Rexene Alberts, MD   30 mg at 09/19/15 [id number]  . ezetimibe (ZETIA) tablet 10 mg  10 mg Oral Daily Rexene Alberts, MD   10 mg at 09/19/15 0930  . feeding supplement (BOOST / RESOURCE BREEZE) liquid 1 Container  1 Container Oral TID WC Rogue Bussing, RD   1 Container at 09/19/15 1800  . furosemide (LASIX) tablet 40 mg  40 mg Oral Daily  Rexene Alberts, MD   40 mg at 09/19/15 0930  . insulin aspart (novoLOG) injection 0-24 Units  0-24 Units Subcutaneous 6 times per day Rexene Alberts, MD   2 Units at 09/19/15 1207  . metoCLOPramide (REGLAN) injection 10 mg  10 mg Intravenous 4 times per day Rexene Alberts, MD   10 mg at 09/19/15 1710  . metoprolol (LOPRESSOR) injection 2.5-5 mg  2.5-5 mg Intravenous Q2H PRN Rexene Alberts, MD   5 mg at 09/18/15 1648  . metoprolol tartrate (LOPRESSOR) tablet 25 mg  25 mg Oral BID Rexene Alberts, MD   25 mg at 09/18/15 2232  . ondansetron (ZOFRAN) injection 4 mg  4 mg Intravenous Q6H PRN Rexene Alberts, MD   4 mg at 09/18/15 1339  . pantoprazole (PROTONIX) EC tablet 40 mg  40 mg Oral Daily Rexene Alberts, MD   40 mg at 09/19/15 0930  . PARoxetine (PAXIL) tablet 20 mg  20 mg Oral Daily Rexene Alberts, MD   20 mg at 09/19/15 0930  . sodium chloride 0.9 % injection 10-40 mL  10-40 mL Intracatheter PRN Rexene Alberts, MD      . sodium chloride 0.9 % injection 3 mL  3 mL Intravenous Q12H Rexene Alberts, MD   3 mL at 09/18/15 2233  . sodium chloride 0.9 % injection 3 mL  3 mL Intravenous PRN Rexene Alberts, MD      . sodium chloride 0.9 % injection 3 mL  3 mL Intravenous Q12H Rexene Alberts, MD   3 mL at 09/19/15 1000  . sodium chloride 0.9 % injection 3 mL  3 mL Intravenous PRN Rexene Alberts, MD      . traMADol Veatrice Bourbon) tablet 50-100 mg  50-100 mg Oral Q4H PRN Rexene Alberts, MD      . warfarin (COUMADIN) tablet 2.5 mg  2.5 mg Oral q1800 Rexene Alberts, MD   2.5 mg at 09/19/15 1710  . Warfarin - Physician Dosing Inpatient   Does not apply [id number] Rexene Alberts, MD         Discharge Medications: Please see discharge summary for a list of discharge medications.  Relevant Imaging Results:  Relevant Lab Results:  Recent Labs    Additional Information SS#: [id number]  Cranford Mon, LCSW

## 2015-09-19 NOTE — Progress Notes (Signed)
Peripherally Inserted Central Catheter/Midline Placement  The IV Nurse has discussed with the patient and/or persons authorized to consent for the patient, the purpose of this procedure and the potential benefits and risks involved with this procedure.  The benefits include less needle sticks, lab draws from the catheter and patient may be discharged home with the catheter.  Risks include, but not limited to, infection, bleeding, blood clot (thrombus formation), and puncture of an artery; nerve damage and irregular heat beat.  Alternatives to this procedure were also discussed.  PICC/Midline Placement Documentation        Mico Spark, Nicolette Bang 09/19/2015, 6:21 PM

## 2015-09-20 ENCOUNTER — Inpatient Hospital Stay (HOSPITAL_COMMUNITY): Payer: PPO

## 2015-09-20 LAB — GLUCOSE, CAPILLARY
GLUCOSE-CAPILLARY: 88 mg/dL (ref 65–99)
GLUCOSE-CAPILLARY: 115 mg/dL — AB (ref 65–99)
Glucose-Capillary: 109 mg/dL — ABNORMAL HIGH (ref 65–99)
Glucose-Capillary: 125 mg/dL — ABNORMAL HIGH (ref 65–99)
Glucose-Capillary: 87 mg/dL (ref 65–99)

## 2015-09-20 LAB — BASIC METABOLIC PANEL
Anion gap: 6 (ref 5–15)
BUN: 17 mg/dL (ref 6–20)
CALCIUM: 8.1 mg/dL — AB (ref 8.9–10.3)
CHLORIDE: 106 mmol/L (ref 101–111)
CO2: 27 mmol/L (ref 22–32)
Creatinine, Ser: 0.86 mg/dL (ref 0.44–1.00)
GFR calc Af Amer: >60 mL/min (ref >60)
GFR calc non Af Amer: >60 mL/min (ref >60)
Glucose, Bld: 107 mg/dL — ABNORMAL HIGH (ref 65–99)
Potassium: 3.5 mmol/L (ref 3.5–5.1)
SODIUM: 139 mmol/L (ref 135–145)

## 2015-09-20 LAB — CBC
HCT: 26.2 % — ABNORMAL LOW (ref 36.0–46.0)
Hemoglobin: 8.4 g/dL — ABNORMAL LOW (ref 12.0–15.0)
MCH: 28.4 pg (ref 26.0–34.0)
MCHC: 32.1 g/dL (ref 30.0–36.0)
MCV: 88.5 fL (ref 78.0–100.0)
PLATELETS: 105 10*3/uL — AB (ref 150–400)
RBC: 2.96 MIL/uL — ABNORMAL LOW (ref 3.87–5.11)
RDW: 14.7 % (ref 11.5–15.5)
WBC: 6.4 10*3/uL (ref 4.0–10.5)

## 2015-09-20 LAB — PROTIME-INR
INR: 1.29 (ref 0.00–1.49)
PROTHROMBIN TIME: 16.3 s — AB (ref 11.6–15.2)

## 2015-09-20 MED ORDER — WARFARIN VIDEO
Freq: Once | Status: DC
Start: 2015-09-20 — End: 2015-09-22

## 2015-09-20 MED ORDER — MAGNESIUM HYDROXIDE 400 MG/5ML PO SUSP: 15.0000 mL | Freq: Every day | ORAL | Status: DC | PRN

## 2015-09-20 MED ORDER — AMIODARONE HCL 200 MG PO TABS
200.0000 mg | ORAL_TABLET | Freq: Two times a day (BID) | ORAL | Status: DC
  Administered 2015-09-20 – 2015-09-22 (×5): 200 mg via ORAL
  Filled 2015-09-20 (×5): qty 1

## 2015-09-20 MED ORDER — COUMADIN BOOK
Freq: Once | Status: DC
  Filled 2015-09-20: qty 1

## 2015-09-20 MED ORDER — LACTULOSE 10 GM/15ML PO SOLN: 10.0000 g | Freq: Every day | ORAL | Status: DC | PRN

## 2015-09-20 NOTE — Progress Notes (Addendum)
       [address]       [place] [address]             [phone]          4 Days Post-Op Procedure(s) (LRB): AORTIC VALVE REPLACEMENT (AVR) (N/A) TRANSESOPHAGEAL ECHOCARDIOGRAM (TEE) (N/A)  Subjective: "I just feel so weak." Appetite poor, no BM yet. Breathing stable.   Objective: Vital signs in last 24 hours: Patient Vitals for the past 24 hrs:  BP Temp Temp src Pulse Resp SpO2 Weight  09/20/15 0437 114/63 mmHg 98.6 F (37 C) Oral 77 20 93 % 179 lb 10.8 oz (81.5 kg)  09/19/15 2023 133/70 mmHg 99.7 F (37.6 C) Oral 78 19 98 % -  09/19/15 1526 135/77 mmHg - - 72 18 99 % -  09/19/15 1500 - - - 85 (!) 23 94 % -  09/19/15 1400 113/60 mmHg - - 77 16 97 % -  09/19/15 1300 115/67 mmHg - - 79 (!) 23 100 % -  09/19/15 1200 109/72 mmHg - - 71 15 99 % -  09/19/15 1126 - 98.4 F (36.9 C) Oral - - - -  09/19/15 1100 113/63 mmHg - - 71 15 98 % -  09/19/15 1000 (!) 109/58 mmHg - - 76 19 99 % -  09/19/15 0900 102/61 mmHg - - 69 17 99 % -   Current Weight  09/20/15 179 lb 10.8 oz (81.5 kg)  BASELINE WEIGHT:76 kg   Intake/Output from previous day: 12/05 0701 - 12/06 0700 In: 964 [P.O.:340; I.V.:624] Out: 1100 [Urine:1100]  CBGs 81-99-109-87-107   PHYSICAL EXAM:  Heart: RRR Lungs: Diminished BS in bases Wound: Clean and dry Extremities: No significant LE edema    Lab Results: CBC: Recent Labs  09/19/15 0342 09/20/15 0510  WBC 9.7 6.4  HGB 8.7* 8.4*  HCT 26.9* 26.2*  PLT 88* 105*   BMET:  Recent Labs  09/19/15 0342 09/20/15 0510  NA 139 139  K 3.8 3.5  CL 107 106  CO2 28 27  GLUCOSE 108* 107*  BUN 22* 17  CREATININE 1.00 0.86  CALCIUM 8.1* 8.1*    PT/INR:  Recent Labs  09/20/15 0510  LABPROT 16.3*  INR 1.29   CXR: FINDINGS: Cardiac shadow is stable. A right-sided PICC line is now seen in satisfactory position. The right jugular sheath has been removed. Postsurgical changes are noted with aortic valve repair. The  overall inspiratory effort is poor although increased aeration in the left base is noted. No pneumothorax is seen.  IMPRESSION: Postsurgical changes with right-sided PICC line. Improved aeration in the left base is noted.   Assessment/Plan: S/P Procedure(s) (LRB): AORTIC VALVE REPLACEMENT (AVR) (N/A) TRANSESOPHAGEAL ECHOCARDIOGRAM (TEE) (N/A)  CV- AF/SVT, generally maintaining SR. Hopefully can switch Amiodarone from IV to po soon. Coumadin loading. BPs stable, continue Lopressor.  Expected postop blood loss anemia- H/H stable. Monitor.  Deconditioning- Continue CRPI, PT.  Vol overload- diurese.  Thrombocytopenia- plts improving. Continue to watch.  LOC today.  Disp- SNF when medically stable.   LOS: 4 days    COLLINS,GINA H 09/20/2015  I have seen and examined the patient and agree with the assessment and plan as outlined.  Convert amiodarone to oral.  Mobilize.  Check routine follow up ECHO today.  Rexene Alberts, MD 09/20/2015 8:40 AM

## 2015-09-20 NOTE — Progress Notes (Signed)
1355 PT to see pt this afternoon. We will follow their progress and begin seeing when pt can walk safely with safety device. Will follow. Graylon Good RN BSN 09/20/2015 1:53 PM

## 2015-09-20 NOTE — Progress Notes (Signed)
Pts EPW d/c'd per order and per protocol. Tips intact. Pt and family educated on need for vitals q15 and bedrest for 1 hour. Pt tolerated procedure well, will continue to monitor.

## 2015-09-20 NOTE — Progress Notes (Signed)
Physical Therapy Treatment Patient Details Name: Kayla Gray MRN: [id number] DOB: 02/01/1936 Today's Date: 09/20/2015    History of Present Illness 79 year old female with history of aortic stenosis, SVT, previous stroke, obstructive sleep apnea, Hyperlipidemia, iron deficiency anemia, peptic ulcer disease, and degenerative arthritis (s/p R TKA 2013 and L TKA 2016). She underwent aortic valve replacement 09-16-15.    PT Comments    Improving slowly, Emphasis on scooting and sit to stand following sternal precautions.  Still fatigues easily and is generally weak.   Follow Up Recommendations  SNF;Supervision/Assistance - 24 hour     Equipment Recommendations  None recommended by PT    Recommendations for Other Services       Precautions / Restrictions Precautions Precautions: Fall;Sternal Precaution Comments: Reinforced sternal precautions multiple times    Mobility  Bed Mobility                  Transfers Overall transfer level: Needs assistance   Transfers: Sit to/from Stand Sit to Stand: Mod assist         General transfer comment: verbal cues for sternal precautions and assist to come forward.  Ambulation/Gait Ambulation/Gait assistance: Min assist;Mod assist Ambulation Distance (Feet): 150 Feet Assistive device: Rolling walker (2 wheeled) Gait Pattern/deviations: Step-through pattern Gait velocity: decreased Gait velocity interpretation: Below normal speed for age/gender General Gait Details: neded vc's for posture, improved use of the RW and assist for minor stability and maneuvering RW onto course.  With fatigue pt needed more assist.   Stairs            Wheelchair Mobility    Modified Rankin (Stroke Patients Only)       Balance Overall balance assessment: Needs assistance Sitting-balance support: No upper extremity supported Sitting balance-Leahy Scale: Good       Standing balance-Leahy Scale: Poor Standing balance comment:  reliant on the RW                    Cognition Arousal/Alertness: Awake/alert Behavior During Therapy: WFL for tasks assessed/performed Overall Cognitive Status: Within Functional Limits for tasks assessed                      Exercises      General Comments General comments (skin integrity, edema, etc.): VSS throughout      Pertinent Vitals/Pain Pain Assessment: 0-10 Pain Score: 2  Pain Location: sternal Pain Descriptors / Indicators: Discomfort Pain Intervention(s): Monitored during session    Home Living                      Prior Function            PT Goals (current goals can now be found in the care plan section) Acute Rehab PT Goals Patient Stated Goal: not stated PT Goal Formulation: With patient Time For Goal Achievement: 10/02/15 Potential to Achieve Goals: Good Progress towards PT goals: Progressing toward goals    Frequency  Min 3X/week    PT Plan Current plan remains appropriate    Co-evaluation             End of Session   Activity Tolerance: Patient tolerated treatment well;Patient limited by fatigue Patient left: in chair;with call bell/phone within reach;with nursing/sitter in room     Time: 1423-1450 PT Time Calculation (min) (ACUTE ONLY): 27 min  Charges:  $Gait Training: 8-22 mins $Therapeutic Activity: 8-22 mins  G Codes:      Ibrohim Simmers, Tessie Fass 09/20/2015, 3:05 PM 09/20/2015  Donnella Sham, PT [phone]  (pager)

## 2015-09-20 NOTE — Clinical Social Work Placement (Signed)
   CLINICAL SOCIAL WORK PLACEMENT  NOTE  Date:  09/20/2015  Patient Details  Name: Kayla Gray MRN: [id number] Date of Birth: [birth year]  Clinical Social Work is seeking post-discharge placement for this patient at the [place] level of care (*CSW will initial, date and re-position this form in  chart as items are completed):  Yes   Patient/family provided with [place] Work Department's list of facilities offering this level of care within the geographic area requested by the patient (or if unable, by the patient's family).  Yes   Patient/family informed of their freedom to choose among providers that offer the needed level of care, that participate in Medicare, Medicaid or managed care program needed by the patient, have an available bed and are willing to accept the patient.  Yes   Patient/family informed of [name]'s ownership interest in [facility] and [facility], as well as of the fact that they are under no obligation to receive care at these facilities.  PASRR submitted to EDS on       PASRR number received on       Existing PASRR number confirmed on       FL2 transmitted to all facilities in geographic area requested by pt/family on 09/19/15     FL2 transmitted to all facilities within larger geographic area on       Patient informed that his/her managed care company has contracts with or will negotiate with certain facilities, including the following:   ([employer]- requires pre-authorization prior to SNF placement.)         Patient/family informed of bed offers received.  Patient chooses bed at       Physician recommends and patient chooses bed at      Patient to be transferred to   on  .  Patient to be transferred to facility by Ambulance  Corey Harold)     Patient family notified on   of transfer.  Name of family member notified:        PHYSICIAN Please prepare priority discharge summary, including  medications, Please sign FL2, Please prepare prescriptions     Additional Comment:    _______________________________________________ Williemae Area, LCSW 09/20/2015, 9:02 PM

## 2015-09-21 ENCOUNTER — Inpatient Hospital Stay (HOSPITAL_COMMUNITY): Payer: PPO

## 2015-09-21 DIAGNOSIS — I5030 Unspecified diastolic (congestive) heart failure: Secondary | ICD-10-CM

## 2015-09-21 LAB — GLUCOSE, CAPILLARY
GLUCOSE-CAPILLARY: 114 mg/dL — AB (ref 65–99)
Glucose-Capillary: 93 mg/dL (ref 65–99)
Glucose-Capillary: 95 mg/dL (ref 65–99)
Glucose-Capillary: 133 mg/dL — ABNORMAL HIGH (ref 65–99)
Glucose-Capillary: 111 mg/dL — ABNORMAL HIGH (ref 65–99)

## 2015-09-21 LAB — PROTIME-INR
INR: 1.53 — ABNORMAL HIGH (ref 0.00–1.49)
PROTHROMBIN TIME: 18.4 s — AB (ref 11.6–15.2)

## 2015-09-21 NOTE — Progress Notes (Signed)
  Echocardiogram 2D Echocardiogram has been performed.  Bobbye [place] 09/21/2015, 11:50 AM

## 2015-09-21 NOTE — Discharge Instructions (Addendum)
1. Please obtain vital signs at least one time daily 2.Please weigh the patient daily. If he or she continues to gain weight or develops lower extremity edema, contact the office at [phone]. 3. Ambulate patient at least three times daily and please use sternal precautions. 4. Please check PT/INR.... Keep INR between 2.0-3.0.Marland KitchenMarland KitchenMarland Kitchen Fax results to Dr. Donivan Scull office at [phone]      Aortic Valve Replacement, Care After Refer to this sheet in the next few weeks. These instructions provide you with information on caring for yourself after your procedure. Your health care provider may also give you specific instructions. Your treatment has been planned according to current medical practices, but problems sometimes occur. Call your health care provider if you have any problems or questions after your procedure. HOME CARE INSTRUCTIONS   Take medicines only as directed by your health care provider.  If your health care provider has prescribed elastic stockings, wear them as directed.  Take frequent naps or rest often throughout the day.  Avoid lifting over 10 lbs (4.5 kg) or pushing or pulling things with your arms for 6-8 weeks or as directed by your health care provider.  Avoid driving or airplane travel for 4-6 weeks after surgery or as directed by your health care provider. If you are riding in a car for an extended period, stop every 1-2 hours to stretch your legs. Keep a record of your medicines and medical history with you when traveling.  Do not drive or operate heavy machinery while taking pain medicine. (narcotics).  Do not cross your legs.  Do not use any tobacco products including cigarettes, chewing tobacco, or electronic cigarettes. If you need help quitting, ask your health care provider.  Do not take baths, swim, or use a hot tub until your health care provider approves. Take showers once your health care provider approves. Pat incisions dry. Do not rub incisions with a  washcloth or towel.  Avoid climbing stairs and using the handrail to pull yourself up for the first 2-3 weeks after surgery.  Return to work as directed by your health care provider.  Drink enough fluid to keep your urine clear or pale yellow.  Do not strain to have a bowel movement. Eat high-fiber foods if you become constipated. You may also take a medicine to help you have a bowel movement (laxative) as directed by your health care provider.  Resume sexual activity as directed by your health care provider. Men should not use medicines for erectile dysfunction until their doctor says it isokay.  If you had a certain type of heart condition in the past, you may need to take antibiotic medicine before having dental work or surgery. Let your dentist and health care providers know if you had one or more of the following:  Previous endocarditis.  An artificial (prosthetic) heart valve.  Congenital heart disease. SEEK MEDICAL CARE IF:  You develop a skin rash.   You experience sudden changes in your weight.  You have a fever. SEEK IMMEDIATE MEDICAL CARE IF:   You develop chest pain that is not coming from your incision.  You have drainage (pus), redness, swelling, or pain at your incision site.   You develop shortness of breath or have difficulty breathing.   You have increased bleeding from your incision site.   You develop light-headedness.  MAKE SURE YOU:   Understand these directions.  Will watch your condition.  Will get help right away if you are not doing well or  get worse.   This information is not intended to replace advice given to you by your health care provider. Make sure you discuss any questions you have with your health care provider.   Document Released: 04/19/2005 Document Revised: 10/22/2014 Document Reviewed: 07/15/2012 Elsevier Interactive Patient Education 2016 [employer].  _______________________________________ Information on my medicine  - Coumadin   (Warfarin)  This medication education was reviewed with me or my healthcare representative as part of my discharge preparation.  The pharmacist that spoke with me during my hospital stay was:  H[place]ons, Rocky Crafts, [facility]  Why was Coumadin prescribed for you? Coumadin was prescribed for you because you have a blood clot or a medical condition that can cause an increased risk of forming blood clots. Blood clots can cause serious health problems by blocking the flow of blood to the heart, lung, or brain. Coumadin can prevent harmful blood clots from forming. As a reminder your indication for Coumadin is:   Blood Clot Prevention After Heart Valve Surgery  What test will check on my response to Coumadin? While on Coumadin (warfarin) you will need to have an INR test regularly to ensure that your dose is keeping you in the desired range. The INR (international normalized ratio) number is calculated from the result of the laboratory test called prothrombin time (PT).  If an INR APPOINTMENT HAS NOT ALREADY BEEN MADE FOR YOU please schedule an appointment to have this lab work done by your health care provider within 7 days. Your INR goal is usually a number between:  2 to 3 or your provider may give you a more narrow range like 2-2.5.  Ask your health care provider during an office visit what your goal INR is.  What  do you need to  know  About  COUMADIN? Take Coumadin (warfarin) exactly as prescribed by your healthcare provider about the same time each day.  DO NOT stop taking without talking to the doctor who prescribed the medication.  Stopping without other blood clot prevention medication to take the place of Coumadin may increase your risk of developing a new clot or stroke.  Get refills before you run out.  What do you do if you miss a dose? If you miss a dose, take it as soon as you remember on the same day then continue your regularly scheduled regimen the next day.  Do not take  two doses of Coumadin at the same time.  Important Safety Information A possible side effect of Coumadin (Warfarin) is an increased risk of bleeding. You should call your healthcare provider right away if you experience any of the following: ? Bleeding from an injury or your nose that does not stop. ? Unusual colored urine (red or dark brown) or unusual colored stools (red or black). ? Unusual bruising for unknown reasons. ? A serious fall or if you hit your head (even if there is no bleeding).  Some foods or medicines interact with Coumadin (warfarin) and might alter your response to warfarin. To help avoid this: ? Eat a balanced diet, maintaining a consistent amount of Vitamin K. ? Notify your provider about major diet changes you plan to make. ? Avoid alcohol or limit your intake to 1 drink for women and 2 drinks for men per day. (1 drink is 5 oz. wine, 12 oz. beer, or 1.5 oz. liquor.)  Make sure that ANY health care provider who prescribes medication for you knows that you are taking Coumadin (warfarin).  Also make  sure the healthcare provider who is monitoring your Coumadin knows when you have started a new medication including herbals and non-prescription products.  Coumadin (Warfarin)  Major Drug Interactions  Increased Warfarin Effect Decreased Warfarin Effect  Alcohol (large quantities) Antibiotics (esp. Septra/Bactrim, Flagyl, Cipro) Amiodarone (Cordarone) Aspirin (ASA) Cimetidine (Tagamet) Megestrol (Megace) NSAIDs (ibuprofen, naproxen, etc.) Piroxicam (Feldene) Propafenone (Rythmol SR) Propranolol (Inderal) Isoniazid (INH) Posaconazole (Noxafil) Barbiturates (Phenobarbital) Carbamazepine (Tegretol) Chlordiazepoxide (Librium) Cholestyramine (Questran) Griseofulvin Oral Contraceptives Rifampin Sucralfate (Carafate) Vitamin K   Coumadin (Warfarin) Major Herbal Interactions  Increased Warfarin Effect Decreased Warfarin Effect  Garlic Ginseng Ginkgo biloba  Coenzyme Q10 Green tea St. Johns wort    Coumadin (Warfarin) FOOD Interactions  Eat a consistent number of servings per week of foods HIGH in Vitamin K (1 serving =  cup)  Collards (cooked, or boiled & drained) Kale (cooked, or boiled & drained) Mustard greens (cooked, or boiled & drained) Parsley *serving size only =  cup Spinach (cooked, or boiled & drained) Swiss chard (cooked, or boiled & drained) Turnip greens (cooked, or boiled & drained)  Eat a consistent number of servings per week of foods MEDIUM-HIGH in Vitamin K (1 serving = 1 cup)  Asparagus (cooked, or boiled & drained) Broccoli (cooked, boiled & drained, or raw & chopped) Brussel sprouts (cooked, or boiled & drained) *serving size only =  cup Lettuce, raw (green leaf, endive, romaine) Spinach, raw Turnip greens, raw & chopped   These websites have more information on Coumadin (warfarin):  FailFactory.se; VeganReport.com.au;    Aortic Valve Replacement, Care After Refer to this sheet in the next few weeks. These instructions provide you with information on caring for yourself after your procedure. Your health care provider may also give you specific instructions. Your treatment has been planned according to current medical practices, but problems sometimes occur. Call your health care provider if you have any problems or questions after your procedure. HOME CARE INSTRUCTIONS   Take medicines only as directed by your health care provider.  If your health care provider has prescribed elastic stockings, wear them as directed.  Take frequent naps or rest often throughout the day.  Avoid lifting over 10 lbs (4.5 kg) or pushing or pulling things with your arms for 6-8 weeks or as directed by your health care provider.  Avoid driving or airplane travel for 4-6 weeks after surgery or as directed by your health care provider. If you are riding in a car for an extended period, stop every 1-2 hours to  stretch your legs. Keep a record of your medicines and medical history with you when traveling.  Do not drive or operate heavy machinery while taking pain medicine. (narcotics).  Do not cross your legs.  Do not use any tobacco products including cigarettes, chewing tobacco, or electronic cigarettes. If you need help quitting, ask your health care provider.  Do not take baths, swim, or use a hot tub until your health care provider approves. Take showers once your health care provider approves. Pat incisions dry. Do not rub incisions with a washcloth or towel.  Avoid climbing stairs and using the handrail to pull yourself up for the first 2-3 weeks after surgery.  Return to work as directed by your health care provider.  Drink enough fluid to keep your urine clear or pale yellow.  Do not strain to have a bowel movement. Eat high-fiber foods if you become constipated. You may also take a medicine to help you have a bowel movement (laxative) as  directed by your health care provider.  Resume sexual activity as directed by your health care provider. Men should not use medicines for erectile dysfunction until their doctor says it isokay.  If you had a certain type of heart condition in the past, you may need to take antibiotic medicine before having dental work or surgery. Let your dentist and health care providers know if you had one or more of the following:  Previous endocarditis.  An artificial (prosthetic) heart valve.  Congenital heart disease. SEEK MEDICAL CARE IF:  You develop a skin rash.   You experience sudden changes in your weight.  You have a fever. SEEK IMMEDIATE MEDICAL CARE IF:   You develop chest pain that is not coming from your incision.  You have drainage (pus), redness, swelling, or pain at your incision site.   You develop shortness of breath or have difficulty breathing.   You have increased bleeding from your incision site.   You develop  light-headedness.  MAKE SURE YOU:   Understand these directions.  Will watch your condition.  Will get help right away if you are not doing well or get worse.   This information is not intended to replace advice given to you by your health care provider. Make sure you discuss any questions you have with your health care provider.   Document Released: 04/19/2005 Document Revised: 10/22/2014 Document Reviewed: 07/15/2012 Elsevier Interactive Patient Education [employer].

## 2015-09-21 NOTE — Progress Notes (Addendum)
       [address]       [place] [address]             [phone]          5 Days Post-Op Procedure(s) (LRB): AORTIC VALVE REPLACEMENT (AVR) (N/A) TRANSESOPHAGEAL ECHOCARDIOGRAM (TEE) (N/A)  Subjective: Feeling better today. Bowels working and appetite improved. Did a little better with PT yesterday.   Objective: Vital signs in last 24 hours: Patient Vitals for the past 24 hrs:  BP Temp Temp src Pulse Resp SpO2 Weight  09/21/15 0410 (!) 142/70 mmHg 98.4 F (36.9 C) Oral 81 20 93 % 176 lb 12.9 oz (80.2 kg)  09/20/15 2050 136/71 mmHg 98.7 F (37.1 C) Oral 77 - 90 % -  09/20/15 1416 109/60 mmHg 98.7 F (37.1 C) - 73 - 94 % -  09/20/15 1400 101/62 mmHg - - 74 - - -  09/20/15 1340 100/64 mmHg - - 78 - - -  09/20/15 1321 110/60 mmHg - - 74 - - -  09/20/15 1313 117/63 mmHg - - 76 - 92 % -   Current Weight  09/21/15 176 lb 12.9 oz (80.2 kg)  BASELINE WEIGHT:76 kg   Intake/Output from previous day: 12/06 0701 - 12/07 0700 In: 220 [P.O.:220] Out: 500 [Urine:500]    PHYSICAL EXAM:  Heart: RRR Lungs: Clear Wound: Clean and dry Extremities: No edema    Lab Results: CBC: Recent Labs  09/19/15 0342 09/20/15 0510  WBC 9.7 6.4  HGB 8.7* 8.4*  HCT 26.9* 26.2*  PLT 88* 105*   BMET:  Recent Labs  09/19/15 0342 09/20/15 0510  NA 139 139  K 3.8 3.5  CL 107 106  CO2 28 27  GLUCOSE 108* 107*  BUN 22* 17  CREATININE 1.00 0.86  CALCIUM 8.1* 8.1*    PT/INR:  Recent Labs  09/21/15 0400  LABPROT 18.4*  INR 1.53*      Assessment/Plan: S/P Procedure(s) (LRB): AORTIC VALVE REPLACEMENT (AVR) (N/A) TRANSESOPHAGEAL ECHOCARDIOGRAM (TEE) (N/A)  CV- postop AF/ h/o SVT, now maintaining SR.Continue Amio, Lopressor, Coumadin. Follow up echo not done yet.  Expected postop blood loss anemia- H/H stable. Monitor.  Deconditioning- Continue CRPI, PT.  Vol overload- Continue diuresis.  Thrombocytopenia- plts improving. Continue to  watch.  Disp- hopefully to SNF in next day or 2 if she continues to progress.   LOS: 5 days    COLLINS,GINA H 09/21/2015  I have seen and examined the patient and agree with the assessment and plan as outlined.  Tentatively plan d/c to SNF tomorrow.  Rexene Alberts, MD 09/21/2015 9:15 AM

## 2015-09-21 NOTE — Progress Notes (Signed)
CARDIAC REHAB PHASE I   PRE:  Rate/Rhythm: 85 SR    BP: sitting 117/60    SaO2: 91 RA  MODE:  Ambulation: 350 ft   POST:  Rate/Rhythm: 97 SR    BP: sitting 112/65     SaO2: 96 RA  Pt able to stand with min assist and use RW. Increased distance today, more confidence. To BSC for BM after walk. VSS. To bed for rest. Will f/u am for education. [id number]   Josephina Shih [place] CES, ACSM 09/21/2015 1:59 PM

## 2015-09-21 NOTE — Discharge Summary (Addendum)
Physician Discharge Summary  Patient ID: Kayla Gray MRN: [id number] DOB/AGE: Dec 13, 1935 79 y.o.  Admit date: 09/16/2015 Discharge date: 09/22/2015  Admission Diagnoses:  Patient Active Problem List   Diagnosis Date Noted  . Chronic diastolic (congestive) heart failure ([place])   . SOB (shortness of breath) 08/17/2015  . Angina pectoris ([place]) 08/17/2015  . Severe aortic valve stenosis 08/12/2015  . Low back pain 08/12/2015  . Sinus tachycardia ([place]) 01/06/2014  . Osteoarthritis of both knees 01/06/2014  . CVA (cerebral vascular accident) ([place]) 11/25/2013  . Aortic valve stenosis 11/27/2012  . SVT (supraventricular tachycardia) ([place]) 11/27/2012  . Syncope 11/27/2012   Discharge Diagnoses:   Patient Active Problem List   Diagnosis Date Noted  . S/P aortic valve replacement with bioprosthetic valve 09/16/2015  . Chronic diastolic (congestive) heart failure ([place])   . SOB (shortness of breath) 08/17/2015  . Angina pectoris ([place]) 08/17/2015  . Severe aortic valve stenosis 08/12/2015  . Low back pain 08/12/2015  . Sinus tachycardia ([place]) 01/06/2014  . Osteoarthritis of both knees 01/06/2014  . CVA (cerebral vascular accident) ([place]) 11/25/2013  . Aortic valve stenosis 11/27/2012  . SVT (supraventricular tachycardia) ([place]) 11/27/2012  . Syncope 11/27/2012   Discharged Condition: Stable and discharged to home  History of Present Illness:  Kayla Gray is a 79 year old female with history of aortic stenosis, SVT, previous stroke, obstructive sleep apnea, Hyperlipidemia, iron deficiency anemia, peptic ulcer disease, and degenerative arthritis who has been referred for surgical consultation to discuss treatment options for management of severe symptomatic aortic stenosis. Patient states that she has been told that she had a heart murmur for most of her adult life. She was first diagnosed with aortic stenosis in February 2013 when she developed symptomatic SVT while she was rehabilitating  following total knee replacement surgery. She was started on diltiazem at that time and an echocardiogram was performed demonstrating the presence of aortic stenosis. Possible referral for surgical consultation was discussed, but the patient remained otherwise asymptomatic and was followed medically. In February 2015 the patient suffered a brief syncopal episode with symptoms of left-sided weakness felt consistent with minor stroke or TIA. She was treated with aspirin and Plavix and 30 day event monitor did not reveal arrhythmia. The patient recently developed symptoms of exertional shortness of breath. Follow-up transthoracic echocardiogram revealed significant progression in the severity of aortic stenosis with peak velocity across aortic valve measured 5.1 m/s corresponding to mean transvalvular gradient estimated 54 mmHg. Left ventricular systolic function remained preserved with ejection fraction estimated 60-65%. The patient underwent diagnostic cardiac catheterization by Dr. [name] on 08/17/2015 confirming the presence of severe aortic stenosis. The patient did not have significant coronary artery disease. She was subsequently referred for surgical consultation.  She was initially evaluated by Dr. Roxy Manns on 08/23/2015 at which time surgical intervention was offered as the best course of treatment.  The risks and benefits of the procedure were explained to the patient.  She would require CTA of the chest, abdomen, and pelvis prior to proceeding to assess vascular access.  The patient wished to think about her options.  She again presented to Dr. Guy Sandifer office on 09/12/2015 at which time the patient was agreeable to proceed with surgery.  Again risks and benefits of the procedure were explained to the patient and she was agreeable to proceed.  Hospital Course:   Kayla Gray presented to [facility] on 09/16/2015.  She was taken to the operating room and underwent Aortic Valve Replacement utilizing a  23  mm Edwards Intuity Elite Pericardial Tissue Valve.  She tolerated the procedure without difficulty and was taken to the SICU in stable condition.  The patient was extubated the evening of surgery.  During her stay in the SICU the patient was weaned off Dopamine as tolerated.  She developed post operative Atrial Fibrillation and was subsequently treated with Amiodarone.  Her chest tubes and arterial lines were removed without difficulty.  She was started on Coumadin therapy.  She was ambulating with assistance, but overall was pretty weak.  She was transferred to the step down unit on POD #3.  The patient continued to make slow progress.  She was maintaining NSR and has been transitioned to an oral regimen of Amiodarone.  Her temporary pacing wires were removed without difficulty.  Her INR is slowly trending up and is currently 1.57.  She will be discharged on 2.5 mg of Coumadin with a goal INR range of 2.0-2.5.  She continues to ambulate with assistance.  She is felt surgically stable for discharge to home with home health and home PT.       Significant Diagnostic Studies: ECHO   - Left ventricle: The cavity size was normal. There was mild concentric hypertrophy. Systolic function was normal. The estimated ejection fraction was in the range of 60% to 65%. Wall motion was normal; there were no regional wall motion abnormalities. Doppler parameters are consistent with abnormal left ventricular relaxation (grade 1 diastolic dysfunction). - Aortic valve: There was severe stenosis. Mean gradient (S): 54 mm Hg. Peak gradient (S): 104 mm Hg. Valve area (VTI): 0.59 cm^2. - Mitral valve: There was mild regurgitation. - Left atrium: The atrium was mildly dilated.  Treatments: surgery:    Aortic Valve Replacement Edwards Intuity Elite Pericardial Tissue Valve (size 23 mm, model # [id number], serial # [id number])  Disposition:SNF     Discharge Instructions    Amb Referral to Cardiac  Rehabilitation    Complete by:  As directed   Diagnosis:  Valve Replacement/Repair            Medication List    STOP taking these medications        clopidogrel 75 MG tablet  Commonly known as:  PLAVIX     diltiazem 120 MG tablet  Commonly known as:  CARDIZEM     propranolol 20 MG tablet  Commonly known as:  INDERAL      TAKE these medications        acetaminophen 325 MG tablet  Commonly known as:  TYLENOL  Take 650 mg by mouth every 6 (six) hours as needed.     amiodarone 200 MG tablet  Commonly known as:  PACERONE  Take 1 tablet (200 mg total) by mouth daily.     aspirin 81 MG tablet  Take 81 mg by mouth daily.     CALCIUM 600 + D PO  Take by mouth 2 (two) times daily.     ezetimibe 10 MG tablet  Commonly known as:  ZETIA  Take 10 mg by mouth daily.     fluticasone 50 MCG/ACT nasal spray  Commonly known as:  FLONASE  Place 2 sprays into the nose daily as needed.     metoprolol tartrate 25 MG tablet  Commonly known as:  LOPRESSOR  Take 1 tablet (25 mg total) by mouth 2 (two) times daily.     multivitamin tablet  Take 1 tablet by mouth daily.     NON FORMULARY  at bedtime. CPAP  pantoprazole 40 MG tablet  Commonly known as:  PROTONIX  Take 40 mg by mouth daily.     PARoxetine 20 MG tablet  Commonly known as:  PAXIL  Take 20 mg by mouth daily.     traMADol 50 MG tablet  Commonly known as:  ULTRAM  Take 1 tablet (50 mg total) by mouth every 6 (six) hours as needed for moderate pain.     warfarin 2.5 MG tablet  Commonly known as:  COUMADIN  Take 1 tablet (2.5 mg total) by mouth daily at 6 PM. Or as directed by Dr. [name]       The patient has been discharged on:   1.Beta Blocker:  Yes [ x  ]                              No   [   ]                              If No, reason:  2.Ace Inhibitor/ARB: Yes [   ]                                     No  [  x  ]                                     If No, reason: labile BP  3.Statin:   Yes [    ]                  No  [ x  ]                  If No, reason: NO CAD, allergy  4.[place]:  Yes  [  x ]                  No   [   ]                  If No, reason:   Follow-up Information    Follow up with Rexene Alberts, MD On 10/23/2014.   Specialty:  Cardiothoracic Surgery   Why:  Appointment is at 4:30   Contact information:   [address] [place] [address] [phone]       Follow up with [facility] On 10/23/2014.   Why:  Please get CXR at 4:00    Contact information:   [facility]       Follow up with Ida Rogue, MD On 10/13/2015.   Specialty:  Cardiology   Why:  Appointment is at 2:20   Contact information:   [place] [address] [phone]       Follow up with Home Health.   Why:  Please draw PT and INR (as is on Coumadin) on Monday 09/26/2015 and call or fax results to Dr. Donivan Scull office. INR to be between 2-2.5      Follow up with [name], [name], MD.   Specialty:  Family Medicine   Why:  Call for a follow up appointment regarding further surveillance of HGA1C 5.8 (pre diabetes)   Contact information:   [address]  [facility] [place] [address] [phone]       Signed: Nani Skillern PA-C 09/22/2015, 1:32 PM  I agree with the above discharge summary and plan for follow-up.  Nani Skillern, MD

## 2015-09-22 LAB — PROTIME-INR
INR: 1.57 — AB (ref 0.00–1.49)
Prothrombin Time: 18.8 seconds — ABNORMAL HIGH (ref 11.6–15.2)

## 2015-09-22 LAB — GLUCOSE, CAPILLARY: GLUCOSE-CAPILLARY: 104 mg/dL — AB (ref 65–99)

## 2015-09-22 MED ORDER — COUMADIN BOOK
1.0000 | Freq: Once | Status: DC
Start: 2015-09-22 — End: 2015-09-22

## 2015-09-22 MED ORDER — INFLUENZA VAC SPLIT QUAD 0.5 ML IM SUSY: 0.5000 mL | PREFILLED_SYRINGE | INTRAMUSCULAR | Status: DC

## 2015-09-22 MED ORDER — TRAMADOL HCL 50 MG PO TABS
50.0000 mg | ORAL_TABLET | ORAL | Status: DC | PRN
Start: 2015-09-22 — End: 2015-09-22

## 2015-09-22 MED ORDER — WARFARIN SODIUM 2.5 MG PO TABS: 2.5000 mg | ORAL_TABLET | Freq: Every day | ORAL | Status: DC

## 2015-09-22 MED ORDER — METOPROLOL TARTRATE 25 MG PO TABS: 25.0000 mg | ORAL_TABLET | Freq: Two times a day (BID) | ORAL | Status: DC

## 2015-09-22 MED ORDER — TRAMADOL HCL 50 MG PO TABS: 50.0000 mg | ORAL_TABLET | Freq: Four times a day (QID) | ORAL | Status: DC | PRN

## 2015-09-22 MED ORDER — AMIODARONE HCL 200 MG PO TABS: 200.0000 mg | ORAL_TABLET | Freq: Every day | ORAL | Status: DC

## 2015-09-22 NOTE — Care Management Note (Signed)
Case Management Note Previous CM note initiated by Luz Lex RN CM  Patient Details  Name: Kayla Gray MRN: [id number] Date of Birth: 09/18/1936  Subjective/Objective:   Patient sitting up in chair.  Staff states very weak, slow to progress.  Patient prior to surgery lived at home with husband.  States husband will not be able to assist on discharge.  She states no other family, friends available to assist.   When asked what her plan on discharge is, states she would like to go to [place] for rehab prior to going  Home.  PT recommending SNF also.  SW consult placed.                   Action/Plan:   Expected Discharge Date:     09/22/15             Expected Discharge Plan:  [place]  In-House Referral:  Clinical Social Work  Discharge planning Services  CM Consult  Post Acute Care Choice:  Home Health Choice offered to:  Patient  DME Arranged:  N/A DME Agency:  NA  HH Arranged:  RN, PT HH Agency:  [place]  Status of Service:  Completed, signed off  Medicare Important Message Given:  Yes Date Medicare IM Given:    Medicare IM give by:    Date Additional Medicare IM Given:    Additional Medicare Important Message give by:     If discussed at [place] of Stay Meetings, dates discussed:  09/22/15  Additional Comments:  Update -09/22/15- 1300- Marvetta Gibbons RN, BSN - received call from bedside RN that plan has now changed to pt going home with [facility]- have asked that orders be placed for HH-RN/PT, per pt she already has RW and other needed DME at home- she has used [place] for [facility] in the past and would like to use them again (backup if not in network will be [facility])- referral for HH-RN/PT  Called to [place] with [facility] who confirmed that they could take referral- verbal order given by the PA that PT/INR check would be needed on Monday 09/26/15.  Pt to d/c home with spouse/family with [facility] services (has decided not to  appeal SNF denial)   09/22/15- pt for  d/c today to STSNF, CSW following for placement needs  Dawayne Sybilla, RN 09/22/2015, 2:46 PM

## 2015-09-22 NOTE — Progress Notes (Signed)
CARDIAC REHAB PHASE I   PRE:  Rate/Rhythm: 86 SR  BP:  Supine:   Sitting: 131/71  Standing:    SaO2: 89-90%RA  MODE:  Ambulation: 350 ft   POST:  Rate/Rhythm: 106 ST  BP:  Supine:   Sitting: 140/97  Standing:    SaO2: 93%RA 0955-1030 Pt walked 350 ft on RA with rolling walker and minimal asst. Tolerated well but did not want to increase distance. To recliner after walk with call bell. Pt stated she has walker at home. Education completed with pt and husband who voiced understanding. Encouraged IS and walks. Encouraged pt to watch sodium and carbs. Gave heart healthy diet and pt aware of watching green leafy vegs with Coumadin. Discussed CRP 2 and referring to [facility]. Put on discharge video to view.    Graylon Good, RN BSN  09/22/2015 10:58 AM

## 2015-09-22 NOTE — Progress Notes (Signed)
Pt has refused CPAP for the night.  RT to monitor and assess as needed.  

## 2015-09-22 NOTE — Progress Notes (Signed)
Patient to D/C home with husband. PICC removed. Peripheral IV removed. Tele box removed. Education done. Patient belongings given to husband. Patient taken to front to D/C home with home health.   Domingo Dimes RN

## 2015-09-22 NOTE — Care Management Note (Signed)
Case Management Note Previous CM note initiated by Luz Lex RN CM  Patient Details  Name: Kayla Gray MRN: [id number] Date of Birth: 1935/10/30  Subjective/Objective:   Patient sitting up in chair.  Staff states very weak, slow to progress.  Patient prior to surgery lived at home with husband.  States husband will not be able to assist on discharge.  She states no other family, friends available to assist.   When asked what her plan on discharge is, states she would like to go to [place] for rehab prior to going  Home.  PT recommending SNF also.  SW consult placed.                   Action/Plan:   Expected Discharge Date:     12//16             Expected Discharge Plan:  Skilled Nursing Facility  In-House Referral:  Clinical Social Work  Discharge planning Services  CM Consult  Post Acute Care Choice:  NA Choice offered to:  NA  DME Arranged:  N/A DME Agency:  NA  HH Arranged:  NA HH Agency:  NA  Status of Service:  Completed, signed off  Medicare Important Message Given:  Yes Date Medicare IM Given:    Medicare IM give by:    Date Additional Medicare IM Given:    Additional Medicare Important Message give by:     If discussed at [place] of Stay Meetings, dates discussed:  09/22/15  Additional Comments:  09/22/15- pt for d/c today to STSNF, CSW following for placement needs  Dawayne Karysa, RN 09/22/2015, 11:06 AM

## 2015-09-22 NOTE — Progress Notes (Signed)
PT Cancellation Note  Patient Details Name: Kayla Gray MRN: [id number] DOB: Mar 13, 1936   Cancelled Treatment:    Reason Eval/Treat Not Completed: Patient declined, no reason specified.  I'm hoping to go home, I'm not walking right now. 09/22/2015  Kayla Gray, [place] [phone]  (pager)   Treston Coker, Tessie Fass 09/22/2015, 1:55 PM

## 2015-09-22 NOTE — Progress Notes (Addendum)
      [address]       [place], [address]             [phone]        6 Days Post-Op Procedure(s) (LRB): AORTIC VALVE REPLACEMENT (AVR) (N/A) TRANSESOPHAGEAL ECHOCARDIOGRAM (TEE) (N/A)  Subjective: Patient without complaints this am. She hopes to go to SNF.  Objective: Vital signs in last 24 hours: Temp:  [98.1 F (36.7 C)-99.1 F (37.3 C)] 99.1 F (37.3 C) (12/08 0513) Pulse Rate:  [72-79] 79 (12/08 0513) Cardiac Rhythm:  [-] Normal sinus rhythm;Bundle branch block (12/07 1903) Resp:  [18-20] 20 (12/08 0513) BP: (113-153)/(58-74) 153/74 mmHg (12/08 0513) SpO2:  [91 %-92 %] 92 % (12/08 0513) Weight:  [171 lb 1.6 oz (77.61 kg)] 171 lb 1.6 oz (77.61 kg) (12/08 0513)  Pre op weight 76 kg Current Weight  09/22/15 171 lb 1.6 oz (77.61 kg)       Intake/Output from previous day: 12/07 0701 - 12/08 0700 In: 510 [P.O.:480; I.V.:30] Out: 125 [Urine:125]   Physical Exam:  Cardiovascular: RRR, soft flow murmur Pulmonary: Clear to auscultation bilaterally; no rales, wheezes, or rhonchi. Abdomen: Soft, non tender, bowel sounds present. Extremities: Trace bilateral lower extremity edema. Wounds: Clean and dry.  No erythema or signs of infection.  Lab Results: CBC: Recent Labs  09/20/15 0510  WBC 6.4  HGB 8.4*  HCT 26.2*  PLT 105*   BMET:  Recent Labs  09/20/15 0510  NA 139  K 3.5  CL 106  CO2 27  GLUCOSE 107*  BUN 17  CREATININE 0.86  CALCIUM 8.1*    PT/INR:  Lab Results  Component Value Date   INR 1.57* 09/22/2015   INR 1.53* 09/21/2015   INR 1.29 09/20/2015   ABG:  INR: Will add last result for INR, ABG once components are confirmed Will add last 4 CBG results once components are confirmed  Assessment/Plan:  1. CV - Previous a fib, history of SVT. Maintaining SR in the 90's this am. On Amiodarone 200 mg bid, Lopressor 25 mg bid, and Coumadin. INR slightly increased to 1.57.Echo done yesterday showed LVEF 55-60%, no  regional wall. abnormalities, no AS or AI, and no pericardial effusion. 2.  Pulmonary - On room air. Encourage incentive spirometer. 3. Volume Overload - On Lasix 40 mg daily 4.  Acute blood loss anemia - Last H and H 8.4 and 26.2 5. Mild thrombocytopenia-last platelets 105,000 6. Remove sutures 7. Hopefully, to SNF today  ZIMMERMAN,DONIELLE MPA-C 09/22/2015,8:02 AM  I have seen and examined the patient and agree with the assessment and plan as outlined.  D/C home today.  Rexene Alberts, MD 09/22/2015 10:18 AM

## 2015-09-22 NOTE — Progress Notes (Signed)
Pt confirms choice to go to [facility] request was initiated but initial review was denied and case is now being sent to the medical director for review- this process can take up to 24 hours  CSW will continue to follow  Domenica Reamer, [place] Worker [phone]

## 2015-09-26 LAB — POCT INR: INR: 1.6

## 2015-09-29 ENCOUNTER — Telehealth: Payer: Self-pay | Admitting: *Deleted

## 2015-09-29 ENCOUNTER — Ambulatory Visit (INDEPENDENT_AMBULATORY_CARE_PROVIDER_SITE_OTHER): Payer: PPO | Admitting: Cardiovascular Disease

## 2015-09-29 DIAGNOSIS — Z954 Presence of other heart-valve replacement: Secondary | ICD-10-CM

## 2015-09-29 DIAGNOSIS — Z5181 Encounter for therapeutic drug level monitoring: Secondary | ICD-10-CM

## 2015-09-29 DIAGNOSIS — I6310 Cerebral infarction due to embolism of unspecified precerebral artery: Secondary | ICD-10-CM

## 2015-09-29 DIAGNOSIS — I350 Nonrheumatic aortic (valve) stenosis: Secondary | ICD-10-CM

## 2015-09-29 DIAGNOSIS — Z953 Presence of xenogenic heart valve: Secondary | ICD-10-CM

## 2015-09-29 NOTE — Telephone Encounter (Signed)
Called and spoke with Elmyra Ricks RN with [facility].  Orders given to [facility] today & updated the telephone information to contact us since the INR was obtained on Monday. Please refer to Anticoagulation Track.

## 2015-09-29 NOTE — Telephone Encounter (Signed)
Nurse calling stating she faxed over results on Monday  INR was 1.6 PT was 19 Would like to know what we would like to do  Please advise.  And pt is on 2.5 mg coumadin daily.

## 2015-10-05 ENCOUNTER — Ambulatory Visit (INDEPENDENT_AMBULATORY_CARE_PROVIDER_SITE_OTHER): Payer: PPO | Admitting: Internal Medicine

## 2015-10-05 DIAGNOSIS — Z5181 Encounter for therapeutic drug level monitoring: Secondary | ICD-10-CM

## 2015-10-05 DIAGNOSIS — Z953 Presence of xenogenic heart valve: Secondary | ICD-10-CM

## 2015-10-05 DIAGNOSIS — Z954 Presence of other heart-valve replacement: Secondary | ICD-10-CM

## 2015-10-05 DIAGNOSIS — I6310 Cerebral infarction due to embolism of unspecified precerebral artery: Secondary | ICD-10-CM

## 2015-10-05 DIAGNOSIS — I350 Nonrheumatic aortic (valve) stenosis: Secondary | ICD-10-CM

## 2015-10-05 LAB — POCT INR: INR: 1.5

## 2015-10-12 ENCOUNTER — Ambulatory Visit (INDEPENDENT_AMBULATORY_CARE_PROVIDER_SITE_OTHER): Payer: PPO | Admitting: Pharmacist

## 2015-10-12 DIAGNOSIS — Z953 Presence of xenogenic heart valve: Secondary | ICD-10-CM

## 2015-10-12 DIAGNOSIS — I350 Nonrheumatic aortic (valve) stenosis: Secondary | ICD-10-CM

## 2015-10-12 DIAGNOSIS — Z5181 Encounter for therapeutic drug level monitoring: Secondary | ICD-10-CM

## 2015-10-12 DIAGNOSIS — Z954 Presence of other heart-valve replacement: Secondary | ICD-10-CM

## 2015-10-12 DIAGNOSIS — I6310 Cerebral infarction due to embolism of unspecified precerebral artery: Secondary | ICD-10-CM

## 2015-10-12 LAB — POCT INR: INR: 1.7

## 2015-10-13 ENCOUNTER — Ambulatory Visit: Payer: PPO | Admitting: Cardiovascular Disease

## 2015-10-18 DIAGNOSIS — F329 Major depressive disorder, single episode, unspecified: Secondary | ICD-10-CM | POA: Diagnosis not present

## 2015-10-18 DIAGNOSIS — Z5181 Encounter for therapeutic drug level monitoring: Secondary | ICD-10-CM | POA: Diagnosis not present

## 2015-10-18 DIAGNOSIS — Z79891 Long term (current) use of opiate analgesic: Secondary | ICD-10-CM | POA: Diagnosis not present

## 2015-10-18 DIAGNOSIS — Z7901 Long term (current) use of anticoagulants: Secondary | ICD-10-CM | POA: Diagnosis not present

## 2015-10-18 DIAGNOSIS — R011 Cardiac murmur, unspecified: Secondary | ICD-10-CM | POA: Diagnosis not present

## 2015-10-18 DIAGNOSIS — E785 Hyperlipidemia, unspecified: Secondary | ICD-10-CM | POA: Diagnosis not present

## 2015-10-18 DIAGNOSIS — Z48812 Encounter for surgical aftercare following surgery on the circulatory system: Secondary | ICD-10-CM | POA: Diagnosis not present

## 2015-10-18 DIAGNOSIS — I471 Supraventricular tachycardia: Secondary | ICD-10-CM | POA: Diagnosis not present

## 2015-10-18 DIAGNOSIS — I5032 Chronic diastolic (congestive) heart failure: Secondary | ICD-10-CM | POA: Diagnosis not present

## 2015-10-18 DIAGNOSIS — Z952 Presence of prosthetic heart valve: Secondary | ICD-10-CM | POA: Diagnosis not present

## 2015-10-18 DIAGNOSIS — Z8673 Personal history of transient ischemic attack (TIA), and cerebral infarction without residual deficits: Secondary | ICD-10-CM | POA: Diagnosis not present

## 2015-10-18 DIAGNOSIS — Z96653 Presence of artificial knee joint, bilateral: Secondary | ICD-10-CM | POA: Diagnosis not present

## 2015-10-18 DIAGNOSIS — M1990 Unspecified osteoarthritis, unspecified site: Secondary | ICD-10-CM | POA: Diagnosis not present

## 2015-10-19 ENCOUNTER — Ambulatory Visit (INDEPENDENT_AMBULATORY_CARE_PROVIDER_SITE_OTHER): Payer: PPO | Admitting: Cardiology

## 2015-10-19 DIAGNOSIS — Z954 Presence of other heart-valve replacement: Secondary | ICD-10-CM

## 2015-10-19 DIAGNOSIS — Z953 Presence of xenogenic heart valve: Secondary | ICD-10-CM

## 2015-10-19 DIAGNOSIS — Z5181 Encounter for therapeutic drug level monitoring: Secondary | ICD-10-CM

## 2015-10-19 DIAGNOSIS — I6310 Cerebral infarction due to embolism of unspecified precerebral artery: Secondary | ICD-10-CM

## 2015-10-19 DIAGNOSIS — I350 Nonrheumatic aortic (valve) stenosis: Secondary | ICD-10-CM

## 2015-10-19 LAB — POCT INR: INR: 2.4

## 2015-10-21 ENCOUNTER — Other Ambulatory Visit: Payer: Self-pay | Admitting: Thoracic Surgery (Cardiothoracic Vascular Surgery)

## 2015-10-21 DIAGNOSIS — Z952 Presence of prosthetic heart valve: Secondary | ICD-10-CM

## 2015-10-24 ENCOUNTER — Ambulatory Visit: Payer: PPO | Admitting: Thoracic Surgery (Cardiothoracic Vascular Surgery)

## 2015-10-24 ENCOUNTER — Ambulatory Visit: Payer: Self-pay | Admitting: Thoracic Surgery (Cardiothoracic Vascular Surgery)

## 2015-10-25 DIAGNOSIS — Z7901 Long term (current) use of anticoagulants: Secondary | ICD-10-CM | POA: Diagnosis not present

## 2015-10-25 DIAGNOSIS — Z48812 Encounter for surgical aftercare following surgery on the circulatory system: Secondary | ICD-10-CM | POA: Diagnosis not present

## 2015-10-25 DIAGNOSIS — I5032 Chronic diastolic (congestive) heart failure: Secondary | ICD-10-CM | POA: Diagnosis not present

## 2015-10-25 DIAGNOSIS — M1990 Unspecified osteoarthritis, unspecified site: Secondary | ICD-10-CM | POA: Diagnosis not present

## 2015-10-25 DIAGNOSIS — Z5181 Encounter for therapeutic drug level monitoring: Secondary | ICD-10-CM | POA: Diagnosis not present

## 2015-10-25 DIAGNOSIS — F329 Major depressive disorder, single episode, unspecified: Secondary | ICD-10-CM | POA: Diagnosis not present

## 2015-10-25 DIAGNOSIS — I471 Supraventricular tachycardia: Secondary | ICD-10-CM | POA: Diagnosis not present

## 2015-10-25 DIAGNOSIS — E785 Hyperlipidemia, unspecified: Secondary | ICD-10-CM | POA: Diagnosis not present

## 2015-10-25 DIAGNOSIS — Z8673 Personal history of transient ischemic attack (TIA), and cerebral infarction without residual deficits: Secondary | ICD-10-CM | POA: Diagnosis not present

## 2015-10-25 DIAGNOSIS — Z952 Presence of prosthetic heart valve: Secondary | ICD-10-CM | POA: Diagnosis not present

## 2015-10-25 DIAGNOSIS — R011 Cardiac murmur, unspecified: Secondary | ICD-10-CM | POA: Diagnosis not present

## 2015-10-25 DIAGNOSIS — Z79891 Long term (current) use of opiate analgesic: Secondary | ICD-10-CM | POA: Diagnosis not present

## 2015-10-25 DIAGNOSIS — Z96653 Presence of artificial knee joint, bilateral: Secondary | ICD-10-CM | POA: Diagnosis not present

## 2015-10-27 ENCOUNTER — Telehealth: Payer: Self-pay

## 2015-10-27 DIAGNOSIS — R739 Hyperglycemia, unspecified: Secondary | ICD-10-CM | POA: Diagnosis not present

## 2015-10-27 DIAGNOSIS — E7800 Pure hypercholesterolemia, unspecified: Secondary | ICD-10-CM | POA: Diagnosis not present

## 2015-10-27 DIAGNOSIS — Z79899 Other long term (current) drug therapy: Secondary | ICD-10-CM | POA: Diagnosis not present

## 2015-10-27 NOTE — Telephone Encounter (Signed)
Spoke w/ pt.  Advised her to contact Dr. Guy Sandifer office, as he was performing MD and she has not followed up w/ him. She is agreeable and will call back if we can be of further assistance.

## 2015-10-27 NOTE — Telephone Encounter (Signed)
Pt states she had open heart surgery last month, and states she has had pain in her neck and not sure if this is a problem or not.  States when she takes a deep breath, she has a slight discomfort in her chest. Please call.

## 2015-10-31 ENCOUNTER — Ambulatory Visit (INDEPENDENT_AMBULATORY_CARE_PROVIDER_SITE_OTHER): Payer: Self-pay | Admitting: Physician Assistant

## 2015-10-31 ENCOUNTER — Ambulatory Visit
Admission: RE | Admit: 2015-10-31 | Discharge: 2015-10-31 | Disposition: A | Discharge status: Home / Self Care | Payer: PPO | Source: Ambulatory Visit | Attending: Thoracic Surgery (Cardiothoracic Vascular Surgery) | Admitting: Thoracic Surgery (Cardiothoracic Vascular Surgery)

## 2015-10-31 VITALS — BP 138/84 | HR 62 | Resp 16 | Ht 62.0 in | Wt 157.0 lb

## 2015-10-31 DIAGNOSIS — Z952 Presence of prosthetic heart valve: Secondary | ICD-10-CM | POA: Diagnosis not present

## 2015-10-31 DIAGNOSIS — Z954 Presence of other heart-valve replacement: Secondary | ICD-10-CM

## 2015-10-31 DIAGNOSIS — I350 Nonrheumatic aortic (valve) stenosis: Secondary | ICD-10-CM

## 2015-10-31 NOTE — Progress Notes (Signed)
HPI:  Patient returns for routine postoperative follow-up having undergone AVR with a 23 mm Edwards Intuity Elite Pericardial Tissue Valve.  The patient's early postoperative recovery while in the hospital was notable for Atrial Fibrillation which was treated with IV Amiodarone with successful conversion to NSR.  She was also placed on Coumadin at discharge with most recent INR being 2.4. Since hospital discharge the patient reports that she does not feel like she is progressing as much as she should.  She is not able to ambulate very far and she becomes unsteady on her feet.  However, she only ambulates once a day and overall is not being very active.  She has participated with home PT but states they have not evaluated her since last Tuesday.  She denies chest pain and shortness of breath.   Current Outpatient Prescriptions  Medication Sig Dispense Refill  . acetaminophen (TYLENOL) 325 MG tablet Take 650 mg by mouth every 6 (six) hours as needed.    Marland Kitchen amiodarone (PACERONE) 200 MG tablet Take 1 tablet (200 mg total) by mouth daily. 30 tablet 1  . aspirin 81 MG tablet Take 81 mg by mouth daily.    . Calcium Carbonate-Vitamin D (CALCIUM 600 + D PO) Take by mouth 2 (two) times daily.    Marland Kitchen ezetimibe (ZETIA) 10 MG tablet Take 10 mg by mouth daily.    . fluticasone (FLONASE) 50 MCG/ACT nasal spray Place 2 sprays into the nose daily as needed.    . metoprolol tartrate (LOPRESSOR) 25 MG tablet Take 1 tablet (25 mg total) by mouth 2 (two) times daily. 60 tablet 1  . Multiple Vitamin (MULTIVITAMIN) tablet Take 1 tablet by mouth daily.    . NON FORMULARY at bedtime. CPAP    . pantoprazole (PROTONIX) 40 MG tablet Take 40 mg by mouth daily.     Marland Kitchen PARoxetine (PAXIL) 20 MG tablet Take 20 mg by mouth daily.    Marland Kitchen warfarin (COUMADIN) 2.5 MG tablet Take 1 tablet (2.5 mg total) by mouth daily at 6 PM. Or as directed by Dr. [name] 30 tablet 1  . traMADol (ULTRAM) 50 MG tablet Take 1 tablet (50 mg total) by mouth  every 6 (six) hours as needed for moderate pain. (Patient not taking: Reported on 10/31/2015) 30 tablet 0   No current facility-administered medications for this visit.    Physical Exam:  BP 138/84 mmHg  Pulse 62  Resp 16  Ht 5\' 2"  (1.575 m)  Wt 157 lb (71.215 kg)  BMI 28.71 kg/m2  SpO2 96%  Gen: no apparent distress Heart: RRR Lungs: CTA bilaterally Ext: no edema  Incisions: well healed  Diagnostic Tests:  CXR:  No pleural effusion, wires intact, AVR visualized, normal post operative changes  A/P:  1. S/P AVR- patient is doing well for the most part.  She is not ambulating as much as she should be.  She is only ambulating once a day which I explained to the patient is not adequate to recover fully from surgery.  I spoke with the patient and her family at length on the importance of walking 3 times per day.  I instructed her to walk as far as she could each time and then when she gets fatigued to rest and try again later.  She was agreeable to this.  I also gave her a prescription for outpatient PT 3x/week to assist with her deconditioning.  2. F/U with Cardiology as scheduled at the end of the month.  I  dont think she is ready for cardiac rehab yet as she remains deconditioned.  Hopefully she can improve over the next several weeks and begin this in February 3. RTC in 1 month to assess her deconditioned status.  She was instructed to contact our office sooner should she feel that she is not making much progress with the increased walking regimen and PT.  Ellwood Handler, PA-C [facility] [phone]

## 2015-11-03 DIAGNOSIS — Z0000 Encounter for general adult medical examination without abnormal findings: Secondary | ICD-10-CM | POA: Diagnosis not present

## 2015-11-03 DIAGNOSIS — Z79899 Other long term (current) drug therapy: Secondary | ICD-10-CM | POA: Diagnosis not present

## 2015-11-03 DIAGNOSIS — E7800 Pure hypercholesterolemia, unspecified: Secondary | ICD-10-CM | POA: Diagnosis not present

## 2015-11-05 DIAGNOSIS — Z48812 Encounter for surgical aftercare following surgery on the circulatory system: Secondary | ICD-10-CM | POA: Diagnosis not present

## 2015-11-05 DIAGNOSIS — R011 Cardiac murmur, unspecified: Secondary | ICD-10-CM | POA: Diagnosis not present

## 2015-11-05 DIAGNOSIS — I5032 Chronic diastolic (congestive) heart failure: Secondary | ICD-10-CM | POA: Diagnosis not present

## 2015-11-05 DIAGNOSIS — I471 Supraventricular tachycardia: Secondary | ICD-10-CM | POA: Diagnosis not present

## 2015-11-05 DIAGNOSIS — F329 Major depressive disorder, single episode, unspecified: Secondary | ICD-10-CM | POA: Diagnosis not present

## 2015-11-05 DIAGNOSIS — E785 Hyperlipidemia, unspecified: Secondary | ICD-10-CM | POA: Diagnosis not present

## 2015-11-05 DIAGNOSIS — Z79891 Long term (current) use of opiate analgesic: Secondary | ICD-10-CM | POA: Diagnosis not present

## 2015-11-05 DIAGNOSIS — Z8673 Personal history of transient ischemic attack (TIA), and cerebral infarction without residual deficits: Secondary | ICD-10-CM | POA: Diagnosis not present

## 2015-11-05 DIAGNOSIS — Z952 Presence of prosthetic heart valve: Secondary | ICD-10-CM | POA: Diagnosis not present

## 2015-11-05 DIAGNOSIS — Z96653 Presence of artificial knee joint, bilateral: Secondary | ICD-10-CM | POA: Diagnosis not present

## 2015-11-05 DIAGNOSIS — Z7901 Long term (current) use of anticoagulants: Secondary | ICD-10-CM | POA: Diagnosis not present

## 2015-11-05 DIAGNOSIS — M1990 Unspecified osteoarthritis, unspecified site: Secondary | ICD-10-CM | POA: Diagnosis not present

## 2015-11-05 DIAGNOSIS — Z5181 Encounter for therapeutic drug level monitoring: Secondary | ICD-10-CM | POA: Diagnosis not present

## 2015-11-05 LAB — POCT INR: INR: 2.1

## 2015-11-07 ENCOUNTER — Other Ambulatory Visit: Payer: Self-pay | Admitting: Physician Assistant

## 2015-11-07 ENCOUNTER — Other Ambulatory Visit: Payer: Self-pay | Admitting: Cardiovascular Disease

## 2015-11-07 ENCOUNTER — Ambulatory Visit (INDEPENDENT_AMBULATORY_CARE_PROVIDER_SITE_OTHER): Payer: PPO | Admitting: Cardiovascular Disease

## 2015-11-07 DIAGNOSIS — Z954 Presence of other heart-valve replacement: Secondary | ICD-10-CM

## 2015-11-07 DIAGNOSIS — Z953 Presence of xenogenic heart valve: Secondary | ICD-10-CM

## 2015-11-07 DIAGNOSIS — I350 Nonrheumatic aortic (valve) stenosis: Secondary | ICD-10-CM

## 2015-11-07 DIAGNOSIS — Z5181 Encounter for therapeutic drug level monitoring: Secondary | ICD-10-CM

## 2015-11-07 DIAGNOSIS — I6310 Cerebral infarction due to embolism of unspecified precerebral artery: Secondary | ICD-10-CM

## 2015-11-07 NOTE — Telephone Encounter (Signed)
Please review for refill, Thank you. 

## 2015-11-13 ENCOUNTER — Other Ambulatory Visit: Payer: Self-pay | Admitting: Physician Assistant

## 2015-11-14 ENCOUNTER — Other Ambulatory Visit: Payer: Self-pay | Admitting: Cardiovascular Disease

## 2015-11-14 ENCOUNTER — Ambulatory Visit (INDEPENDENT_AMBULATORY_CARE_PROVIDER_SITE_OTHER): Payer: PPO | Admitting: Cardiovascular Disease

## 2015-11-14 ENCOUNTER — Encounter: Payer: Self-pay | Admitting: Cardiovascular Disease

## 2015-11-14 VITALS — BP 130/82 | HR 64 | Ht 62.0 in | Wt 159.0 lb

## 2015-11-14 DIAGNOSIS — I5032 Chronic diastolic (congestive) heart failure: Secondary | ICD-10-CM | POA: Diagnosis not present

## 2015-11-14 DIAGNOSIS — I480 Paroxysmal atrial fibrillation: Secondary | ICD-10-CM | POA: Diagnosis not present

## 2015-11-14 DIAGNOSIS — I471 Supraventricular tachycardia: Secondary | ICD-10-CM

## 2015-11-14 DIAGNOSIS — Z953 Presence of xenogenic heart valve: Secondary | ICD-10-CM

## 2015-11-14 DIAGNOSIS — Z954 Presence of other heart-valve replacement: Secondary | ICD-10-CM

## 2015-11-14 NOTE — Assessment & Plan Note (Signed)
She denies any symptoms concerning for SVT Encouraged her to stay on her metoprolol   Total encounter time more than 25 minutes  Greater than 50% was spent in counseling and coordination of care with the patient

## 2015-11-14 NOTE — Assessment & Plan Note (Signed)
Postoperative atrial fibrillation, converting to normal sinus rhythm on amiodarone By clinical exam, remains in normal sinus rhythm We have discussed whether she should stay on warfarin given prior stroke ( etiology unclear), paroxysmal atrial fibrillation. We have discussed the risk and benefit with the patient and her husband. They're willing to stay on warfarin.

## 2015-11-14 NOTE — Progress Notes (Signed)
Patient ID: LAURITA RUSHER, female    DOB: 1936/01/02, 80 y.o.   MRN: [id number]  HPI Comments: Ms. Mahala is a very pleasant 80 year old woman with history of severeaortic valve stenosis, status post aVR with bioprosthetic valve 09/16/2015 Who presents for routine follow-up after valve surgery  Also with a history ofSVT, syncope, hyperlipidemia, iron deficiency anemia, peptic ulcer disease Status post left total knee replacement 11/15/2011 Notes provided indicate a history of SVT in February 2013 while in rehabilitation requiring adenosine, diltiazem and beta blockers, admitted with SVT, elevated troponin felt secondary to demand ischemia from tachycardia. She was started on diltiazem 120 mg daily February 2015 with TIA-type symptoms, workup in the hospital, continued on aspirin and Plavix. 30 day monitor did not show significant arrhythmia  In follow up today, she reports that she tolerated the surgery for a well Has not participated in cardiac rehabilitation yet, she has been delayed as daughter is in the ICU with pneumonia/sepsis It has been very stressful time for her  She had postoperative atrial fibrillation, started on amiodarone, warfarin The pars not had any problems with her anticoagulation  Denies any significant shortness of breath, no leg edema, no tachycardia concerning for recurrent atrial fibrillation She has declined EKG on today's visit  Echocardiogram after the surgery reviewed with the patient, showing well-seated aortic valve replacement  Previous echocardiogram showing Peak velocity 510 cm/s, mean gradient 54 mmHg, peak gradient 104 mmHg.   She had knee replacement surgery early 2016  Carotid ultrasound August 2012 showed no significant plaque  echocardiogram 01/ 8 2014 showed normal LV function, aortic valve velocity 315 cm/s, peak gradient 56 mm of mercury, mean gradient 19 mm of mercury, estimated aortic valve area 0.61 cm Echocardiogram in late 2014 showing  velocity of 400   Stress test August 2012 showed normal EKG with exertion, normal stress echo  Lab work may 2015 showing total cholesterol 200, LDL 111   Allergies  Allergen Reactions  . Codeine Hives  . Evista [Raloxifene]   . Statins Other (See Comments)    Leg muscle cramps  . Sulfa Antibiotics Hives    Current Outpatient Prescriptions on File Prior to Visit  Medication Sig Dispense Refill  . acetaminophen (TYLENOL) 325 MG tablet Take 650 mg by mouth every 6 (six) hours as needed.    Marland Kitchen amiodarone (PACERONE) 200 MG tablet Take 1 tablet (200 mg total) by mouth daily. 30 tablet 1  . Calcium Carbonate-Vitamin D (CALCIUM 600 + D PO) Take by mouth 2 (two) times daily.    Marland Kitchen ezetimibe (ZETIA) 10 MG tablet Take 10 mg by mouth daily.    . fluticasone (FLONASE) 50 MCG/ACT nasal spray Place 2 sprays into the nose daily as needed.    . metoprolol tartrate (LOPRESSOR) 25 MG tablet Take 1 tablet (25 mg total) by mouth 2 (two) times daily. 60 tablet 1  . Multiple Vitamin (MULTIVITAMIN) tablet Take 1 tablet by mouth daily.    . NON FORMULARY at bedtime. CPAP    . pantoprazole (PROTONIX) 40 MG tablet Take 40 mg by mouth daily.     Marland Kitchen PARoxetine (PAXIL) 20 MG tablet Take 20 mg by mouth daily.    . traMADol (ULTRAM) 50 MG tablet Take 1 tablet (50 mg total) by mouth every 6 (six) hours as needed for moderate pain. 30 tablet 0  . warfarin (COUMADIN) 2.5 MG tablet Take 1 tablet (2.5 mg total) by mouth daily at 6 PM. Or as directed by Dr. [name]  30 tablet 1  . warfarin (COUMADIN) 2.5 MG tablet TAKE 1 TABLET (2.5 MG TOTAL) BY MOUTH DAILY AT 6 PM. OR AS DIRECTED BY DR. [name] 45 tablet 3   No current facility-administered medications on file prior to visit.    Past Medical History  Diagnosis Date  . Obstructive sleep apnea   . Hyperlipidemia   . Depression   . Osteopenia   . Iron deficiency anemia   . Syncope and collapse   . Aortic stenosis   . Bilateral cataracts   . Diverticulosis   .  Osteoarthritis   . Gastroesophageal reflux disease   . Peptic ulcer disease   . Urinary tract infection   . Heart murmur   . Breast cancer ([place])   . Supraventricular tachycardia ([place])   . Stroke ([place])     11/14/13  . Chronic diastolic (congestive) heart failure ([place])   . Bleeding nose     Thurs night (09/08/15) and Fri (09/09/15) left side.Bleeding didn't last long  . S/P aortic valve replacement with bioprosthetic valve 09/16/2015    23 mm Edwards Intuity Elite bioprosthetic tissue valve    Past Surgical History  Procedure Laterality Date  . Knee arthroscopy      right  . Cholecystectomy    . Tonsillectomy    . Vaginal hysterectomy    . Cataract extraction, bilateral    . Knee arthroscopy      left  . Breast biopsy Right 1999    breast ca radation  . Cardiac catheterization N/A 08/17/2015    Procedure: Left Heart Cath and Coronary Angiography;  Surgeon: Minna Merritts, MD;  Location: [facility];  Service: Cardiovascular;  Laterality: N/A;  . Aortic valve replacement N/A 09/16/2015    Procedure: AORTIC VALVE REPLACEMENT (AVR);  Surgeon: Rexene Alberts, MD;  Location: [facility];  Service: Open Heart Surgery;  Laterality: N/A;  . Tee without cardioversion N/A 09/16/2015    Procedure: TRANSESOPHAGEAL ECHOCARDIOGRAM (TEE);  Surgeon: Rexene Alberts, MD;  Location: [facility];  Service: Open Heart Surgery;  Laterality: N/A;    Social History  reports that she has never smoked. She does not have any smokeless tobacco history on file. She reports that she drinks alcohol. She reports that she does not use illicit drugs.  Family History family history includes Heart failure in her mother.  Review of Systems  Constitutional: Negative.   Respiratory: Negative.   Cardiovascular: Negative.   Gastrointestinal: Negative.   Musculoskeletal: Positive for back pain and arthralgias.  Skin: Negative.   Neurological: Negative.   Hematological: Negative.   Psychiatric/Behavioral:  Negative.   All other systems reviewed and are negative.   BP 130/82 mmHg  Pulse 64  Ht 5\' 2"  (1.575 m)  Wt 159 lb (72.122 kg)  BMI 29.07 kg/m2  Physical Exam  Constitutional: She is oriented to person, place, and time. She appears well-developed and well-nourished.  HENT:  Head: Normocephalic.  Nose: Nose normal.  Mouth/Throat: Oropharynx is clear and moist.  Eyes: Conjunctivae are normal. Pupils are equal, round, and reactive to light.  Neck: Normal range of motion. Neck supple. No JVD present.  Cardiovascular: Normal rate, regular rhythm, S1 normal, S2 normal and intact distal pulses.  Exam reveals no gallop and no friction rub.   Murmur heard.  Crescendo systolic murmur is present with a grade of 3/6  Pulmonary/Chest: Effort normal and breath sounds normal. No respiratory distress. She has no wheezes. She has no rales. She exhibits no  tenderness.  Abdominal: Soft. Bowel sounds are normal. She exhibits no distension. There is no tenderness.  Musculoskeletal: Normal range of motion. She exhibits no edema or tenderness.  Lymphadenopathy:    She has no cervical adenopathy.  Neurological: She is alert and oriented to person, place, and time. Coordination normal.  Skin: Skin is warm and dry. No rash noted. No erythema.  Psychiatric: She has a normal mood and affect. Her behavior is normal. Judgment and thought content normal.    Assessment and Plan  Nursing note and vitals reviewed.      since

## 2015-11-14 NOTE — Assessment & Plan Note (Signed)
Hospital records reviewed in detail, recovered well from her AVR Has not started cardiac rehabilitation, recommended she do this depending on an she has time given her daughter is ill in the ICU

## 2015-11-14 NOTE — Patient Instructions (Signed)
You are doing well. No medication changes were made.  Please call us if you have new issues that need to be addressed before your next appt.  Your physician wants you to follow-up in: 6 months.  You will receive a reminder letter in the mail two months in advance. If you don't receive a letter, please call our office to schedule the follow-up appointment.   

## 2015-11-14 NOTE — Assessment & Plan Note (Signed)
Currently not on diuretics, appears to relatively euvolemic on exam No changes to her medications

## 2015-11-16 ENCOUNTER — Ambulatory Visit: Payer: PPO

## 2015-11-28 ENCOUNTER — Ambulatory Visit: Payer: Self-pay | Admitting: Thoracic Surgery (Cardiothoracic Vascular Surgery)

## 2015-11-30 ENCOUNTER — Ambulatory Visit (INDEPENDENT_AMBULATORY_CARE_PROVIDER_SITE_OTHER): Payer: PPO

## 2015-11-30 DIAGNOSIS — Z5181 Encounter for therapeutic drug level monitoring: Secondary | ICD-10-CM | POA: Diagnosis not present

## 2015-11-30 DIAGNOSIS — Z954 Presence of other heart-valve replacement: Secondary | ICD-10-CM

## 2015-11-30 DIAGNOSIS — I6310 Cerebral infarction due to embolism of unspecified precerebral artery: Secondary | ICD-10-CM | POA: Diagnosis not present

## 2015-11-30 DIAGNOSIS — Z953 Presence of xenogenic heart valve: Secondary | ICD-10-CM

## 2015-11-30 DIAGNOSIS — I350 Nonrheumatic aortic (valve) stenosis: Secondary | ICD-10-CM

## 2015-11-30 LAB — POCT INR: INR: 2.4

## 2015-12-14 ENCOUNTER — Ambulatory Visit: Payer: PPO

## 2015-12-19 ENCOUNTER — Other Ambulatory Visit: Payer: Self-pay | Admitting: *Deleted

## 2015-12-19 ENCOUNTER — Encounter: Payer: Self-pay | Admitting: *Deleted

## 2015-12-19 NOTE — Patient Outreach (Addendum)
[facility]) Care Management  12/19/2015  JOHNICE NUFFER [birth year] [id number]   Subjective: Telephone call to patient's home number, spoke with patient and HIPAA verified.   Discussed [facility] Care Management services.   Patient declined services due to having no care management needs at this time.    States she has also had home health in the past and voiced understanding that [place] [facility] is not home health.   Patient in agreement with receiving information on [facility] Care Management.   Patient states she will give [facility] a call in the future if services needed.   Patient states she does not have any transportation, community resource needs or questions for [place].  Objective: Per Epic case review: Patient has a history of congestive heart failure.  Assessment: Received HTA Tier 4 list referral on 12/05/15.   0 Admissions and 2 ER visits.   Diagnosis: Congestive Heart Failure.    Plan: RNCM will send patient's primary MD case closure letter due to refusal/ no care management needs. RNCM will send request for case closure due to refusal/ no care management needs to [place] at [place] [facility]. RNCM will send patient successful outreach letter, magnet, and pamphlet.    Seif Teichert H. Annia Friendly, BSN, [place] [facility] Telephonic CM Phone: [phone] Fax: [phone]

## 2015-12-20 ENCOUNTER — Encounter: Payer: Self-pay | Admitting: *Deleted

## 2015-12-28 ENCOUNTER — Ambulatory Visit (INDEPENDENT_AMBULATORY_CARE_PROVIDER_SITE_OTHER): Payer: PPO

## 2015-12-28 DIAGNOSIS — I350 Nonrheumatic aortic (valve) stenosis: Secondary | ICD-10-CM

## 2015-12-28 DIAGNOSIS — Z5181 Encounter for therapeutic drug level monitoring: Secondary | ICD-10-CM | POA: Diagnosis not present

## 2015-12-28 DIAGNOSIS — Z954 Presence of other heart-valve replacement: Secondary | ICD-10-CM | POA: Diagnosis not present

## 2015-12-28 DIAGNOSIS — Z953 Presence of xenogenic heart valve: Secondary | ICD-10-CM

## 2015-12-28 DIAGNOSIS — N3000 Acute cystitis without hematuria: Secondary | ICD-10-CM | POA: Diagnosis not present

## 2015-12-28 DIAGNOSIS — R358 Other polyuria: Secondary | ICD-10-CM | POA: Diagnosis not present

## 2015-12-28 DIAGNOSIS — I6310 Cerebral infarction due to embolism of unspecified precerebral artery: Secondary | ICD-10-CM

## 2015-12-28 LAB — POCT INR: INR: 2

## 2016-01-09 ENCOUNTER — Ambulatory Visit (INDEPENDENT_AMBULATORY_CARE_PROVIDER_SITE_OTHER): Payer: PPO | Admitting: Thoracic Surgery (Cardiothoracic Vascular Surgery)

## 2016-01-09 ENCOUNTER — Encounter: Payer: Self-pay | Admitting: Thoracic Surgery (Cardiothoracic Vascular Surgery)

## 2016-01-09 VITALS — BP 116/76 | HR 65 | Resp 20 | Ht 62.0 in | Wt 158.0 lb

## 2016-01-09 DIAGNOSIS — I5032 Chronic diastolic (congestive) heart failure: Secondary | ICD-10-CM

## 2016-01-09 DIAGNOSIS — I471 Supraventricular tachycardia: Secondary | ICD-10-CM | POA: Diagnosis not present

## 2016-01-09 DIAGNOSIS — Z952 Presence of prosthetic heart valve: Secondary | ICD-10-CM

## 2016-01-09 DIAGNOSIS — I350 Nonrheumatic aortic (valve) stenosis: Secondary | ICD-10-CM | POA: Diagnosis not present

## 2016-01-09 DIAGNOSIS — G4733 Obstructive sleep apnea (adult) (pediatric): Secondary | ICD-10-CM | POA: Diagnosis not present

## 2016-01-09 DIAGNOSIS — Z954 Presence of other heart-valve replacement: Secondary | ICD-10-CM

## 2016-01-09 DIAGNOSIS — Z953 Presence of xenogenic heart valve: Secondary | ICD-10-CM

## 2016-01-09 DIAGNOSIS — R269 Unspecified abnormalities of gait and mobility: Secondary | ICD-10-CM | POA: Diagnosis not present

## 2016-01-09 NOTE — Progress Notes (Signed)
[address]       [place] [address]             [phone]     CARDIOTHORACIC SURGERY OFFICE NOTE  Referring Provider is [name], [name] November, MD PCP is [name], Kayla Bis, MD   HPI:  Patient is a 80 year old female who returns to the office today for routine follow-up more than 3 months status post aortic valve replacement using a 23 mm rapid deployment [employer] Elite bovine pericardial tissue valve on 09/16/2015.  Her early postoperative recovery in the hospital was notable for paroxysmal atrial fibrillation for which she was treated with amiodarone and started on anticoagulation using warfarin. She spontaneously converted to sinus rhythm and was discharged from the hospital on the sixth postoperative day. She was last seen here in our office on 10/31/2015 and she has not had any clinical recurrence of atrial fibrillation since hospital discharge.  Her Coumadin dosing has been managed through Dr. Donivan Scull office.  She returns to our office for routine follow-up today. The patient states that she is doing very well. She has minimal residual soreness in her chest. She has not had any palpitations or dizzy spells to suggest a recurrence of atrial fibrillation. Her breathing is quite good. Unfortunately, she has still not started the cardiac rehabilitation program because her daughter has been ill in the hospital for an extended period of time.  She gets tired with activity but she denies significant shortness of breath. Overall she feels that she is getting along fairly well, although her daughter's illness has been stressful.   Current Outpatient Prescriptions  Medication Sig Dispense Refill  . acetaminophen (TYLENOL) 325 MG tablet Take 650 mg by mouth every 6 (six) hours as needed.    Marland Kitchen amiodarone (PACERONE) 200 MG tablet TAKE 1 TABLET EVERY DAY 30 tablet 3  . Calcium Carbonate-Vitamin D (CALCIUM 600 + D PO) Take by mouth 2 (two) times daily.    Marland Kitchen ezetimibe (ZETIA)  10 MG tablet Take 10 mg by mouth daily.    . fluticasone (FLONASE) 50 MCG/ACT nasal spray Place 2 sprays into the nose daily as needed.    . metoprolol tartrate (LOPRESSOR) 25 MG tablet TAKE 1 TABLET TWICE DAILY 60 tablet 3  . Multiple Vitamin (MULTIVITAMIN) tablet Take 1 tablet by mouth daily.    . NON FORMULARY at bedtime. CPAP    . pantoprazole (PROTONIX) 40 MG tablet Take 40 mg by mouth daily.     Marland Kitchen PARoxetine (PAXIL) 20 MG tablet Take 20 mg by mouth daily.    Marland Kitchen warfarin (COUMADIN) 2.5 MG tablet Take 1 tablet (2.5 mg total) by mouth daily at 6 PM. Or as directed by Dr. [name] 30 tablet 1  . warfarin (COUMADIN) 2.5 MG tablet TAKE 1 TABLET (2.5 MG TOTAL) BY MOUTH DAILY AT 6 PM. OR AS DIRECTED BY DR. [name] 45 tablet 3   No current facility-administered medications for this visit.      Physical Exam:   BP 116/76 mmHg  Pulse 65  Resp 20  Ht 5\' 2"  (1.575 m)  Wt 158 lb (71.668 kg)  BMI 28.89 kg/m2  SpO2 95%  General:  Well-appearing  Chest:   Clear to auscultation  CV:   Regular rate and rhythm without murmur  Incisions:  Well healed, sternum is stable  Abdomen:  Soft nontender  Extremities:  Warm and well-perfused  Diagnostic Tests:  Transthoracic Echocardiography  (Report amended )  Patient: Kayla Gray,  Kayla Gray MR #: [id number] Study Date: 09/21/2015 Gender: F Age: 41 Height: 157.5 cm Weight: 79.8 kg BSA: 1.9 m^2 Pt. Status: Room: [id number]  ADMITTING Darylene Price, M.D. ATTENDING Darylene Price, M.D. ORDERING Darylene Price, M.D. REFERRING Darylene Price, M.D. PERFORMING [facility], Inpatient SONOGRAPHER Roseanna Rainbow  cc:  ------------------------------------------------------------------- LV EF: 55% - 60%  ------------------------------------------------------------------- Indications: 424.1 Aortic valve disorders.  ------------------------------------------------------------------- History:  PMH: Patient is S/P AOV replacement. SVT. Syncope. Angina pectoris. Congestive heart failure.  ------------------------------------------------------------------- Study Conclusions  - Left ventricle: The cavity size was normal. There was moderate  concentric hypertrophy. Systolic function was normal. The  estimated ejection fraction was in the range of 55% to 60%. Wall  motion was normal; there were no regional wall motion  abnormalities. Doppler parameters are consistent with abnormal  left ventricular relaxation (grade 1 diastolic dysfunction). - Aortic valve: A bioprosthesis was present and functioning  normally. 23 mm [employer] Elite Pericardial Tissue Valve.  Peak velocity (S): 104.05 cm/s. - Mitral valve: There was mild regurgitation. - Right atrium: The atrium was mildly dilated. - Tricuspid valve: There was mild regurgitation.  Transthoracic echocardiography. M-mode, complete 2D, spectral Doppler, and color Doppler. Birthdate: Patient birthdate: 1936/02/26. Age: Patient is 80 yr old. Sex: Gender: female. BMI: 32.2 kg/m^2. Blood pressure: 142/70 Patient status: Inpatient. Study date: Study date: 09/21/2015. Study time: 11:04 AM. Location: Bedside.  -------------------------------------------------------------------  ------------------------------------------------------------------- Left ventricle: The cavity size was normal. There was moderate concentric hypertrophy. Systolic function was normal. The estimated ejection fraction was in the range of 55% to 60%. Wall motion was normal; there were no regional wall motion abnormalities. Doppler parameters are consistent with abnormal left ventricular relaxation (grade 1 diastolic dysfunction).  ------------------------------------------------------------------- Aortic valve: A bioprosthesis was present and functioning normally. 23 mm [employer] Intuity Elite Pericardial Tissue  Valve. Doppler: Transvalvular velocity was within the normal range. There was no stenosis. There was no regurgitation. Peak velocity ratio of LVOT to aortic valve: 0.89. Valve area (Vmax): 2.8 cm^2. Indexed valve area (Vmax): 1.47 cm^2/m^2. Peak gradient (S): 4 mm Hg.  ------------------------------------------------------------------- Aorta: Aortic root: The aortic root was normal in size.  ------------------------------------------------------------------- Mitral valve: Structurally normal valve. Mobility was not restricted. Doppler: Transvalvular velocity was within the normal range. There was no evidence for stenosis. There was mild regurgitation. Peak gradient (D): 3 mm Hg.  ------------------------------------------------------------------- Left atrium: The atrium was normal in size.  ------------------------------------------------------------------- Right ventricle: The cavity size was normal. Wall thickness was normal. Systolic function was normal.  ------------------------------------------------------------------- Pulmonic valve: Poorly visualized. Structurally normal valve. Cusp separation was normal. Doppler: Transvalvular velocity was within the normal range. There was no evidence for stenosis. There was no regurgitation.  ------------------------------------------------------------------- Tricuspid valve: Structurally normal valve. Doppler: Transvalvular velocity was within the normal range. There was mild regurgitation.  ------------------------------------------------------------------- Pulmonary artery: The main pulmonary artery was normal-sized. Systolic pressure was within the normal range.  ------------------------------------------------------------------- Right atrium: The atrium was mildly dilated.  ------------------------------------------------------------------- Pericardium: A prominent pericardial fat pad  was present. There was no pericardial effusion.  ------------------------------------------------------------------- Systemic veins: Inferior vena cava: The vessel was normal in size.  ------------------------------------------------------------------- Measurements  Left ventricle Value Reference LV ID, ED, PLAX chordal (L) 33.5 mm 43 - 52 LV ID, ES, PLAX chordal 26.5 mm 23 - 38 LV fx shortening, PLAX chordal (L) 21 % >=29 LV PW thickness, ED 13.7 mm --------- IVS/LV PW ratio, ED 1.15 <=1.3 Stroke volume, 2D 65 ml --------- Stroke volume/bsa, 2D 34 ml/m^2 --------- LV ejection fraction, 1-p A4C  69 % --------- LV end-diastolic volume, 2-p 57 ml --------- LV end-systolic volume, 2-p 22 ml --------- LV ejection fraction, 2-p 61 % --------- Stroke volume, 2-p 35 ml --------- LV end-diastolic volume/bsa, 2-p 30 ml/m^2 --------- LV end-systolic volume/bsa, 2-p 12 ml/m^2 --------- Stroke volume/bsa, 2-p 18.5 ml/m^2 --------- LV e&', lateral 11 cm/s --------- LV E/e&', lateral 8.29 --------- LV e&', medial 6.42 cm/s --------- LV E/e&', medial 14.21 --------- LV e&', average 8.71 cm/s --------- LV E/e&', average 10.47 ---------  Ventricular septum Value Reference IVS thickness, ED 15.8 mm  ---------  LVOT Value Reference LVOT ID, S 20 mm --------- LVOT area 3.14 cm^2 --------- LVOT peak velocity, S 92.4 cm/s --------- LVOT mean velocity, S 61.2 cm/s --------- LVOT VTI, S 20.7 cm --------- LVOT peak gradient, S 3 mm Hg ---------  Aortic valve Value Reference Aortic valve peak velocity, S 104.05 cm/s --------- Aortic peak gradient, S 4 mm Hg --------- Velocity ratio, peak, LVOT/AV 0.89 --------- Aortic valve area, peak velocity 2.8 cm^2 --------- Aortic valve area/bsa, peak 1.47 cm^2/m^2 --------- velocity  Aorta Value Reference Aortic root ID, ED 32 mm ---------  Left atrium Value Reference LA ID, A-P, ES 36 mm --------- LA ID/bsa, A-P 1.89 cm/m^2 <=2.2 LA volume, S 60.6 ml --------- LA volume/bsa, S 31.9 ml/m^2 --------- LA volume, ES, 1-p A4C 48.6 ml --------- LA volume/bsa, ES, 1-p A4C 25.6 ml/m^2 --------- LA volume, ES, 1-p A2C 73 ml --------- LA volume/bsa, ES, 1-p A2C 38.4 ml/m^2 ---------  Mitral valve Value Reference Mitral E-wave peak velocity 91.2 cm/s --------- Mitral A-wave peak velocity 94.6 cm/s --------- Mitral deceleration time (H) 243  ms 150 - 230 Mitral peak gradient, D 3 mm Hg --------- Mitral E/A ratio, peak 1 ---------  Pulmonary arteries Value Reference PA pressure, S, DP 26 mm Hg <=30  Tricuspid valve Value Reference Tricuspid regurg peak velocity 241 cm/s --------- Tricuspid peak RV-RA gradient 23 mm Hg ---------  Systemic veins Value Reference Estimated CVP 3 mm Hg ---------  Right ventricle Value Reference TAPSE 14.1 mm --------- RV pressure, S, DP 26 mm Hg <=30 RV s&', lateral, S 7.62 cm/s ---------  Legend: (L) and (H) mark values outside specified reference range.  ------------------------------------------------------------------- Lovenia Kim, M.D. 2016-12-09T13:15:42   Impression:  Patient is doing well more than 3 months status post aortic valve replacement using a rapid deployment bioprosthetic tissue valve. Follow-up echocardiogram performed while the patient remained in the hospital looked quite good with no paravalvular leak and low transvalvular gradient. Clinically the patient is doing very well and she has not had a recurrence of atrial fibrillation since hospital discharge.  Plan:  I've instructed the patient to stop taking amiodarone. She will let Dr. Donivan Scull office and the Coumadin Clinic know about this change so that they will take that into account with her coumadin dosing.  I think it would be reasonable to consider stopping coumadin, but we will defer this decision to Dr. Donivan Scull discretion.  I have encouraged the patient to enroll and participate in  the outpatient cardiac rehabilitation program.  The patient has been reminded regarding the importance of dental hygiene and the lifelong need for antibiotic prophylaxis for all dental cleanings and other related invasive procedures.  She will return for routine follow-up next December, approximately 1 year following her surgery. She will call and return sooner should specific problems or difficulties arise.   I spent in excess of 15 minutes during the conduct of this office consultation and >50% of this time  involved direct face-to-face encounter with the patient for counseling and/or coordination of their care.   Valentina Gu. Roxy Manns, MD 01/09/2016 3:24 PM

## 2016-01-09 NOTE — Patient Instructions (Addendum)
Stop taking amiodarone and let the coumadin clinic know that you have stopped taking it  You are encouraged to enroll and participate in the outpatient cardiac rehab program beginning as soon as practical.  Endocarditis is a potentially serious infection of heart valves or inside lining of the heart.  It occurs more commonly in patients with diseased heart valves (such as patient's with aortic or mitral valve disease) and in patients who have undergone heart valve repair or replacement.  Certain surgical and dental procedures may put you at risk, such as dental cleaning, other dental procedures, or any surgery involving the respiratory, urinary, gastrointestinal tract, gallbladder or prostate gland.   To minimize your chances for develooping endocarditis, maintain good oral health and seek prompt medical attention for any infections involving the mouth, teeth, gums, skin or urinary tract.    Always notify your doctor or dentist about your underlying heart valve condition before having any invasive procedures. You will need to take antibiotics before certain procedures, including all routine dental cleanings or other dental procedures.  Your cardiologist or dentist should prescribe these antibiotics for you to be taken ahead of time.

## 2016-01-12 ENCOUNTER — Telehealth: Payer: Self-pay | Admitting: Cardiovascular Disease

## 2016-01-12 NOTE — Telephone Encounter (Signed)
Pt called stating that Dr Roxy Manns told her to call us for she is off [place] and this may effect her warfirn  Please call back

## 2016-01-13 NOTE — Telephone Encounter (Signed)
Spoke with pt Pacerone discontinued by Dr Roxy Manns on 01/09/16.  Advised pt this medication does interact with Coumadin and we will need to see pt sooner than 02/07/16 appt in Coumadin clinic to adjust dosage of Coumadin as needed secondary to stopping the Pacerone.  Rescheduled follow-up appt to 01/18/16 in [facility] at 3:30pm after cardiac rehab.

## 2016-01-18 ENCOUNTER — Ambulatory Visit (INDEPENDENT_AMBULATORY_CARE_PROVIDER_SITE_OTHER): Payer: PPO

## 2016-01-18 ENCOUNTER — Encounter: Payer: PPO | Attending: Cardiovascular Disease | Admitting: *Deleted

## 2016-01-18 VITALS — Ht 62.0 in | Wt 163.8 lb

## 2016-01-18 DIAGNOSIS — Z952 Presence of prosthetic heart valve: Secondary | ICD-10-CM | POA: Diagnosis not present

## 2016-01-18 DIAGNOSIS — I6310 Cerebral infarction due to embolism of unspecified precerebral artery: Secondary | ICD-10-CM

## 2016-01-18 DIAGNOSIS — Z953 Presence of xenogenic heart valve: Secondary | ICD-10-CM

## 2016-01-18 DIAGNOSIS — Z5181 Encounter for therapeutic drug level monitoring: Secondary | ICD-10-CM

## 2016-01-18 DIAGNOSIS — I350 Nonrheumatic aortic (valve) stenosis: Secondary | ICD-10-CM

## 2016-01-18 DIAGNOSIS — Z954 Presence of other heart-valve replacement: Secondary | ICD-10-CM | POA: Diagnosis not present

## 2016-01-18 LAB — POCT INR: INR: 2.2

## 2016-01-18 NOTE — Patient Instructions (Signed)
Patient Instructions  Patient Details  Name: Kayla Gray MRN: [id number] Date of Birth: 1935/10/18 Referring Provider:  Minna Merritts, MD  Below are the personal goals you chose as well as exercise and nutrition goals. Our goal is to help you keep on track towards obtaining and maintaining your goals. We will be discussing your progress on these goals with you throughout the program.  Initial Exercise Prescription:     Initial Exercise Prescription - 01/18/16 1600    Date of Initial Exercise Prescription   Date 01/18/16   Treadmill   MPH 1.5   Grade 0   Minutes 15   Recumbant Bike   Level 2   RPM 40   Watts 20   Minutes 15   NuStep   Level 2   Watts 40   Minutes 15   Recumbant Elliptical   Level 2   RPM 40   Watts 20   Minutes 15   REL-XR   Level 2   Watts 50   Minutes 15   Biostep-RELP   Level 1   Watts 15   Minutes 15   Prescription Details   Frequency (times per week) 2-3   Duration Progress to 45 minutes of aerobic exercise without signs/symptoms of physical distress   Intensity   THRR REST +  30   Ratings of Perceived Exertion 11-15   Progression   Progression Continue to progress workloads to maintain intensity without signs/symptoms of physical distress.   Resistance Training   Training Prescription Yes   Weight 2   Reps 10-15      Exercise Goals: Frequency: Be able to perform aerobic exercise three times per week working toward 3-5 days per week.  Intensity: Work with a perceived exertion of 11 (fairly light) - 15 (hard) as tolerated. Follow your new exercise prescription and watch for changes in prescription as you progress with the program. Changes will be reviewed with you when they are made.  Duration: You should be able to do 30 minutes of continuous aerobic exercise in addition to a 5 minute warm-up and a 5 minute cool-down routine.  Nutrition Goals: Your personal nutrition goals will be established when you do your nutrition  analysis with the dietician.  The following are nutrition guidelines to follow: Cholesterol < 200mg /day Sodium < 1500mg /day Fiber: Women over 50 yrs - 21 grams per day  Personal Goals:     Personal Goals and Risk Factors at Admission - 01/18/16 1529    Core Components/Risk Factors/Patient Goals on Admission   Sedentary Yes   Intervention Provide advice, education, support and counseling about physical activity/exercise needs.;Develop an individualized exercise prescription for aerobic and resistive training based on initial evaluation findings, risk stratification, comorbidities and participant's personal goals.   Expected Outcomes Achievement of increased cardiorespiratory fitness and enhanced flexibility, muscular endurance and strength shown through measurements of functional capacity and personal statement of participant.   Increase Strength and Stamina Yes   Intervention Provide advice, education, support and counseling about physical activity/exercise needs.;Develop an individualized exercise prescription for aerobic and resistive training based on initial evaluation findings, risk stratification, comorbidities and participant's personal goals.   Expected Outcomes Achievement of increased cardiorespiratory fitness and enhanced flexibility, muscular endurance and strength shown through measurements of functional capacity and personal statement of participant.   Hypertension Yes   Intervention Provide education on lifestyle modifcations including regular physical activity/exercise, weight management, moderate sodium restriction and increased consumption of fresh fruit, vegetables, and low fat dairy,  alcohol moderation, and smoking cessation.;Monitor prescription use compliance.   Expected Outcomes Short Term: Continued assessment and intervention until BP is < 140/63mm HG in hypertensive participants. < 130/65mm HG in hypertensive participants with diabetes, heart failure or chronic kidney  disease.;Long Term: Maintenance of blood pressure at goal levels.   Lipids Yes   Intervention Provide education and support for participant on nutrition & aerobic/resistive exercise along with prescribed medications to achieve LDL 70mg , HDL >40mg .   Expected Outcomes Short Term: Participant states understanding of desired cholesterol values and is compliant with medications prescribed. Participant is following exercise prescription and nutrition guidelines.;Long Term: Cholesterol controlled with medications as prescribed, with individualized exercise RX and with personalized nutrition plan. Value goals: LDL < 70mg , HDL > 40 mg.      Tobacco Use Initial Evaluation: History  Smoking status  . Never Smoker   Smokeless tobacco  . Not on file    Copy of goals given to participant.

## 2016-01-18 NOTE — Addendum Note (Signed)
Addended by: Felipe Drone on: 01/18/2016 06:29 PM   Modules accepted: Orders

## 2016-01-18 NOTE — Progress Notes (Signed)
Cardiac Individual Treatment Plan  Patient Details  Name: Kayla Gray MRN: [id number] Date of Birth: 1935/11/12 Referring Provider:  Minna Merritts, MD  Initial Encounter Date:       Cardiac Rehab from 01/18/2016 in [facility] Cardiac and Pulmonary Rehab   Date  01/18/16      Visit Diagnosis: S/P AVR (aortic valve replacement)  Patient's Home Medications on Admission:  Current outpatient prescriptions:  .  acetaminophen (TYLENOL) 325 MG tablet, Take 650 mg by mouth every 6 (six) hours as needed., Disp: , Rfl:  .  Calcium Carbonate-Vitamin D (CALCIUM 600 + D PO), Take by mouth 2 (two) times daily., Disp: , Rfl:  .  ezetimibe (ZETIA) 10 MG tablet, Take 10 mg by mouth daily., Disp: , Rfl:  .  fluticasone (FLONASE) 50 MCG/ACT nasal spray, Place 2 sprays into the nose daily as needed., Disp: , Rfl:  .  metoprolol tartrate (LOPRESSOR) 25 MG tablet, TAKE 1 TABLET TWICE DAILY, Disp: 60 tablet, Rfl: 3 .  Multiple Vitamin (MULTIVITAMIN) tablet, Take 1 tablet by mouth daily., Disp: , Rfl:  .  NON FORMULARY, at bedtime. CPAP, Disp: , Rfl:  .  pantoprazole (PROTONIX) 40 MG tablet, Take 40 mg by mouth daily. , Disp: , Rfl:  .  PARoxetine (PAXIL) 20 MG tablet, Take 20 mg by mouth daily., Disp: , Rfl:  .  warfarin (COUMADIN) 2.5 MG tablet, Take 2.5 mg by mouth daily. Take 1.5 tabs every day except Sunday and Friday.  On Sunday and Friday take 1 tablet only.  Otherwise follow instructions by Dr. [name]., Disp: , Rfl:  .  warfarin (COUMADIN) 2.5 MG tablet, Take 1 tablet (2.5 mg total) by mouth daily at 6 PM. Or as directed by Dr. [name], Disp: 30 tablet, Rfl: 1 .  warfarin (COUMADIN) 2.5 MG tablet, TAKE 1 TABLET (2.5 MG TOTAL) BY MOUTH DAILY AT 6 PM. OR AS DIRECTED BY DR. [name] (Patient not taking: Reported on 01/18/2016), Disp: 45 tablet, Rfl: 3  Past Medical History: Past Medical History  Diagnosis Date  . Obstructive sleep apnea   . Hyperlipidemia   . Depression   . Osteopenia   . Iron deficiency  anemia   . Syncope and collapse   . Aortic stenosis   . Bilateral cataracts   . Diverticulosis   . Osteoarthritis   . Gastroesophageal reflux disease   . Peptic ulcer disease   . Urinary tract infection   . Heart murmur   . Breast cancer ([place])   . Supraventricular tachycardia ([place])   . Stroke ([place])     11/14/13  . Chronic diastolic (congestive) heart failure ([place])   . Bleeding nose     Thurs night (09/08/15) and Fri (09/09/15) left side.Bleeding didn't last long  . S/P aortic valve replacement with bioprosthetic valve 09/16/2015    23 mm [employer] Elite bioprosthetic tissue valve    Tobacco Use: History  Smoking status  . Never Smoker   Smokeless tobacco  . Not on file    Labs: Recent Review Flowsheet Data    Labs for ITP Cardiac and Pulmonary Rehab Latest Ref Rng 09/16/2015 09/16/2015 09/16/2015 09/17/2015 09/17/2015   PHART 7.350 - 7.450 7.271(L) 7.333(L) 7.305(L) 7.369 -   PCO2ART 35.0 - 45.0 mmHg 46.6(H) 43.5 43.2 44.4 -   HCO3 20.0 - 24.0 mEq/L 21.4 23.0 21.3 25.4(H) -   TCO2 0 - 100 mmol/L 23 24 23 27 22    ACIDBASEDEF 0.0 - 2.0 mmol/L 5.0(H) 3.0(H) 5.0(H) - -  O2SAT - 92.0 94.0 95.0 95.0 -       Exercise Target Goals: Date: 01/18/16  Exercise Program Goal: Individual exercise prescription set with THRR, safety & activity barriers. Participant demonstrates ability to understand and report RPE using BORG scale, to self-measure pulse accurately, and to acknowledge the importance of the exercise prescription.  Exercise Prescription Goal: Starting with aerobic activity 30 plus minutes a day, 3 days per week for initial exercise prescription. Provide home exercise prescription and guidelines that participant acknowledges understanding prior to discharge.  Activity Barriers & Risk Stratification:     Activity Barriers & Cardiac Risk Stratification - 01/18/16 1524    Activity Barriers & Cardiac Risk Stratification   Activity Barriers Joint Problems;Arthritis;Left  Knee Replacement;Right Knee Replacement;Neck/Spine Problems;Back Problems;Deconditioning   Cardiac Risk Stratification High      6 Minute Walk:     6 Minute Walk      01/18/16 1608       6 Minute Walk   Phase Initial     Distance 850 feet     Walk Time 6 minutes     MPH 1.6     RPE 13     Resting HR 65 bpm     Resting BP 138/74 mmHg     Max Ex. HR 113 bpm     Max Ex. BP 144/76 mmHg        Initial Exercise Prescription:     Initial Exercise Prescription - 01/18/16 1600    Date of Initial Exercise Prescription   Date 01/18/16   Treadmill   MPH 1.5   Grade 0   Minutes 15   Recumbant Bike   Level 2   RPM 40   Watts 20   Minutes 15   NuStep   Level 2   Watts 40   Minutes 15   Recumbant Elliptical   Level 2   RPM 40   Watts 20   Minutes 15   REL-XR   Level 2   Watts 50   Minutes 15   Biostep-RELP   Level 1   Watts 15   Minutes 15   Prescription Details   Frequency (times per week) 2-3   Duration Progress to 45 minutes of aerobic exercise without signs/symptoms of physical distress   Intensity   THRR REST +  30   Ratings of Perceived Exertion 11-15   Progression   Progression Continue to progress workloads to maintain intensity without signs/symptoms of physical distress.   Resistance Training   Training Prescription Yes   Weight 2   Reps 10-15      Perform Capillary Blood Glucose checks as needed.  Exercise Prescription Changes:   Exercise Comments:   Discharge Exercise Prescription (Final Exercise Prescription Changes):   Nutrition:  Target Goals: Understanding of nutrition guidelines, daily intake of sodium 1500mg , cholesterol 200mg , calories 30% from fat and 7% or less from saturated fats, daily to have 5 or more servings of fruits and vegetables.  Biometrics:     Pre Biometrics - 01/18/16 1613    Pre Biometrics   Height 5\' 2"  (1.575 m)   Weight 163 lb 12.8 oz (74.299 kg)   Waist Circumference 36.75 inches   Hip  Circumference 43.5 inches   Waist to Hip Ratio 0.84 %   BMI (Calculated) 30       Nutrition Therapy Plan and Nutrition Goals:   Nutrition Discharge: Rate Your Plate Scores:   Nutrition Goals Re-Evaluation:   Psychosocial: Target Goals: Acknowledge  presence or absence of depression, maximize coping skills, provide positive support system. Participant is able to verbalize types and ability to use techniques and skills needed for reducing stress and depression.  Initial Review & Psychosocial Screening:     Initial Psych Review & Screening - 01/18/16 1815    Family Dynamics   Comments Pat scored 14 on her PHQ-9.  [name] has been on Paxil since the year 2000.  [name] has never seen a counselor or therapist.  Patient has good support system in place.  [name] is married; described her husband as being very supportive; has one son and two daughers; 5 grandchildren and 1 great grandchild.  [name] attends [employer].  Viviana Simpler, patient's daughter is her 67.     Screening Interventions   Interventions Encouraged to exercise;Program counselor consult      Quality of Life Scores:     Quality of Life - 01/18/16 1818    Quality of Life Scores   Health/Function Pre 24.33 %   Socioeconomic Pre 29.5 %   Psych/Spiritual Pre 28.29 %   Family Pre 30 %   GLOBAL Pre 26.89 %      PHQ-9:     Recent Review Flowsheet Data    Depression screen [facility] 2/9 01/18/2016   Decreased Interest 2   Down, Depressed, Hopeless 1   PHQ - 2 Score 3   Altered sleeping 3   Tired, decreased energy 3   Change in appetite 3   Feeling bad or failure about yourself  2   Trouble concentrating 0   Moving slowly or fidgety/restless 0   Suicidal thoughts 0   PHQ-9 Score 14   Difficult doing work/chores Not difficult at all      Psychosocial Evaluation and Intervention:   Psychosocial Re-Evaluation:   Vocational Rehabilitation: Provide vocational rehab assistance to qualifying candidates.    Vocational Rehab Evaluation & Intervention:     Vocational Rehab - 01/18/16 1527    Initial Vocational Rehab Evaluation & Intervention   Assessment shows need for Vocational Rehabilitation No      Education: Education Goals: Education classes will be provided on a weekly basis, covering required topics. Participant will state understanding/return demonstration of topics presented.  Learning Barriers/Preferences:     Learning Barriers/Preferences - 01/18/16 1524    Learning Barriers/Preferences   Learning Barriers Exercise Concerns   Learning Preferences None      Education Topics: General Nutrition Guidelines/Fats and Fiber: -Group instruction provided by verbal, written material, models and posters to present the general guidelines for heart healthy nutrition. Gives an explanation and review of dietary fats and fiber.   Controlling Sodium/Reading Food Labels: -Group verbal and written material supporting the discussion of sodium use in heart healthy nutrition. Review and explanation with models, verbal and written materials for utilization of the food label.   Exercise Physiology & Risk Factors: - Group verbal and written instruction with models to review the exercise physiology of the cardiovascular system and associated critical values. Details cardiovascular disease risk factors and the goals associated with each risk factor.   Aerobic Exercise & Resistance Training: - Gives group verbal and written discussion on the health impact of inactivity. On the components of aerobic and resistive training programs and the benefits of this training and how to safely progress through these programs.   Flexibility, Balance, General Exercise Guidelines: - Provides group verbal and written instruction on the benefits of flexibility and balance training programs. Provides general exercise guidelines with specific  guidelines to those with heart or lung disease. Demonstration and skill  practice provided.   Stress Management: - Provides group verbal and written instruction about the health risks of elevated stress, cause of high stress, and healthy ways to reduce stress.   Depression: - Provides group verbal and written instruction on the correlation between heart/lung disease and depressed mood, treatment options, and the stigmas associated with seeking treatment.   Anatomy & Physiology of the Heart: - Group verbal and written instruction and models provide basic cardiac anatomy and physiology, with the coronary electrical and arterial systems. Review of: AMI, Angina, Valve disease, Heart Failure, Cardiac Arrhythmia, Pacemakers, and the ICD.   Cardiac Procedures: - Group verbal and written instruction and models to describe the testing methods done to diagnose heart disease. Reviews the outcomes of the test results. Describes the treatment choices: Medical Management, Angioplasty, or Coronary Bypass Surgery.   Cardiac Medications: - Group verbal and written instruction to review commonly prescribed medications for heart disease. Reviews the medication, class of the drug, and side effects. Includes the steps to properly store meds and maintain the prescription regimen.   Go Sex-Intimacy & Heart Disease, Get SMART - Goal Setting: - Group verbal and written instruction through game format to discuss heart disease and the return to sexual intimacy. Provides group verbal and written material to discuss and apply goal setting through the application of the S.M.A.R.T. Method.   Other Matters of the Heart: - Provides group verbal, written materials and models to describe Heart Failure, Angina, Valve Disease, and Diabetes in the realm of heart disease. Includes description of the disease process and treatment options available to the cardiac patient.   Exercise & Equipment Safety: - Individual verbal instruction and demonstration of equipment use and safety with use of the  equipment.          Cardiac Rehab from 01/18/2016 in [facility] Cardiac and Pulmonary Rehab   Date  01/18/16   Educator  D. Joya Gaskins, RN   Instruction Review Code  1- partially meets, needs review/practice      Infection Prevention: - Provides verbal and written material to individual with discussion of infection control including proper hand washing and proper equipment cleaning during exercise session.      Cardiac Rehab from 01/18/2016 in [facility] Cardiac and Pulmonary Rehab   Date  01/18/16   Educator  D. Joya Gaskins, RN   Instruction Review Code  2- meets goals/outcomes      Falls Prevention: - Provides verbal and written material to individual with discussion of falls prevention and safety.      Cardiac Rehab from 01/18/2016 in [facility] Cardiac [facility] Pulmonary Rehab   Date  01/18/16   Educator  D. Joya Gaskins, RN   Instruction Review Code  2- meets goals/outcomes      Diabetes: - Individual verbal and written instruction to review signs/symptoms of diabetes, desired ranges of glucose level fasting, after meals and with exercise. Advice that pre and post exercise glucose checks will be done for 3 sessions at entry of program.    Knowledge Questionnaire Score:     Knowledge Questionnaire Score - 01/18/16 1525    Knowledge Questionnaire Score   Pre Score 22/28      Core Components/Risk Factors/Patient Goals at Admission:     Personal Goals and Risk Factors at Admission - 01/18/16 1529    Core Components/Risk Factors/Patient Goals on Admission   Sedentary Yes   Intervention Provide advice, education, support and counseling about physical activity/exercise  needs.;Develop an individualized exercise prescription for aerobic and resistive training based on initial evaluation findings, risk stratification, comorbidities and participant's personal goals.   Expected Outcomes Achievement of increased cardiorespiratory fitness and enhanced flexibility, muscular endurance and strength shown through  measurements of functional capacity and personal statement of participant.   Increase Strength and Stamina Yes   Intervention Provide advice, education, support and counseling about physical activity/exercise needs.;Develop an individualized exercise prescription for aerobic and resistive training based on initial evaluation findings, risk stratification, comorbidities and participant's personal goals.   Expected Outcomes Achievement of increased cardiorespiratory fitness and enhanced flexibility, muscular endurance and strength shown through measurements of functional capacity and personal statement of participant.   Hypertension Yes   Intervention Provide education on lifestyle modifcations including regular physical activity/exercise, weight management, moderate sodium restriction and increased consumption of fresh fruit, vegetables, and low fat dairy, alcohol moderation, and smoking cessation.;Monitor prescription use compliance.   Expected Outcomes Short Term: Continued assessment and intervention until BP is < 140/46mm HG in hypertensive participants. < 130/45mm HG in hypertensive participants with diabetes, heart failure or chronic kidney disease.;Long Term: Maintenance of blood pressure at goal levels.   Lipids Yes   Intervention Provide education and support for participant on nutrition & aerobic/resistive exercise along with prescribed medications to achieve LDL 70mg , HDL >40mg .   Expected Outcomes Short Term: Participant states understanding of desired cholesterol values and is compliant with medications prescribed. Participant is following exercise prescription and nutrition guidelines.;Long Term: Cholesterol controlled with medications as prescribed, with individualized exercise RX and with personalized nutrition plan. Value goals: LDL < 70mg , HDL > 40 mg.      Core Components/Risk Factors/Patient Goals Review:    Core Components/Risk Factors/Patient Goals at Discharge (Final Review):     ITP Comments:   Comments: Pat to start Cardiac Rehab on Monday, January 23, 2016 at 3:45 p.m.  Pat's main concern is her arthritis, as she has arthritis in her joints. The only med she takes for pain is Tylenol.  She is hoping she will be able to exercise and complete the program.

## 2016-01-23 DIAGNOSIS — Z952 Presence of prosthetic heart valve: Secondary | ICD-10-CM

## 2016-01-23 NOTE — Progress Notes (Signed)
Daily Session Note  Patient Details  Name: Kayla Gray MRN: [id number] Date of Birth: Sep 13, 1936 Referring Provider:  Minna Merritts, MD  Encounter Date: 01/23/2016  Check In:     Session Check In - 01/23/16 1610    Check-In   Location [facility]-Cardiac & Pulmonary Rehab   Staff Present Earlean Shawl, BS, ACSM CEP, Exercise Physiologist;Kaleen Rochette, DPT, CEEA;Carroll Enterkin, RN, BSN;Mary [name] Shropshire, RN   Supervising physician immediately available to respond to emergencies See telemetry face sheet for immediately available ER MD   Medication changes reported     No   Fall or balance concerns reported    No   Warm-up and Cool-down Performed on first and last piece of equipment   Resistance Training Performed Yes   VAD Patient? No         Goals Met:  No report of cardiac concerns or symptoms Strength training completed today  Goals Unmet:  Not Applicable  Comments: Patient completed exercise prescription and all exercise goals during rehab session. The exercise was tolerated well and the patient is progressing in the program.    Dr. Emily Filbert is Medical Director for [place] and [facility].

## 2016-01-25 ENCOUNTER — Ambulatory Visit (INDEPENDENT_AMBULATORY_CARE_PROVIDER_SITE_OTHER): Payer: PPO

## 2016-01-25 ENCOUNTER — Encounter: Payer: PPO | Admitting: *Deleted

## 2016-01-25 DIAGNOSIS — Z953 Presence of xenogenic heart valve: Secondary | ICD-10-CM

## 2016-01-25 DIAGNOSIS — Z5181 Encounter for therapeutic drug level monitoring: Secondary | ICD-10-CM | POA: Diagnosis not present

## 2016-01-25 DIAGNOSIS — I6310 Cerebral infarction due to embolism of unspecified precerebral artery: Secondary | ICD-10-CM

## 2016-01-25 DIAGNOSIS — I350 Nonrheumatic aortic (valve) stenosis: Secondary | ICD-10-CM | POA: Diagnosis not present

## 2016-01-25 DIAGNOSIS — Z954 Presence of other heart-valve replacement: Secondary | ICD-10-CM | POA: Diagnosis not present

## 2016-01-25 DIAGNOSIS — Z952 Presence of prosthetic heart valve: Secondary | ICD-10-CM | POA: Diagnosis not present

## 2016-01-25 LAB — POCT INR: INR: 2

## 2016-01-25 NOTE — Progress Notes (Signed)
Daily Session Note  Patient Details  Name: Kayla Gray MRN: [id number] Date of Birth: 03/10/1936 Referring Provider:  Minna Merritts, MD  Encounter Date: 01/25/2016  Check In:     Session Check In - 01/25/16 1650    Check-In   Location [facility]-Cardiac & Pulmonary Rehab   Staff Present Nyoka Cowden, RN;Carroll Enterkin, RN, Jana Half, RN, BSN   Supervising physician immediately available to respond to emergencies See telemetry face sheet for immediately available ER MD   Medication changes reported     Yes   Comments Warfarin 2.5 mg tabs one tab a day at 6 p.m. on Monday; then 1.5 tablet every day except Monday.     Fall or balance concerns reported    No   Warm-up and Cool-down Performed on first and last piece of equipment   Resistance Training Performed Yes   VAD Patient? No   Pain Assessment   Currently in Pain? No/denies         Goals Met:  Proper associated with RPD/PD & O2 Sat Exercise tolerated well No report of cardiac concerns or symptoms Strength training completed today  Goals Unmet:  Not Applicable  Comments:  Tolerated exercise session today.  Was able to complete exercise plan according to prescription.     Dr. Emily Filbert is Medical Director for [place] and [facility].

## 2016-01-25 NOTE — Progress Notes (Deleted)
Daily Session Note  Patient Details  Name: Kayla Gray MRN: [id number] Date of Birth: 1936/05/11 Referring Provider:  Minna Merritts, MD  Encounter Date: 01/25/2016  Check In:     Session Check In - 01/25/16 1628    Check-In   Warm-up and Cool-down Performed on first and last piece of equipment   Resistance Training Performed Yes   VAD Patient? No         Goals Met:  Independence with exercise equipment Exercise tolerated well No report of cardiac concerns or symptoms Strength training completed today  Goals Unmet:  Not Applicable  Comments:   Patient completed exercise prescription and all exercise goals during rehab session. The exercise was tolerated well and the patient is progressing in the program.    Dr. Emily Filbert is Medical Director for [place] and [facility].

## 2016-01-26 ENCOUNTER — Encounter: Payer: Self-pay | Admitting: Dietician

## 2016-01-26 ENCOUNTER — Encounter: Payer: PPO | Admitting: *Deleted

## 2016-01-26 DIAGNOSIS — Z952 Presence of prosthetic heart valve: Secondary | ICD-10-CM | POA: Diagnosis not present

## 2016-01-26 NOTE — Progress Notes (Signed)
Daily Session Note  Patient Details  Name: Kayla Gray MRN: [id number] Date of Birth: January 21, 1936 Referring Provider:    Encounter Date: 01/26/2016  Check In:     Session Check In - 01/26/16 1711    Check-In   Location [facility]-Cardiac & Pulmonary Rehab   Staff Present Nyoka Cowden, RN;Carroll Enterkin, RN, Jana Half, RN, BSN   Supervising physician immediately available to respond to emergencies See telemetry face sheet for immediately available ER MD   Medication changes reported     Yes   Fall or balance concerns reported    No   Warm-up and Cool-down Performed on first and last piece of equipment   Resistance Training Performed Yes   VAD Patient? No   Pain Assessment   Currently in Pain? No/denies         Goals Met:  Proper associated with RPD/PD & O2 Sat Exercise tolerated well No report of cardiac concerns or symptoms Strength training completed today  Goals Unmet:  Not Applicable  Comments:  Patient completed exercise prescription and all exercise goals during rehab session. The exercise was tolerated well and the patient is progressing in the program.    Dr. Emily Filbert is Medical Director for [place] and [facility].

## 2016-01-30 DIAGNOSIS — Z952 Presence of prosthetic heart valve: Secondary | ICD-10-CM

## 2016-01-30 NOTE — Progress Notes (Signed)
Daily Session Note  Patient Details  Name: LOWELLA KINDLEY MRN: [id number] Date of Birth: November 06, 1935 Referring Provider:    Encounter Date: 01/30/2016  Check In:     Session Check In - 01/30/16 1607    Check-In   Location [facility]-Cardiac & Pulmonary Rehab   Staff Present Nyoka Cowden, RN;Susanne Bice, RN, BSN, CCRP;Jozie Wulf Brayton El, DPT, CEEA   Supervising physician immediately available to respond to emergencies See telemetry face sheet for immediately available ER MD   Medication changes reported     No   Fall or balance concerns reported    No   Warm-up and Cool-down Performed on first and last piece of equipment   Resistance Training Performed Yes   VAD Patient? No         Goals Met:  Independence with exercise equipment No report of cardiac concerns or symptoms Strength training completed today  Goals Unmet:  Not Applicable  Comments: Patient completed exercise prescription and all exercise goals during rehab session. The exercise was tolerated well and the patient is progressing in the program.    Dr. Emily Filbert is Medical Director for [place] and [facility].

## 2016-02-01 ENCOUNTER — Encounter: Payer: PPO | Admitting: *Deleted

## 2016-02-01 DIAGNOSIS — Z952 Presence of prosthetic heart valve: Secondary | ICD-10-CM

## 2016-02-01 NOTE — Progress Notes (Signed)
Daily Session Note  Patient Details  Name: Kayla Gray MRN: [id number] Date of Birth: 1936/05/04 Referring Provider:    Encounter Date: 02/01/2016  Check In:     Session Check In - 02/01/16 1603    Check-In   Location [facility]-Cardiac & Pulmonary Rehab   Staff Present Nyoka Cowden, RN;Jovonta Levit, RN, Alex Gardener, DPT, CEEA   Supervising physician immediately available to respond to emergencies See telemetry face sheet for immediately available ER MD   Medication changes reported     No   Fall or balance concerns reported    No   Warm-up and Cool-down Performed on first and last piece of equipment   Resistance Training Performed Yes   VAD Patient? No   Pain Assessment   Currently in Pain? No/denies         Goals Met:  Proper associated with RPD/PD & O2 Sat Exercise tolerated well  Goals Unmet:  Not Applicable  Comments:    Dr. Emily Filbert is Medical Director for [place] and [facility].

## 2016-02-02 ENCOUNTER — Encounter: Payer: PPO | Admitting: *Deleted

## 2016-02-02 DIAGNOSIS — Z952 Presence of prosthetic heart valve: Secondary | ICD-10-CM

## 2016-02-02 NOTE — Progress Notes (Signed)
Daily Session Note  Patient Details  Name: KIRSI HUGH MRN: [id number] Date of Birth: 01/06/1936 Referring Provider:    Encounter Date: 02/02/2016  Check In:     Session Check In - 02/02/16 1705    Check-In   Staff Present Heath Lark, RN, BSN, CCRP;Carroll Enterkin, RN, Alex Gardener, DPT, CEEA   Supervising physician immediately available to respond to emergencies See telemetry face sheet for immediately available ER MD   Medication changes reported     No   Fall or balance concerns reported    No   Warm-up and Cool-down Performed on first and last piece of equipment   VAD Patient? No   Pain Assessment   Currently in Pain? No/denies         Goals Met:  Exercise tolerated well Personal goals reviewed No report of cardiac concerns or symptoms Strength training completed today  Goals Unmet:  Not Applicable  Comments: see exercise comments   Dr. Emily Filbert is Medical Director for [place] and [facility].

## 2016-02-05 ENCOUNTER — Encounter: Payer: Self-pay | Admitting: *Deleted

## 2016-02-05 DIAGNOSIS — Z952 Presence of prosthetic heart valve: Secondary | ICD-10-CM

## 2016-02-05 NOTE — Progress Notes (Signed)
Cardiac Individual Treatment Plan  Patient Details  Name: Kayla Gray MRN: [id number] Date of Birth: [birth year] Referring Provider:    Initial Encounter Date:       Cardiac Rehab from 01/18/2016 in [facility] Cardiac and Pulmonary Rehab   Date  01/18/16      Visit Diagnosis: S/P AVR (aortic valve replacement)  Patient's Home Medications on Admission:  Current outpatient prescriptions:  .  acetaminophen (TYLENOL) 325 MG tablet, Take 650 mg by mouth every 6 (six) hours as needed., Disp: , Rfl:  .  amiodarone (PACERONE) 200 MG tablet, Take 200 mg by mouth daily., Disp: , Rfl: 3 .  Calcium Carbonate-Vitamin D (CALCIUM 600 + D PO), Take by mouth 2 (two) times daily., Disp: , Rfl:  .  ezetimibe (ZETIA) 10 MG tablet, Take 10 mg by mouth daily., Disp: , Rfl:  .  fluticasone (FLONASE) 50 MCG/ACT nasal spray, Place 2 sprays into the nose daily as needed., Disp: , Rfl:  .  metoprolol tartrate (LOPRESSOR) 25 MG tablet, TAKE 1 TABLET TWICE DAILY, Disp: 60 tablet, Rfl: 3 .  Multiple Vitamin (MULTIVITAMIN) tablet, Take 1 tablet by mouth daily., Disp: , Rfl:  .  NON FORMULARY, at bedtime. CPAP, Disp: , Rfl:  .  pantoprazole (PROTONIX) 40 MG tablet, Take 40 mg by mouth daily. , Disp: , Rfl:  .  PARoxetine (PAXIL) 20 MG tablet, Take 20 mg by mouth daily., Disp: , Rfl:  .  warfarin (COUMADIN) 2.5 MG tablet, Take 1 tablet (2.5 mg total) by mouth daily at 6 PM. Or as directed by Dr. [name] (Patient taking differently: Take 2.5 mg by mouth daily. Or as directed by Dr. [name]), Disp: 30 tablet, Rfl: 1 .  warfarin (COUMADIN) 2.5 MG tablet, TAKE 1 TABLET (2.5 MG TOTAL) BY MOUTH DAILY AT 6 PM. OR AS DIRECTED BY DR. [name] (Patient not taking: Reported on 01/18/2016), Disp: 45 tablet, Rfl: 3 .  warfarin (COUMADIN) 2.5 MG tablet, Take 2.5 mg by mouth daily. Reported on 01/25/2016, Disp: , Rfl:   Past Medical History: Past Medical History  Diagnosis Date  . Obstructive sleep apnea   . Hyperlipidemia   . Depression    . Osteopenia   . Iron deficiency anemia   . Syncope and collapse   . Aortic stenosis   . Bilateral cataracts   . Diverticulosis   . Osteoarthritis   . Gastroesophageal reflux disease   . Peptic ulcer disease   . Urinary tract infection   . Heart murmur   . Breast cancer ([place])   . Supraventricular tachycardia ([place])   . Stroke ([place])     11/14/13  . Chronic diastolic (congestive) heart failure ([place])   . Bleeding nose     Thurs night (09/08/15) and Fri (09/09/15) left side.Bleeding didn't last long  . S/P aortic valve replacement with bioprosthetic valve 09/16/2015    23 mm [employer] Elite bioprosthetic tissue valve    Tobacco Use: History  Smoking status  . Never Smoker   Smokeless tobacco  . Not on file    Labs: Recent Review Flowsheet Data    Labs for ITP Cardiac and Pulmonary Rehab Latest Ref Rng 09/16/2015 09/16/2015 09/16/2015 09/17/2015 09/17/2015   PHART 7.350 - 7.450 7.271(L) 7.333(L) 7.305(L) 7.369 -   PCO2ART 35.0 - 45.0 mmHg 46.6(H) 43.5 43.2 44.4 -   HCO3 20.0 - 24.0 mEq/L 21.4 23.0 21.3 25.4(H) -   TCO2 0 - 100 mmol/L _0 ACIDBASEDEF 0.0 -  2.0 mmol/L 5.0(H) 3.0(H) 5.0(H) - -   O2SAT - 92.0 94.0 95.0 95.0 -       Exercise Target Goals:    Exercise Program Goal: Individual exercise prescription set with THRR, safety & activity barriers. Participant demonstrates ability to understand and report RPE using BORG scale, to self-measure pulse accurately, and to acknowledge the importance of the exercise prescription.  Exercise Prescription Goal: Starting with aerobic activity 30 plus minutes a day, 3 days per week for initial exercise prescription. Provide home exercise prescription and guidelines that participant acknowledges understanding prior to discharge.  Activity Barriers & Risk Stratification:     Activity Barriers & Cardiac Risk Stratification - 01/18/16 1524    Activity Barriers & Cardiac Risk Stratification   Activity Barriers Joint  Problems;Arthritis;Left Knee Replacement;Right Knee Replacement;Neck/Spine Problems;Back Problems;Deconditioning   Cardiac Risk Stratification High      6 Minute Walk:     6 Minute Walk      01/18/16 1608       6 Minute Walk   Phase Initial     Distance 850 feet     Walk Time 6 minutes     MPH 1.6     RPE 13     Resting HR 65 bpm     Resting BP 138/74 mmHg     Max Ex. HR 113 bpm     Max Ex. BP 144/76 mmHg        Initial Exercise Prescription:     Initial Exercise Prescription - 01/18/16 1600    Date of Initial Exercise RX and Referring Provider   Date 01/18/16   Treadmill   MPH 1.5   Grade 0   Minutes 15   Recumbant Bike   Level 2   RPM 40   Watts 20   Minutes 15   NuStep   Level 2   Watts 40   Minutes 15   Recumbant Elliptical   Level 2   RPM 40   Watts 20   Minutes 15   REL-XR   Level 2   Watts 50   Minutes 15   Biostep-RELP   Level 1   Watts 15   Minutes 15   Prescription Details   Frequency (times per week) 2-3   Duration Progress to 45 minutes of aerobic exercise without signs/symptoms of physical distress   Intensity   THRR REST +  30   Ratings of Perceived Exertion 11-15   Progression   Progression Continue to progress workloads to maintain intensity without signs/symptoms of physical distress.   Resistance Training   Training Prescription Yes   Weight 2   Reps 10-15      Perform Capillary Blood Glucose checks as needed.  Exercise Prescription Changes:     Exercise Prescription Changes      02/01/16 1600 02/02/16 1217         Exercise Review   Progression  Yes      Response to Exercise   Blood Pressure (Admit)  126/74 mmHg      Blood Pressure (Exercise)  128/80 mmHg      Blood Pressure (Exit)  122/82 mmHg      Heart Rate (Admit)  65 bpm      Heart Rate (Exercise)  80 bpm      Heart Rate (Exit)  67 bpm      Rating of Perceived Exertion (Exercise)  12      Comments  Pat attends Heart Track regularly and is progressing  in  duration and intensity.  Recently, she experienced a flare-up of her back pain and exercise modifications were made to accommodate her discomfort.      Duration  Progress to 45 minutes of aerobic exercise without signs/symptoms of physical distress      Intensity  Rest + 30      Resistance Training   Training Prescription Yes Yes      Weight 2 2      Reps 10-15 10-15      Interval Training   Interval Training  No      Treadmill   MPH 1.5       Grade 0       Minutes 15       Recumbant Bike   Level 2       RPM 40       Watts 20       Minutes 15       NuStep   Level 3 3      Watts 40 34      Minutes 15 15      Recumbant Elliptical   Level 2       RPM 40       Watts 20       Minutes 15       REL-XR   Level 2       Watts 50       Minutes 15       Biostep-RELP   Level 1 2      Watts 15 15      Minutes 15 15         Exercise Comments:     Exercise Comments      02/02/16 1705 02/03/16 1222         Exercise Comments Pat cam e in today stating her back pain has increased and she ,may need to see her doctor about the cortisone shots. After reviewing her ex prescription with Jacqlyn Larsen, it was decided she will not work on the XR Ellipitcal today, but try the Biostep recumbent ellipitical.  At the end of the session she was not feeling any increased pain. Goal is for Pat to continue to exercise with necessary modifications in order to improve cardiovascular tolerance and avoid exacerbation of back pain.         Discharge Exercise Prescription (Final Exercise Prescription Changes):     Exercise Prescription Changes - 02/02/16 1217    Exercise Review   Progression Yes   Response to Exercise   Blood Pressure (Admit) 126/74 mmHg   Blood Pressure (Exercise) 128/80 mmHg   Blood Pressure (Exit) 122/82 mmHg   Heart Rate (Admit) 65 bpm   Heart Rate (Exercise) 80 bpm   Heart Rate (Exit) 67 bpm   Rating of Perceived Exertion (Exercise) 12   Comments Pat attends Heart Track  regularly and is progressing in duration and intensity.  Recently, she experienced a flare-up of her back pain and exercise modifications were made to accommodate her discomfort.   Duration Progress to 45 minutes of aerobic exercise without signs/symptoms of physical distress   Intensity Rest + 30   Resistance Training   Training Prescription Yes   Weight 2   Reps 10-15   Interval Training   Interval Training No   NuStep   Level 3   Watts 34   Minutes 15   Biostep-RELP   Level 2   Watts 15   Minutes 15  Nutrition:  Target Goals: Understanding of nutrition guidelines, daily intake of sodium <151m, cholesterol <2073m calories 30% from fat and 7% or less from saturated fats, daily to have 5 or more servings of fruits and vegetables.  Biometrics:     Pre Biometrics - 01/18/16 1613    Pre Biometrics   Height _0  (1.575 m)   Weight 163 lb 12.8 oz (74.299 kg)   Waist Circumference 36.75 inches   Hip Circumference 43.5 inches   Waist to Hip Ratio 0.84 %   BMI (Calculated) 30       Nutrition Therapy Plan and Nutrition Goals:     Nutrition Therapy & Goals - 01/26/16 1723    Nutrition Therapy   Drug/Food Interactions Coumadin/Vit K   Intervention Plan   Intervention Prescribe, educate and counsel regarding individualized specific dietary modifications aiming towards targeted core components such as weight, hypertension, lipid management, diabetes, heart failure and other comorbidities.   Expected Outcomes Short Term Goal: Understand basic principles of dietary content, such as calories, fat, sodium, cholesterol and nutrients.;Long Term Goal: Adherence to prescribed nutrition plan.      Nutrition Discharge: Rate Your Plate Scores:     Nutrition Assessments - 01/26/16 1606    Rate Your Plate Scores   Pre Score 61   Pre Score % 68 %      Nutrition Goals Re-Evaluation:   Psychosocial: Target Goals: Acknowledge presence or absence of depression, maximize  coping skills, provide positive support system. Participant is able to verbalize types and ability to use techniques and skills needed for reducing stress and depression.  Initial Review & Psychosocial Screening:     Initial Psych Review & Screening - 01/18/16 1815    Family Dynamics   Comments Pat scored 14 on her PHQ-9.  PaFraser Dinas been on Paxil since the year 2000.  PaFraser Dinas never seen a counselor or therapist.  Patient has good support system in place.  PaFraser Dins married; described her husband as being very supportive; has one son and two daughers; 5 grandchildren and 1 great grandchild.  PaFraser Dinttends La[employer] KeViviana Simplerpatient's daughter is her HC20    Screening Interventions   Interventions Encouraged to exercise;Program counselor consult      Quality of Life Scores:     Quality of Life - 01/18/16 1818    Quality of Life Scores   Health/Function Pre 24.33 %   Socioeconomic Pre 29.5 %   Psych/Spiritual Pre 28.29 %   Family Pre 30 %   GLOBAL Pre 26.89 %      PHQ-9:     Recent Review Flowsheet Data    [facility]   Decreased Interest 2   Down, Depressed, Hopeless 1   PHQ - 2 Score 3   Altered sleeping 3   Tired, decreased energy 3   Change in appetite 3   Feeling bad or failure about yourself  2   Trouble concentrating 0   Moving slowly or fidgety/restless 0   Suicidal thoughts 0   PHQ-9 Score 14   Difficult doing work/chores Not difficult at all      Psychosocial Evaluation and Intervention:     Psychosocial Evaluation - 01/25/16 1647    Psychosocial Evaluation & Interventions   Interventions Encouraged to exercise with the program and follow exercise prescription;Relaxation education;Stress management education   Comments Counselor met with Ms. Norton today for initial psychosocial evaluation.   She is a [age]ear old  who had valve replacement 4 months ago.  She has a strong support system with a spouse of over 6 years and  (3) adult children who live close by, as well as active involvement in her local church.  Ms. Tourangeau has several other health issues including knee replacements and severe arthritis.  She also is a breast cancer survivor for 17 years.  Ms. Adney denies a history of depression or anxiety but reports she has been on Paxil since the breast cancer in 2000.  Although her PHQ-9 score was 14, she reports that her mood is typically positive, except for during the winter months when she notices more sadness.  Ms. Forni states that she has minimal stress in her life currently, other than a disabled daughter who just got out of the hopsital with Pneumonia.  Ms. Nolley has goals to walk better with increased balance, to increase her energy, and to be educated on ways to eat more healthfully.  She plans to walk outside to maintain consistency in exercise once she completes this program.     Continued Psychosocial Services Needed Yes  Ms. Olander will benefit from consistent exercise; meeting with the dietician for her healthy eating goal; and participate in the psychoeducational components of this program, especially on depression.      Psychosocial Re-Evaluation:   Vocational Rehabilitation: Provide vocational rehab assistance to qualifying candidates.   Vocational Rehab Evaluation & Intervention:     Vocational Rehab - 01/18/16 1527    Initial Vocational Rehab Evaluation & Intervention   Assessment shows need for Vocational Rehabilitation No      Education: Education Goals: Education classes will be provided on a weekly basis, covering required topics. Participant will state understanding/return demonstration of topics presented.  Learning Barriers/Preferences:     Learning Barriers/Preferences - 01/18/16 1524    Learning Barriers/Preferences   Learning Barriers Exercise Concerns   Learning Preferences None      Education Topics: General Nutrition Guidelines/Fats and Fiber: -Group instruction  provided by verbal, written material, models and posters to present the general guidelines for heart healthy nutrition. Gives an explanation and review of dietary fats and fiber.   Controlling Sodium/Reading Food Labels: -Group verbal and written material supporting the discussion of sodium use in heart healthy nutrition. Review and explanation with models, verbal and written materials for utilization of the food label.          Cardiac Rehab from 02/01/2016 in [facility] Cardiac and Pulmonary Rehab   Date  01/23/16   Educator  PI   Instruction Review Code  2- meets goals/outcomes      Exercise Physiology & Risk Factors: - Group verbal and written instruction with models to review the exercise physiology of the cardiovascular system and associated critical values. Details cardiovascular disease risk factors and the goals associated with each risk factor.      Cardiac Rehab from 02/01/2016 in [facility] Cardiac and Pulmonary Rehab   Date  02/01/16   Educator  Oleta Mouse   Instruction Review Code  2- meets goals/outcomes      Aerobic Exercise & Resistance Training: - Gives group verbal and written discussion on the health impact of inactivity. On the components of aerobic and resistive training programs and the benefits of this training and how to safely progress through these programs.   Flexibility, Balance, General Exercise Guidelines: - Provides group verbal and written instruction on the benefits of flexibility and balance training programs. Provides general exercise guidelines with specific guidelines to those  with heart or lung disease. Demonstration and skill practice provided.   Stress Management: - Provides group verbal and written instruction about the health risks of elevated stress, cause of high stress, and healthy ways to reduce stress.   Depression: - Provides group verbal and written instruction on the correlation between heart/lung disease and depressed mood, treatment  options, and the stigmas associated with seeking treatment.   Anatomy & Physiology of the Heart: - Group verbal and written instruction and models provide basic cardiac anatomy and physiology, with the coronary electrical and arterial systems. Review of: AMI, Angina, Valve disease, Heart Failure, Cardiac Arrhythmia, Pacemakers, and the ICD.   Cardiac Procedures: - Group verbal and written instruction and models to describe the testing methods done to diagnose heart disease. Reviews the outcomes of the test results. Describes the treatment choices: Medical Management, Angioplasty, or Coronary Bypass Surgery.   Cardiac Medications: - Group verbal and written instruction to review commonly prescribed medications for heart disease. Reviews the medication, class of the drug, and side effects. Includes the steps to properly store meds and maintain the prescription regimen.   Go Sex-Intimacy & Heart Disease, Get SMART - Goal Setting: - Group verbal and written instruction through game format to discuss heart disease and the return to sexual intimacy. Provides group verbal and written material to discuss and apply goal setting through the application of the S.M.A.R.T. Method.   Other Matters of the Heart: - Provides group verbal, written materials and models to describe Heart Failure, Angina, Valve Disease, and Diabetes in the realm of heart disease. Includes description of the disease process and treatment options available to the cardiac patient.      Cardiac Rehab from 02/01/2016 in [facility] Cardiac and Pulmonary Rehab   Date  01/30/16   Educator  SB   Instruction Review Code  2- meets goals/outcomes      Exercise & Equipment Safety: - Individual verbal instruction and demonstration of equipment use and safety with use of the equipment.      Cardiac Rehab from 01/18/2016 in [facility] Cardiac and Pulmonary Rehab   Date  01/18/16   Educator  D. Joya Gaskins, RN   Instruction Review Code  1- partially meets,  needs review/practice      Infection Prevention: - Provides verbal and written material to individual with discussion of infection control including proper hand washing and proper equipment cleaning during exercise session.      Cardiac Rehab from 02/01/2016 in [facility] Cardiac and Pulmonary Rehab   Date  01/18/16   Educator  D. Joya Gaskins, RN   Instruction Review Code  2- meets goals/outcomes      Falls Prevention: - Provides verbal and written material to individual with discussion of falls prevention and safety.      Cardiac Rehab from 02/01/2016 in [facility] Cardiac and Pulmonary Rehab   Date  01/18/16   Educator  D. Joya Gaskins, RN   Instruction Review Code  2- meets goals/outcomes      Diabetes: - Individual verbal and written instruction to review signs/symptoms of diabetes, desired ranges of glucose level fasting, after meals and with exercise. Advice that pre and post exercise glucose checks will be done for 3 sessions at entry of program.    Knowledge Questionnaire Score:     Knowledge Questionnaire Score - 01/18/16 1525    Knowledge Questionnaire Score   Pre Score 22/28      Core Components/Risk Factors/Patient Goals at Admission:     Personal Goals and Risk Factors  at Admission - 01/18/16 1529    Core Components/Risk Factors/Patient Goals on Admission   Sedentary Yes   Intervention Provide advice, education, support and counseling about physical activity/exercise needs.;Develop an individualized exercise prescription for aerobic and resistive training based on initial evaluation findings, risk stratification, comorbidities and participant's personal goals.   Expected Outcomes Achievement of increased cardiorespiratory fitness and enhanced flexibility, muscular endurance and strength shown through measurements of functional capacity and personal statement of participant.   Increase Strength and Stamina Yes   Intervention Provide advice, education, support and counseling about  physical activity/exercise needs.;Develop an individualized exercise prescription for aerobic and resistive training based on initial evaluation findings, risk stratification, comorbidities and participant's personal goals.   Expected Outcomes Achievement of increased cardiorespiratory fitness and enhanced flexibility, muscular endurance and strength shown through measurements of functional capacity and personal statement of participant.   Hypertension Yes   Intervention Provide education on lifestyle modifcations including regular physical activity/exercise, weight management, moderate sodium restriction and increased consumption of fresh fruit, vegetables, and low fat dairy, alcohol moderation, and smoking cessation.;Monitor prescription use compliance.   Expected Outcomes Short Term: Continued assessment and intervention until BP is < 140/72m HG in hypertensive participants. < 130/824mHG in hypertensive participants with diabetes, heart failure or chronic kidney disease.;Long Term: Maintenance of blood pressure at goal levels.   Lipids Yes   Intervention Provide education and support for participant on nutrition & aerobic/resistive exercise along with prescribed medications to achieve LDL <7063mHDL >39m64m Expected Outcomes Short Term: Participant states understanding of desired cholesterol values and is compliant with medications prescribed. Participant is following exercise prescription and nutrition guidelines.;Long Term: Cholesterol controlled with medications as prescribed, with individualized exercise RX and with personalized nutrition plan. Value goals: LDL < 70mg4mL > 40 mg.      Core Components/Risk Factors/Patient Goals Review:    Core Components/Risk Factors/Patient Goals at Discharge (Final Review):    ITP Comments:     ITP Comments      01/26/16 1715 02/05/16 1040         ITP Comments Pat reports that she is tired at times but overall she feels better. Pat sFraser Din that she  goes to her doctor every 6 months. She said she is doing well on her cholestrol medicine and her blood work gets checked yearly. Her blood pressure is very good.  30 Day review. Continue with ITP  six visits since orientation         Comments:

## 2016-02-06 ENCOUNTER — Encounter: Payer: PPO | Admitting: *Deleted

## 2016-02-06 DIAGNOSIS — Z952 Presence of prosthetic heart valve: Secondary | ICD-10-CM

## 2016-02-06 NOTE — Progress Notes (Signed)
Daily Session Note  Patient Details  Name: Kayla Gray MRN: [id number] Date of Birth: 04/27/1936 Referring Provider:    Encounter Date: 02/06/2016  Check In:     Session Check In - 02/06/16 1609    Check-In   Staff Present Heath Lark, RN, BSN, CCRP;Rebecca Sickles, DPT, [place] physician immediately available to respond to emergencies See telemetry face sheet for immediately available ER MD   Medication changes reported     No   Fall or balance concerns reported    No   Warm-up and Cool-down Performed on first and last piece of equipment   VAD Patient? No   Pain Assessment   Currently in Pain? No/denies         Goals Met:  Exercise tolerated well No report of cardiac concerns or symptoms  Goals Unmet:  Not Applicable  Comments: Doing well with exercise prescription progression.    Dr. Emily Filbert is Medical Director for [facility] and [facility].

## 2016-02-07 DIAGNOSIS — M4806 Spinal stenosis, lumbar region: Secondary | ICD-10-CM | POA: Diagnosis not present

## 2016-02-07 DIAGNOSIS — M4726 Other spondylosis with radiculopathy, lumbar region: Secondary | ICD-10-CM | POA: Diagnosis not present

## 2016-02-08 ENCOUNTER — Encounter: Payer: PPO | Admitting: *Deleted

## 2016-02-08 DIAGNOSIS — Z952 Presence of prosthetic heart valve: Secondary | ICD-10-CM | POA: Diagnosis not present

## 2016-02-08 NOTE — Progress Notes (Signed)
Daily Session Note  Patient Details  Name: Kayla Gray MRN: [id number] Date of Birth: 05-Nov-1935 Referring Provider:    Encounter Date: 02/08/2016  Check In:     Session Check In - 02/08/16 1607    Check-In   Location [facility]-Cardiac & Pulmonary Rehab   Staff Present Heath Lark, RN, BSN, CCRP;Mary [name] Shropshire, RN;Chrystina Naff Joya Gaskins, RN, BSN   Supervising physician immediately available to respond to emergencies See telemetry face sheet for immediately available ER MD   Medication changes reported     No   Fall or balance concerns reported    No   Warm-up and Cool-down Performed on first and last piece of equipment   Resistance Training Performed Yes   VAD Patient? No   Pain Assessment   Currently in Pain? No/denies         Goals Met:  Proper associated with RPD/PD & O2 Sat Exercise tolerated well No report of cardiac concerns or symptoms Strength training completed today  Goals Unmet:  Not Applicable  Comments:   Patient completed exercise prescription and all exercise goals during rehab session. The exercise was tolerated well and the patient is progressing in the program.   Dr. Emily Filbert is Medical Director for [facility] and [facility].

## 2016-02-09 ENCOUNTER — Encounter: Payer: PPO | Admitting: *Deleted

## 2016-02-09 DIAGNOSIS — Z952 Presence of prosthetic heart valve: Secondary | ICD-10-CM | POA: Diagnosis not present

## 2016-02-09 NOTE — Progress Notes (Signed)
Daily Session Note  Patient Details  Name: Kayla Gray MRN: [id number] Date of Birth: 1936/07/31 Referring Provider:    Encounter Date: 02/09/2016  Check In:     Session Check In - 02/09/16 1641    Check-In   Location [facility]   Staff Present Nyoka Cowden, RN;Susanne Bice, RN, BSN, CCRP;Areeb Corron Joya Gaskins, RN, BSN   Supervising physician immediately available to respond to emergencies See telemetry face sheet for immediately available ER MD   Medication changes reported     No   Fall or balance concerns reported    No   Warm-up and Cool-down Performed on first and last piece of equipment   Resistance Training Performed Yes   VAD Patient? No   Pain Assessment   Currently in Pain? No/denies         Goals Met:  Proper associated with RPD/PD & O2 Sat Exercise tolerated well Personal goals reviewed No report of cardiac concerns or symptoms Strength training completed today  Goals Unmet:  Not Applicable  Comments:  Patient completed exercise prescription and all exercise goals during rehab session. The exercise was tolerated well and the patient is progressing in the program.    Dr. Emily Filbert is Medical Director for [place] and [facility].

## 2016-02-13 ENCOUNTER — Encounter: Payer: PPO | Attending: Cardiovascular Disease

## 2016-02-13 DIAGNOSIS — Z952 Presence of prosthetic heart valve: Secondary | ICD-10-CM | POA: Insufficient documentation

## 2016-02-13 NOTE — Progress Notes (Signed)
Daily Session Note  Patient Details  Name: Kayla Gray MRN: [id number] Date of Birth: [birth year] Referring Provider:    Encounter Date: 02/13/2016  Check In:     Session Check In - 02/13/16 1657    Check-In   Location [facility]   Staff Present Heath Lark, RN, BSN, Laveda Norman, BS, ACSM CEP, Exercise Physiologist;Shawnta Zimbelman Brayton El, DPT, CEEA   Supervising physician immediately available to respond to emergencies See telemetry face sheet for immediately available ER MD   Medication changes reported     No   Fall or balance concerns reported    No   Warm-up and Cool-down Performed on first and last piece of equipment   Resistance Training Performed Yes   VAD Patient? No         Goals Met:  Independence with exercise equipment Exercise tolerated well No report of cardiac concerns or symptoms  Goals Unmet:  Not Applicable  Comments: Patient completed exercise prescription and all exercise goals during rehab session. The exercise was tolerated well and the patient is progressing in the program.    Dr. Emily Filbert is Medical Director for [place] and [facility].

## 2016-02-15 ENCOUNTER — Encounter: Payer: PPO | Admitting: *Deleted

## 2016-02-15 ENCOUNTER — Ambulatory Visit (INDEPENDENT_AMBULATORY_CARE_PROVIDER_SITE_OTHER): Payer: PPO

## 2016-02-15 DIAGNOSIS — Z5181 Encounter for therapeutic drug level monitoring: Secondary | ICD-10-CM | POA: Diagnosis not present

## 2016-02-15 DIAGNOSIS — Z954 Presence of other heart-valve replacement: Secondary | ICD-10-CM

## 2016-02-15 DIAGNOSIS — I350 Nonrheumatic aortic (valve) stenosis: Secondary | ICD-10-CM | POA: Diagnosis not present

## 2016-02-15 DIAGNOSIS — I6310 Cerebral infarction due to embolism of unspecified precerebral artery: Secondary | ICD-10-CM | POA: Diagnosis not present

## 2016-02-15 DIAGNOSIS — Z952 Presence of prosthetic heart valve: Secondary | ICD-10-CM

## 2016-02-15 DIAGNOSIS — Z953 Presence of xenogenic heart valve: Secondary | ICD-10-CM

## 2016-02-15 LAB — POCT INR: INR: 1.5

## 2016-02-15 NOTE — Progress Notes (Signed)
Daily Session Note  Patient Details  Name: Kayla Gray MRN: [id number] Date of Birth: 1936-10-14 Referring Provider:    Encounter Date: 02/15/2016  Check In:     Session Check In - 02/15/16 1602    Check-In   Location [facility]   Staff Present Gerlene Burdock, RN, Alex Gardener, DPT, CEEA;Diane Joya Gaskins, RN, BSN   Supervising physician immediately available to respond to emergencies See telemetry face sheet for immediately available ER MD   Medication changes reported     No   Fall or balance concerns reported    No   Warm-up and Cool-down Performed on first and last piece of equipment   Resistance Training Performed Yes   VAD Patient? No   Pain Assessment   Currently in Pain? No/denies         Goals Met:  Proper associated with RPD/PD & O2 Sat Exercise tolerated well  Goals Unmet:  Not Applicable  Comments:    Dr. Emily Filbert is Medical Director for [place] and [facility].

## 2016-02-20 DIAGNOSIS — Z952 Presence of prosthetic heart valve: Secondary | ICD-10-CM | POA: Diagnosis not present

## 2016-02-20 NOTE — Progress Notes (Signed)
Daily Session Note  Patient Details  Name: ELLIANAH CORDY MRN: [id number] Date of Birth: 03-Dec-1935 Referring Provider:    Encounter Date: 02/20/2016  Check In:     Session Check In - 02/20/16 1611    Check-In   Location [facility]   Staff Present Heath Lark, RN, BSN, CCRP;Rebecca Sickles, DPT, Tomi Bamberger, RN, BSN   Supervising physician immediately available to respond to emergencies See telemetry face sheet for immediately available ER MD   Medication changes reported     No   Fall or balance concerns reported    No   Warm-up and Cool-down Performed on first and last piece of equipment   Resistance Training Performed Yes   VAD Patient? No         Goals Met:  Independence with exercise equipment Exercise tolerated well No report of cardiac concerns or symptoms  Goals Unmet:  Not Applicable  Comments: Doing well with exercise prescription progression.    Dr. Emily Filbert is Medical Director for [place] and [facility].

## 2016-02-20 NOTE — Progress Notes (Signed)
Daily Session Note  Patient Details  Name: [name] MRN: [id number] Date of Birth: 27-Jul-1936 Referring Provider:    Encounter Date: 02/20/2016  Check In:     Session Check In - 02/20/16 1611    Check-In   Location [facility]   Staff Present Heath Lark, RN, BSN, CCRP;Kennetha Pearman, DPT, Tomi Bamberger, RN, BSN   Supervising physician immediately available to respond to emergencies See telemetry face sheet for immediately available ER MD   Medication changes reported     No   Fall or balance concerns reported    No   Warm-up and Cool-down Performed on first and last piece of equipment   Resistance Training Performed Yes   VAD Patient? No         Goals Met:  Independence with exercise equipment Exercise tolerated well No report of cardiac concerns or symptoms  Goals Unmet:  Not Applicable  Comments: Patient completed exercise prescription and all exercise goals during rehab session. The exercise was tolerated well and the patient is progressing in the program.    Dr. Emily Filbert is Medical Director for [place] and [facility].

## 2016-02-22 DIAGNOSIS — Z952 Presence of prosthetic heart valve: Secondary | ICD-10-CM

## 2016-02-22 NOTE — Progress Notes (Signed)
Daily Session Note  Patient Details  Name: FELIX PRATT MRN: [id number] Date of Birth: 1936/01/21 Referring Provider:    Encounter Date: 02/22/2016  Check In:     Session Check In - 02/22/16 1629    Check-In   Location [facility]   Staff Present Jeanell Sparrow, DPT, CEEA;Diane Joya Gaskins, RN, BSN;Laureen Owens Shark, BS, RRT, Respiratory Therapist   Supervising physician immediately available to respond to emergencies See telemetry face sheet for immediately available ER MD   Medication changes reported     No   Fall or balance concerns reported    No   Warm-up and Cool-down Performed on first and last piece of equipment   Resistance Training Performed Yes   VAD Patient? No         Goals Met:  Exercise tolerated well No report of cardiac concerns or symptoms  Goals Unmet:  Not Applicable  Comments: Patient completed exercise prescription and all exercise goals during rehab session. The exercise was tolerated well and the patient is progressing in the program.    Dr. Emily Filbert is Medical Director for [place] and [facility].

## 2016-02-23 ENCOUNTER — Encounter: Payer: PPO | Admitting: *Deleted

## 2016-02-23 DIAGNOSIS — Z952 Presence of prosthetic heart valve: Secondary | ICD-10-CM

## 2016-02-23 NOTE — Progress Notes (Signed)
Daily Session Note  Patient Details  Name: Kayla Gray MRN: [id number] Date of Birth: 10-28-1935 Referring Provider:    Encounter Date: 02/23/2016  Check In:     Session Check In - 02/23/16 1642    Check-In   Location [facility]   Staff Present Gerlene Burdock, RN, Alex Gardener, DPT, CEEA;Diane Joya Gaskins, RN, BSN   Supervising physician immediately available to respond to emergencies See telemetry face sheet for immediately available ER MD   Medication changes reported     No   Fall or balance concerns reported    No   Warm-up and Cool-down Performed on first and last piece of equipment   Resistance Training Performed Yes   VAD Patient? No   Pain Assessment   Currently in Pain? No/denies         Goals Met:  Proper associated with RPD/PD & O2 Sat Exercise tolerated well  Goals Unmet:  Not Applicable  Comments:     Dr. Emily Filbert is Medical Director for [place] and [facility].

## 2016-02-27 DIAGNOSIS — Z952 Presence of prosthetic heart valve: Secondary | ICD-10-CM

## 2016-02-27 NOTE — Progress Notes (Signed)
Daily Session Note  Patient Details  Name: Kayla Gray MRN: [id number] Date of Birth: 1936/08/08 Referring Provider:    Encounter Date: 02/27/2016  Check In:     Session Check In - 02/27/16 1600    Check-In   Location [facility]   Staff Present Heath Lark, RN, BSN, CCRP;Italo Banton, DPT, Ronaldo Miyamoto, BS, ACSM CEP, Exercise Physiologist   Supervising physician immediately available to respond to emergencies See telemetry face sheet for immediately available ER MD   Medication changes reported     No   Fall or balance concerns reported    No   Warm-up and Cool-down Performed on first and last piece of equipment   Resistance Training Performed Yes   VAD Patient? No         Goals Met:  Independence with exercise equipment Exercise tolerated well No report of cardiac concerns or symptoms  Goals Unmet:  Not Applicable  Comments: Patient completed exercise prescription and all exercise goals during rehab session. The exercise was tolerated well and the patient is progressing in the program.    Dr. Emily Filbert is Medical Director for [place] and [facility].

## 2016-02-29 ENCOUNTER — Ambulatory Visit (INDEPENDENT_AMBULATORY_CARE_PROVIDER_SITE_OTHER): Payer: PPO

## 2016-02-29 DIAGNOSIS — Z954 Presence of other heart-valve replacement: Secondary | ICD-10-CM | POA: Diagnosis not present

## 2016-02-29 DIAGNOSIS — Z953 Presence of xenogenic heart valve: Secondary | ICD-10-CM

## 2016-02-29 DIAGNOSIS — Z952 Presence of prosthetic heart valve: Secondary | ICD-10-CM

## 2016-02-29 DIAGNOSIS — Z5181 Encounter for therapeutic drug level monitoring: Secondary | ICD-10-CM

## 2016-02-29 DIAGNOSIS — I6310 Cerebral infarction due to embolism of unspecified precerebral artery: Secondary | ICD-10-CM | POA: Diagnosis not present

## 2016-02-29 DIAGNOSIS — I350 Nonrheumatic aortic (valve) stenosis: Secondary | ICD-10-CM | POA: Diagnosis not present

## 2016-02-29 LAB — POCT INR: INR: 2.4

## 2016-02-29 NOTE — Progress Notes (Signed)
Daily Session Note  Patient Details  Name: Kayla Gray MRN: [id number] Date of Birth: [birth year] Referring Provider:    Encounter Date: 02/29/2016  Check In:     Session Check In - 02/29/16 1606    Check-In   Location [facility]   Staff Present Gerlene Burdock, RN, Jana Half, RN, Alex Gardener, DPT, CEEA   Supervising physician immediately available to respond to emergencies See telemetry face sheet for immediately available ER MD   Medication changes reported     No   Fall or balance concerns reported    No   Warm-up and Cool-down Performed on first and last piece of equipment   Resistance Training Performed Yes   VAD Patient? No         Goals Met:  Independence with exercise equipment Exercise tolerated well No report of cardiac concerns or symptoms  Goals Unmet:  Not Applicable  Comments: Patient completed exercise prescription and all exercise goals during rehab session. The exercise was tolerated well and the patient is progressing in the program.    Dr. Emily Filbert is Medical Director for [place] and [facility].

## 2016-03-01 DIAGNOSIS — Z952 Presence of prosthetic heart valve: Secondary | ICD-10-CM

## 2016-03-01 NOTE — Progress Notes (Signed)
Daily Session Note  Patient Details  Name: Kayla Gray MRN: [id number] Date of Birth: 03/01/1936 Referring Provider:    Encounter Date: 03/01/2016  Check In:     Session Check In - 03/01/16 1628    Check-In   Location [facility]   Staff Present Gerlene Burdock, RN, Alex Gardener, DPT, CEEA;Diane Joya Gaskins, RN, BSN   Supervising physician immediately available to respond to emergencies See telemetry face sheet for immediately available ER MD   Medication changes reported     No   Fall or balance concerns reported    No   Warm-up and Cool-down Performed on first and last piece of equipment   Resistance Training Performed Yes   VAD Patient? No         Goals Met:  Independence with exercise equipment Exercise tolerated well No report of cardiac concerns or symptoms  Goals Unmet:  Not Applicable  Comments: A new target heart rate range for exercise was calculated based on the patient's ability to exercise with increased intensity. The range is 40-85% of HRR and encompasses moderate-vigorous exercise. The target heart rate range is equivalent to an RPE of 12-17.    Dr. Emily Filbert is Medical Director for [place] and [facility].

## 2016-03-04 ENCOUNTER — Encounter: Payer: Self-pay | Admitting: *Deleted

## 2016-03-04 DIAGNOSIS — Z952 Presence of prosthetic heart valve: Secondary | ICD-10-CM

## 2016-03-04 NOTE — Progress Notes (Signed)
Cardiac Individual Treatment Plan  Patient Details  Name: Kayla Gray MRN: [id number] Date of Birth: [birth year] Referring Provider:    Initial Encounter Date:       Cardiac Rehab from 01/18/2016 in [facility] Cardiac and Pulmonary Rehab   Date  01/18/16      Visit Diagnosis: S/P AVR (aortic valve replacement)  Patient's Home Medications on Admission:  Current outpatient prescriptions:  .  acetaminophen (TYLENOL) 325 MG tablet, Take 650 mg by mouth every 6 (six) hours as needed., Disp: , Rfl:  .  amiodarone (PACERONE) 200 MG tablet, Take 200 mg by mouth daily., Disp: , Rfl: 3 .  Calcium Carbonate-Vitamin D (CALCIUM 600 + D PO), Take by mouth 2 (two) times daily., Disp: , Rfl:  .  ezetimibe (ZETIA) 10 MG tablet, Take 10 mg by mouth daily., Disp: , Rfl:  .  fluticasone (FLONASE) 50 MCG/ACT nasal spray, Place 2 sprays into the nose daily as needed., Disp: , Rfl:  .  metoprolol tartrate (LOPRESSOR) 25 MG tablet, TAKE 1 TABLET TWICE DAILY, Disp: 60 tablet, Rfl: 3 .  Multiple Vitamin (MULTIVITAMIN) tablet, Take 1 tablet by mouth daily., Disp: , Rfl:  .  NON FORMULARY, at bedtime. CPAP, Disp: , Rfl:  .  pantoprazole (PROTONIX) 40 MG tablet, Take 40 mg by mouth daily. , Disp: , Rfl:  .  PARoxetine (PAXIL) 20 MG tablet, Take 20 mg by mouth daily., Disp: , Rfl:  .  warfarin (COUMADIN) 2.5 MG tablet, Take 1 tablet (2.5 mg total) by mouth daily at 6 PM. Or as directed by Dr. [name] (Patient taking differently: Take 2.5 mg by mouth daily. Or as directed by Dr. [name]), Disp: 30 tablet, Rfl: 1 .  warfarin (COUMADIN) 2.5 MG tablet, TAKE 1 TABLET (2.5 MG TOTAL) BY MOUTH DAILY AT 6 PM. OR AS DIRECTED BY DR. [name] (Patient not taking: Reported on 01/18/2016), Disp: 45 tablet, Rfl: 3 .  warfarin (COUMADIN) 2.5 MG tablet, Take 2.5 mg by mouth daily. Reported on 01/25/2016, Disp: , Rfl:   Past Medical History: Past Medical History  Diagnosis Date  . Obstructive sleep apnea   . Hyperlipidemia   . Depression    . Osteopenia   . Iron deficiency anemia   . Syncope and collapse   . Aortic stenosis   . Bilateral cataracts   . Diverticulosis   . Osteoarthritis   . Gastroesophageal reflux disease   . Peptic ulcer disease   . Urinary tract infection   . Heart murmur   . Breast cancer ([place])   . Supraventricular tachycardia ([place])   . Stroke ([place])     11/14/13  . Chronic diastolic (congestive) heart failure ([place])   . Bleeding nose     Thurs night (09/08/15) and Fri (09/09/15) left side.Bleeding didn't last long  . S/P aortic valve replacement with bioprosthetic valve 09/16/2015    23 mm [employer] Elite bioprosthetic tissue valve    Tobacco Use: History  Smoking status  . Never Smoker   Smokeless tobacco  . Not on file    Labs: Recent Review Flowsheet Data    Labs for ITP Cardiac and Pulmonary Rehab Latest Ref Rng 09/16/2015 09/16/2015 09/16/2015 09/17/2015 09/17/2015   PHART 7.350 - 7.450 7.271(L) 7.333(L) 7.305(L) 7.369 -   PCO2ART 35.0 - 45.0 mmHg 46.6(H) 43.5 43.2 44.4 -   HCO3 20.0 - 24.0 mEq/L 21.4 23.0 21.3 25.4(H) -   TCO2 0 - 100 mmol/L _0 ACIDBASEDEF 0.0 -  2.0 mmol/L 5.0(H) 3.0(H) 5.0(H) - -   O2SAT - 92.0 94.0 95.0 95.0 -       Exercise Target Goals:    Exercise Program Goal: Individual exercise prescription set with THRR, safety & activity barriers. Participant demonstrates ability to understand and report RPE using BORG scale, to self-measure pulse accurately, and to acknowledge the importance of the exercise prescription.  Exercise Prescription Goal: Starting with aerobic activity 30 plus minutes a day, 3 days per week for initial exercise prescription. Provide home exercise prescription and guidelines that participant acknowledges understanding prior to discharge.  Activity Barriers & Risk Stratification:     Activity Barriers & Cardiac Risk Stratification - 01/18/16 1524    Activity Barriers & Cardiac Risk Stratification   Activity Barriers Joint  Problems;Arthritis;Left Knee Replacement;Right Knee Replacement;Neck/Spine Problems;Back Problems;Deconditioning   Cardiac Risk Stratification High      6 Minute Walk:     6 Minute Walk      01/18/16 1608       6 Minute Walk   Phase Initial     Distance 850 feet     Walk Time 6 minutes     MPH 1.6     RPE 13     Resting HR 65 bpm     Resting BP 138/74 mmHg     Max Ex. HR 113 bpm     Max Ex. BP 144/76 mmHg        Initial Exercise Prescription:     Initial Exercise Prescription - 01/18/16 1600    Date of Initial Exercise RX and Referring Provider   Date 01/18/16   Treadmill   MPH 1.5   Grade 0   Minutes 15   Recumbant Bike   Level 2   RPM 40   Watts 20   Minutes 15   NuStep   Level 2   Watts 40   Minutes 15   Recumbant Elliptical   Level 2   RPM 40   Watts 20   Minutes 15   REL-XR   Level 2   Watts 50   Minutes 15   Biostep-RELP   Level 1   Watts 15   Minutes 15   Prescription Details   Frequency (times per week) 2-3   Duration Progress to 45 minutes of aerobic exercise without signs/symptoms of physical distress   Intensity   THRR REST +  30   Ratings of Perceived Exertion 11-15   Progression   Progression Continue to progress workloads to maintain intensity without signs/symptoms of physical distress.   Resistance Training   Training Prescription Yes   Weight 2   Reps 10-15      Perform Capillary Blood Glucose checks as needed.  Exercise Prescription Changes:     Exercise Prescription Changes      02/01/16 1600 02/02/16 1217 02/28/16 1600       Exercise Review   Progression  Yes Yes     Response to Exercise   Blood Pressure (Admit)  126/74 mmHg 142/74 mmHg     Blood Pressure (Exercise)  128/80 mmHg 134/64 mmHg     Blood Pressure (Exit)  122/82 mmHg 124/80 mmHg     Heart Rate (Admit)  65 bpm 64 bpm     Heart Rate (Exercise)  80 bpm 84 bpm     Heart Rate (Exit)  67 bpm 58 bpm     Rating of Perceived Exertion (Exercise)  12 12      Comments  Pat  attends Heart Track regularly and is progressing in duration and intensity.  Recently, she experienced a flare-up of her back pain and exercise modifications were made to accommodate her discomfort.      Duration  Progress to 45 minutes of aerobic exercise without signs/symptoms of physical distress Progress to 45 minutes of aerobic exercise without signs/symptoms of physical distress     Intensity  Rest + 30 THRR New  40-80 % THRR 57-114     Resistance Training   Training Prescription Yes Yes Yes     Weight 2 2 no     Reps 10-15 10-15 10-15     Interval Training   Interval Training  No      Treadmill   MPH 1.5       Grade 0       Minutes 15       Recumbant Bike   Level 2       RPM 40       Watts 20       Minutes 15       NuStep   Level '3 3 3     '$ Watts 40 34 45     Minutes '15 15 15     '$ Recumbant Elliptical   Level 2       RPM 40       Watts 20       Minutes 15       REL-XR   Level 2       Watts 50       Minutes 15       Biostep-RELP   Level '1 2 3     '$ Watts '15 15 15     '$ Minutes '15 15 15        '$ Exercise Comments:     Exercise Comments      02/02/16 1705 02/03/16 1222 02/28/16 1641       Exercise Comments Pat cam e in today stating her back pain has increased and she ,may need to see her doctor about the cortisone shots. After reviewing her ex prescription with Jacqlyn Larsen, it was decided she will not work on the XR Ellipitcal today, but try the Biostep recumbent ellipitical.  At the end of the session she was not feeling any increased pain. Goal is for Pat to continue to exercise with necessary modifications in order to improve cardiovascular tolerance and avoid exacerbation of back pain. [name] attends Heart Track regularly and is progressing in tolerance to exercise.  She is limited mainly by occasional flare-ups of back pain and avoids equipment which exacerbates it.        Discharge Exercise Prescription (Final Exercise Prescription Changes):      Exercise Prescription Changes - 02/28/16 1600    Exercise Review   Progression Yes   Response to Exercise   Blood Pressure (Admit) 142/74 mmHg   Blood Pressure (Exercise) 134/64 mmHg   Blood Pressure (Exit) 124/80 mmHg   Heart Rate (Admit) 64 bpm   Heart Rate (Exercise) 84 bpm   Heart Rate (Exit) 58 bpm   Rating of Perceived Exertion (Exercise) 12   Duration Progress to 45 minutes of aerobic exercise without signs/symptoms of physical distress   Intensity THRR New  40-80 % THRR 57-114   Resistance Training   Training Prescription Yes   Weight no   Reps 10-15   NuStep   Level 3   Watts 45   Minutes 15   Biostep-RELP   Level 3  Watts 15   Minutes 15      Nutrition:  Target Goals: Understanding of nutrition guidelines, daily intake of sodium '1500mg'$ , cholesterol '200mg'$ , calories 30% from fat and 7% or less from saturated fats, daily to have 5 or more servings of fruits and vegetables.  Biometrics:     Pre Biometrics - 01/18/16 1613    Pre Biometrics   Height '5\' 2"'$  (1.575 m)   Weight 163 lb 12.8 oz (74.299 kg)   Waist Circumference 36.75 inches   Hip Circumference 43.5 inches   Waist to Hip Ratio 0.84 %   BMI (Calculated) 30       Nutrition Therapy Plan and Nutrition Goals:     Nutrition Therapy & Goals - 01/26/16 1723    Nutrition Therapy   Drug/Food Interactions Coumadin/Vit K   Intervention Plan   Intervention Prescribe, educate and counsel regarding individualized specific dietary modifications aiming towards targeted core components such as weight, hypertension, lipid management, diabetes, heart failure and other comorbidities.   Expected Outcomes Short Term Goal: Understand basic principles of dietary content, such as calories, fat, sodium, cholesterol and nutrients.;Long Term Goal: Adherence to prescribed nutrition plan.      Nutrition Discharge: Rate Your Plate Scores:     Nutrition Assessments - 01/26/16 1606    Rate Your Plate Scores   Pre  Score 61   Pre Score % 68 %      Nutrition Goals Re-Evaluation:   Psychosocial: Target Goals: Acknowledge presence or absence of depression, maximize coping skills, provide positive support system. Participant is able to verbalize types and ability to use techniques and skills needed for reducing stress and depression.  Initial Review & Psychosocial Screening:     Initial Psych Review & Screening - 01/18/16 1815    Family Dynamics   Comments Pat scored 14 on her PHQ-9.  [name] has been on Paxil since the year 2000.  [name] has never seen a counselor or therapist.  Patient has good support system in place.  [name] is married; described her husband as being very supportive; has one son and two daughers; 5 grandchildren and 1 great grandchild.  [name] attends [employer].  Viviana Simpler, patient's daughter is her 88.     Screening Interventions   Interventions Encouraged to exercise;Program counselor consult      Quality of Life Scores:     Quality of Life - 01/18/16 1818    Quality of Life Scores   Health/Function Pre 24.33 %   Socioeconomic Pre 29.5 %   Psych/Spiritual Pre 28.29 %   Family Pre 30 %   GLOBAL Pre 26.89 %      PHQ-9:     Recent Review Flowsheet Data    Depression screen [facility] 2/9 01/18/2016   Decreased Interest 2   Down, Depressed, Hopeless 1   PHQ - 2 Score 3   Altered sleeping 3   Tired, decreased energy 3   Change in appetite 3   Feeling bad or failure about yourself  2   Trouble concentrating 0   Moving slowly or fidgety/restless 0   Suicidal thoughts 0   PHQ-9 Score 14   Difficult doing work/chores Not difficult at all      Psychosocial Evaluation and Intervention:     Psychosocial Evaluation - 01/25/16 1647    Psychosocial Evaluation & Interventions   Interventions Encouraged to exercise with the program and follow exercise prescription;Relaxation education;Stress management education   Comments Counselor met with Ms. Knauff today for  initial psychosocial evaluation.   She is a 80 year old who had valve replacement 4 months ago.  She has a strong support system with a spouse of over 6 years and (3) adult children who live close by, as well as active involvement in her local church.  Ms. Tague has several other health issues including knee replacements and severe arthritis.  She also is a breast cancer survivor for 17 years.  Ms. Santarelli denies a history of depression or anxiety but reports she has been on Paxil since the breast cancer in 2000.  Although her PHQ-9 score was 14, she reports that her mood is typically positive, except for during the winter months when she notices more sadness.  Ms. Villers states that she has minimal stress in her life currently, other than a disabled daughter who just got out of the hopsital with Pneumonia.  Ms. Carrol has goals to walk better with increased balance, to increase her energy, and to be educated on ways to eat more healthfully.  She plans to walk outside to maintain consistency in exercise once she completes this program.     Continued Psychosocial Services Needed Yes  Ms. Fullwood will benefit from consistent exercise; meeting with the dietician for her healthy eating goal; and participate in the psychoeducational components of this program, especially on depression.      Psychosocial Re-Evaluation:     Psychosocial Re-Evaluation      02/22/16 1654 02/29/16 1657         Psychosocial Re-Evaluation   Comments Follow up with Ms. Kendra reporting she is feeling stronger since she began this program and has noticed an improvement in her sleep at night as well.  Counselor will continue to follow with Ms. Fill as needed.   Follow up with Ms. Neiswonger today reporting she continues to feel an increase in stamina and strength.  However, her balance continues to be a problem.  She hopes to address this further with an additional medical provider.  Ms. Mihelich reports her mood has improved as well since  beginning this program as she has energy to do more now and that helps.  She states her daughter's health issues have improved which also contributes to less stress in her life.  Counselor will continue to follow with Ms. Radebaugh in the future.           Vocational Rehabilitation: Provide vocational rehab assistance to qualifying candidates.   Vocational Rehab Evaluation & Intervention:     Vocational Rehab - 01/18/16 1527    Initial Vocational Rehab Evaluation & Intervention   Assessment shows need for Vocational Rehabilitation No      Education: Education Goals: Education classes will be provided on a weekly basis, covering required topics. Participant will state understanding/return demonstration of topics presented.  Learning Barriers/Preferences:     Learning Barriers/Preferences - 01/18/16 1524    Learning Barriers/Preferences   Learning Barriers Exercise Concerns   Learning Preferences None      Education Topics: General Nutrition Guidelines/Fats and Fiber: -Group instruction provided by verbal, written material, models and posters to present the general guidelines for heart healthy nutrition. Gives an explanation and review of dietary fats and fiber.   Controlling Sodium/Reading Food Labels: -Group verbal and written material supporting the discussion of sodium use in heart healthy nutrition. Review and explanation with models, verbal and written materials for utilization of the food label.          Cardiac Rehab from 02/29/2016 in [facility] Cardiac  and Pulmonary Rehab   Date  01/23/16   Educator  PI   Instruction Review Code  2- meets goals/outcomes      Exercise Physiology & Risk Factors: - Group verbal and written instruction with models to review the exercise physiology of the cardiovascular system and associated critical values. Details cardiovascular disease risk factors and the goals associated with each risk factor.      Cardiac Rehab from 02/29/2016 in [facility]  Cardiac and Pulmonary Rehab   Date  02/01/16   Educator  Harl Bowie   Instruction Review Code  2- meets goals/outcomes      Aerobic Exercise & Resistance Training: - Gives group verbal and written discussion on the health impact of inactivity. On the components of aerobic and resistive training programs and the benefits of this training and how to safely progress through these programs.      Cardiac Rehab from 02/29/2016 in [facility] Cardiac and Pulmonary Rehab   Date  02/06/16   Educator  BS   Instruction Review Code  2- meets goals/outcomes      Flexibility, Balance, General Exercise Guidelines: - Provides group verbal and written instruction on the benefits of flexibility and balance training programs. Provides general exercise guidelines with specific guidelines to those with heart or lung disease. Demonstration and skill practice provided.      Cardiac Rehab from 02/29/2016 in [facility] Cardiac and Pulmonary Rehab   Date  02/08/16   Educator  S. Bice, RN   Instruction Review Code  2- meets goals/outcomes      Stress Management: - Provides group verbal and written instruction about the health risks of elevated stress, cause of high stress, and healthy ways to reduce stress.      Cardiac Rehab from 02/29/2016 in [facility] Cardiac and Pulmonary Rehab   Date  02/15/16   Educator  Heloise Ochoa, MSW   Instruction Review Code  2- meets goals/outcomes      Depression: - Provides group verbal and written instruction on the correlation between heart/lung disease and depressed mood, treatment options, and the stigmas associated with seeking treatment.   Anatomy & Physiology of the Heart: - Group verbal and written instruction and models provide basic cardiac anatomy and physiology, with the coronary electrical and arterial systems. Review of: AMI, Angina, Valve disease, Heart Failure, Cardiac Arrhythmia, Pacemakers, and the ICD.   Cardiac Procedures: - Group verbal and written instruction  and models to describe the testing methods done to diagnose heart disease. Reviews the outcomes of the test results. Describes the treatment choices: Medical Management, Angioplasty, or Coronary Bypass Surgery.      Cardiac Rehab from 02/29/2016 in [facility] Cardiac and Pulmonary Rehab   Date  02/20/16   Educator  SB   Instruction Review Code  2- meets goals/outcomes      Cardiac Medications: - Group verbal and written instruction to review commonly prescribed medications for heart disease. Reviews the medication, class of the drug, and side effects. Includes the steps to properly store meds and maintain the prescription regimen.      Cardiac Rehab from 02/29/2016 in [facility] Cardiac and Pulmonary Rehab   Date  02/29/16   Educator  DW   Instruction Review Code  2- meets goals/outcomes      Go Sex-Intimacy & Heart Disease, Get SMART - Goal Setting: - Group verbal and written instruction through game format to discuss heart disease and the return to sexual intimacy. Provides group verbal and written material to discuss and apply  goal setting through the application of the S.M.A.R.T. Method.      Cardiac Rehab from 02/29/2016 in [facility] Cardiac and Pulmonary Rehab   Date  02/20/16   Educator  SB   Instruction Review Code  2- meets goals/outcomes      Other Matters of the Heart: - Provides group verbal, written materials and models to describe Heart Failure, Angina, Valve Disease, and Diabetes in the realm of heart disease. Includes description of the disease process and treatment options available to the cardiac patient.      Cardiac Rehab from 02/29/2016 in [facility] Cardiac and Pulmonary Rehab   Date  01/30/16   Educator  SB   Instruction Review Code  2- meets goals/outcomes      Exercise & Equipment Safety: - Individual verbal instruction and demonstration of equipment use and safety with use of the equipment.      Cardiac Rehab from 01/18/2016 in [facility] Cardiac and Pulmonary Rehab   Date  01/18/16    Educator  D. Joya Gaskins, RN   Instruction Review Code  1- partially meets, needs review/practice      Infection Prevention: - Provides verbal and written material to individual with discussion of infection control including proper hand washing and proper equipment cleaning during exercise session.      Cardiac Rehab from 02/29/2016 in [facility] Cardiac and Pulmonary Rehab   Date  01/18/16   Educator  D. Joya Gaskins, RN   Instruction Review Code  2- meets goals/outcomes      Falls Prevention: - Provides verbal and written material to individual with discussion of falls prevention and safety.      Cardiac Rehab from 02/29/2016 in [facility] Cardiac and Pulmonary Rehab   Date  01/18/16   Educator  D. Joya Gaskins, RN   Instruction Review Code  2- meets goals/outcomes      Diabetes: - Individual verbal and written instruction to review signs/symptoms of diabetes, desired ranges of glucose level fasting, after meals and with exercise. Advice that pre and post exercise glucose checks will be done for 3 sessions at entry of program.    Knowledge Questionnaire Score:     Knowledge Questionnaire Score - 01/18/16 1525    Knowledge Questionnaire Score   Pre Score 22/28      Core Components/Risk Factors/Patient Goals at Admission:     Personal Goals and Risk Factors at Admission - 01/18/16 1529    Core Components/Risk Factors/Patient Goals on Admission   Sedentary Yes   Intervention Provide advice, education, support and counseling about physical activity/exercise needs.;Develop an individualized exercise prescription for aerobic and resistive training based on initial evaluation findings, risk stratification, comorbidities and participant's personal goals.   Expected Outcomes Achievement of increased cardiorespiratory fitness and enhanced flexibility, muscular endurance and strength shown through measurements of functional capacity and personal statement of participant.   Increase Strength and Stamina Yes    Intervention Provide advice, education, support and counseling about physical activity/exercise needs.;Develop an individualized exercise prescription for aerobic and resistive training based on initial evaluation findings, risk stratification, comorbidities and participant's personal goals.   Expected Outcomes Achievement of increased cardiorespiratory fitness and enhanced flexibility, muscular endurance and strength shown through measurements of functional capacity and personal statement of participant.   Hypertension Yes   Intervention Provide education on lifestyle modifcations including regular physical activity/exercise, weight management, moderate sodium restriction and increased consumption of fresh fruit, vegetables, and low fat dairy, alcohol moderation, and smoking cessation.;Monitor prescription use compliance.   Expected Outcomes Short Term:  Continued assessment and intervention until BP is < 140/37m HG in hypertensive participants. < 130/872mHG in hypertensive participants with diabetes, heart failure or chronic kidney disease.;Long Term: Maintenance of blood pressure at goal levels.   Lipids Yes   Intervention Provide education and support for participant on nutrition & aerobic/resistive exercise along with prescribed medications to achieve LDL '70mg'$ , HDL >'40mg'$ .   Expected Outcomes Short Term: Participant states understanding of desired cholesterol values and is compliant with medications prescribed. Participant is following exercise prescription and nutrition guidelines.;Long Term: Cholesterol controlled with medications as prescribed, with individualized exercise RX and with personalized nutrition plan. Value goals: LDL < '70mg'$ , HDL > 40 mg.      Core Components/Risk Factors/Patient Goals Review:      Goals and Risk Factor Review      02/09/16 1734           Core Components/Risk Factors/Patient Goals Review   Personal Goals Review Sedentary;Increase Strength and  Stamina;Lipids;Hypertension       Review PaFraser Dintated she cannot tell of any specific changes in her stamina or strength from participating in [employer] her DDD and spinal stenosis limit her ability to increase her work load and aerobic activity.  PaFraser Dinid state she is sleeping better since exercising on a regular basis at Cardiac Rehab.  Cholesterol is controlled with current meds.  PaFraser Dinas not had cholesterol checked since starting Cardiac Rehab.  HTN:  BP upon check in has been ranging 126/70 - 140/80.  Upon check out BP has been 122/82 - 156/84.  Pat wanted to try the treadmill today.  She was on it for 2 minutes at a speed of 0.5 mph and had to get off as walking of  TM, as walking on TM was causing right knee pain according to patient.  Pat unable to take nonsteroidal pain meds due to being on blood thinner.  PaFraser Dinakes tylenol which doesn't help her DDD nor her right knee discomfort.  PaFraser Dinas completed 15 sessions for Cardiac Rehab thus far.            Core Components/Risk Factors/Patient Goals at Discharge (Final Review):      Goals and Risk Factor Review - 02/09/16 1734    Core Components/Risk Factors/Patient Goals Review   Personal Goals Review Sedentary;Increase Strength and Stamina;Lipids;Hypertension   Review PaFraser Dintated she cannot tell of any specific changes in her stamina or strength from participating in [place] her DDD and spinal stenosis limit her ability to increase her work load and aerobic activity.  PaFraser Dinid state she is sleeping better since exercising on a regular basis at Cardiac Rehab.  Cholesterol is controlled with current meds.  PaFraser Dinas not had cholesterol checked since starting Cardiac Rehab.  HTN:  BP upon check in has been ranging 126/70 - 140/80.  Upon check out BP has been 122/82 - 156/84.  Pat wanted to try the treadmill today.  She was on it for 2 minutes at a speed of 0.5 mph and had to get off as walking of  TM, as walking on TM was causing right knee  pain according to patient.  Pat unable to take nonsteroidal pain meds due to being on blood thinner.  PaFraser Dinakes tylenol which doesn't help her DDD nor her right knee discomfort.  PaFraser Dinas completed 15 sessions for Cardiac Rehab thus far.        ITP Comments:     ITP Comments  01/26/16 1715 02/05/16 1040 03/04/16 1047       ITP Comments Pat reports that she is tired at times but overall she feels better. [name] said that she goes to her doctor every 6 months. She said she is doing well on her cholestrol medicine and her blood work gets checked yearly. Her blood pressure is very good.  30 Day review. Continue with ITP  six visits since orientation 30 day review. Continue with ITP        Comments:

## 2016-03-05 DIAGNOSIS — Z952 Presence of prosthetic heart valve: Secondary | ICD-10-CM

## 2016-03-05 NOTE — Progress Notes (Signed)
Daily Session Note  Patient Details  Name: Kayla Gray MRN: [id number] Date of Birth: [birth year] Referring Provider:    Encounter Date: 03/05/2016  Check In:     Session Check In - 03/05/16 1605    Check-In   Location [facility]   Staff Present Roanna Epley, RN, Vickki Hearing, BA, ACSM CEP, Exercise Physiologist;Kelly Amedeo Plenty, BS, ACSM CEP, Exercise Physiologist;Krystin Keeven Brayton El, DPT, CEEA   Supervising physician immediately available to respond to emergencies See telemetry face sheet for immediately available ER MD   Medication changes reported     No   Fall or balance concerns reported    No   Warm-up and Cool-down Performed on first and last piece of equipment   Resistance Training Performed Yes   VAD Patient? No         Goals Met:  Independence with exercise equipment Exercise tolerated well No report of cardiac concerns or symptoms  Goals Unmet:  Not Applicable  Comments: Patient completed exercise prescription and all exercise goals during rehab session. The exercise was tolerated well and the patient is progressing in the program.    Dr. Emily Filbert is Medical Director for [facility] and [facility].

## 2016-03-07 ENCOUNTER — Encounter: Payer: PPO | Admitting: *Deleted

## 2016-03-07 VITALS — Ht 62.0 in | Wt 167.7 lb

## 2016-03-07 DIAGNOSIS — Z952 Presence of prosthetic heart valve: Secondary | ICD-10-CM

## 2016-03-07 NOTE — Progress Notes (Signed)
Daily Session Note  Patient Details  Name: Kayla Gray MRN: [id number] Date of Birth: October 04, 1936 Referring Provider:    Encounter Date: 03/07/2016  Check In:     Session Check In - 03/07/16 1618    Check-In   Location [facility]   Staff Present Nada Maclachlan, BA, ACSM CEP, Exercise Physiologist;Carroll Enterkin, RN, Jana Half, RN, Alex Gardener, DPT, CEEA   Supervising physician immediately available to respond to emergencies See telemetry face sheet for immediately available ER MD   Medication changes reported     No   Fall or balance concerns reported    Yes   Warm-up and Cool-down Performed on first and last piece of equipment   Resistance Training Performed Yes   VAD Patient? No   Pain Assessment   Currently in Pain? No/denies         Goals Met:  Independence with exercise equipment Exercise tolerated well No report of cardiac concerns or symptoms Strength training completed today  Goals Unmet:  Not Applicable  Comments:  Patient completed exercise prescription and all exercise goals during rehab session. The exercise was tolerated well and the patient is progressing in the program.     Dr. Emily Filbert is Medical Director for [facility] and [facility].

## 2016-03-08 DIAGNOSIS — Z952 Presence of prosthetic heart valve: Secondary | ICD-10-CM | POA: Diagnosis not present

## 2016-03-08 NOTE — Progress Notes (Signed)
Daily Session Note  Patient Details  Name: Kayla Gray MRN: [id number] Date of Birth: 01-Dec-1935 Referring Provider:    Encounter Date: 03/08/2016  Check In:     Session Check In - 03/08/16 1626    Check-In   Location [facility]   Staff Present Gerlene Burdock, RN, BSN;Fransisca Shawn, DPT, Burlene Arnt, BA, ACSM CEP, Exercise Physiologist;Diane Joya Gaskins, RN, BSN   Supervising physician immediately available to respond to emergencies See telemetry face sheet for immediately available ER MD   Medication changes reported     No   Fall or balance concerns reported    No   Warm-up and Cool-down Performed on first and last piece of equipment   Resistance Training Performed Yes   VAD Patient? No   Pain Assessment   Currently in Pain? No/denies   Multiple Pain Sites No         Goals Met:  Independence with exercise equipment Exercise tolerated well No report of cardiac concerns or symptoms  Goals Unmet:  Not Applicable  Comments: Patient completed exercise prescription and all exercise goals during rehab session. The exercise was tolerated well and the patient is progressing in the program.    Dr. Emily Filbert is Medical Director for [facility].

## 2016-03-11 ENCOUNTER — Other Ambulatory Visit: Payer: Self-pay | Admitting: Cardiovascular Disease

## 2016-03-13 NOTE — Telephone Encounter (Signed)
Review for refill, Thank you. 

## 2016-03-14 ENCOUNTER — Other Ambulatory Visit: Payer: Self-pay | Admitting: Family Medicine

## 2016-03-14 ENCOUNTER — Encounter: Payer: PPO | Admitting: *Deleted

## 2016-03-14 DIAGNOSIS — Z952 Presence of prosthetic heart valve: Secondary | ICD-10-CM

## 2016-03-14 DIAGNOSIS — Z1231 Encounter for screening mammogram for malignant neoplasm of breast: Secondary | ICD-10-CM

## 2016-03-14 NOTE — Progress Notes (Signed)
Daily Session Note  Patient Details  Name: TONEKA FULLEN MRN: [id number] Date of Birth: [birth year] Referring Provider:    Encounter Date: 03/14/2016  Check In:     Session Check In - 03/14/16 1613    Check-In   Location [facility]   Staff Present Gerlene Burdock, RN, Vickki Hearing, BA, ACSM CEP, Exercise Physiologist;Deanthony Maull Joya Gaskins, RN, BSN   Supervising physician immediately available to respond to emergencies See telemetry face sheet for immediately available ER MD   Medication changes reported     No   Fall or balance concerns reported    No   Warm-up and Cool-down Performed on first and last piece of equipment   Resistance Training Performed Yes   VAD Patient? No   Pain Assessment   Currently in Pain? No/denies         Goals Met:  Independence with exercise equipment Exercise tolerated well No report of cardiac concerns or symptoms Strength training completed today  Goals Unmet:  Not Applicable  Comments: Pt able to follow exercise prescription today without complaint.  Will continue to monitor for progression.    Dr. Emily Filbert is Medical Director for [place] [facility].

## 2016-03-15 ENCOUNTER — Encounter: Payer: PPO | Attending: Cardiovascular Disease

## 2016-03-15 DIAGNOSIS — Z952 Presence of prosthetic heart valve: Secondary | ICD-10-CM | POA: Diagnosis not present

## 2016-03-15 NOTE — Patient Instructions (Signed)
Discharge Instructions  Patient Details  Name: Kayla Gray MRN: [id number] Date of Birth: [birth year] Referring Provider:  Minna Merritts, MD   Number of Visits:   Reason for Discharge:  Patient reached a stable level of exercise. Patient independent in their exercise.  Smoking History:  History  Smoking status  . Never Smoker   Smokeless tobacco  . Not on file    Diagnosis:  S/P AVR (aortic valve replacement)  Initial Exercise Prescription:     Initial Exercise Prescription - 01/18/16 1600    Date of Initial Exercise RX and Referring Provider   Date 01/18/16   Treadmill   MPH 1.5   Grade 0   Minutes 15   Recumbant Bike   Level 2   RPM 40   Watts 20   Minutes 15   NuStep   Level 2   Watts 40   Minutes 15   Recumbant Elliptical   Level 2   RPM 40   Watts 20   Minutes 15   REL-XR   Level 2   Watts 50   Minutes 15   Biostep-RELP   Level 1   Watts 15   Minutes 15   Prescription Details   Frequency (times per week) 2-3   Duration Progress to 45 minutes of aerobic exercise without signs/symptoms of physical distress   Intensity   THRR REST +  30   Ratings of Perceived Exertion 11-15   Progression   Progression Continue to progress workloads to maintain intensity without signs/symptoms of physical distress.   Resistance Training   Training Prescription Yes   Weight 2   Reps 10-15      Discharge Exercise Prescription (Final Exercise Prescription Changes):     Exercise Prescription Changes - 03/15/16 1600    Exercise Review   Progression Yes   Response to Exercise   Rating of Perceived Exertion (Exercise) 12   Duration Progress to 45 minutes of aerobic exercise without signs/symptoms of physical distress   Intensity THRR New  40-80 % THRR 57-114   Resistance Training   Training Prescription Yes   Weight no   Reps 10-15   NuStep   Level 3   Watts 45   Minutes 15   Biostep-RELP   Level 3   Watts 15   Minutes 15   Home Exercise  Plan   Plans to continue exercise at [employer] and [name] spoke with Kayla Gray about [employer]       Functional Capacity:     6 Minute Walk      01/18/16 1608 03/07/16 1654     6 Minute Walk   Phase Initial Discharge    Distance 850 feet 1030 feet    Distance % Change  21 %    Walk Time 6 minutes 6 minutes    # of Rest Breaks  0    MPH 1.6     RPE 13 13    Resting HR 65 bpm 71 bpm    Resting BP 138/74 mmHg 132/70 mmHg    Max Ex. HR 113 bpm 91 bpm    Max Ex. BP 144/76 mmHg 162/74 mmHg       Quality of Life:     Quality of Life - 03/14/16 1658    Quality of Life Scores   Health/Function Post 23.83 %   Socioeconomic Post 25.42 %   Psych/Spiritual Post 29.14 %   Family Post 30 %   GLOBAL Post  26.18 %      Personal Goals: Goals established at orientation with interventions provided to work toward goal.     Personal Goals and Risk Factors at Admission - 01/18/16 1529    Core Components/Risk Factors/Patient Goals on Admission   Sedentary Yes   Intervention Provide advice, education, support and counseling about physical activity/exercise needs.;Develop an individualized exercise prescription for aerobic and resistive training based on initial evaluation findings, risk stratification, comorbidities and participant's personal goals.   Expected Outcomes Achievement of increased cardiorespiratory fitness and enhanced flexibility, muscular endurance and strength shown through measurements of functional capacity and personal statement of participant.   Increase Strength and Stamina Yes   Intervention Provide advice, education, support and counseling about physical activity/exercise needs.;Develop an individualized exercise prescription for aerobic and resistive training based on initial evaluation findings, risk stratification, comorbidities and participant's personal goals.   Expected Outcomes Achievement of increased cardiorespiratory fitness and enhanced flexibility,  muscular endurance and strength shown through measurements of functional capacity and personal statement of participant.   Hypertension Yes   Intervention Provide education on lifestyle modifcations including regular physical activity/exercise, weight management, moderate sodium restriction and increased consumption of fresh fruit, vegetables, and low fat dairy, alcohol moderation, and smoking cessation.;Monitor prescription use compliance.   Expected Outcomes Short Term: Continued assessment and intervention until BP is < 140/23mm HG in hypertensive participants. < 130/17mm HG in hypertensive participants with diabetes, heart failure or chronic kidney disease.;Long Term: Maintenance of blood pressure at goal levels.   Lipids Yes   Intervention Provide education and support for participant on nutrition & aerobic/resistive exercise along with prescribed medications to achieve LDL 70mg , HDL >40mg .   Expected Outcomes Short Term: Participant states understanding of desired cholesterol values and is compliant with medications prescribed. Participant is following exercise prescription and nutrition guidelines.;Long Term: Cholesterol controlled with medications as prescribed, with individualized exercise RX and with personalized nutrition plan. Value goals: LDL < 70mg , HDL > 40 mg.       Personal Goals Discharge:     Goals and Risk Factor Review - 02/09/16 1734    Core Components/Risk Factors/Patient Goals Review   Personal Goals Review Sedentary;Increase Strength and Stamina;Lipids;Hypertension   Review Kayla Gray stated she cannot tell of any specific changes in her stamina or strength from participating in [place], as her DDD and spinal stenosis limit her ability to increase her work load and aerobic activity.  Kayla Gray did state she is sleeping better since exercising on a regular basis at Cardiac Rehab.  Cholesterol is controlled with current meds.  Kayla Gray has not had cholesterol checked since starting  Cardiac Rehab.  HTN:  BP upon check in has been ranging 126/70 - 140/80.  Upon check out BP has been 122/82 - 156/84.  Pat wanted to try the treadmill today.  She was on it for 2 minutes at a speed of 0.5 mph and had to get off as walking of  TM, as walking on TM was causing right knee pain according to patient.  Pat unable to take nonsteroidal pain meds due to being on blood thinner.  Kayla Gray takes tylenol which doesn't help her DDD nor her right knee discomfort.  Kayla Gray has completed 15 sessions for Cardiac Rehab thus far.        Nutrition & Weight - Outcomes:     Pre Biometrics - 01/18/16 1613    Pre Biometrics   Height 5\' 2"  (1.575 m)   Weight 163 lb 12.8 oz (74.299 kg)  Waist Circumference 36.75 inches   Hip Circumference 43.5 inches   Waist to Hip Ratio 0.84 %   BMI (Calculated) 30         Post Biometrics - 03/07/16 1653     Post  Biometrics   Height 5\' 2"  (1.575 m)   Weight 167 lb 11.2 oz (76.068 kg)   Waist Circumference 36.75 inches   Hip Circumference 44 inches   Waist to Hip Ratio 0.84 %   BMI (Calculated) 30.7      Nutrition:     Nutrition Therapy & Goals - 01/26/16 1723    Nutrition Therapy   Drug/Food Interactions Coumadin/Vit K   Intervention Plan   Intervention Prescribe, educate and counsel regarding individualized specific dietary modifications aiming towards targeted core components such as weight, hypertension, lipid management, diabetes, heart failure and other comorbidities.   Expected Outcomes Short Term Goal: Understand basic principles of dietary content, such as calories, fat, sodium, cholesterol and nutrients.;Long Term Goal: Adherence to prescribed nutrition plan.      Nutrition Discharge:     Nutrition Assessments - 03/14/16 1657    Rate Your Plate Scores   Post Score 59   Post Score % 65.55 %      Education Questionnaire Score:     Knowledge Questionnaire Score - 03/14/16 1658    Knowledge Questionnaire Score   Post Score 24       Goals reviewed with patient; copy given to patient.

## 2016-03-15 NOTE — Progress Notes (Signed)
Daily Session Note  Patient Details  Name: Kayla Gray MRN: [id number] Date of Birth: 1935-12-06 Referring Provider:    Encounter Date: 03/15/2016  Check In:     Session Check In - 03/15/16 1625    Check-In   Location [facility]   Staff Present Gerlene Burdock, RN, Vickki Hearing, BA, ACSM CEP, Exercise Physiologist;Diane Joya Gaskins, RN, BSN   Supervising physician immediately available to respond to emergencies See telemetry face sheet for immediately available ER MD   Medication changes reported     No   Fall or balance concerns reported    No   Warm-up and Cool-down Performed on first and last piece of equipment   Resistance Training Performed Yes   VAD Patient? No   Pain Assessment   Currently in Pain? No/denies         Goals Met:  Independence with exercise equipment Exercise tolerated well Personal goals reviewed No report of cardiac concerns or symptoms Strength training completed today  Goals Unmet:  Not Applicable  Comments: Reviewed individual exercise prescription with patient.  Changes were noted and patient was able to perform at new work load without signs or symptoms.    Dr. Emily Filbert is Medical Director for [facility] and [facility].

## 2016-03-15 NOTE — Progress Notes (Signed)
Cardiac Individual Treatment Plan  Patient Details  Name: Kayla Gray MRN: [id number] Date of Birth: Mar 06, 1936 Referring Provider:    Initial Encounter Date:       Cardiac Rehab from 01/18/2016 in [facility] Cardiac and Pulmonary Rehab   Date  01/18/16      Visit Diagnosis: S/P AVR (aortic valve replacement)  Patient's Home Medications on Admission:  Current outpatient prescriptions:  .  acetaminophen (TYLENOL) 325 MG tablet, Take 650 mg by mouth every 6 (six) hours as needed., Disp: , Rfl:  .  amiodarone (PACERONE) 200 MG tablet, Take 200 mg by mouth daily., Disp: , Rfl: 3 .  Calcium Carbonate-Vitamin D (CALCIUM 600 + D PO), Take by mouth 2 (two) times daily., Disp: , Rfl:  .  ezetimibe (ZETIA) 10 MG tablet, Take 10 mg by mouth daily., Disp: , Rfl:  .  fluticasone (FLONASE) 50 MCG/ACT nasal spray, Place 2 sprays into the nose daily as needed., Disp: , Rfl:  .  metoprolol tartrate (LOPRESSOR) 25 MG tablet, TAKE 1 TABLET TWICE DAILY, Disp: 60 tablet, Rfl: 3 .  Multiple Vitamin (MULTIVITAMIN) tablet, Take 1 tablet by mouth daily., Disp: , Rfl:  .  NON FORMULARY, at bedtime. CPAP, Disp: , Rfl:  .  pantoprazole (PROTONIX) 40 MG tablet, Take 40 mg by mouth daily. , Disp: , Rfl:  .  PARoxetine (PAXIL) 20 MG tablet, Take 20 mg by mouth daily., Disp: , Rfl:  .  warfarin (COUMADIN) 2.5 MG tablet, Take 1 tablet (2.5 mg total) by mouth daily at 6 PM. Or as directed by Dr. [name] (Patient taking differently: Take 2.5 mg by mouth daily. Or as directed by Dr. [name]), Disp: 30 tablet, Rfl: 1 .  warfarin (COUMADIN) 2.5 MG tablet, Take 2.5 mg by mouth daily. Reported on 01/25/2016, Disp: , Rfl:  .  warfarin (COUMADIN) 2.5 MG tablet, TAKE 1 TABLET (2.5 MG TOTAL) BY MOUTH DAILY AT 6 PM. OR AS DIRECTED BY DR. [name], Disp: 55 tablet, Rfl: 3  Past Medical History: Past Medical History  Diagnosis Date  . Obstructive sleep apnea   . Hyperlipidemia   . Depression   . Osteopenia   . Iron deficiency anemia    . Syncope and collapse   . Aortic stenosis   . Bilateral cataracts   . Diverticulosis   . Osteoarthritis   . Gastroesophageal reflux disease   . Peptic ulcer disease   . Urinary tract infection   . Heart murmur   . Breast cancer ([place])   . Supraventricular tachycardia ([place])   . Stroke ([place])     11/14/13  . Chronic diastolic (congestive) heart failure ([place])   . Bleeding nose     Thurs night (09/08/15) and Fri (09/09/15) left side.Bleeding didn't last long  . S/P aortic valve replacement with bioprosthetic valve 09/16/2015    23 mm [employer] Elite bioprosthetic tissue valve    Tobacco Use: History  Smoking status  . Never Smoker   Smokeless tobacco  . Not on file    Labs: Recent Review Flowsheet Data    Labs for ITP Cardiac and Pulmonary Rehab Latest Ref Rng 09/16/2015 09/16/2015 09/16/2015 09/17/2015 09/17/2015   PHART 7.350 - 7.450 7.271(L) 7.333(L) 7.305(L) 7.369 -   PCO2ART 35.0 - 45.0 mmHg 46.6(H) 43.5 43.2 44.4 -   HCO3 20.0 - 24.0 mEq/L 21.4 23.0 21.3 25.4(H) -   TCO2 0 - 100 mmol/L _0 ACIDBASEDEF 0.0 - 2.0 mmol/L 5.0(H) 3.0(H) 5.0(H) - -  O2SAT - 92.0 94.0 95.0 95.0 -       Exercise Target Goals:    Exercise Program Goal: Individual exercise prescription set with THRR, safety & activity barriers. Participant demonstrates ability to understand and report RPE using BORG scale, to self-measure pulse accurately, and to acknowledge the importance of the exercise prescription.  Exercise Prescription Goal: Starting with aerobic activity 30 plus minutes a day, 3 days per week for initial exercise prescription. Provide home exercise prescription and guidelines that participant acknowledges understanding prior to discharge.  Activity Barriers & Risk Stratification:     Activity Barriers & Cardiac Risk Stratification - 01/18/16 1524    Activity Barriers & Cardiac Risk Stratification   Activity Barriers Joint Problems;Arthritis;Left Knee  Replacement;Right Knee Replacement;Neck/Spine Problems;Back Problems;Deconditioning   Cardiac Risk Stratification High      6 Minute Walk:     6 Minute Walk      01/18/16 1608 03/07/16 1654     6 Minute Walk   Phase Initial Discharge    Distance 850 feet 1030 feet    Distance % Change  21 %    Walk Time 6 minutes 6 minutes    # of Rest Breaks  0    MPH 1.6     RPE 13 13    Resting HR 65 bpm 71 bpm    Resting BP 138/74 mmHg 132/70 mmHg    Max Ex. HR 113 bpm 91 bpm    Max Ex. BP 144/76 mmHg 162/74 mmHg       Initial Exercise Prescription:     Initial Exercise Prescription - 01/18/16 1600    Date of Initial Exercise RX and Referring Provider   Date 01/18/16   Treadmill   MPH 1.5   Grade 0   Minutes 15   Recumbant Bike   Level 2   RPM 40   Watts 20   Minutes 15   NuStep   Level 2   Watts 40   Minutes 15   Recumbant Elliptical   Level 2   RPM 40   Watts 20   Minutes 15   REL-XR   Level 2   Watts 50   Minutes 15   Biostep-RELP   Level 1   Watts 15   Minutes 15   Prescription Details   Frequency (times per week) 2-3   Duration Progress to 45 minutes of aerobic exercise without signs/symptoms of physical distress   Intensity   THRR REST +  30   Ratings of Perceived Exertion 11-15   Progression   Progression Continue to progress workloads to maintain intensity without signs/symptoms of physical distress.   Resistance Training   Training Prescription Yes   Weight 2   Reps 10-15      Perform Capillary Blood Glucose checks as needed.  Exercise Prescription Changes:     Exercise Prescription Changes      02/01/16 1600 02/02/16 1217 02/28/16 1600 03/15/16 1600     Exercise Review   Progression  Yes Yes Yes    Response to Exercise   Blood Pressure (Admit)  126/74 mmHg 142/74 mmHg     Blood Pressure (Exercise)  128/80 mmHg 134/64 mmHg     Blood Pressure (Exit)  122/82 mmHg 124/80 mmHg     Heart Rate (Admit)  65 bpm 64 bpm     Heart Rate  (Exercise)  80 bpm 84 bpm     Heart Rate (Exit)  67 bpm 58 bpm  Rating of Perceived Exertion (Exercise)  _0 Comments  Kayla Gray attends [facility] Track regularly and is progressing in duration and intensity.  Recently, she experienced a flare-up of her back pain and exercise modifications were made to accommodate her discomfort.      Duration  Progress to 45 minutes of aerobic exercise without signs/symptoms of physical distress Progress to 45 minutes of aerobic exercise without signs/symptoms of physical distress Progress to 45 minutes of aerobic exercise without signs/symptoms of physical distress    Intensity  Rest + 30 THRR New  40-80 % THRR 57-114 THRR New  40-80 % THRR 57-114    Resistance Training   Training Prescription Yes Yes Yes Yes    Weight 2 2 no no    Reps 10-15 10-15 10-15 10-15    Interval Training   Interval Training  No      Treadmill   MPH 1.5       Grade 0       Minutes 15       Recumbant Bike   Level 2       RPM 40       Watts 20       Minutes 15       NuStep   Level _1 Watts 40 34 45 45    Minutes _2 Recumbant Elliptical   Level 2       RPM 40       Watts 20       Minutes 15       REL-XR   Level 2       Watts 50       Minutes 15       Biostep-RELP   Level _3 Watts _4 Minutes _5 Home Exercise Plan   Plans to continue exercise at    [employer] and [place] spoke with Kayla Gray about [employer]        Exercise Comments:     Exercise Comments      02/02/16 1705 02/03/16 1222 02/28/16 1641 03/15/16 1629     Exercise Comments Pat cam e in today stating her back pain has increased and she ,may need to see her doctor about the cortisone shots. After reviewing her ex prescription with Jacqlyn Larsen, it was decided she will not work on the XR Ellipitcal today, but try the Biostep recumbent ellipitical.  At the end of the session she was not feeling any increased pain. Goal is for Pat to continue to  exercise with necessary modifications in order to improve cardiovascular tolerance and avoid exacerbation of back pain. Kayla Gray attends [facility] regularly and is progressing in tolerance to exercise.  She is limited mainly by occasional flare-ups of back pain and avoids equipment which exacerbates it. Spoke with pat about home exercise after graduation - is considering [employer].  Back pain and knees limit intensity so Kayla Gray is encouraged to continue increasing duration up to 45 min of cardio.       Discharge Exercise Prescription (Final Exercise Prescription Changes):     Exercise Prescription Changes - 03/15/16 1600    Exercise Review   Progression Yes   Response to Exercise   Rating of Perceived Exertion (Exercise) 12   Duration Progress to 45 minutes of aerobic exercise without signs/symptoms  of physical distress   Intensity THRR New  40-80 % THRR 57-114   Resistance Training   Training Prescription Yes   Weight no   Reps 10-15   NuStep   Level 3   Watts 45   Minutes 15   Biostep-RELP   Level 3   Watts 15   Minutes 15   Home Exercise Plan   Plans to continue exercise at [employer] and [place] spoke with Kayla Gray about [employer]       Nutrition:  Target Goals: Understanding of nutrition guidelines, daily intake of sodium <1541m, cholesterol <208m calories 30% from fat and 7% or less from saturated fats, daily to have 5 or more servings of fruits and vegetables.  Biometrics:     Pre Biometrics - 01/18/16 1613    Pre Biometrics   Height _0  (1.575 m)   Weight 163 lb 12.8 oz (74.299 kg)   Waist Circumference 36.75 inches   Hip Circumference 43.5 inches   Waist to Hip Ratio 0.84 %   BMI (Calculated) 30         Post Biometrics - 03/07/16 1653     Post  Biometrics   Height _1  (1.575 m)   Weight 167 lb 11.2 oz (76.068 kg)   Waist Circumference 36.75 inches   Hip Circumference 44 inches   Waist to Hip Ratio 0.84 %   BMI (Calculated) 30.7       Nutrition Therapy Plan and Nutrition Goals:     Nutrition Therapy & Goals - 01/26/16 1723    Nutrition Therapy   Drug/Food Interactions Coumadin/Vit K   Intervention Plan   Intervention Prescribe, educate and counsel regarding individualized specific dietary modifications aiming towards targeted core components such as weight, hypertension, lipid management, diabetes, heart failure and other comorbidities.   Expected Outcomes Short Term Goal: Understand basic principles of dietary content, such as calories, fat, sodium, cholesterol and nutrients.;Long Term Goal: Adherence to prescribed nutrition plan.      Nutrition Discharge: Rate Your Plate Scores:     Nutrition Assessments - 03/14/16 1657    Rate Your Plate Scores   Post Score 59   Post Score % 65.55 %      Nutrition Goals Re-Evaluation:   Psychosocial: Target Goals: Acknowledge presence or absence of depression, maximize coping skills, provide positive support system. Participant is able to verbalize types and ability to use techniques and skills needed for reducing stress and depression.  Initial Review & Psychosocial Screening:     Initial Psych Review & Screening - 01/18/16 1815    Family Dynamics   Comments Pat scored 14 on her PHQ-9.  Kayla Gray been on Paxil since the year 2000.  Kayla Gray never seen a counselor or therapist.  Patient has good support system in place.  Kayla Dins married; described her husband as being very supportive; has one son and two daughers; 5 grandchildren and 1 great grandchild.  Kayla Gray's daughter is her HC27    Screening Interventions   Interventions Encouraged to exercise;Program counselor consult      Quality of Life Scores:     Quality of Life - 03/14/16 1658    Quality of Life Scores   Health/Function Post 23.83 %   Socioeconomic Post 25.42 %   Psych/Spiritual Post 29.14 %   Family Post 30 %   GLOBAL Post 26.18 %       PHQ-9:  Recent Review Flowsheet Data    Depression screen [facility] 2/9 01/18/2016   Decreased Interest 2   Down, Depressed, Hopeless 1   PHQ - 2 Score 3   Altered sleeping 3   Tired, decreased energy 3   Change in appetite 3   Feeling bad or failure about yourself  2   Trouble concentrating 0   Moving slowly or fidgety/restless 0   Suicidal thoughts 0   PHQ-9 Score 14   Difficult doing work/chores Not difficult at all      Psychosocial Evaluation and Intervention:     Psychosocial Evaluation - 01/25/16 1647    Psychosocial Evaluation & Interventions   Interventions Encouraged to exercise with the program and follow exercise prescription;Relaxation education;Stress management education   Comments Counselor met with Kayla Gray today for initial psychosocial evaluation.   She is a 80 year old who had valve replacement 4 months ago.  She has a strong support system with a spouse of over 6 years and (3) adult children who live close by, as well as active involvement in her local church.  Kayla Gray has several other health issues including knee replacements and severe arthritis.  She also is a breast cancer survivor for 17 years.  Kayla Gray denies a history of depression or anxiety but reports she has been on Paxil since the breast cancer in 2000.  Although her PHQ-9 score was 14, she reports that her mood is typically positive, except for during the winter months when she notices more sadness.  Kayla Gray states that she has minimal stress in her life currently, other than a disabled daughter who just got out of the hopsital with Pneumonia.  Kayla Gray has goals to walk better with increased balance, to increase her energy, and to be educated on ways to eat more healthfully.  She plans to walk outside to maintain consistency in exercise once she completes this program.     Continued Psychosocial Services Needed Yes  Kayla Gray will benefit from consistent exercise; meeting with the dietician for  her healthy eating goal; and participate in the psychoeducational components of this program, especially on depression.      Psychosocial Re-Evaluation:     Psychosocial Re-Evaluation      02/22/16 1654 02/29/16 1657         Psychosocial Re-Evaluation   Comments Follow up with Kayla Gray reporting she is feeling stronger since she began this program and has noticed an improvement in her sleep at night as well.  Counselor will continue to follow with Kayla Gray as needed.   Follow up with Kayla Gray today reporting she continues to feel an increase in stamina and strength.  However, her balance continues to be a problem.  She hopes to address this further with an additional medical provider.  Kayla Gray reports her mood has improved as well since beginning this program as she has energy to do more now and that helps.  She states her daughter's health issues have improved which also contributes to less stress in her life.  Counselor will continue to follow with Kayla Gray in the future.           Vocational Rehabilitation: Provide vocational rehab assistance to qualifying candidates.   Vocational Rehab Evaluation & Intervention:     Vocational Rehab - 01/18/16 1527    Initial Vocational Rehab Evaluation & Intervention   Assessment shows need for Vocational Rehabilitation No      Education: Education Goals: Education classes  will be provided on a weekly basis, covering required topics. Participant will state understanding/return demonstration of topics presented.  Learning Barriers/Preferences:     Learning Barriers/Preferences - 01/18/16 1524    Learning Barriers/Preferences   Learning Barriers Exercise Concerns   Learning Preferences None      Education Topics: General Nutrition Guidelines/Fats and Fiber: -Group instruction provided by verbal, written material, models and posters to present the general guidelines for heart healthy nutrition. Gives an explanation and review of  dietary fats and fiber.   Controlling Sodium/Reading Food Labels: -Group verbal and written material supporting the discussion of sodium use in heart healthy nutrition. Review and explanation with models, verbal and written materials for utilization of the food label.          Cardiac Rehab from 03/14/2016 in [facility] Cardiac and Pulmonary Rehab   Date  01/23/16   Educator  PI   Instruction Review Code  2- meets goals/outcomes      Exercise Physiology & Risk Factors: - Group verbal and written instruction with models to review the exercise physiology of the cardiovascular system and associated critical values. Details cardiovascular disease risk factors and the goals associated with each risk factor.      Cardiac Rehab from 03/14/2016 in [facility] Cardiac and Pulmonary Rehab   Date  02/01/16   Educator  Oleta Mouse   Instruction Review Code  2- meets goals/outcomes      Aerobic Exercise & Resistance Training: - Gives group verbal and written discussion on the health impact of inactivity. On the components of aerobic and resistive training programs and the benefits of this training and how to safely progress through these programs.      Cardiac Rehab from 03/14/2016 in [facility] Cardiac and Pulmonary Rehab   Date  02/06/16   Educator  BS   Instruction Review Code  2- meets goals/outcomes      Flexibility, Balance, General Exercise Guidelines: - Provides group verbal and written instruction on the benefits of flexibility and balance training programs. Provides general exercise guidelines with specific guidelines to those with heart or lung disease. Demonstration and skill practice provided.      Cardiac Rehab from 03/14/2016 in [facility] Cardiac and Pulmonary Rehab   Date  02/08/16   Educator  S. Bice, RN   Instruction Review Code  2- meets goals/outcomes      Stress Management: - Provides group verbal and written instruction about the health risks of elevated stress, cause of high stress, and  healthy ways to reduce stress.      Cardiac Rehab from 03/14/2016 in [facility] Cardiac and Pulmonary Rehab   Date  02/15/16   Educator  Berle Mull, MSW   Instruction Review Code  2- meets goals/outcomes      Depression: - Provides group verbal and written instruction on the correlation between heart/lung disease and depressed mood, treatment options, and the stigmas associated with seeking treatment.      Cardiac Rehab from 03/14/2016 in [facility] Cardiac and Pulmonary Rehab   Date  03/14/16   Educator  Kathreen Cornfield, [facility]   Instruction Review Code  2- meets goals/outcomes      Anatomy & Physiology of the Heart: - Group verbal and written instruction and models provide basic cardiac anatomy and physiology, with the coronary electrical and arterial systems. Review of: AMI, Angina, Valve disease, Heart Failure, Cardiac Arrhythmia, Pacemakers, and the ICD.   Cardiac Procedures: - Group verbal and written instruction and models to describe the testing methods  done to diagnose heart disease. Reviews the outcomes of the test results. Describes the treatment choices: Medical Management, Angioplasty, or Coronary Bypass Surgery.      Cardiac Rehab from 03/14/2016 in [facility] Cardiac and Pulmonary Rehab   Date  02/20/16   Educator  SB   Instruction Review Code  2- meets goals/outcomes      Cardiac Medications: - Group verbal and written instruction to review commonly prescribed medications for heart disease. Reviews the medication, class of the drug, and side effects. Includes the steps to properly store meds and maintain the prescription regimen.      Cardiac Rehab from 03/14/2016 in [facility] Cardiac and Pulmonary Rehab   Date  02/29/16   Educator  DW   Instruction Review Code  2- meets goals/outcomes      Go Sex-Intimacy & Heart Disease, Get SMART - Goal Setting: - Group verbal and written instruction through game format to discuss heart disease and the return to sexual intimacy. Provides group verbal and  written material to discuss and apply goal setting through the application of the S.M.A.R.T. Method.      Cardiac Rehab from 03/14/2016 in [facility] Cardiac and Pulmonary Rehab   Date  02/20/16   Educator  SB   Instruction Review Code  2- meets goals/outcomes      Other Matters of the Heart: - Provides group verbal, written materials and models to describe Heart Failure, Angina, Valve Disease, and Diabetes in the realm of heart disease. Includes description of the disease process and treatment options available to the cardiac patient.      Cardiac Rehab from 03/14/2016 in [facility] Cardiac and Pulmonary Rehab   Date  01/30/16   Educator  SB   Instruction Review Code  2- meets goals/outcomes      Exercise & Equipment Safety: - Individual verbal instruction and demonstration of equipment use and safety with use of the equipment.      Cardiac Rehab from 01/18/2016 in [facility] Cardiac and Pulmonary Rehab   Date  01/18/16   Educator  D. Joya Gaskins, RN   Instruction Review Code  1- partially meets, needs review/practice      Infection Prevention: - Provides verbal and written material to individual with discussion of infection control including proper hand washing and proper equipment cleaning during exercise session.      Cardiac Rehab from 03/14/2016 in [facility] Cardiac and Pulmonary Rehab   Date  01/18/16   Educator  D. Joya Gaskins, RN   Instruction Review Code  2- meets goals/outcomes      Falls Prevention: - Provides verbal and written material to individual with discussion of falls prevention and safety.      Cardiac Rehab from 03/14/2016 in [facility] Cardiac and Pulmonary Rehab   Date  01/18/16   Educator  D. Joya Gaskins, RN   Instruction Review Code  2- meets goals/outcomes      Diabetes: - Individual verbal and written instruction to review signs/symptoms of diabetes, desired ranges of glucose level fasting, after meals and with exercise. Advice that pre and post exercise glucose checks will be done for 3  sessions at entry of program.    Knowledge Questionnaire Score:     Knowledge Questionnaire Score - 03/14/16 1658    Knowledge Questionnaire Score   Post Score 24      Core Components/Risk Factors/Patient Goals at Admission:     Personal Goals and Risk Factors at Admission - 01/18/16 1529    Core Components/Risk Factors/Patient Goals on Admission  Sedentary Yes   Intervention Provide advice, education, support and counseling about physical activity/exercise needs.;Develop an individualized exercise prescription for aerobic and resistive training based on initial evaluation findings, risk stratification, comorbidities and participant's personal goals.   Expected Outcomes Achievement of increased cardiorespiratory fitness and enhanced flexibility, muscular endurance and strength shown through measurements of functional capacity and personal statement of participant.   Increase Strength and Stamina Yes   Intervention Provide advice, education, support and counseling about physical activity/exercise needs.;Develop an individualized exercise prescription for aerobic and resistive training based on initial evaluation findings, risk stratification, comorbidities and participant's personal goals.   Expected Outcomes Achievement of increased cardiorespiratory fitness and enhanced flexibility, muscular endurance and strength shown through measurements of functional capacity and personal statement of participant.   Hypertension Yes   Intervention Provide education on lifestyle modifcations including regular physical activity/exercise, weight management, moderate sodium restriction and increased consumption of fresh fruit, vegetables, and low fat dairy, alcohol moderation, and smoking cessation.;Monitor prescription use compliance.   Expected Outcomes Short Term: Continued assessment and intervention until BP is < 140/49m HG in hypertensive participants. < 130/886mHG in hypertensive participants  with diabetes, heart failure or chronic kidney disease.;Long Term: Maintenance of blood pressure at goal levels.   Lipids Yes   Intervention Provide education and support for participant on nutrition & aerobic/resistive exercise along with prescribed medications to achieve LDL <7018mHDL >25m26m Expected Outcomes Short Term: Participant states understanding of desired cholesterol values and is compliant with medications prescribed. Participant is following exercise prescription and nutrition guidelines.;Long Term: Cholesterol controlled with medications as prescribed, with individualized exercise RX and with personalized nutrition plan. Value goals: LDL < 70mg2mL > 40 mg.      Core Components/Risk Factors/Patient Goals Review:      Goals and Risk Factor Review      02/09/16 1734           Core Components/Risk Factors/Patient Goals Review   Personal Goals Review Sedentary;Increase Strength and Stamina;Lipids;Hypertension       Review Pat sFraser Dined she cannot tell of any specific changes in her stamina or strength [place] [facility] and [place] stenosis limit her ability to increase her work load and aerobic activity.  Pat dFraser Dinstate she is sleeping better since exercising on a regular basis at [facility].  Cholesterol is controlled with current meds.  Pat hFraser Dinnot had cholesterol checked since starting Cardiac Rehab.  HTN:  BP upon check in has been ranging 126/70 - 140/80.  Upon check out BP has been 122/82 - 156/84.  Pat wanted to try the treadmill today.  She was on it for 2 minutes at a speed of 0.5 mph and had to get off as walking of  TM, as walking on TM was causing right knee pain according to patient.  Pat unable to take nonsteroidal pain meds due to being on blood thinner.  Pat tFraser Dins tylenol which doesn't help her DDD nor her right knee discomfort.  Pat hFraser Dincompleted 15 sessions for Cardiac Rehab thus far.            Core Components/Risk Factors/Patient  Goals at Discharge (Final Review):      Goals and Risk Factor Review - 02/09/16 1734    Core Components/Risk Factors/Patient Goals Review   Personal Goals Review Sedentary;Increase Strength and Stamina;Lipids;Hypertension   Review Pat sFraser Dined she cannot tell of any specific changes in her stamina or strength from participating in [facility],  as her DDD and spinal stenosis limit her ability to increase her work load and aerobic activity.  Kayla Gray did state she is sleeping better since exercising on a regular basis at [facility].  Cholesterol is controlled with current meds.  Kayla Gray has not had cholesterol checked since starting Card[facility] Rehab.  HTN:  BP upon check in has been ranging 126/70 - 140/80.  Upon check out BP has been 122/82 - 156/84.  Pat wanted to try the treadmill today.  She was on it for 2 minutes at a speed of 0.5 mph and had to get off as walking of  TM, as walking on TM was causing right knee pain according to patient.  Pat unable to take nonsteroidal pain meds due to being on blood thinner.  Kayla Gray takes tylenol which doesn't help her DDD nor her right knee discomfort.  Kayla Gray has completed 15 sessions for Cardiac Rehab thus far.        ITP Comments:     ITP Comments      01/26/16 1715 02/05/16 1040 03/04/16 1047       ITP Comments Pat reports that she is tired at times but overall she feels better. Kayla Gray said that she goes to her doctor every 6 months. She said she is doing well on her cholestrol medicine and her blood work gets checked yearly. Her blood pressure is very good.  30 Day review. Continue with ITP  six visits since orientation 30 day review. Continue with ITP        Comments:

## 2016-03-19 ENCOUNTER — Encounter: Payer: PPO | Admitting: *Deleted

## 2016-03-19 DIAGNOSIS — Z952 Presence of prosthetic heart valve: Secondary | ICD-10-CM | POA: Diagnosis not present

## 2016-03-19 NOTE — Progress Notes (Signed)
Discharge Summary  Patient Details  Name: Kayla Gray MRN: [id number] Date of Birth: 04/22/1936 Referring Provider:     Number of Visits: 36  Reason for Discharge:  Patient reached a stable level of exercise. Patient independent in their exercise.  Smoking History:  History  Smoking status  . Never Smoker   Smokeless tobacco  . Not on file    Diagnosis:  S/P AVR (aortic valve replacement)  ADL UCSD:   Initial Exercise Prescription:     Initial Exercise Prescription - 01/18/16 1600    Date of Initial Exercise RX and Referring Provider   Date 01/18/16   Treadmill   MPH 1.5   Grade 0   Minutes 15   Recumbant Bike   Level 2   RPM 40   Watts 20   Minutes 15   NuStep   Level 2   Watts 40   Minutes 15   Recumbant Elliptical   Level 2   RPM 40   Watts 20   Minutes 15   REL-XR   Level 2   Watts 50   Minutes 15   Biostep-RELP   Level 1   Watts 15   Minutes 15   Prescription Details   Frequency (times per week) 2-3   Duration Progress to 45 minutes of aerobic exercise without signs/symptoms of physical distress   Intensity   THRR REST +  30   Ratings of Perceived Exertion 11-15   Progression   Progression Continue to progress workloads to maintain intensity without signs/symptoms of physical distress.   Resistance Training   Training Prescription Yes   Weight 2   Reps 10-15      Discharge Exercise Prescription (Final Exercise Prescription Changes):     Exercise Prescription Changes - 03/15/16 1600    Exercise Review   Progression Yes   Response to Exercise   Rating of Perceived Exertion (Exercise) 12   Duration Progress to 45 minutes of aerobic exercise without signs/symptoms of physical distress   Intensity THRR New  40-80 % THRR 57-114   Resistance Training   Training Prescription Yes   Weight no   Reps 10-15   NuStep   Level 3   Watts 45   Minutes 15   Biostep-RELP   Level 3   Watts 15   Minutes 15   Home Exercise Plan   Plans to continue exercise at [employer] and [name] spoke with [name] about [employer]       Functional Capacity:     6 Minute Walk      01/18/16 1608 03/07/16 1654     6 Minute Walk   Phase Initial Discharge    Distance 850 feet 1030 feet    Distance % Change  21 %    Walk Time 6 minutes 6 minutes    # of Rest Breaks  0    MPH 1.6     RPE 13 13    Resting HR 65 bpm 71 bpm    Resting BP 138/74 mmHg 132/70 mmHg    Max Ex. HR 113 bpm 91 bpm    Max Ex. BP 144/76 mmHg 162/74 mmHg       Psychological, QOL, Others - Outcomes: PHQ 2/9: Depression screen PHQ 2/9 01/18/2016  Decreased Interest 2  Down, Depressed, Hopeless 1  PHQ - 2 Score 3  Altered sleeping 3  Tired, decreased energy 3  Change in appetite 3  Feeling bad or failure about yourself  2  Trouble concentrating 0  Moving slowly or fidgety/restless 0  Suicidal thoughts 0  PHQ-9 Score 14  Difficult doing work/chores Not difficult at all    Quality of Life:     Quality of Life - 03/14/16 1658    Quality of Life Scores   Health/Function Post 23.83 %   Socioeconomic Post 25.42 %   Psych/Spiritual Post 29.14 %   Family Post 30 %   GLOBAL Post 26.18 %      Personal Goals: Goals established at orientation with interventions provided to work toward goal.     Personal Goals and Risk Factors at Admission - 01/18/16 1529    Core Components/Risk Factors/Patient Goals on Admission   Sedentary Yes   Intervention Provide advice, education, support and counseling about physical activity/exercise needs.;Develop an individualized exercise prescription for aerobic and resistive training based on initial evaluation findings, risk stratification, comorbidities and participant's personal goals.   Expected Outcomes Achievement of increased cardiorespiratory fitness and enhanced flexibility, muscular endurance and strength shown through measurements of functional capacity and personal statement of participant.    Increase Strength and Stamina Yes   Intervention Provide advice, education, support and counseling about physical activity/exercise needs.;Develop an individualized exercise prescription for aerobic and resistive training based on initial evaluation findings, risk stratification, comorbidities and participant's personal goals.   Expected Outcomes Achievement of increased cardiorespiratory fitness and enhanced flexibility, muscular endurance and strength shown through measurements of functional capacity and personal statement of participant.   Hypertension Yes   Intervention Provide education on lifestyle modifcations including regular physical activity/exercise, weight management, moderate sodium restriction and increased consumption of fresh fruit, vegetables, and low fat dairy, alcohol moderation, and smoking cessation.;Monitor prescription use compliance.   Expected Outcomes Short Term: Continued assessment and intervention until BP is < 140/24mm HG in hypertensive participants. < 130/31mm HG in hypertensive participants with diabetes, heart failure or chronic kidney disease.;Long Term: Maintenance of blood pressure at goal levels.   Lipids Yes   Intervention Provide education and support for participant on nutrition & aerobic/resistive exercise along with prescribed medications to achieve LDL 70mg , HDL >40mg .   Expected Outcomes Short Term: Participant states understanding of desired cholesterol values and is compliant with medications prescribed. Participant is following exercise prescription and nutrition guidelines.;Long Term: Cholesterol controlled with medications as prescribed, with individualized exercise RX and with personalized nutrition plan. Value goals: LDL < 70mg , HDL > 40 mg.       Personal Goals Discharge:     Goals and Risk Factor Review      02/09/16 1734           Core Components/Risk Factors/Patient Goals Review   Personal Goals Review Sedentary;Increase Strength and  Stamina;Lipids;Hypertension       Review [name] stated she cannot tell of any specific changes in her stamina or strength from participating in [place], as her DDD and spinal stenosis limit her ability to increase her work load and aerobic activity.  [name] did state she is sleeping better since exercising on a regular basis at [facility].  Cholesterol is controlled with current meds.  [name] has not had cholesterol checked since starting Cardiac Rehab.  HTN:  BP upon check in has been ranging 126/70 - 140/80.  Upon check out BP has been 122/82 - 156/84.  Pat wanted to try the treadmill today.  She was on it for 2 minutes at a speed of 0.5 mph and had to get off as walking of  TM, as  walking on TM was causing right knee pain according to patient.  Pat unable to take nonsteroidal pain meds due to being on blood thinner.  [name] takes tylenol which doesn't help her DDD nor her right knee discomfort.  [name] has completed 15 sessions for Cardiac Rehab thus far.            Nutrition & Weight - Outcomes:     Pre Biometrics - 01/18/16 1613    Pre Biometrics   Height 5\' 2"  (1.575 m)   Weight 163 lb 12.8 oz (74.299 kg)   Waist Circumference 36.75 inches   Hip Circumference 43.5 inches   Waist to Hip Ratio 0.84 %   BMI (Calculated) 30         Post Biometrics - 03/07/16 1653     Post  Biometrics   Height 5\' 2"  (1.575 m)   Weight 167 lb 11.2 oz (76.068 kg)   Waist Circumference 36.75 inches   Hip Circumference 44 inches   Waist to Hip Ratio 0.84 %   BMI (Calculated) 30.7      Nutrition:     Nutrition Therapy & Goals - 03/15/16 1750    Nutrition Therapy   Diet basic heart healthy diet   Drug/Food Interactions Coumadin/Vit K   Personal Nutrition Goals   Personal Goal #1 Decrease sugar in tea gradually. You can use Splenda (sucralose) or Stevia Monia Pouch) to replace sugar.   Personal Goal #2 Try to keep to one sweet treat per day, and keep the portion small. Mostly use fruit as dessert.     Personal Goal #3 Include beans or peas as one vegetable with your lunch (for protein). Consider adding 1/8 - 1/4 cup nuts, or 1-2 boiled eggs to cereal breakfast.    Comments Reviewed general heart healthy guidelines and provided written resource.    Intervention Plan   Intervention Prescribe, educate and counsel regarding individualized specific dietary modifications aiming towards targeted core components such as weight, hypertension, lipid management, diabetes, heart failure and other comorbidities.;Nutrition handout(s) given to patient.   Expected Outcomes Short Term Goal: Understand basic principles of dietary content, such as calories, fat, sodium, cholesterol and nutrients.;Short Term Goal: A plan has been developed with personal nutrition goals set during dietitian appointment.;Long Term Goal: Adherence to prescribed nutrition plan.      Nutrition Discharge:     Nutrition Assessments - 03/14/16 1657    Rate Your Plate Scores   Post Score 59   Post Score % 65.55 %      Education Questionnaire Score:     Knowledge Questionnaire Score - 03/14/16 1658    Knowledge Questionnaire Score   Post Score 24      Goals reviewed with patient; copy given to patient.

## 2016-03-19 NOTE — Progress Notes (Signed)
Daily Session Note  Patient Details  Name: [name] MRN: [id number] Date of Birth: 1935/12/13 Referring Provider:    Encounter Date: 03/19/2016  Check In:     Session Check In - 03/19/16 1614    Check-In   Location [facility]   Staff Present Earlean Shawl, BS, ACSM CEP, Exercise Physiologist;Amanda Oletta Darter, BA, ACSM CEP, Exercise Physiologist;Diane Joya Gaskins, RN, BSN   Supervising physician immediately available to respond to emergencies See telemetry face sheet for immediately available ER MD   Medication changes reported     No   Fall or balance concerns reported    No   Warm-up and Cool-down Performed on first and last piece of equipment   Resistance Training Performed Yes   VAD Patient? No   Pain Assessment   Currently in Pain? No/denies   Multiple Pain Sites No         Goals Met:  Independence with exercise equipment Exercise tolerated well No report of cardiac concerns or symptoms Strength training completed today  Goals Unmet:  Not Applicable  Comments: Patient completed exercise prescription and all exercise goals during rehab session. The exercise was tolerated well and the patient is progressing in the program.     Dr. Emily Filbert is Medical Director for [facility] and [facility].

## 2016-03-19 NOTE — Progress Notes (Addendum)
Cardiac Individual Treatment Plan  Patient Details  Name: Kayla Gray MRN: [id number] Date of Birth: Mar 06, 1936 Referring Provider:    Initial Encounter Date:       Cardiac Rehab from 01/18/2016 in [facility] Cardiac and Pulmonary Rehab   Date  01/18/16      Visit Diagnosis: S/P AVR (aortic valve replacement)  Patient's Home Medications on Admission:  Current outpatient prescriptions:  .  acetaminophen (TYLENOL) 325 MG tablet, Take 650 mg by mouth every 6 (six) hours as needed., Disp: , Rfl:  .  amiodarone (PACERONE) 200 MG tablet, Take 200 mg by mouth daily., Disp: , Rfl: 3 .  Calcium Carbonate-Vitamin D (CALCIUM 600 + D PO), Take by mouth 2 (two) times daily., Disp: , Rfl:  .  ezetimibe (ZETIA) 10 MG tablet, Take 10 mg by mouth daily., Disp: , Rfl:  .  fluticasone (FLONASE) 50 MCG/ACT nasal spray, Place 2 sprays into the nose daily as needed., Disp: , Rfl:  .  metoprolol tartrate (LOPRESSOR) 25 MG tablet, TAKE 1 TABLET TWICE DAILY, Disp: 60 tablet, Rfl: 3 .  Multiple Vitamin (MULTIVITAMIN) tablet, Take 1 tablet by mouth daily., Disp: , Rfl:  .  NON FORMULARY, at bedtime. CPAP, Disp: , Rfl:  .  pantoprazole (PROTONIX) 40 MG tablet, Take 40 mg by mouth daily. , Disp: , Rfl:  .  PARoxetine (PAXIL) 20 MG tablet, Take 20 mg by mouth daily., Disp: , Rfl:  .  warfarin (COUMADIN) 2.5 MG tablet, Take 1 tablet (2.5 mg total) by mouth daily at 6 PM. Or as directed by Dr. Rockey Gray (Patient taking differently: Take 2.5 mg by mouth daily. Or as directed by Dr. Rockey Gray), Disp: 30 tablet, Rfl: 1 .  warfarin (COUMADIN) 2.5 MG tablet, Take 2.5 mg by mouth daily. Reported on 01/25/2016, Disp: , Rfl:  .  warfarin (COUMADIN) 2.5 MG tablet, TAKE 1 TABLET (2.5 MG TOTAL) BY MOUTH DAILY AT 6 PM. OR AS DIRECTED BY DR. Rockey Gray, Disp: 55 tablet, Rfl: 3  Past Medical History: Past Medical History  Diagnosis Date  . Obstructive sleep apnea   . Hyperlipidemia   . Depression   . Osteopenia   . Iron deficiency anemia    . Syncope and collapse   . Aortic stenosis   . Bilateral cataracts   . Diverticulosis   . Osteoarthritis   . Gastroesophageal reflux disease   . Peptic ulcer disease   . Urinary tract infection   . Heart murmur   . Breast cancer ([place])   . Supraventricular tachycardia ([place])   . Stroke ([place])     11/14/13  . Chronic diastolic (congestive) heart failure ([place])   . Bleeding nose     Thurs night (09/08/15) and Fri (09/09/15) left side.Bleeding didn't last long  . S/P aortic valve replacement with bioprosthetic valve 09/16/2015    23 mm [employer] Elite bioprosthetic tissue valve    Tobacco Use: History  Smoking status  . Never Smoker   Smokeless tobacco  . Not on file    Labs: Recent Review Flowsheet Data    Labs for ITP Cardiac and Pulmonary Rehab Latest Ref Rng 09/16/2015 09/16/2015 09/16/2015 09/17/2015 09/17/2015   PHART 7.350 - 7.450 7.271(L) 7.333(L) 7.305(L) 7.369 -   PCO2ART 35.0 - 45.0 mmHg 46.6(H) 43.5 43.2 44.4 -   HCO3 20.0 - 24.0 mEq/L 21.4 23.0 21.3 25.4(H) -   TCO2 0 - 100 mmol/L _0 ACIDBASEDEF 0.0 - 2.0 mmol/L 5.0(H) 3.0(H) 5.0(H) - -  O2SAT - 92.0 94.0 95.0 95.0 -       Exercise Target Goals:    Exercise Program Goal: Individual exercise prescription set with THRR, safety & activity barriers. Participant demonstrates ability to understand and report RPE using BORG scale, to self-measure pulse accurately, and to acknowledge the importance of the exercise prescription.  Exercise Prescription Goal: Starting with aerobic activity 30 plus minutes a day, 3 days per week for initial exercise prescription. Provide home exercise prescription and guidelines that participant acknowledges understanding prior to discharge.  Activity Barriers & Risk Stratification:     Activity Barriers & Cardiac Risk Stratification - 01/18/16 1524    Activity Barriers & Cardiac Risk Stratification   Activity Barriers Joint Problems;Arthritis;Left Knee  Replacement;Right Knee Replacement;Neck/Spine Problems;Back Problems;Deconditioning   Cardiac Risk Stratification High      6 Minute Walk:     6 Minute Walk      01/18/16 1608 03/07/16 1654     6 Minute Walk   Phase Initial Discharge    Distance 850 feet 1030 feet    Distance % Change  21 %    Walk Time 6 minutes 6 minutes    # of Rest Breaks  0    MPH 1.6     RPE 13 13    Resting HR 65 bpm 71 bpm    Resting BP 138/74 mmHg 132/70 mmHg    Max Ex. HR 113 bpm 91 bpm    Max Ex. BP 144/76 mmHg 162/74 mmHg       Initial Exercise Prescription:     Initial Exercise Prescription - 01/18/16 1600    Date of Initial Exercise RX and Referring Provider   Date 01/18/16   Treadmill   MPH 1.5   Grade 0   Minutes 15   Recumbant Bike   Level 2   RPM 40   Watts 20   Minutes 15   NuStep   Level 2   Watts 40   Minutes 15   Recumbant Elliptical   Level 2   RPM 40   Watts 20   Minutes 15   REL-XR   Level 2   Watts 50   Minutes 15   Biostep-RELP   Level 1   Watts 15   Minutes 15   Prescription Details   Frequency (times per week) 2-3   Duration Progress to 45 minutes of aerobic exercise without signs/symptoms of physical distress   Intensity   THRR REST +  30   Ratings of Perceived Exertion 11-15   Progression   Progression Continue to progress workloads to maintain intensity without signs/symptoms of physical distress.   Resistance Training   Training Prescription Yes   Weight 2   Reps 10-15      Perform Capillary Blood Glucose checks as needed.  Exercise Prescription Changes:     Exercise Prescription Changes      02/01/16 1600 02/02/16 1217 02/28/16 1600 03/15/16 1600     Exercise Review   Progression  Yes Yes Yes    Response to Exercise   Blood Pressure (Admit)  126/74 mmHg 142/74 mmHg     Blood Pressure (Exercise)  128/80 mmHg 134/64 mmHg     Blood Pressure (Exit)  122/82 mmHg 124/80 mmHg     Heart Rate (Admit)  65 bpm 64 bpm     Heart Rate  (Exercise)  80 bpm 84 bpm     Heart Rate (Exit)  67 bpm 58 bpm  Rating of Perceived Exertion (Exercise)  _0 Comments  Kayla Gray attends [facility] regularly and is progressing in duration and intensity.  Recently, she experienced a flare-up of her back pain and exercise modifications were made to accommodate her discomfort.      Duration  Progress to 45 minutes of aerobic exercise without signs/symptoms of physical distress Progress to 45 minutes of aerobic exercise without signs/symptoms of physical distress Progress to 45 minutes of aerobic exercise without signs/symptoms of physical distress    Intensity  Rest + 30 THRR New  40-80 % THRR 57-114 THRR New  40-80 % THRR 57-114    Resistance Training   Training Prescription Yes Yes Yes Yes    Weight 2 2 no no    Reps 10-15 10-15 10-15 10-15    Interval Training   Interval Training  No      Treadmill   MPH 1.5       Grade 0       Minutes 15       Recumbant Bike   Level 2       RPM 40       Watts 20       Minutes 15       NuStep   Level _1 Watts 40 34 45 45    Minutes _2 Recumbant Elliptical   Level 2       RPM 40       Watts 20       Minutes 15       REL-XR   Level 2       Watts 50       Minutes 15       Biostep-RELP   Level _3 Watts _4 Minutes _5 Home Exercise Plan   Plans to continue exercise at    [employer] and [place] spoke with Kayla Gray about [employer]        Exercise Comments:     Exercise Comments      02/02/16 1705 02/03/16 1222 02/28/16 1641 03/15/16 1629     Exercise Comments Pat cam e in today stating her back pain has increased and she ,may need to see her doctor about the cortisone shots. After reviewing her ex prescription with Jacqlyn Larsen, it was decided she will not work on the XR Ellipitcal today, but try the Biostep recumbent ellipitical.  At the end of the session she was not feeling any increased pain. Goal is for Pat to continue to  exercise with necessary modifications in order to improve cardiovascular tolerance and avoid exacerbation of back pain. Kayla Gray attends [facility] regularly and is progressing in tolerance to exercise.  She is limited mainly by occasional flare-ups of back pain and avoids equipment which exacerbates it. Spoke with pat about home exercise after graduation - is considering [employer].  Back pain and knees limit intensity so Kayla Gray is encouraged to continue increasing duration up to 45 min of cardio.       Discharge Exercise Prescription (Final Exercise Prescription Changes):     Exercise Prescription Changes - 03/15/16 1600    Exercise Review   Progression Yes   Response to Exercise   Rating of Perceived Exertion (Exercise) 12   Duration Progress to 45 minutes of aerobic exercise without signs/symptoms  of physical distress   Intensity THRR New  40-80 % THRR 57-114   Resistance Training   Training Prescription Yes   Weight no   Reps 10-15   NuStep   Level 3   Watts 45   Minutes 15   Biostep-RELP   Level 3   Watts 15   Minutes 15   Home Exercise Plan   Plans to continue exercise at [employer] and [place] spoke with Kayla Gray about [employer]       Nutrition:  Target Goals: Understanding of nutrition guidelines, daily intake of sodium '1500mg'$ , cholesterol '200mg'$ , calories 30% from fat and 7% or less from saturated fats, daily to have 5 or more servings of fruits and vegetables.  Biometrics:     Pre Biometrics - 01/18/16 1613    Pre Biometrics   Height '5\' 2"'$  (1.575 m)   Weight 163 lb 12.8 oz (74.299 kg)   Waist Circumference 36.75 inches   Hip Circumference 43.5 inches   Waist to Hip Ratio 0.84 %   BMI (Calculated) 30         Post Biometrics - 03/07/16 1653     Post  Biometrics   Height '5\' 2"'$  (1.575 m)   Weight 167 lb 11.2 oz (76.068 kg)   Waist Circumference 36.75 inches   Hip Circumference 44 inches   Waist to Hip Ratio 0.84 %   BMI (Calculated) 30.7       Nutrition Therapy Plan and Nutrition Goals:     Nutrition Therapy & Goals - 03/15/16 1750    Nutrition Therapy   Diet basic heart healthy diet   Drug/Food Interactions Coumadin/Vit K   Personal Nutrition Goals   Personal Goal #1 Decrease sugar in tea gradually. You can use Splenda (sucralose) or Stevia Monia Pouch) to replace sugar.   Personal Goal #2 Try to keep to one sweet treat per day, and keep the portion small. Mostly use fruit as dessert.    Personal Goal #3 Include beans or peas as one vegetable with your lunch (for protein). Consider adding 1/8 - 1/4 cup nuts, or 1-2 boiled eggs to cereal breakfast.    Comments Reviewed general heart healthy guidelines and provided written resource.    Intervention Plan   Intervention Prescribe, educate and counsel regarding individualized specific dietary modifications aiming towards targeted core components such as weight, hypertension, lipid management, diabetes, heart failure and other comorbidities.;Nutrition handout(s) given to patient.   Expected Outcomes Short Term Goal: Understand basic principles of dietary content, such as calories, fat, sodium, cholesterol and nutrients.;Short Term Goal: A plan has been developed with personal nutrition goals set during dietitian appointment.;Long Term Goal: Adherence to prescribed nutrition plan.      Nutrition Discharge: Rate Your Plate Scores:     Nutrition Assessments - 03/14/16 1657    Rate Your Plate Scores   Post Score 59   Post Score % 65.55 %      Nutrition Goals Re-Evaluation:   Psychosocial: Target Goals: Acknowledge presence or absence of depression, maximize coping skills, provide positive support system. Participant is able to verbalize types and ability to use techniques and skills needed for reducing stress and depression.  Initial Review & Psychosocial Screening:     Initial Psych Review & Screening - 01/18/16 1815    Family Dynamics   Comments Pat scored 14 on her  PHQ-9.  Kayla Gray has been on Paxil since the year 2000.  Kayla Gray has never seen a Social worker or therapist.  Patient  has good support system in place.  Dennie Bible is married; described her husband as being very supportive; has one son and two daughers; 5 grandchildren and 1 great grandchild.  Dennie Bible attends [employer].  Ashok Cordia, patient's daughter is her HCPOA.     Screening Interventions   Interventions Encouraged to exercise;Program counselor consult      Quality of Life Scores:     Quality of Life - 03/14/16 1658    Quality of Life Scores   Health/Function Post 23.83 %   Socioeconomic Post 25.42 %   Psych/Spiritual Post 29.14 %   Family Post 30 %   GLOBAL Post 26.18 %      PHQ-9:     Recent Review Flowsheet Data    Depression screen [facility] 2/9 01/18/2016   Decreased Interest 2   Down, Depressed, Hopeless 1   PHQ - 2 Score 3   Altered sleeping 3   Tired, decreased energy 3   Change in appetite 3   Feeling bad or failure about yourself  2   Trouble concentrating 0   Moving slowly or fidgety/restless 0   Suicidal thoughts 0   PHQ-9 Score 14   Difficult doing work/chores Not difficult at all      Psychosocial Evaluation and Intervention:     Psychosocial Evaluation - 01/25/16 1647    Psychosocial Evaluation & Interventions   Interventions Encouraged to exercise with the program and follow exercise prescription;Relaxation education;Stress management education   Comments Counselor met with Ms. Evans today for initial psychosocial evaluation.   She is a 80 year old who had valve replacement 4 months ago.  She has a strong support system with a spouse of over 6 years and (3) adult children who live close by, as well as active involvement in her local church.  Ms. Tessmer has several other health issues including knee replacements and severe arthritis.  She also is a breast cancer survivor for 17 years.  Ms. Lore denies a history of depression or anxiety but reports she has been on  Paxil since the breast cancer in 2000.  Although her PHQ-9 score was 14, she reports that her mood is typically positive, except for during the winter months when she notices more sadness.  Ms. Luckman states that she has minimal stress in her life currently, other than a disabled daughter who just got out of the hopsital with Pneumonia.  Ms. Squyres has goals to walk better with increased balance, to increase her energy, and to be educated on ways to eat more healthfully.  She plans to walk outside to maintain consistency in exercise once she completes this program.     Continued Psychosocial Services Needed Yes  Ms. Jessop will benefit from consistent exercise; meeting with the dietician for her healthy eating goal; and participate in the psychoeducational components of this program, especially on depression.      Psychosocial Re-Evaluation:     Psychosocial Re-Evaluation      02/22/16 1654 02/29/16 1657         Psychosocial Re-Evaluation   Comments Follow up with Ms. Kloss reporting she is feeling stronger since she began this program and has noticed an improvement in her sleep at night as well.  Counselor will continue to follow with Ms. Strebeck as needed.   Follow up with Ms. Mckeone today reporting she continues to feel an increase in stamina and strength.  However, her balance continues to be a problem.  She hopes to address this further  with an additional medical provider.  Ms. Neace reports her mood has improved as well since beginning this program as she has energy to do more now and that helps.  She states her daughter's health issues have improved which also contributes to less stress in her life.  Counselor will continue to follow with Ms. Majerus in the future.           Vocational Rehabilitation: Provide vocational rehab assistance to qualifying candidates.   Vocational Rehab Evaluation & Intervention:     Vocational Rehab - 01/18/16 1527    Initial Vocational Rehab Evaluation &  Intervention   Assessment shows need for Vocational Rehabilitation No      Education: Education Goals: Education classes will be provided on a weekly basis, covering required topics. Participant will state understanding/return demonstration of topics presented.  Learning Barriers/Preferences:     Learning Barriers/Preferences - 01/18/16 1524    Learning Barriers/Preferences   Learning Barriers Exercise Concerns   Learning Preferences None      Education Topics: General Nutrition Guidelines/Fats and Fiber: -Group instruction provided by verbal, written material, models and posters to present the general guidelines for heart healthy nutrition. Gives an explanation and review of dietary fats and fiber.          Cardiac Rehab from 03/19/2016 in [facility] Cardiac and Pulmonary Rehab   Date  03/19/16   Educator  PI   Instruction Review Code  2- meets goals/outcomes      Controlling Sodium/Reading Food Labels: -Group verbal and written material supporting the discussion of sodium use in heart healthy nutrition. Review and explanation with models, verbal and written materials for utilization of the food label.      Cardiac Rehab from 03/19/2016 in [facility] Cardiac and Pulmonary Rehab   Date  01/23/16   Educator  PI   Instruction Review Code  2- meets goals/outcomes      Exercise Physiology & Risk Factors: - Group verbal and written instruction with models to review the exercise physiology of the cardiovascular system and associated critical values. Details cardiovascular disease risk factors and the goals associated with each risk factor.      Cardiac Rehab from 03/19/2016 in [facility] Cardiac and Pulmonary Rehab   Date  02/01/16   Educator  Oleta Mouse   Instruction Review Code  2- meets goals/outcomes      Aerobic Exercise & Resistance Training: - Gives group verbal and written discussion on the health impact of inactivity. On the components of aerobic and resistive training programs and  the benefits of this training and how to safely progress through these programs.      Cardiac Rehab from 03/19/2016 in [facility] Cardiac and Pulmonary Rehab   Date  02/06/16   Educator  BS   Instruction Review Code  2- meets goals/outcomes      Flexibility, Balance, General Exercise Guidelines: - Provides group verbal and written instruction on the benefits of flexibility and balance training programs. Provides general exercise guidelines with specific guidelines to those with heart or lung disease. Demonstration and skill practice provided.      Cardiac Rehab from 03/19/2016 in [facility] Cardiac and Pulmonary Rehab   Date  02/08/16   Educator  S. Bice, RN   Instruction Review Code  2- meets goals/outcomes      Stress Management: - Provides group verbal and written instruction about the health risks of elevated stress, cause of high stress, and healthy ways to reduce stress.      Cardiac  Rehab from 03/19/2016 in [facility] Cardiac and Pulmonary Rehab   Date  02/15/16   Educator  Berle Mull, MSW   Instruction Review Code  2- meets goals/outcomes      Depression: - Provides group verbal and written instruction on the correlation between heart/lung disease and depressed mood, treatment options, and the stigmas associated with seeking treatment.      Cardiac Rehab from 03/19/2016 in [facility] Cardiac and Pulmonary Rehab   Date  03/14/16   Educator  Kathreen Cornfield, [facility]   Instruction Review Code  2- meets goals/outcomes      Anatomy & Physiology of the Heart: - Group verbal and written instruction and models provide basic cardiac anatomy and physiology, with the coronary electrical and arterial systems. Review of: AMI, Angina, Valve disease, Heart Failure, Cardiac Arrhythmia, Pacemakers, and the ICD.   Cardiac Procedures: - Group verbal and written instruction and models to describe the testing methods done to diagnose heart disease. Reviews the outcomes of the test results. Describes the treatment choices:  Medical Management, Angioplasty, or Coronary Bypass Surgery.      Cardiac Rehab from 03/19/2016 in [facility] Cardiac and Pulmonary Rehab   Date  02/20/16   Educator  SB   Instruction Review Code  2- meets goals/outcomes      Cardiac Medications: - Group verbal and written instruction to review commonly prescribed medications for heart disease. Reviews the medication, class of the drug, and side effects. Includes the steps to properly store meds and maintain the prescription regimen.      Cardiac Rehab from 03/19/2016 in [facility] Cardiac and Pulmonary Rehab   Date  02/29/16   Educator  DW   Instruction Review Code  2- meets goals/outcomes      Go Sex-Intimacy & Heart Disease, Get SMART - Goal Setting: - Group verbal and written instruction through game format to discuss heart disease and the return to sexual intimacy. Provides group verbal and written material to discuss and apply goal setting through the application of the S.M.A.R.T. Method.      Cardiac Rehab from 03/19/2016 in [facility] Cardiac and Pulmonary Rehab   Date  02/20/16   Educator  SB   Instruction Review Code  2- meets goals/outcomes      Other Matters of the Heart: - Provides group verbal, written materials and models to describe Heart Failure, Angina, Valve Disease, and Diabetes in the realm of heart disease. Includes description of the disease process and treatment options available to the cardiac patient.      Cardiac Rehab from 03/19/2016 in [facility] Cardiac and Pulmonary Rehab   Date  01/30/16   Educator  SB   Instruction Review Code  2- meets goals/outcomes      Exercise & Equipment Safety: - Individual verbal instruction and demonstration of equipment use and safety with use of the equipment.      Cardiac Rehab from 01/18/2016 in [facility] Cardiac and Pulmonary Rehab   Date  01/18/16   Educator  D. Joya Gaskins, RN   Instruction Review Code  1- partially meets, needs review/practice      Infection Prevention: - Provides verbal and written  material to individual with discussion of infection control including proper hand washing and proper equipment cleaning during exercise session.      Cardiac Rehab from 03/19/2016 in [facility] Cardiac and Pulmonary Rehab   Date  01/18/16   Educator  D. Joya Gaskins, RN   Instruction Review Code  2- meets goals/outcomes      Falls  Prevention: - Provides verbal and written material to individual with discussion of falls prevention and safety.      Cardiac Rehab from 03/19/2016 in [facility] Cardiac and Pulmonary Rehab   Date  01/18/16   Educator  D. Joya Gaskins, RN   Instruction Review Code  2- meets goals/outcomes      Diabetes: - Individual verbal and written instruction to review signs/symptoms of diabetes, desired ranges of glucose level fasting, after meals and with exercise. Advice that pre and post exercise glucose checks will be done for 3 sessions at entry of program.    Knowledge Questionnaire Score:     Knowledge Questionnaire Score - 03/14/16 1658    Knowledge Questionnaire Score   Post Score 24      Core Components/Risk Factors/Patient Goals at Admission:     Personal Goals and Risk Factors at Admission - 01/18/16 1529    Core Components/Risk Factors/Patient Goals on Admission   Sedentary Yes   Intervention Provide advice, education, support and counseling about physical activity/exercise needs.;Develop an individualized exercise prescription for aerobic and resistive training based on initial evaluation findings, risk stratification, comorbidities and participant's personal goals.   Expected Outcomes Achievement of increased cardiorespiratory fitness and enhanced flexibility, muscular endurance and strength shown through measurements of functional capacity and personal statement of participant.   Increase Strength and Stamina Yes   Intervention Provide advice, education, support and counseling about physical activity/exercise needs.;Develop an individualized exercise prescription for  aerobic and resistive training based on initial evaluation findings, risk stratification, comorbidities and participant's personal goals.   Expected Outcomes Achievement of increased cardiorespiratory fitness and enhanced flexibility, muscular endurance and strength shown through measurements of functional capacity and personal statement of participant.   Hypertension Yes   Intervention Provide education on lifestyle modifcations including regular physical activity/exercise, weight management, moderate sodium restriction and increased consumption of fresh fruit, vegetables, and low fat dairy, alcohol moderation, and smoking cessation.;Monitor prescription use compliance.   Expected Outcomes Short Term: Continued assessment and intervention until BP is < 140/20m HG in hypertensive participants. < 130/868mHG in hypertensive participants with diabetes, heart failure or chronic kidney disease.;Long Term: Maintenance of blood pressure at goal levels.   Lipids Yes   Intervention Provide education and support for participant on nutrition & aerobic/resistive exercise along with prescribed medications to achieve LDL <7051mHDL >25m5m Expected Outcomes Short Term: Participant states understanding of desired cholesterol values and is compliant with medications prescribed. Participant is following exercise prescription and nutrition guidelines.;Long Term: Cholesterol controlled with medications as prescribed, with individualized exercise RX and with personalized nutrition plan. Value goals: LDL < 70mg22mL > 40 mg.      Core Components/Risk Factors/Patient Goals Review:      Goals and Risk Factor Review      02/09/16 1734           Core Components/Risk Factors/Patient Goals Review   Personal Goals Review Sedentary;Increase Strength and Stamina;Lipids;Hypertension       Review Pat sFraser Dined she cannot tell of any specific changes in her stamina or strength from participating in [facility] DDD  and spinal stenosis limit her ability to increase her work load and aerobic activity.  Pat dFraser Dinstate she is sleeping better since exercising on a regular basis at [facility] Rehab.  Cholesterol is controlled with current meds.  Pat hFraser Dinnot had cholesterol checked since starting Cardiac Rehab.  HTN:  BP upon check in has been ranging 126/70 - 140/80.  Upon check  out BP has been 122/82 - 156/84.  Pat wanted to try the treadmill today.  She was on it for 2 minutes at a speed of 0.5 mph and had to get off as walking of  TM, as walking on TM was causing right knee pain according to patient.  Pat unable to take nonsteroidal pain meds due to being on blood thinner.  Kayla Gray takes tylenol which doesn't help her DDD nor her right knee discomfort.  Kayla Gray has completed 15 sessions for Cardiac Rehab thus far.            Core Components/Risk Factors/Patient Goals at Discharge (Final Review):      Goals and Risk Factor Review - 02/09/16 1734    Core Components/Risk Factors/Patient Goals Review   Personal Goals Review Sedentary;Increase Strength and Stamina;Lipids;Hypertension   Review Kayla Gray stated she cannot tell of any specific changes in her stamina or strength from participating in [place], as her DDD and spinal stenosis limit her ability to increase her work load and aerobic activity.  Kayla Gray did state she is sleeping better since exercising on a regular basis at Cardiac Rehab.  Cholesterol is controlled with current meds.  Kayla Gray has not had cholesterol checked since starting Cardiac Rehab.  HTN:  BP upon check in has been ranging 126/70 - 140/80.  Upon check out BP has been 122/82 - 156/84.  Pat wanted to try the treadmill today.  She was on it for 2 minutes at a speed of 0.5 mph and had to get off as walking of  TM, as walking on TM was causing right knee pain according to patient.  Pat unable to take nonsteroidal pain meds due to being on blood thinner.  Kayla Gray takes tylenol which doesn't help her DDD nor her right knee  discomfort.  Kayla Gray has completed 15 sessions for Cardiac Rehab thus far.        ITP Comments:     ITP Comments      01/26/16 1715 02/05/16 1040 03/04/16 1047       ITP Comments Pat reports that she is tired at times but overall she feels better. Kayla Gray said that she goes to her doctor every 6 months. She said she is doing well on her cholestrol medicine and her blood work gets checked yearly. Her blood pressure is very good.  30 Day review. Continue with ITP  six visits since orientation 30 day review. Continue with ITP        Comments: Patient completed the Cardiac Rehab program today.  Pat completed 36/36 sessions.  She is planning to take a trip to the beach and upon return will join a gym.  She is thinking about joining [employer] program.  Kayla Gray stated that she is definitely walking better since participating in this program.

## 2016-03-21 ENCOUNTER — Ambulatory Visit (INDEPENDENT_AMBULATORY_CARE_PROVIDER_SITE_OTHER): Payer: PPO

## 2016-03-21 DIAGNOSIS — I6310 Cerebral infarction due to embolism of unspecified precerebral artery: Secondary | ICD-10-CM

## 2016-03-21 DIAGNOSIS — Z954 Presence of other heart-valve replacement: Secondary | ICD-10-CM | POA: Diagnosis not present

## 2016-03-21 DIAGNOSIS — Z5181 Encounter for therapeutic drug level monitoring: Secondary | ICD-10-CM | POA: Diagnosis not present

## 2016-03-21 DIAGNOSIS — Z953 Presence of xenogenic heart valve: Secondary | ICD-10-CM

## 2016-03-21 DIAGNOSIS — I350 Nonrheumatic aortic (valve) stenosis: Secondary | ICD-10-CM

## 2016-03-21 LAB — POCT INR: INR: 2.2

## 2016-03-28 ENCOUNTER — Other Ambulatory Visit: Payer: Self-pay | Admitting: Cardiovascular Disease

## 2016-04-09 DIAGNOSIS — M4726 Other spondylosis with radiculopathy, lumbar region: Secondary | ICD-10-CM | POA: Diagnosis not present

## 2016-04-09 DIAGNOSIS — M4806 Spinal stenosis, lumbar region: Secondary | ICD-10-CM | POA: Diagnosis not present

## 2016-04-09 DIAGNOSIS — M5416 Radiculopathy, lumbar region: Secondary | ICD-10-CM | POA: Diagnosis not present

## 2016-04-12 DIAGNOSIS — R269 Unspecified abnormalities of gait and mobility: Secondary | ICD-10-CM | POA: Diagnosis not present

## 2016-04-12 DIAGNOSIS — I471 Supraventricular tachycardia: Secondary | ICD-10-CM | POA: Diagnosis not present

## 2016-04-12 DIAGNOSIS — G4733 Obstructive sleep apnea (adult) (pediatric): Secondary | ICD-10-CM | POA: Diagnosis not present

## 2016-04-23 ENCOUNTER — Other Ambulatory Visit: Payer: Self-pay | Admitting: Family Medicine

## 2016-04-23 ENCOUNTER — Ambulatory Visit
Admission: RE | Admit: 2016-04-23 | Discharge: 2016-04-23 | Disposition: A | Discharge status: Home / Self Care | Payer: PPO | Source: Ambulatory Visit | Attending: Family Medicine | Admitting: Family Medicine

## 2016-04-23 DIAGNOSIS — Z1231 Encounter for screening mammogram for malignant neoplasm of breast: Secondary | ICD-10-CM | POA: Diagnosis not present

## 2016-04-25 ENCOUNTER — Ambulatory Visit (INDEPENDENT_AMBULATORY_CARE_PROVIDER_SITE_OTHER): Payer: PPO | Admitting: *Deleted

## 2016-04-25 DIAGNOSIS — Z5181 Encounter for therapeutic drug level monitoring: Secondary | ICD-10-CM

## 2016-04-25 DIAGNOSIS — Z953 Presence of xenogenic heart valve: Secondary | ICD-10-CM

## 2016-04-25 DIAGNOSIS — I6310 Cerebral infarction due to embolism of unspecified precerebral artery: Secondary | ICD-10-CM

## 2016-04-25 DIAGNOSIS — Z954 Presence of other heart-valve replacement: Secondary | ICD-10-CM

## 2016-04-25 DIAGNOSIS — I350 Nonrheumatic aortic (valve) stenosis: Secondary | ICD-10-CM | POA: Diagnosis not present

## 2016-04-25 LAB — POCT INR: INR: 1.8

## 2016-05-01 DIAGNOSIS — Z79899 Other long term (current) drug therapy: Secondary | ICD-10-CM | POA: Diagnosis not present

## 2016-05-01 DIAGNOSIS — E7800 Pure hypercholesterolemia, unspecified: Secondary | ICD-10-CM | POA: Diagnosis not present

## 2016-05-07 DIAGNOSIS — K219 Gastro-esophageal reflux disease without esophagitis: Secondary | ICD-10-CM | POA: Diagnosis not present

## 2016-05-07 DIAGNOSIS — Z9989 Dependence on other enabling machines and devices: Secondary | ICD-10-CM | POA: Diagnosis not present

## 2016-05-07 DIAGNOSIS — E7800 Pure hypercholesterolemia, unspecified: Secondary | ICD-10-CM | POA: Diagnosis not present

## 2016-05-07 DIAGNOSIS — J309 Allergic rhinitis, unspecified: Secondary | ICD-10-CM | POA: Diagnosis not present

## 2016-05-07 DIAGNOSIS — Z79899 Other long term (current) drug therapy: Secondary | ICD-10-CM | POA: Diagnosis not present

## 2016-05-07 DIAGNOSIS — F411 Generalized anxiety disorder: Secondary | ICD-10-CM | POA: Diagnosis not present

## 2016-05-07 DIAGNOSIS — G4733 Obstructive sleep apnea (adult) (pediatric): Secondary | ICD-10-CM | POA: Diagnosis not present

## 2016-05-14 ENCOUNTER — Ambulatory Visit (INDEPENDENT_AMBULATORY_CARE_PROVIDER_SITE_OTHER): Payer: PPO

## 2016-05-14 ENCOUNTER — Ambulatory Visit: Payer: PPO | Admitting: Cardiovascular Disease

## 2016-05-14 DIAGNOSIS — I350 Nonrheumatic aortic (valve) stenosis: Secondary | ICD-10-CM

## 2016-05-14 DIAGNOSIS — Z954 Presence of other heart-valve replacement: Secondary | ICD-10-CM | POA: Diagnosis not present

## 2016-05-14 DIAGNOSIS — I6310 Cerebral infarction due to embolism of unspecified precerebral artery: Secondary | ICD-10-CM | POA: Diagnosis not present

## 2016-05-14 DIAGNOSIS — Z5181 Encounter for therapeutic drug level monitoring: Secondary | ICD-10-CM

## 2016-05-14 DIAGNOSIS — Z953 Presence of xenogenic heart valve: Secondary | ICD-10-CM

## 2016-05-14 LAB — POCT INR: INR: 2.2

## 2016-05-16 DIAGNOSIS — Z7901 Long term (current) use of anticoagulants: Secondary | ICD-10-CM | POA: Diagnosis not present

## 2016-05-17 ENCOUNTER — Ambulatory Visit: Payer: PPO | Admitting: Cardiovascular Disease

## 2016-05-17 DIAGNOSIS — M5416 Radiculopathy, lumbar region: Secondary | ICD-10-CM | POA: Diagnosis not present

## 2016-05-17 DIAGNOSIS — M4806 Spinal stenosis, lumbar region: Secondary | ICD-10-CM | POA: Diagnosis not present

## 2016-06-06 DIAGNOSIS — Z7901 Long term (current) use of anticoagulants: Secondary | ICD-10-CM | POA: Diagnosis not present

## 2016-06-07 DIAGNOSIS — M5416 Radiculopathy, lumbar region: Secondary | ICD-10-CM | POA: Diagnosis not present

## 2016-06-07 DIAGNOSIS — M4806 Spinal stenosis, lumbar region: Secondary | ICD-10-CM | POA: Diagnosis not present

## 2016-06-13 ENCOUNTER — Ambulatory Visit (INDEPENDENT_AMBULATORY_CARE_PROVIDER_SITE_OTHER): Payer: PPO | Admitting: *Deleted

## 2016-06-13 DIAGNOSIS — I350 Nonrheumatic aortic (valve) stenosis: Secondary | ICD-10-CM | POA: Diagnosis not present

## 2016-06-13 DIAGNOSIS — I6310 Cerebral infarction due to embolism of unspecified precerebral artery: Secondary | ICD-10-CM

## 2016-06-13 DIAGNOSIS — Z954 Presence of other heart-valve replacement: Secondary | ICD-10-CM | POA: Diagnosis not present

## 2016-06-13 DIAGNOSIS — Z5181 Encounter for therapeutic drug level monitoring: Secondary | ICD-10-CM

## 2016-06-13 DIAGNOSIS — Z953 Presence of xenogenic heart valve: Secondary | ICD-10-CM

## 2016-06-13 LAB — POCT INR: INR: 2.7

## 2016-06-25 ENCOUNTER — Ambulatory Visit (INDEPENDENT_AMBULATORY_CARE_PROVIDER_SITE_OTHER): Payer: PPO

## 2016-06-25 ENCOUNTER — Encounter: Payer: Self-pay | Admitting: Cardiovascular Disease

## 2016-06-25 ENCOUNTER — Ambulatory Visit (INDEPENDENT_AMBULATORY_CARE_PROVIDER_SITE_OTHER): Payer: PPO | Admitting: Cardiovascular Disease

## 2016-06-25 VITALS — BP 128/70 | HR 62 | Ht 62.0 in | Wt 170.5 lb

## 2016-06-25 DIAGNOSIS — I6310 Cerebral infarction due to embolism of unspecified precerebral artery: Secondary | ICD-10-CM

## 2016-06-25 DIAGNOSIS — Z953 Presence of xenogenic heart valve: Secondary | ICD-10-CM

## 2016-06-25 DIAGNOSIS — Z5181 Encounter for therapeutic drug level monitoring: Secondary | ICD-10-CM

## 2016-06-25 DIAGNOSIS — I480 Paroxysmal atrial fibrillation: Secondary | ICD-10-CM

## 2016-06-25 DIAGNOSIS — G459 Transient cerebral ischemic attack, unspecified: Secondary | ICD-10-CM

## 2016-06-25 DIAGNOSIS — I350 Nonrheumatic aortic (valve) stenosis: Secondary | ICD-10-CM | POA: Diagnosis not present

## 2016-06-25 DIAGNOSIS — I471 Supraventricular tachycardia: Secondary | ICD-10-CM | POA: Diagnosis not present

## 2016-06-25 DIAGNOSIS — Z954 Presence of other heart-valve replacement: Secondary | ICD-10-CM

## 2016-06-25 DIAGNOSIS — H0015 Chalazion left lower eyelid: Secondary | ICD-10-CM | POA: Diagnosis not present

## 2016-06-25 DIAGNOSIS — Z9841 Cataract extraction status, right eye: Secondary | ICD-10-CM | POA: Diagnosis not present

## 2016-06-25 DIAGNOSIS — Z9842 Cataract extraction status, left eye: Secondary | ICD-10-CM | POA: Diagnosis not present

## 2016-06-25 LAB — POCT INR: INR: 1.9

## 2016-06-25 NOTE — Patient Instructions (Addendum)
Medication Instructions:   No medication changes made  Ask Dr. Loney Hering about tramadol  Labwork:  No new labs needed  Testing/Procedures:  No further testing at this time   Follow-Up: It was a pleasure seeing you in the office today. Please call us if you have new issues that need to be addressed before your next appt.  [phone]  Your physician wants you to follow-up in: 12 months.  You will receive a reminder letter in the mail two months in advance. If you don't receive a letter, please call our office to schedule the follow-up appointment.  If you need a refill on your cardiac medications before your next appointment, please call your pharmacy.

## 2016-06-25 NOTE — Progress Notes (Signed)
Cardiology Office Note  Date:  06/25/2016   ID:  Fay Records, DOB 1936/02/01, MRN [id number]  PCP:  Marcello Fennel, MD   Chief Complaint  Patient presents with  . Other    No complaints today. Meds reviewed verbally with pt.    HPI:  Ms. Canham is a very pleasant 80 year old woman with history of severe aortic valve stenosis, status post aVR with bioprosthetic valve 09/16/2015 Who presents for routine follow-up of her bioprosthetic aortic valve history of SVT, syncope, hyperlipidemia, iron deficiency anemia, peptic ulcer disease Status post left total knee replacement 11/15/2011 Notes provided indicate a history of SVT in February 2013 while in rehabilitation requiring adenosine, diltiazem and beta blockers, admitted with SVT, elevated troponin felt secondary to demand ischemia from tachycardia. She was started on diltiazem 120 mg daily February 2015 with TIA-type symptoms, workup in the hospital, continued on aspirin and Plavix. 30 day monitor did not show significant arrhythmia  In follow-up today, she denies any significant arrhythmia She is bothered by some arthritis, No exercise program, reports her weight is going up  Amiodarone previously stopped by Dr. Roxy Manns Some trouble with Leg swelling, non pitting Did not participated in cardiac rehabilitation  Weight up 7 pounds from 6 months ago  EKG on today's visit shows normal sinus rhythm with rate 62 bpm, no significant ST or T-wave changes  Other past medical history  postoperative atrial fibrillation, started on amiodarone, warfarin The pars not had any problems with her anticoagulation  Echocardiogram after the surgery reviewed with the patient, showing well-seated aortic valve replacement  Previous echocardiogram showing Peak velocity 510 cm/s, mean gradient 54 mmHg, peak gradient 104 mmHg.   She had knee replacement surgery early 2016  Carotid ultrasound August 2012 showed no significant plaque  echocardiogram  01/ 8 2014 showed normal LV function, aortic valve velocity 315 cm/s, peak gradient 56 mm of mercury, mean gradient 19 mm of mercury, estimated aortic valve area 0.61 cm Echocardiogram in late 2014 showing velocity of 400   Stress test August 2012 showed normal EKG with exertion, normal stress echo  Lab work may 2015 showing total cholesterol 200, LDL 111   PMH:   has a past medical history of Aortic stenosis; Bilateral cataracts; Bleeding nose; Breast cancer ([place]) (1999); Chronic diastolic (congestive) heart failure ([place]); Depression; Diverticulosis; Gastroesophageal reflux disease; Heart murmur; Hyperlipidemia; Iron deficiency anemia; Obstructive sleep apnea; Osteoarthritis; Osteopenia; Peptic ulcer disease; S/P aortic valve replacement with bioprosthetic valve (09/16/2015); Stroke [facility]); Supraventricular tachycardia ([place]); Syncope and collapse; and Urinary tract infection.  PSH:    Past Surgical History:  Procedure Laterality Date  . AORTIC VALVE REPLACEMENT N/A 09/16/2015   Procedure: AORTIC VALVE REPLACEMENT (AVR);  Surgeon: Rexene Alberts, MD;  Location: [facility];  Service: Open Heart Surgery;  Laterality: N/A;  . BREAST BIOPSY Right 1999   breast ca radation  . BREAST EXCISIONAL BIOPSY Left yrs ago    benign  . CARDIAC CATHETERIZATION N/A 08/17/2015   Procedure: Left Heart Cath and Coronary Angiography;  Surgeon: Minna Merritts, MD;  Location: [facility];  Service: Cardiovascular;  Laterality: N/A;  . CATARACT EXTRACTION, BILATERAL    . CHOLECYSTECTOMY    . KNEE ARTHROSCOPY     right  . KNEE ARTHROSCOPY     left  . TEE WITHOUT CARDIOVERSION N/A 09/16/2015   Procedure: TRANSESOPHAGEAL ECHOCARDIOGRAM (TEE);  Surgeon: Rexene Alberts, MD;  Location: [facility];  Service: Open Heart Surgery;  Laterality: N/A;  .  TONSILLECTOMY    . VAGINAL HYSTERECTOMY      Current Outpatient Prescriptions  Medication Sig Dispense Refill  . acetaminophen (TYLENOL) 325 MG tablet Take 650 mg  by mouth every 6 (six) hours as needed.    . Calcium Carbonate-Vitamin D (CALCIUM 600 + D PO) Take by mouth 2 (two) times daily.    Marland Kitchen ezetimibe (ZETIA) 10 MG tablet Take 10 mg by mouth daily.    . fluticasone (FLONASE) 50 MCG/ACT nasal spray Place 2 sprays into the nose daily as needed.    . metoprolol tartrate (LOPRESSOR) 25 MG tablet Take 1 tablet (25 mg total) by mouth 2 (two) times daily. 180 tablet 2  . Multiple Vitamin (MULTIVITAMIN) tablet Take 1 tablet by mouth daily.    . NON FORMULARY at bedtime. CPAP    . pantoprazole (PROTONIX) 40 MG tablet Take 40 mg by mouth daily.     Marland Kitchen PARoxetine (PAXIL) 20 MG tablet Take 20 mg by mouth daily.    Marland Kitchen warfarin (COUMADIN) 2.5 MG tablet Take 1 tablet (2.5 mg total) by mouth daily at 6 PM. Or as directed by Dr. [name] (Patient taking differently: Take 2.5 mg by mouth daily. Or as directed by Dr. [name]) 30 tablet 1  . warfarin (COUMADIN) 2.5 MG tablet Take 2.5 mg by mouth daily. Reported on 01/25/2016    . warfarin (COUMADIN) 2.5 MG tablet TAKE 1 TABLET (2.5 MG TOTAL) BY MOUTH DAILY AT 6 PM. OR AS DIRECTED BY DR. [name] 55 tablet 3   No current facility-administered medications for this visit.      Allergies:   Codeine; Evista [raloxifene]; Statins; and Sulfa antibiotics   Social History:  The patient  reports that she has never smoked. She has never used smokeless tobacco. She reports that she drinks alcohol. She reports that she does not use drugs.   Family History:   family history includes Heart failure in her mother.    Review of Systems: Review of Systems  Constitutional: Negative.   Respiratory: Negative.   Cardiovascular: Positive for leg swelling.  Gastrointestinal: Negative.   Musculoskeletal: Positive for joint pain.  Neurological: Negative.   Psychiatric/Behavioral: Negative.   All other systems reviewed and are negative.    PHYSICAL EXAM: VS:  BP 128/70 (BP Location: Left Arm, Patient Position: Sitting, Cuff Size: Normal)    Pulse 62   Ht 5\' 2"  (1.575 m)   Wt 170 lb 8 oz (77.3 kg)   BMI 31.18 kg/m  , BMI Body mass index is 31.18 kg/m. GEN: Well nourished, well developed, in no acute distress  HEENT: normal  Neck: no JVD, carotid bruits, or masses Cardiac: RRR; no murmurs, rubs, or gallops,no edema  Respiratory:  clear to auscultation bilaterally, normal work of breathing GI: soft, nontender, nondistended, + BS MS: no deformity or atrophy  Skin: warm and dry, no rash Neuro:  Strength and sensation are intact Psych: euthymic mood, full affect    Recent Labs: 09/09/2015: ALT 15 09/17/2015: Magnesium 2.5 09/20/2015: BUN 17; Creatinine, Ser 0.86; Hemoglobin 8.4; Platelets 105; Potassium 3.5; Sodium 139    Lipid Panel Lab Results  Component Value Date   CHOL 202 (H) 11/17/2013   HDL 41 11/17/2013   LDLCALC 129 (H) 11/17/2013   TRIG 159 11/17/2013      Wt Readings from Last 3 Encounters:  06/25/16 170 lb 8 oz (77.3 kg)  03/07/16 167 lb 11.2 oz (76.1 kg)  01/18/16 163 lb 12.8 oz (74.3 kg)  ASSESSMENT AND PLAN:  SVT (supraventricular tachycardia) ([place]) - Plan: EKG 12-Lead Denies any significant arrhythmia. We'll continue metoprolol Further workup if she has additional symptoms  Paroxysmal atrial fibrillation ([place]) - Plan: EKG 12-Lead Postoperative atrial fibrillation, off amiodarone, still on warfarin given history of TIA  Aortic valve stenosis, S/P aortic valve replacement with bioprosthetic valve Recovered well from her valve surgery Last echocardiogram December 2016, 23 mm Edwards intuity elite tissue valve  Transient cerebral ischemia, unspecified transient cerebral ischemia type Currently on warfarin, history of paroxysmal atrial fibrillation  Obesity Weight gain of 7 pounds. Recommended exercise program   Total encounter time more than 25 minutes  Greater than 50% was spent in counseling and coordination of care with the patient   Disposition:   F/U  6  months   Orders Placed This Encounter  Procedures  . EKG 12-Lead     Signed, Esmond Plants, M.D., Ph.D. 06/25/2016  [place], [place]

## 2016-06-28 DIAGNOSIS — Z85828 Personal history of other malignant neoplasm of skin: Secondary | ICD-10-CM | POA: Diagnosis not present

## 2016-06-28 DIAGNOSIS — D225 Melanocytic nevi of trunk: Secondary | ICD-10-CM | POA: Diagnosis not present

## 2016-06-28 DIAGNOSIS — D2262 Melanocytic nevi of left upper limb, including shoulder: Secondary | ICD-10-CM | POA: Diagnosis not present

## 2016-06-28 DIAGNOSIS — L570 Actinic keratosis: Secondary | ICD-10-CM | POA: Diagnosis not present

## 2016-06-28 DIAGNOSIS — X32XXXA Exposure to sunlight, initial encounter: Secondary | ICD-10-CM | POA: Diagnosis not present

## 2016-06-28 DIAGNOSIS — D2271 Melanocytic nevi of right lower limb, including hip: Secondary | ICD-10-CM | POA: Diagnosis not present

## 2016-07-10 DIAGNOSIS — L600 Ingrowing nail: Secondary | ICD-10-CM | POA: Diagnosis not present

## 2016-07-10 DIAGNOSIS — L03031 Cellulitis of right toe: Secondary | ICD-10-CM | POA: Diagnosis not present

## 2016-07-11 ENCOUNTER — Ambulatory Visit (INDEPENDENT_AMBULATORY_CARE_PROVIDER_SITE_OTHER): Payer: PPO

## 2016-07-11 DIAGNOSIS — I350 Nonrheumatic aortic (valve) stenosis: Secondary | ICD-10-CM | POA: Diagnosis not present

## 2016-07-11 DIAGNOSIS — I6310 Cerebral infarction due to embolism of unspecified precerebral artery: Secondary | ICD-10-CM

## 2016-07-11 DIAGNOSIS — Z5181 Encounter for therapeutic drug level monitoring: Secondary | ICD-10-CM | POA: Diagnosis not present

## 2016-07-11 DIAGNOSIS — Z954 Presence of other heart-valve replacement: Secondary | ICD-10-CM | POA: Diagnosis not present

## 2016-07-11 DIAGNOSIS — Z953 Presence of xenogenic heart valve: Secondary | ICD-10-CM

## 2016-07-11 LAB — POCT INR: INR: 1.8

## 2016-07-12 DIAGNOSIS — M25561 Pain in right knee: Secondary | ICD-10-CM | POA: Diagnosis not present

## 2016-07-12 DIAGNOSIS — M7050 Other bursitis of knee, unspecified knee: Secondary | ICD-10-CM | POA: Diagnosis not present

## 2016-07-19 ENCOUNTER — Other Ambulatory Visit: Payer: Self-pay | Admitting: Cardiovascular Disease

## 2016-07-19 NOTE — Telephone Encounter (Signed)
Please review for refill. Thanks!  

## 2016-07-23 DIAGNOSIS — M48062 Spinal stenosis, lumbar region with neurogenic claudication: Secondary | ICD-10-CM | POA: Diagnosis not present

## 2016-07-23 DIAGNOSIS — M4726 Other spondylosis with radiculopathy, lumbar region: Secondary | ICD-10-CM | POA: Diagnosis not present

## 2016-07-23 DIAGNOSIS — M5416 Radiculopathy, lumbar region: Secondary | ICD-10-CM | POA: Diagnosis not present

## 2016-07-25 ENCOUNTER — Ambulatory Visit (INDEPENDENT_AMBULATORY_CARE_PROVIDER_SITE_OTHER): Payer: PPO

## 2016-07-25 DIAGNOSIS — I350 Nonrheumatic aortic (valve) stenosis: Secondary | ICD-10-CM | POA: Diagnosis not present

## 2016-07-25 DIAGNOSIS — I6310 Cerebral infarction due to embolism of unspecified precerebral artery: Secondary | ICD-10-CM

## 2016-07-25 DIAGNOSIS — Z953 Presence of xenogenic heart valve: Secondary | ICD-10-CM

## 2016-07-25 DIAGNOSIS — Z5181 Encounter for therapeutic drug level monitoring: Secondary | ICD-10-CM

## 2016-07-25 LAB — POCT INR: INR: 2

## 2016-08-03 ENCOUNTER — Emergency Department: Payer: PPO

## 2016-08-03 ENCOUNTER — Emergency Department
Admission: EM | Admit: 2016-08-03 | Discharge: 2016-08-03 | Disposition: A | Discharge status: Home / Self Care | Payer: PPO | Attending: Emergency Medicine | Admitting: Emergency Medicine

## 2016-08-03 ENCOUNTER — Encounter: Payer: Self-pay | Admitting: Emergency Medicine

## 2016-08-03 DIAGNOSIS — Z853 Personal history of malignant neoplasm of breast: Secondary | ICD-10-CM | POA: Insufficient documentation

## 2016-08-03 DIAGNOSIS — I5032 Chronic diastolic (congestive) heart failure: Secondary | ICD-10-CM | POA: Insufficient documentation

## 2016-08-03 DIAGNOSIS — Y9389 Activity, other specified: Secondary | ICD-10-CM | POA: Diagnosis not present

## 2016-08-03 DIAGNOSIS — Y999 Unspecified external cause status: Secondary | ICD-10-CM | POA: Insufficient documentation

## 2016-08-03 DIAGNOSIS — M542 Cervicalgia: Secondary | ICD-10-CM | POA: Diagnosis not present

## 2016-08-03 DIAGNOSIS — S0990XA Unspecified injury of head, initial encounter: Secondary | ICD-10-CM | POA: Diagnosis not present

## 2016-08-03 DIAGNOSIS — Z7901 Long term (current) use of anticoagulants: Secondary | ICD-10-CM | POA: Diagnosis not present

## 2016-08-03 DIAGNOSIS — S0083XA Contusion of other part of head, initial encounter: Secondary | ICD-10-CM | POA: Diagnosis not present

## 2016-08-03 DIAGNOSIS — S199XXA Unspecified injury of neck, initial encounter: Secondary | ICD-10-CM | POA: Diagnosis not present

## 2016-08-03 DIAGNOSIS — S8992XA Unspecified injury of left lower leg, initial encounter: Secondary | ICD-10-CM | POA: Diagnosis not present

## 2016-08-03 DIAGNOSIS — M25562 Pain in left knee: Secondary | ICD-10-CM | POA: Diagnosis not present

## 2016-08-03 DIAGNOSIS — W19XXXA Unspecified fall, initial encounter: Secondary | ICD-10-CM

## 2016-08-03 DIAGNOSIS — W01198A Fall on same level from slipping, tripping and stumbling with subsequent striking against other object, initial encounter: Secondary | ICD-10-CM | POA: Diagnosis not present

## 2016-08-03 DIAGNOSIS — M25561 Pain in right knee: Secondary | ICD-10-CM | POA: Diagnosis not present

## 2016-08-03 DIAGNOSIS — S8991XA Unspecified injury of right lower leg, initial encounter: Secondary | ICD-10-CM | POA: Diagnosis not present

## 2016-08-03 DIAGNOSIS — S069X9A Unspecified intracranial injury with loss of consciousness of unspecified duration, initial encounter: Secondary | ICD-10-CM | POA: Diagnosis not present

## 2016-08-03 DIAGNOSIS — Y92511 Restaurant or cafe as the place of occurrence of the external cause: Secondary | ICD-10-CM | POA: Diagnosis not present

## 2016-08-03 DIAGNOSIS — Z79899 Other long term (current) drug therapy: Secondary | ICD-10-CM | POA: Diagnosis not present

## 2016-08-03 DIAGNOSIS — S060X1A Concussion with loss of consciousness of 30 minutes or less, initial encounter: Secondary | ICD-10-CM | POA: Diagnosis not present

## 2016-08-03 LAB — CBC
HCT: 39.5 % (ref 35.0–47.0)
HEMOGLOBIN: 13.3 g/dL (ref 12.0–16.0)
MCH: 28.8 pg (ref 26.0–34.0)
MCHC: 33.6 g/dL (ref 32.0–36.0)
MCV: 85.7 fL (ref 80.0–100.0)
Platelets: 144 10*3/uL — ABNORMAL LOW (ref 150–440)
RBC: 4.61 MIL/uL (ref 3.80–5.20)
RDW: 14.1 % (ref 11.5–14.5)
WBC: 5.9 10*3/uL (ref 3.6–11.0)

## 2016-08-03 LAB — BASIC METABOLIC PANEL
ANION GAP: 9 (ref 5–15)
BUN: 20 mg/dL (ref 6–20)
CHLORIDE: 106 mmol/L (ref 101–111)
CO2: 23 mmol/L (ref 22–32)
CREATININE: 1.06 mg/dL — AB (ref 0.44–1.00)
Calcium: 9 mg/dL (ref 8.9–10.3)
GFR calc non Af Amer: 49 mL/min — ABNORMAL LOW (ref >60)
GFR, EST AFRICAN AMERICAN: 57 mL/min — AB (ref >60)
GLUCOSE: 209 mg/dL — AB (ref 65–99)
Potassium: 4 mmol/L (ref 3.5–5.1)
Sodium: 138 mmol/L (ref 135–145)

## 2016-08-03 MED ORDER — SODIUM CHLORIDE 0.9 % IV BOLUS (SEPSIS)
1000.0000 mL | Freq: Once | INTRAVENOUS | Status: AC
  Administered 2016-08-03: 1000 mL via INTRAVENOUS

## 2016-08-03 MED ORDER — ACETAMINOPHEN 500 MG PO TABS
1000.0000 mg | ORAL_TABLET | Freq: Once | ORAL | Status: AC
  Administered 2016-08-03: 1000 mg via ORAL
  Filled 2016-08-03: qty 2

## 2016-08-03 NOTE — Discharge Instructions (Signed)
Follow-up with her doctor in 2 days. Return to the emergency room if he develops severe headache, facial droop, changes in vision, weakness or numbness of the extremities, difficulty finding words, chest pain, back pain, shortness of breath, abdominal pain. Rest for the next day. Take Tylenol for her headache.

## 2016-08-03 NOTE — ED Provider Notes (Signed)
[facility] Emergency Department Provider Note  ____________________________________________  Time seen: Approximately 5:12 PM  I have reviewed the triage vital signs and the nursing notes.   HISTORY  Chief Complaint Loss of Consciousness   HPI Kayla Gray is a 80 y.o. female the history of aortic stenosis s/p bioprosthetic replacement in 2016 on coumadin, hyperlipidemia, OSA, peptic ulcer disease, TIA who presents for evaluation of head trauma. Patient reports that she was at a restaurant eating with her husband. She felt that she was about to have a bowel movement and rushing to the bathroom. She was getting into the stall when she slipped and fell. She hit her head onto the wall and passed out. She reports that she was incontinent of stool however she felt like she was about to have diarrhea and that is why she went to the bathroom in the first place. Patient denies palpitations, headache, chest pain, shortness of breath, abdominal pain, or chest pain preceding the fall. She currently endorses a moderate headache after sustaining a left forehead hematoma. She is also complaining of mild pain in her bilateral knees. She denies chest pain, palpitations, shortness of breath at this time.  Past Medical History:  Diagnosis Date  . Aortic stenosis   . Bilateral cataracts   . Bleeding nose    Thurs night (09/08/15) and Fri (09/09/15) left side.Bleeding didn't last long  . Breast cancer ([place]) 1999   right breast lumpectomy and rad tx  . Chronic diastolic (congestive) heart failure   . Depression   . Diverticulosis   . Gastroesophageal reflux disease   . Heart murmur   . Hyperlipidemia   . Iron deficiency anemia   . Obstructive sleep apnea   . Osteoarthritis   . Osteopenia   . Peptic ulcer disease   . S/P aortic valve replacement with bioprosthetic valve 09/16/2015   23 mm Edwards Intuity Elite bioprosthetic tissue valve  . Stroke ([place])    11/14/13  .  Supraventricular tachycardia ([place])   . Syncope and collapse   . Urinary tract infection     Patient Active Problem List   Diagnosis Date Noted  . Paroxysmal atrial fibrillation ([place]) 11/14/2015  . Encounter for therapeutic drug monitoring 09/29/2015  . S/P aortic valve replacement with bioprosthetic valve 09/16/2015  . Chronic diastolic (congestive) heart failure   . SOB (shortness of breath) 08/17/2015  . Angina pectoris ([place]) 08/17/2015  . Severe aortic valve stenosis 08/12/2015  . Low back pain 08/12/2015  . Sinus tachycardia 01/06/2014  . Osteoarthritis of both knees 01/06/2014  . CVA (cerebral vascular accident) ([place]) 11/25/2013  . Aortic valve stenosis 11/27/2012  . SVT (supraventricular tachycardia) ([place]) 11/27/2012  . Syncope 11/27/2012    Past Surgical History:  Procedure Laterality Date  . AORTIC VALVE REPLACEMENT N/A 09/16/2015   Procedure: AORTIC VALVE REPLACEMENT (AVR);  Surgeon: Rexene Alberts, MD;  Location: [facility];  Service: Open Heart Surgery;  Laterality: N/A;  . BREAST BIOPSY Right 1999   breast ca radation  . BREAST EXCISIONAL BIOPSY Left yrs ago    benign  . CARDIAC CATHETERIZATION N/A 08/17/2015   Procedure: Left Heart Cath and Coronary Angiography;  Surgeon: Minna Merritts, MD;  Location: [facility];  Service: Cardiovascular;  Laterality: N/A;  . CATARACT EXTRACTION, BILATERAL    . CHOLECYSTECTOMY    . KNEE ARTHROSCOPY     right  . KNEE ARTHROSCOPY     left  . TEE WITHOUT CARDIOVERSION N/A  09/16/2015   Procedure: TRANSESOPHAGEAL ECHOCARDIOGRAM (TEE);  Surgeon: Rexene Alberts, MD;  Location: [facility];  Service: Open Heart Surgery;  Laterality: N/A;  . TONSILLECTOMY    . VAGINAL HYSTERECTOMY      Prior to Admission medications   Medication Sig Start Date End Date Taking? Authorizing Provider  acetaminophen (TYLENOL) 325 MG tablet Take 650 mg by mouth every 6 (six) hours as needed.    Historical Provider, MD  Calcium Carbonate-Vitamin D  (CALCIUM 600 + D PO) Take by mouth 2 (two) times daily.    Historical Provider, MD  ezetimibe (ZETIA) 10 MG tablet Take 10 mg by mouth daily.    Historical Provider, MD  fluticasone (FLONASE) 50 MCG/ACT nasal spray Place 2 sprays into the nose daily as needed.    Historical Provider, MD  metoprolol tartrate (LOPRESSOR) 25 MG tablet Take 1 tablet (25 mg total) by mouth 2 (two) times daily. 03/28/16   Minna Merritts, MD  Multiple Vitamin (MULTIVITAMIN) tablet Take 1 tablet by mouth daily.    Historical Provider, MD  NON FORMULARY at bedtime. CPAP    Historical Provider, MD  pantoprazole (PROTONIX) 40 MG tablet Take 40 mg by mouth daily.  07/04/14   Historical Provider, MD  PARoxetine (PAXIL) 20 MG tablet Take 20 mg by mouth daily.    Historical Provider, MD  warfarin (COUMADIN) 2.5 MG tablet Take 1 tablet (2.5 mg total) by mouth daily at 6 PM. Or as directed by Dr. [name] Patient taking differently: Take 2.5 mg by mouth daily. Or as directed by Dr. [name] 09/22/15   Nani Skillern, PA-C  warfarin (COUMADIN) 2.5 MG tablet Take 2.5 mg by mouth daily. Reported on 01/25/2016    Historical Provider, MD  warfarin (COUMADIN) 2.5 MG tablet TAKE 1 TABLET (2.5 MG TOTAL) BY MOUTH DAILY AT 6 PM. OR AS DIRECTED BY DR. [name] 07/19/16   Minna Merritts, MD    Allergies Codeine; Evista [raloxifene]; Statins; and Sulfa antibiotics  Family History  Problem Relation Age of Onset  . Heart failure Mother     Social History Social History  Substance Use Topics  . Smoking status: Never Smoker  . Smokeless tobacco: Never Used  . Alcohol use Yes     Comment: wine    Review of Systems Constitutional: Negative for fever. Eyes: Negative for visual changes. ENT: Negative for facial injury or neck injury Cardiovascular: Negative for chest injury. Respiratory: Negative for shortness of breath. Negative for chest wall injury. Gastrointestinal: Negative for abdominal pain or injury. Genitourinary: Negative  for dysuria. Musculoskeletal: Negative for back injury, negative for arm or leg pain. + b/l knee pain Skin: Negative for laceration/abrasions. Neurological: + head injury.   ____________________________________________   PHYSICAL EXAM:  VITAL SIGNS: ED Triage Vitals  Enc Vitals Group     BP 08/03/16 1445 134/72     Pulse Rate 08/03/16 1445 79     Resp 08/03/16 1445 18     Temp 08/03/16 1445 97.7 F (36.5 C)     Temp Source 08/03/16 1445 Oral     SpO2 08/03/16 1445 100 %     Weight 08/03/16 1443 170 lb (77.1 kg)     Height 08/03/16 1443 5\' 2"  (1.575 m)     Head Circumference --      Peak Flow --      Pain Score 08/03/16 1443 6     Pain Loc --      Pain Edu? --  Excl. in [place]? --    Full spinal precautions maintained throughout the trauma exam. Constitutional: Alert and oriented. No acute distress. Does not appear intoxicated. HEENT Head: Normocephalic and R forehead hematoma. Face: No facial bony tenderness. Stable midface Ears: No hemotympanum bilaterally. No Battle sign Eyes: No eye injury. PERRL. No raccoon eyes Nose: Nontender. No epistaxis. No rhinorrhea Mouth/Throat: Mucous membranes are moist. No oropharyngeal blood. No dental injury. Airway patent without stridor. Normal voice. Neck: C-collar in place. No midline c-spine tenderness. Paraspinal ttp Cardiovascular: Normal rate, regular rhythm. Normal and symmetric distal pulses are present in all extremities. Pulmonary/Chest: Chest wall is stable and nontender to palpation/compression. Normal respiratory effort. Breath sounds are normal. No crepitus.  Abdominal: Soft, nontender, non distended. Musculoskeletal: Nontender with normal full range of motion in all extremities. No deformities. No thoracic or lumbar midline spinal tenderness. Pelvis is stable. Skin: Bruising noted on b/l knees Psychiatric: Speech and behavior are appropriate. Neurological: Normal speech and language. Moves all extremities to command. No  gross focal neurologic deficits are appreciated.  Glascow Coma Score: 4 - Opens eyes on own 6 - Follows simple motor commands 5 - Alert and oriented GCS: 15    ____________________________________________   LABS (all labs ordered are listed, but only abnormal results are displayed)  Labs Reviewed  BASIC METABOLIC PANEL - Abnormal; Notable for the following:       Result Value   Glucose, Bld 209 (*)    Creatinine, Ser 1.06 (*)    GFR calc non Af Amer 49 (*)    GFR calc Af Amer 57 (*)    All other components within normal limits  CBC - Abnormal; Notable for the following:    Platelets 144 (*)    All other components within normal limits  URINALYSIS COMPLETEWITH MICROSCOPIC ([facility] ONLY)   ____________________________________________  EKG  ED ECG REPORT I, Rudene Re, the attending physician, personally viewed and interpreted this ECG.  Normal sinus rhythm, rate of 89, normal intervals, normal axis, no ST elevations or depressions. ____________________________________________  RADIOLOGY  CT head and neck: Negative  XR b/l knees: Negative ____________________________________________   PROCEDURES  Procedure(s) performed: None Procedures Critical Care performed:  None ____________________________________________   INITIAL IMPRESSION / ASSESSMENT AND PLAN / ED COURSE   80 y.o. female the history of aortic stenosis s/p bioprosthetic replacement in 2016 on coumadin, hyperlipidemia, OSA, peptic ulcer disease, TIA who presents for evaluation of head trauma after falling in the bathroom of a restaurant, hitting her hed on the wall, and + brief LOC period. Patient is well-appearing, sustained a right forehead hematoma and bruising bilateral knees. She is neurologically intact, no signs of basilar skull fracture. No other injuries on exam. Her pain is mild so will treat with Tylenol 1000 mg. CT head and neck negative for acute findings. X-rays pending. Blood work  showing mildly elevated creatinine of 1.06, we'll give IV fluids.  Clinical Course   Negative XR of b/l knees. Patient received tylenol and IVF. Will dc home with f/u with PCP  Pertinent labs & imaging results that were available during my care of the patient were reviewed by me and considered in my medical decision making (see chart for details).    ____________________________________________   FINAL CLINICAL IMPRESSION(S) / ED DIAGNOSES  Final diagnoses:  Fall, initial encounter  Concussion with loss of consciousness of 30 minutes or less, initial encounter  Traumatic hematoma of forehead, initial encounter  Acute pain of both knees  NEW MEDICATIONS STARTED DURING THIS VISIT:  New Prescriptions   No medications on file     Note:  This document was prepared using Dragon voice recognition software and may include unintentional dictation errors.    Rudene Re, MD 08/03/16 1758

## 2016-08-03 NOTE — ED Triage Notes (Addendum)
States she fell prior arrival  Hit head and had a brief episode of LOC.. Did lose her bowels at that time  conts to have pain to forehead and "burning" type pain to right knee

## 2016-08-10 DIAGNOSIS — R55 Syncope and collapse: Secondary | ICD-10-CM | POA: Diagnosis not present

## 2016-08-10 DIAGNOSIS — S060X1D Concussion with loss of consciousness of 30 minutes or less, subsequent encounter: Secondary | ICD-10-CM | POA: Diagnosis not present

## 2016-08-10 DIAGNOSIS — S0083XD Contusion of other part of head, subsequent encounter: Secondary | ICD-10-CM | POA: Diagnosis not present

## 2016-08-10 DIAGNOSIS — Z23 Encounter for immunization: Secondary | ICD-10-CM | POA: Diagnosis not present

## 2016-08-15 ENCOUNTER — Ambulatory Visit (INDEPENDENT_AMBULATORY_CARE_PROVIDER_SITE_OTHER): Payer: PPO

## 2016-08-15 DIAGNOSIS — I350 Nonrheumatic aortic (valve) stenosis: Secondary | ICD-10-CM

## 2016-08-15 DIAGNOSIS — Z953 Presence of xenogenic heart valve: Secondary | ICD-10-CM | POA: Diagnosis not present

## 2016-08-15 DIAGNOSIS — I6310 Cerebral infarction due to embolism of unspecified precerebral artery: Secondary | ICD-10-CM | POA: Diagnosis not present

## 2016-08-15 DIAGNOSIS — Z5181 Encounter for therapeutic drug level monitoring: Secondary | ICD-10-CM

## 2016-08-15 LAB — POCT INR: INR: 1.7

## 2016-08-22 DIAGNOSIS — Z8673 Personal history of transient ischemic attack (TIA), and cerebral infarction without residual deficits: Secondary | ICD-10-CM | POA: Diagnosis not present

## 2016-08-22 DIAGNOSIS — R55 Syncope and collapse: Secondary | ICD-10-CM | POA: Diagnosis not present

## 2016-08-28 ENCOUNTER — Telehealth: Payer: Self-pay | Admitting: Cardiovascular Disease

## 2016-08-28 DIAGNOSIS — R55 Syncope and collapse: Secondary | ICD-10-CM | POA: Diagnosis not present

## 2016-08-28 DIAGNOSIS — R569 Unspecified convulsions: Secondary | ICD-10-CM | POA: Diagnosis not present

## 2016-08-28 NOTE — Telephone Encounter (Signed)
Spoke with pt and we discussed her refill, pharmacy called  And spoke with Kayla Gray,  they assumed that the 60 tabs sent in on 07/19/16 was a 60 day supply. Thus, new Rx was verbally given to Kayla Gray that specified that 60 tabs is a 30 day supply. Pt aware that Rx given to pharmacist and she can pick up later today as their computers are currently down.

## 2016-08-28 NOTE — Telephone Encounter (Signed)
°*  STAT* If patient is at the pharmacy, call can be transferred to refill team.   1. Which medications need to be refilled? (please list name of each medication and dose if known)  Warfarin (the change that we made, it was higher)  2. Which pharmacy/location (including street and city if local pharmacy) is medication to be sent to? [facility] [place]  3. Do they need a 30 day or 90 day supply? Just enough to last her until the next appointment

## 2016-08-29 ENCOUNTER — Other Ambulatory Visit: Payer: Self-pay | Admitting: Neurology

## 2016-08-29 DIAGNOSIS — I639 Cerebral infarction, unspecified: Secondary | ICD-10-CM

## 2016-08-29 DIAGNOSIS — R55 Syncope and collapse: Secondary | ICD-10-CM

## 2016-09-05 ENCOUNTER — Ambulatory Visit (INDEPENDENT_AMBULATORY_CARE_PROVIDER_SITE_OTHER): Payer: PPO

## 2016-09-05 DIAGNOSIS — Z5181 Encounter for therapeutic drug level monitoring: Secondary | ICD-10-CM | POA: Diagnosis not present

## 2016-09-05 DIAGNOSIS — Z953 Presence of xenogenic heart valve: Secondary | ICD-10-CM

## 2016-09-05 DIAGNOSIS — I350 Nonrheumatic aortic (valve) stenosis: Secondary | ICD-10-CM | POA: Diagnosis not present

## 2016-09-05 DIAGNOSIS — I6310 Cerebral infarction due to embolism of unspecified precerebral artery: Secondary | ICD-10-CM

## 2016-09-05 LAB — POCT INR: INR: 2

## 2016-09-05 MED ORDER — WARFARIN SODIUM 2.5 MG PO TABS: ORAL_TABLET | ORAL | 3 refills | Status: DC

## 2016-09-10 ENCOUNTER — Encounter: Payer: Self-pay | Admitting: Thoracic Surgery (Cardiothoracic Vascular Surgery)

## 2016-09-10 ENCOUNTER — Ambulatory Visit (INDEPENDENT_AMBULATORY_CARE_PROVIDER_SITE_OTHER): Payer: PPO | Admitting: Thoracic Surgery (Cardiothoracic Vascular Surgery)

## 2016-09-10 VITALS — BP 164/89 | HR 64 | Resp 16 | Ht 62.0 in | Wt 176.2 lb

## 2016-09-10 DIAGNOSIS — Z953 Presence of xenogenic heart valve: Secondary | ICD-10-CM

## 2016-09-10 DIAGNOSIS — I350 Nonrheumatic aortic (valve) stenosis: Secondary | ICD-10-CM | POA: Diagnosis not present

## 2016-09-10 NOTE — Progress Notes (Signed)
[address]       [place] [address]             [phone]     CARDIOTHORACIC SURGERY OFFICE NOTE  Referring Provider is [name], [name] November, MD PCP is [name], [name], MD   HPI:  Patient is a 80 year old female who returns to the office today for routine follow-up approximately one year status post aortic valve replacement using a 23 mm [employer] Intuity Elite bovine pericardial tissue valve on 09/16/2015. Her early postoperative recovery in the hospital was notable for recurrent paroxysmal atrial fibrillation for which she was treated with amiodarone and warfarin anticoagulation. She was last seen in our office on 01/09/2016 at which time she was doing well. Amiodarone was stopped. Since then she has been seen in follow-up by Dr. [name], most recently on 06/25/2016. In October she suffered a syncopal episode while she was out having supper at [employer]. She was treated in the emergency department at [facility] where brain CT scan was negative.  She has been seen in follow-up by her primary care physician since then and referred for a neurology consult. An MRI has been scheduled but not yet done. She has not been seen in follow-up by Dr. [name] since her syncopal event. She returns to our office today and reports that overall she is doing reasonably well. She is limited primarily due to chronic pain in her back and legs related to long-standing degenerative arthritis. She is not very active physically. She denies any symptoms of exertional shortness of breath or chest discomfort. She does report occasional dizzy spells that have occurred since her syncopal event. She denies any palpitations. She remains chronically anticoagulated using warfarin.   Current Outpatient Prescriptions  Medication Sig Dispense Refill  . acetaminophen (TYLENOL) 325 MG tablet Take 650 mg by mouth every 6 (six) hours as needed.    . Calcium Carbonate-Vitamin D (CALCIUM 600 + D PO) Take by mouth 2  (two) times daily.    Marland Kitchen ezetimibe (ZETIA) 10 MG tablet Take 10 mg by mouth daily.    . fluticasone (FLONASE) 50 MCG/ACT nasal spray Place 2 sprays into the nose daily as needed.    . metoprolol tartrate (LOPRESSOR) 25 MG tablet Take 1 tablet (25 mg total) by mouth 2 (two) times daily. 180 tablet 2  . Multiple Vitamin (MULTIVITAMIN) tablet Take 1 tablet by mouth daily.    . NON FORMULARY at bedtime. CPAP    . pantoprazole (PROTONIX) 40 MG tablet Take 40 mg by mouth daily.     Marland Kitchen PARoxetine (PAXIL) 20 MG tablet Take 20 mg by mouth daily.    Marland Kitchen warfarin (COUMADIN) 2.5 MG tablet Take as directed by [facility] 60 tablet 3   No current facility-administered medications for this visit.       Physical Exam:   BP (!) 164/89 (BP Location: Right Arm, Patient Position: Sitting, Cuff Size: Large)   Pulse 64   Resp 16   Ht 5\' 2"  (1.575 m)   Wt 176 lb 3.2 oz (79.9 kg)   SpO2 95% Comment: ON RA  BMI 32.23 kg/m   General:  Elderly but well-appearing  Chest:   Clear to auscultation  CV:   Regular rate and rhythm  Incisions:  Completely healed  Abdomen:  Soft nontender  Extremities:  Warm and well-perfused  Diagnostic Tests:  n/a   Impression:  Patient is doing well from a cardiac standpoint approximately 1 year following  aortic valve replacement using a bioprosthetic tissue valve. She did have a syncopal event in late October. There was no documented history of arrhythmia but the patient has not been seen in follow-up by her cardiologist. She remains chronically anticoagulated using warfarin which was started last December when she experienced postoperative atrial fibrillation following her aortic valve replacement. The patient is not very active physically but she denies any symptoms of exertional shortness of breath, chest discomfort, or fatigue.  Her blood pressure is running a little bit high in our office today.  Plan:  The patient will continue to follow-up with her primary care  physician, her neurologist, and her cardiologist.  We have not recommended any changes to her current medications. I have recommended that the patient check her blood pressure every morning and keep a log for her to review with her physicians to see whether or not any further adjustment in her antihypertensive medications should be considered. All of her questions have been addressed.  The patient has been reminded regarding the importance of dental hygiene and the lifelong need for antibiotic prophylaxis for all dental cleanings and other related invasive procedures.  I spent in excess of 15 minutes during the conduct of this office consultation and >50% of this time involved direct face-to-face encounter with the patient for counseling and/or coordination of their care.    Valentina Gu. Roxy Manns, MD 09/10/2016 12:12 PM

## 2016-09-10 NOTE — Progress Notes (Signed)
Pt is coming tomorrow at 240pm to see Dr [name]

## 2016-09-10 NOTE — Patient Instructions (Signed)
Check your blood pressure daily and keep a log to review with your doctors  Continue all previous medications without any changes at this time  Endocarditis is a potentially serious infection of heart valves or inside lining of the heart.  It occurs more commonly in patients with diseased heart valves (such as patient's with aortic or mitral valve disease) and in patients who have undergone heart valve repair or replacement.  Certain surgical and dental procedures may put you at risk, such as dental cleaning, other dental procedures, or any surgery involving the respiratory, urinary, gastrointestinal tract, gallbladder or prostate gland.   To minimize your chances for develooping endocarditis, maintain good oral health and seek prompt medical attention for any infections involving the mouth, teeth, gums, skin or urinary tract.    Always notify your doctor or dentist about your underlying heart valve condition before having any invasive procedures. You will need to take antibiotics before certain procedures, including all routine dental cleanings or other dental procedures.  Your cardiologist or dentist should prescribe these antibiotics for you to be taken ahead of time.

## 2016-09-11 ENCOUNTER — Encounter: Payer: Self-pay | Admitting: Cardiovascular Disease

## 2016-09-11 ENCOUNTER — Ambulatory Visit (INDEPENDENT_AMBULATORY_CARE_PROVIDER_SITE_OTHER): Payer: PPO | Admitting: Cardiovascular Disease

## 2016-09-11 VITALS — BP 141/85 | HR 70 | Ht 62.0 in | Wt 173.8 lb

## 2016-09-11 DIAGNOSIS — I471 Supraventricular tachycardia: Secondary | ICD-10-CM | POA: Diagnosis not present

## 2016-09-11 DIAGNOSIS — I350 Nonrheumatic aortic (valve) stenosis: Secondary | ICD-10-CM | POA: Diagnosis not present

## 2016-09-11 DIAGNOSIS — R55 Syncope and collapse: Secondary | ICD-10-CM | POA: Diagnosis not present

## 2016-09-11 DIAGNOSIS — I480 Paroxysmal atrial fibrillation: Secondary | ICD-10-CM | POA: Diagnosis not present

## 2016-09-11 DIAGNOSIS — Z953 Presence of xenogenic heart valve: Secondary | ICD-10-CM

## 2016-09-11 NOTE — Patient Instructions (Addendum)
Medication Instructions:   No medication changes made  Please monitor blood pressure Goal <140 on the top  Please call for any lightheaded spells  Labwork:  No new labs needed  Testing/Procedures:  No further testing at this time   Research VASOVAGAL SYNCOPE   I recommend watching educational videos on topics of interest to you at:       [email or web address]  Enter code: HEARTCARE    Follow-Up: It was a pleasure seeing you in the office today. Please call us if you have new issues that need to be addressed before your next appt.  [phone]  Your physician wants you to follow-up in: 6 months.  You will receive a reminder letter in the mail two months in advance. If you don't receive a letter, please call our office to schedule the follow-up appointment.  If you need a refill on your cardiac medications before your next appointment, please call your pharmacy.     Vasovagal Syncope, Adult Syncope, which is commonly known as fainting or passing out, is a temporary loss of consciousness. It occurs when the blood flow to the brain is reduced. Vasovagal syncope, also called neurocardiogenic syncope, is a fainting spell that happens when blood flow to the brain is reduced because of a sudden drop in heart rate and blood pressure. Vasovagal syncope is usually harmless. However, you can get injured if you fall during a fainting spell. What are the causes? This condition is caused by a drop in heart rate and blood pressure, usually in response to a trigger. Many things and situations can trigger an episode, including:  Pain.  Fear.  The sight of blood. This may occur during medical procedures, such as when blood is being drawn from a vein.  Common activities, such as coughing, swallowing, stretching, or going to the bathroom.  Emotional stress.  Being in a confined space.  Prolonged standing, especially in a warm environment.  Lack of sleep or rest.  Not eating for a  long time.  Not drinking enough liquids.  Recent illness.  Drinking alcohol.  Taking drugs that affect blood pressure, such as marijuana, cocaine, opiates, or inhalants. What are the signs or symptoms? Before a fainting episode, you may:  Feel dizzy or light-headed.  Become pale.  Sense that you are going to faint.  Feel like the room is spinning.  Only see directly ahead (tunnel vision).  Feel sick to your stomach (nauseous).  See spots.  Slowly lose vision.  Hear ringing in your ears.  Have a headache.  Feel warm and sweaty.  Feel a sensation of pins and needles. During the fainting spell, you may twitch or make jerky movements. Fainting spells usually last no longer than a few minutes before you wake up. If you get up too quickly before your body can recover, you may faint again. How is this diagnosed? This condition is diagnosed based on your symptoms, your medical history, and a physical exam. Tests may be done to rule out other causes of fainting. Tests may include:  Blood tests.  Heart tests, such as an electrocardiogram (ECG), echocardiogram, or electrophysiology study.  A test to check your response to changes in position (tilt table test). How is this treated? Usually, treatment is not needed for this condition. Your health care provider may suggest ways to help prevent fainting episodes. These may include:  Drinking additional fluids if you are exposed to a trigger.  Sitting or lying down if you notice signs that an episode  is coming. If your fainting spells continue, your health care provider may recommend that you:  Take medicines to prevent fainting or to help reduce further episodes of fainting.  Do certain exercises.  Wear compression stockings.  Have surgery to place a pacemaker in your body (rare). Follow these instructions at home:  Learn to identify the signs that an episode is coming.  Sit or lie down at the first sign of a fainting  spell. If you sit down, put your head down between your legs. If you lie down, swing your legs up in the air to increase blood flow to the brain.  Avoid hot tubs and saunas.  Avoid standing for a long time. If you have to stand for a long time, try:  Crossing your legs.  Flexing and stretching your leg muscles.  Squatting.  Moving your legs.  Bending over.  Drink enough fluid to keep your urine clear or pale yellow.  Make changes to your diet that your health care provider recommends. You may be told to:  Avoid caffeine.  Eat more salt.  Take over-the-counter and prescription medicines only as told by your health care provider. Contact a health care provider if:  You continue to have fainting spells despite treatment.  You faint more often despite treatment.  You lose consciousness for more than a few minutes.  You faint during or after exercising or after being startled.  You have twitching or jerky movements for longer than a few seconds during a fainting spell.  You have an episode of twitching or jerky movements without fainting. Get help right away if:  A fainting spell leads to an injury or bleeding.  You have new symptoms that occur with the fainting spells, such as:  Shortness of breath.  Chest pain.  Irregular heartbeat.  You twitch or make jerky movements for more than 5 minutes.  You twitch or make jerky movements during more than one fainting spell. This information is not intended to replace advice given to you by your health care provider. Make sure you discuss any questions you have with your health care provider. Document Released: 09/17/2012 Document Revised: 03/14/2016 Document Reviewed: 07/30/2015 Elsevier Interactive Patient Education  2017 [employer].

## 2016-09-11 NOTE — Progress Notes (Signed)
Cardiology Office Note  Date:  09/11/2016   ID:  Kayla Gray, DOB Aug 24, 1936, MRN [id number]  PCP:  Marcello Fennel, MD   Chief Complaint  Patient presents with  . other    Syncope. Meds reviewed verbally with pt.    HPI:  Kayla Gray is a very pleasant 80 year old woman with history of severe aortic valve stenosis, status post AVR with bioprosthetic valve 09/16/2015 who presents for routine follow-up of her bioprosthetic aortic valve history of SVT, syncope (stumbled backwards, fell back hit head, 2013), hyperlipidemia, iron deficiency anemia, peptic ulcer disease Status post left total knee replacement 11/15/2011 Notes provided indicate a history of SVT in February 2013 while in rehabilitation requiring adenosine, diltiazem and beta blockers, admitted with SVT, elevated troponin felt secondary to demand ischemia from tachycardia. She was started on diltiazem 120 mg daily February 2015 with TIA-type symptoms, workup in the hospital, continued on aspirin and Plavix. 30 day monitor did not show significant arrhythmia She presents today for follow-up after recent episode of syncope  She reports that she feels fine, does not feel that there is a major issue Seen by Dr. Roxy Manns, no medication changes made, blood pressure was elevated at the time She reports that blood pressure has been well-controlled at home, typically [id number] systolic Denies tachycardia, or other symptoms concerning for arrhythmia  Rarely has Lightheaded spells, lasts only a split second, she is unable to provide any more details . Sometimes once a week, sometimes less than that Sometimes when standing  In October 20th, 2017 she suffered a syncopal episode while she was out having supper at [employer]. Went to bathroom. Had abdominal pain, urgency, felt that she had to have a bowel movement. She developed lightheadedness, everything went white prior to having her bowel movement. Found herself on the floor, had released her  bowels  treated in the emergency department at [facility] where brain CT scan was negative. Seen by neurology, had EEG, results unavailable, performed 11/14th/2017 Scheduled for  MRI  12/7  Denies any further episodes of lightheadedness or dizziness, syncope near syncope No exercise program  Did not participated in cardiac rehabilitation   Orthostatics done in the office today show systolic pressure between 132 and 148, no significant change in heart rate, no orthostasis  EKG on today's visit shows normal sinus rhythm with rate 70 bpm, no significant ST or T-wave changes  Other past medical history  postoperative atrial fibrillation, started on amiodarone, warfarin not had any problems with her anticoagulation  Echocardiogram after the surgery reviewed with the patient, showing well-seated aortic valve replacement  Previous echocardiogram showing Peak velocity 510 cm/s, mean gradient 54 mmHg, peak gradient 104 mmHg.  She had knee replacement surgery early 2016  Carotid ultrasound August 2012 showed no significant plaque  echocardiogram 01/ 8 2014 showed normal LV function, aortic valve velocity 315 cm/s, peak gradient 56 mm of mercury, mean gradient 19 mm of mercury, estimated aortic valve area 0.61 cm Echocardiogram in late 2014 showing velocity of 400   Stress test August 2012 showed normal EKG with exertion, normal stress echo  Lab work may 2015 showing total cholesterol 200, LDL 111   PMH:   has a past medical history of Aortic stenosis; Bilateral cataracts; Bleeding nose; Breast cancer ([place]) (1999); Chronic diastolic (congestive) heart failure; Depression; Diverticulosis; Gastroesophageal reflux disease; Heart murmur; Hyperlipidemia; Iron deficiency anemia; Obstructive sleep apnea; Osteoarthritis; Osteopenia; Peptic ulcer disease; S/P aortic valve replacement with bioprosthetic valve (09/16/2015); Stroke [facility]); Supraventricular tachycardia ([place]);  Syncope and collapse; and  Urinary tract infection.  PSH:    Past Surgical History:  Procedure Laterality Date  . AORTIC VALVE REPLACEMENT N/A 09/16/2015   Procedure: AORTIC VALVE REPLACEMENT (AVR);  Surgeon: Rexene Alberts, MD;  Location: [facility];  Service: Open Heart Surgery;  Laterality: N/A;  . BREAST BIOPSY Right 1999   breast ca radation  . BREAST EXCISIONAL BIOPSY Left yrs ago    benign  . CARDIAC CATHETERIZATION N/A 08/17/2015   Procedure: Left Heart Cath and Coronary Angiography;  Surgeon: Minna Merritts, MD;  Location: [facility];  Service: Cardiovascular;  Laterality: N/A;  . CATARACT EXTRACTION, BILATERAL    . CHOLECYSTECTOMY    . KNEE ARTHROSCOPY     right  . KNEE ARTHROSCOPY     left  . TEE WITHOUT CARDIOVERSION N/A 09/16/2015   Procedure: TRANSESOPHAGEAL ECHOCARDIOGRAM (TEE);  Surgeon: Rexene Alberts, MD;  Location: [facility];  Service: Open Heart Surgery;  Laterality: N/A;  . TONSILLECTOMY    . VAGINAL HYSTERECTOMY      Current Outpatient Prescriptions  Medication Sig Dispense Refill  . acetaminophen (TYLENOL) 325 MG tablet Take 650 mg by mouth every 6 (six) hours as needed.    . Calcium Carbonate-Vitamin D (CALCIUM 600 + D PO) Take by mouth 2 (two) times daily.    Marland Kitchen ezetimibe (ZETIA) 10 MG tablet Take 10 mg by mouth daily.    . fluticasone (FLONASE) 50 MCG/ACT nasal spray Place 2 sprays into the nose daily as needed.    . metoprolol tartrate (LOPRESSOR) 25 MG tablet Take 1 tablet (25 mg total) by mouth 2 (two) times daily. 180 tablet 2  . Multiple Vitamin (MULTIVITAMIN) tablet Take 1 tablet by mouth daily.    . NON FORMULARY at bedtime. CPAP    . pantoprazole (PROTONIX) 40 MG tablet Take 40 mg by mouth daily.     Marland Kitchen PARoxetine (PAXIL) 20 MG tablet Take 20 mg by mouth daily.    Marland Kitchen warfarin (COUMADIN) 2.5 MG tablet Take as directed by [facility] 60 tablet 3   No current facility-administered medications for this visit.      Allergies:   Codeine; Evista [raloxifene]; Statins; and  Sulfa antibiotics   Social History:  The patient  reports that she has never smoked. She has never used smokeless tobacco. She reports that she drinks alcohol. She reports that she does not use drugs.   Family History:   family history includes Heart failure in her mother.    Review of Systems: Review of Systems  Constitutional: Negative.   Respiratory: Negative.   Cardiovascular: Negative.   Gastrointestinal: Negative.   Musculoskeletal: Negative.   Neurological: Positive for loss of consciousness.  Psychiatric/Behavioral: Negative.   All other systems reviewed and are negative.    PHYSICAL EXAM: VS:  BP (!) 141/85 (BP Location: Left Arm, Patient Position: Sitting, Cuff Size: Large)   Pulse 70   Ht 5\' 2"  (1.575 m)   Wt 173 lb 12 oz (78.8 kg)   BMI 31.78 kg/m  , BMI Body mass index is 31.78 kg/m. GEN: Well nourished, well developed, in no acute distress  HEENT: normal  Neck: no JVD, carotid bruits, or masses Cardiac: RRR; no murmurs, rubs, or gallops,no edema  Respiratory:  clear to auscultation bilaterally, normal work of breathing GI: soft, nontender, nondistended, + BS MS: no deformity or atrophy  Skin: warm and dry, no rash Neuro:  Strength and sensation are intact Psych: euthymic mood, full  affect    Recent Labs: 09/17/2015: Magnesium 2.5 08/03/2016: BUN 20; Creatinine, Ser 1.06; Hemoglobin 13.3; Platelets 144; Potassium 4.0; Sodium 138    Lipid Panel Lab Results  Component Value Date   CHOL 202 (H) 11/17/2013   HDL 41 11/17/2013   LDLCALC 129 (H) 11/17/2013   TRIG 159 11/17/2013      Wt Readings from Last 3 Encounters:  09/11/16 173 lb 12 oz (78.8 kg)  09/10/16 176 lb 3.2 oz (79.9 kg)  08/03/16 170 lb (77.1 kg)       ASSESSMENT AND PLAN:  SVT (supraventricular tachycardia) ([place]) - Plan: EKG 12-Lead Prior history of SVT. She denies any recent episodes  Paroxysmal atrial fibrillation ([place]) - Plan: EKG 12-Lead No recent documentation of atrial  fibrillation apart from postoperatively In light of recent syncope, we'll need to monitor her closely  Syncope, unspecified syncope type - Plan: EKG 12-Lead Long discussion with her concerning recent episode of syncope By her details, symptoms concerning for vasovagal event in the setting of abdominal pain, bowel urgency She had syncope with defecation following the loss of consciousness No further episodes since that time Prior episode of syncope 2013, details unclear She prefers to complete her neurologic workup first before perhaps wearing a event monitor. She is reluctant to wear the event monitor, does not feel at she is having any arrhythmia as she does not feel any palpitations or tachycardia Potential for atrial fibrillation and possible conversion pauses discussed with her  Severe aortic valve stenosis  S/P aortic valve replacement with bioprosthetic valve  recently seen by Dr. Roxy Manns, well-functioning bioprosthetic aortic valve last echocardiogram December 2016   Total encounter time more than 25 minutes  Greater than 50% was spent in counseling and coordination of care with the patient  Disposition:   F/U  6 months   Orders Placed This Encounter  Procedures  . EKG 12-Lead     Signed, Esmond Plants, M.D., Ph.D. 09/11/2016  [place], [place]

## 2016-09-20 ENCOUNTER — Ambulatory Visit
Admission: RE | Admit: 2016-09-20 | Discharge: 2016-09-20 | Disposition: A | Discharge status: Home / Self Care | Payer: PPO | Source: Ambulatory Visit | Attending: Neurology | Admitting: Neurology

## 2016-09-20 DIAGNOSIS — I639 Cerebral infarction, unspecified: Secondary | ICD-10-CM

## 2016-09-20 DIAGNOSIS — R55 Syncope and collapse: Secondary | ICD-10-CM

## 2016-09-21 ENCOUNTER — Other Ambulatory Visit: Payer: PPO

## 2016-10-03 ENCOUNTER — Ambulatory Visit (INDEPENDENT_AMBULATORY_CARE_PROVIDER_SITE_OTHER): Payer: PPO

## 2016-10-03 DIAGNOSIS — I6310 Cerebral infarction due to embolism of unspecified precerebral artery: Secondary | ICD-10-CM

## 2016-10-03 DIAGNOSIS — Z953 Presence of xenogenic heart valve: Secondary | ICD-10-CM

## 2016-10-03 DIAGNOSIS — I350 Nonrheumatic aortic (valve) stenosis: Secondary | ICD-10-CM

## 2016-10-03 DIAGNOSIS — Z5181 Encounter for therapeutic drug level monitoring: Secondary | ICD-10-CM | POA: Diagnosis not present

## 2016-10-03 LAB — POCT INR: INR: 2

## 2016-10-10 ENCOUNTER — Ambulatory Visit
Admission: RE | Admit: 2016-10-10 | Discharge: 2016-10-10 | Disposition: A | Discharge status: Home / Self Care | Payer: PPO | Source: Ambulatory Visit | Attending: Neurology | Admitting: Neurology

## 2016-10-10 DIAGNOSIS — R55 Syncope and collapse: Secondary | ICD-10-CM | POA: Insufficient documentation

## 2016-10-10 DIAGNOSIS — R42 Dizziness and giddiness: Secondary | ICD-10-CM | POA: Diagnosis not present

## 2016-10-10 DIAGNOSIS — Z8673 Personal history of transient ischemic attack (TIA), and cerebral infarction without residual deficits: Secondary | ICD-10-CM | POA: Insufficient documentation

## 2016-10-18 DIAGNOSIS — G4733 Obstructive sleep apnea (adult) (pediatric): Secondary | ICD-10-CM | POA: Diagnosis not present

## 2016-10-18 DIAGNOSIS — I471 Supraventricular tachycardia: Secondary | ICD-10-CM | POA: Diagnosis not present

## 2016-10-18 DIAGNOSIS — R269 Unspecified abnormalities of gait and mobility: Secondary | ICD-10-CM | POA: Diagnosis not present

## 2016-10-22 DIAGNOSIS — Z8673 Personal history of transient ischemic attack (TIA), and cerebral infarction without residual deficits: Secondary | ICD-10-CM | POA: Diagnosis not present

## 2016-10-22 DIAGNOSIS — R55 Syncope and collapse: Secondary | ICD-10-CM | POA: Diagnosis not present

## 2016-10-22 DIAGNOSIS — Z8659 Personal history of other mental and behavioral disorders: Secondary | ICD-10-CM | POA: Diagnosis not present

## 2016-10-22 DIAGNOSIS — G4733 Obstructive sleep apnea (adult) (pediatric): Secondary | ICD-10-CM | POA: Diagnosis not present

## 2016-10-22 DIAGNOSIS — I482 Chronic atrial fibrillation: Secondary | ICD-10-CM | POA: Diagnosis not present

## 2016-10-22 DIAGNOSIS — R2689 Other abnormalities of gait and mobility: Secondary | ICD-10-CM | POA: Diagnosis not present

## 2016-11-02 DIAGNOSIS — Z79899 Other long term (current) drug therapy: Secondary | ICD-10-CM | POA: Diagnosis not present

## 2016-11-02 DIAGNOSIS — E7800 Pure hypercholesterolemia, unspecified: Secondary | ICD-10-CM | POA: Diagnosis not present

## 2016-11-07 ENCOUNTER — Ambulatory Visit (INDEPENDENT_AMBULATORY_CARE_PROVIDER_SITE_OTHER): Payer: PPO

## 2016-11-07 DIAGNOSIS — Z953 Presence of xenogenic heart valve: Secondary | ICD-10-CM | POA: Diagnosis not present

## 2016-11-07 DIAGNOSIS — I6310 Cerebral infarction due to embolism of unspecified precerebral artery: Secondary | ICD-10-CM

## 2016-11-07 DIAGNOSIS — I350 Nonrheumatic aortic (valve) stenosis: Secondary | ICD-10-CM

## 2016-11-07 DIAGNOSIS — Z5181 Encounter for therapeutic drug level monitoring: Secondary | ICD-10-CM

## 2016-11-07 LAB — POCT INR: INR: 2.8

## 2016-11-08 DIAGNOSIS — Z0000 Encounter for general adult medical examination without abnormal findings: Secondary | ICD-10-CM | POA: Diagnosis not present

## 2016-12-05 ENCOUNTER — Ambulatory Visit (INDEPENDENT_AMBULATORY_CARE_PROVIDER_SITE_OTHER): Payer: PPO

## 2016-12-05 DIAGNOSIS — Z953 Presence of xenogenic heart valve: Secondary | ICD-10-CM

## 2016-12-05 DIAGNOSIS — I350 Nonrheumatic aortic (valve) stenosis: Secondary | ICD-10-CM

## 2016-12-05 DIAGNOSIS — Z5181 Encounter for therapeutic drug level monitoring: Secondary | ICD-10-CM

## 2016-12-05 DIAGNOSIS — I6310 Cerebral infarction due to embolism of unspecified precerebral artery: Secondary | ICD-10-CM

## 2016-12-05 LAB — POCT INR: INR: 2.6

## 2016-12-16 ENCOUNTER — Other Ambulatory Visit: Payer: Self-pay | Admitting: Cardiovascular Disease

## 2016-12-25 DIAGNOSIS — D485 Neoplasm of uncertain behavior of skin: Secondary | ICD-10-CM | POA: Diagnosis not present

## 2016-12-25 DIAGNOSIS — L820 Inflamed seborrheic keratosis: Secondary | ICD-10-CM | POA: Diagnosis not present

## 2016-12-25 DIAGNOSIS — L570 Actinic keratosis: Secondary | ICD-10-CM | POA: Diagnosis not present

## 2016-12-25 DIAGNOSIS — Z08 Encounter for follow-up examination after completed treatment for malignant neoplasm: Secondary | ICD-10-CM | POA: Diagnosis not present

## 2016-12-25 DIAGNOSIS — D0439 Carcinoma in situ of skin of other parts of face: Secondary | ICD-10-CM | POA: Diagnosis not present

## 2016-12-25 DIAGNOSIS — X32XXXA Exposure to sunlight, initial encounter: Secondary | ICD-10-CM | POA: Diagnosis not present

## 2016-12-25 DIAGNOSIS — Z85828 Personal history of other malignant neoplasm of skin: Secondary | ICD-10-CM | POA: Diagnosis not present

## 2017-01-04 ENCOUNTER — Other Ambulatory Visit: Payer: Self-pay | Admitting: *Deleted

## 2017-01-04 MED ORDER — WARFARIN SODIUM 2.5 MG PO TABS: ORAL_TABLET | ORAL | 3 refills | Status: DC

## 2017-01-04 NOTE — Telephone Encounter (Signed)
Pharmacy requests ninety day. 

## 2017-01-09 ENCOUNTER — Ambulatory Visit (INDEPENDENT_AMBULATORY_CARE_PROVIDER_SITE_OTHER): Payer: PPO

## 2017-01-09 DIAGNOSIS — Z953 Presence of xenogenic heart valve: Secondary | ICD-10-CM

## 2017-01-09 DIAGNOSIS — I6310 Cerebral infarction due to embolism of unspecified precerebral artery: Secondary | ICD-10-CM | POA: Diagnosis not present

## 2017-01-09 DIAGNOSIS — I350 Nonrheumatic aortic (valve) stenosis: Secondary | ICD-10-CM

## 2017-01-09 DIAGNOSIS — Z5181 Encounter for therapeutic drug level monitoring: Secondary | ICD-10-CM

## 2017-01-09 LAB — POCT INR: INR: 2.2

## 2017-02-05 DIAGNOSIS — D0439 Carcinoma in situ of skin of other parts of face: Secondary | ICD-10-CM | POA: Diagnosis not present

## 2017-02-20 ENCOUNTER — Ambulatory Visit (INDEPENDENT_AMBULATORY_CARE_PROVIDER_SITE_OTHER): Payer: PPO

## 2017-02-20 DIAGNOSIS — Z5181 Encounter for therapeutic drug level monitoring: Secondary | ICD-10-CM

## 2017-02-20 DIAGNOSIS — I350 Nonrheumatic aortic (valve) stenosis: Secondary | ICD-10-CM | POA: Diagnosis not present

## 2017-02-20 DIAGNOSIS — Z953 Presence of xenogenic heart valve: Secondary | ICD-10-CM | POA: Diagnosis not present

## 2017-02-20 DIAGNOSIS — I6310 Cerebral infarction due to embolism of unspecified precerebral artery: Secondary | ICD-10-CM

## 2017-02-20 LAB — POCT INR: INR: 2.7

## 2017-03-10 ENCOUNTER — Emergency Department: Payer: PPO

## 2017-03-10 ENCOUNTER — Emergency Department
Admission: EM | Admit: 2017-03-10 | Discharge: 2017-03-10 | Disposition: A | Discharge status: Home / Self Care | Payer: PPO | Attending: Emergency Medicine | Admitting: Emergency Medicine

## 2017-03-10 ENCOUNTER — Encounter: Payer: Self-pay | Admitting: Emergency Medicine

## 2017-03-10 DIAGNOSIS — Z96653 Presence of artificial knee joint, bilateral: Secondary | ICD-10-CM | POA: Insufficient documentation

## 2017-03-10 DIAGNOSIS — Z7901 Long term (current) use of anticoagulants: Secondary | ICD-10-CM | POA: Insufficient documentation

## 2017-03-10 DIAGNOSIS — K5732 Diverticulitis of large intestine without perforation or abscess without bleeding: Secondary | ICD-10-CM | POA: Diagnosis not present

## 2017-03-10 DIAGNOSIS — Z79899 Other long term (current) drug therapy: Secondary | ICD-10-CM | POA: Diagnosis not present

## 2017-03-10 DIAGNOSIS — I5032 Chronic diastolic (congestive) heart failure: Secondary | ICD-10-CM | POA: Insufficient documentation

## 2017-03-10 DIAGNOSIS — K5792 Diverticulitis of intestine, part unspecified, without perforation or abscess without bleeding: Secondary | ICD-10-CM | POA: Diagnosis not present

## 2017-03-10 DIAGNOSIS — Z8673 Personal history of transient ischemic attack (TIA), and cerebral infarction without residual deficits: Secondary | ICD-10-CM | POA: Diagnosis not present

## 2017-03-10 DIAGNOSIS — Z853 Personal history of malignant neoplasm of breast: Secondary | ICD-10-CM | POA: Diagnosis not present

## 2017-03-10 DIAGNOSIS — Z952 Presence of prosthetic heart valve: Secondary | ICD-10-CM | POA: Insufficient documentation

## 2017-03-10 DIAGNOSIS — K5730 Diverticulosis of large intestine without perforation or abscess without bleeding: Secondary | ICD-10-CM | POA: Diagnosis not present

## 2017-03-10 DIAGNOSIS — R1032 Left lower quadrant pain: Secondary | ICD-10-CM | POA: Diagnosis not present

## 2017-03-10 HISTORY — DX: Essential (primary) hypertension: I10

## 2017-03-10 LAB — CBC WITH DIFFERENTIAL/PLATELET
BASOS PCT: 1 %
Basophils Absolute: 0.1 10*3/uL (ref 0–0.1)
Eosinophils Absolute: 0.1 10*3/uL (ref 0–0.7)
Eosinophils Relative: 1 %
HEMATOCRIT: 38.7 % (ref 35.0–47.0)
HEMOGLOBIN: 12.9 g/dL (ref 12.0–16.0)
Lymphocytes Relative: 17 %
Lymphs Abs: 1.4 10*3/uL (ref 1.0–3.6)
MCH: 28.5 pg (ref 26.0–34.0)
MCHC: 33.3 g/dL (ref 32.0–36.0)
MCV: 85.5 fL (ref 80.0–100.0)
MONOS PCT: 8 %
Monocytes Absolute: 0.7 10*3/uL (ref 0.2–0.9)
NEUTROS ABS: 6.3 10*3/uL (ref 1.4–6.5)
NEUTROS PCT: 73 %
Platelets: 174 10*3/uL (ref 150–440)
RBC: 4.53 MIL/uL (ref 3.80–5.20)
RDW: 13.9 % (ref 11.5–14.5)
WBC: 8.7 10*3/uL (ref 3.6–11.0)

## 2017-03-10 LAB — COMPREHENSIVE METABOLIC PANEL
ALBUMIN: 3.9 g/dL (ref 3.5–5.0)
ALK PHOS: 65 U/L (ref 38–126)
ALT: 18 U/L (ref 14–54)
ANION GAP: 7 (ref 5–15)
AST: 32 U/L (ref 15–41)
BILIRUBIN TOTAL: 0.8 mg/dL (ref 0.3–1.2)
BUN: 12 mg/dL (ref 6–20)
CALCIUM: 9 mg/dL (ref 8.9–10.3)
CO2: 28 mmol/L (ref 22–32)
CREATININE: 1.09 mg/dL — AB (ref 0.44–1.00)
Chloride: 104 mmol/L (ref 101–111)
GFR calc Af Amer: 55 mL/min — ABNORMAL LOW (ref >60)
GFR calc non Af Amer: 47 mL/min — ABNORMAL LOW (ref >60)
GLUCOSE: 108 mg/dL — AB (ref 65–99)
Potassium: 4.2 mmol/L (ref 3.5–5.1)
Sodium: 139 mmol/L (ref 135–145)
TOTAL PROTEIN: 7.9 g/dL (ref 6.5–8.1)

## 2017-03-10 LAB — LIPASE, BLOOD: Lipase: 21 U/L (ref 11–51)

## 2017-03-10 MED ORDER — IOPAMIDOL (ISOVUE-300) INJECTION 61%
30.0000 mL | Freq: Once | INTRAVENOUS | Status: AC | PRN
  Administered 2017-03-10: 30 mL via ORAL

## 2017-03-10 MED ORDER — IOPAMIDOL (ISOVUE-300) INJECTION 61%: 100.0000 mL | Freq: Once | INTRAVENOUS | Status: DC | PRN

## 2017-03-10 MED ORDER — IOPAMIDOL (ISOVUE-300) INJECTION 61%
75.0000 mL | Freq: Once | INTRAVENOUS | Status: AC | PRN
  Administered 2017-03-10: 75 mL via INTRAVENOUS

## 2017-03-10 MED ORDER — METRONIDAZOLE 500 MG PO TABS: 500.0000 mg | ORAL_TABLET | Freq: Three times a day (TID) | ORAL | 0 refills | Status: AC

## 2017-03-10 MED ORDER — CIPROFLOXACIN HCL 500 MG PO TABS: 500.0000 mg | ORAL_TABLET | Freq: Two times a day (BID) | ORAL | 0 refills | Status: AC

## 2017-03-10 NOTE — Discharge Instructions (Signed)
Please seek medical attention for any high fevers, chest pain, shortness of breath, change in behavior, persistent vomiting, bloody stool or any other new or concerning symptoms.  

## 2017-03-10 NOTE — ED Provider Notes (Signed)
[facility] Emergency Department Provider Note   ____________________________________________   I have reviewed the triage vital signs and the nursing notes.   HISTORY  Chief Complaint Abdominal Pain   History limited by: Not Limited   HPI Kayla Gray is a 81 y.o. female who presents to the emergency department today because of concerns for abdominal pain. It is located in the left lower quadrant. It started yesterday. The symptoms wax and wane. It is sharp in nature. The patient has had loose stools however has not noticed any blood in them. The patient states that it reminds her of when she had diverticulitis roughly 15 weeks ago. Patient denies any fevers. No chest pain or shortness breath.   Past Medical History:  Diagnosis Date  . Aortic stenosis   . Bilateral cataracts   . Bleeding nose    Thurs night (09/08/15) and Fri (09/09/15) left side.Bleeding didn't last long  . Breast cancer ([place]) 1999   right breast lumpectomy and rad tx  . Chronic diastolic (congestive) heart failure ([place])   . Depression   . Diverticulosis   . Gastroesophageal reflux disease   . Heart murmur   . Hyperlipidemia   . Iron deficiency anemia   . Obstructive sleep apnea   . Osteoarthritis   . Osteopenia   . Peptic ulcer disease   . S/P aortic valve replacement with bioprosthetic valve 09/16/2015   23 mm Edwards Intuity Elite bioprosthetic tissue valve  . Stroke ([place])    11/14/13  . Supraventricular tachycardia ([place])   . Syncope and collapse   . Urinary tract infection     Patient Active Problem List   Diagnosis Date Noted  . Paroxysmal atrial fibrillation ([place]) 11/14/2015  . Encounter for therapeutic drug monitoring 09/29/2015  . S/P aortic valve replacement with bioprosthetic valve 09/16/2015  . Chronic diastolic (congestive) heart failure ([place])   . SOB (shortness of breath) 08/17/2015  . Angina pectoris ([place]) 08/17/2015  . Severe aortic valve stenosis  08/12/2015  . Low back pain 08/12/2015  . Sinus tachycardia 01/06/2014  . Osteoarthritis of both knees 01/06/2014  . CVA (cerebral vascular accident) ([place]) 11/25/2013  . Aortic valve stenosis 11/27/2012  . SVT (supraventricular tachycardia) ([place]) 11/27/2012  . Syncope 11/27/2012    Past Surgical History:  Procedure Laterality Date  . AORTIC VALVE REPLACEMENT N/A 09/16/2015   Procedure: AORTIC VALVE REPLACEMENT (AVR);  Surgeon: Rexene Alberts, MD;  Location: [place];  Service: Open Heart Surgery;  Laterality: N/A;  . BREAST BIOPSY Right 1999   breast ca radation  . BREAST EXCISIONAL BIOPSY Left yrs ago    benign  . CARDIAC CATHETERIZATION N/A 08/17/2015   Procedure: Left Heart Cath and Coronary Angiography;  Surgeon: Minna Merritts, MD;  Location: [place] [facility];  Service: Cardiovascular;  Laterality: N/A;  . CATARACT EXTRACTION, BILATERAL    . CHOLECYSTECTOMY    . JOINT REPLACEMENT Bilateral    knee replacement  . KNEE ARTHROSCOPY     right  . KNEE ARTHROSCOPY     left  . TEE WITHOUT CARDIOVERSION N/A 09/16/2015   Procedure: TRANSESOPHAGEAL ECHOCARDIOGRAM (TEE);  Surgeon: Rexene Alberts, MD;  Location: [place];  Service: Open Heart Surgery;  Laterality: N/A;  . TONSILLECTOMY    . VAGINAL HYSTERECTOMY      Prior to Admission medications   Medication Sig Start Date End Date Taking? Authorizing Provider  acetaminophen (TYLENOL) 325 MG tablet Take 650 mg by mouth every 6 (six)  hours as needed.    [provider]  Calcium Carbonate-Vitamin D (CALCIUM 600 + D PO) Take by mouth 2 (two) times daily.    [provider]  ezetimibe (ZETIA) 10 MG tablet Take 10 mg by mouth daily.    [provider]  fluticasone (FLONASE) 50 MCG/ACT nasal spray Place 2 sprays into the nose daily as needed.    [provider]  metoprolol tartrate (LOPRESSOR) 25 MG tablet TAKE 1 TABLET (25 MG TOTAL) BY MOUTH 2 (TWO) TIMES DAILY. 12/17/16   Minna Merritts, MD  Multiple  Vitamin (MULTIVITAMIN) tablet Take 1 tablet by mouth daily.    [provider]  NON FORMULARY at bedtime. CPAP    [provider]  pantoprazole (PROTONIX) 40 MG tablet Take 40 mg by mouth daily.  07/04/14   [provider]  PARoxetine (PAXIL) 20 MG tablet Take 20 mg by mouth daily.    [provider]  warfarin (COUMADIN) 2.5 MG tablet Take as directed by Coumadin Clinic 01/04/17   Minna Merritts, MD    Allergies Codeine; Evista [raloxifene]; Statins; and Sulfa antibiotics  Family History  Problem Relation Age of Onset  . Heart failure Mother     Social History Social History  Substance Use Topics  . Smoking status: Never Smoker  . Smokeless tobacco: Never Used  . Alcohol use Yes     Comment: wine    Review of Systems Constitutional: No fever/chills Eyes: No visual changes. ENT: No sore throat. Cardiovascular: Denies chest pain. Respiratory: Denies shortness of breath. Gastrointestinal: Positive for abdominal pain. Loose stools.  Genitourinary: Negative for dysuria. Musculoskeletal: Negative for back pain. Skin: Negative for rash. Neurological: Negative for headaches, focal weakness or numbness.  ____________________________________________   PHYSICAL EXAM:  VITAL SIGNS: ED Triage Vitals  Enc Vitals Group     BP 03/10/17 1042 (!) 121/59     Pulse Rate 03/10/17 1042 76     Resp 03/10/17 1042 16     Temp 03/10/17 1042 99.2 F (37.3 C)     Temp Source 03/10/17 1042 Oral     SpO2 03/10/17 1042 96 %     Weight 03/10/17 1040 165 lb (74.8 kg)     Height 03/10/17 1040 5\' 2"  (1.575 m)     Head Circumference --      Peak Flow --      Pain Score 03/10/17 1040 7   Constitutional: Alert and oriented. Well appearing and in no distress. Eyes: Conjunctivae are normal.  ENT   Head: Normocephalic and atraumatic.   Nose: No congestion/rhinnorhea.   Mouth/Throat: Mucous membranes are moist.   Neck: No  stridor. Hematological/Lymphatic/Immunilogical: No cervical lymphadenopathy. Cardiovascular: Normal rate, regular rhythm.  Systolic II/VI murmur.  Respiratory: Normal respiratory effort without tachypnea nor retractions. Breath sounds are clear and equal bilaterally. No wheezes/rales/rhonchi. Gastrointestinal: Soft and tender to palpation on the left side. No rebound. No guarding.  Genitourinary: Deferred Musculoskeletal: Normal range of motion in all extremities. No lower extremity edema. Neurologic:  Normal speech and language. No gross focal neurologic deficits are appreciated.  Skin:  Skin is warm, dry and intact. No rash noted. Psychiatric: Mood and affect are normal. Speech and behavior are normal. Patient exhibits appropriate insight and judgment.  ____________________________________________    LABS (pertinent positives/negatives)  Labs Reviewed  COMPREHENSIVE METABOLIC PANEL - Abnormal; Notable for the following:       Result Value   Glucose, Bld 108 (*)    Creatinine, Ser  1.09 (*)    GFR calc non Af Amer 47 (*)    GFR calc Af Amer 55 (*)    All other components within normal limits  CBC WITH DIFFERENTIAL/PLATELET  LIPASE, BLOOD  URINALYSIS, COMPLETE (UACMP) WITH MICROSCOPIC     ____________________________________________   EKG  None  ____________________________________________    RADIOLOGY  CT abd/pel IMPRESSION:  1. Mild acute proximal sigmoid diverticulitis. No focal fluid  collection to suggest an abscess.  2. Aortic Atherosclerosis (ICD10-170.0)     ____________________________________________   PROCEDURES  Procedures  ____________________________________________   INITIAL IMPRESSION / ASSESSMENT AND PLAN / ED COURSE  Pertinent labs & imaging results that were available during my care of the patient were reviewed by me and considered in my medical decision making (see chart for details).  Patient presented to the emergency department  today because of concerns for left-sided pain and possible diverticulitis. CT was done and does show uncomplicated diverticulitis. Patient will be given antibiotics. Discussed return precautions.  ____________________________________________   FINAL CLINICAL IMPRESSION(S) / ED DIAGNOSES  Final diagnoses:  Diverticulitis     Note: This dictation was prepared with Dragon dictation. Any transcriptional errors that result from this process are unintentional     Nance Pear, MD 03/10/17 1431

## 2017-03-10 NOTE — ED Notes (Signed)
Pt attempted to urinate but was unable

## 2017-03-10 NOTE — ED Notes (Signed)
FIRST NURSE NOTE:  Pt states she started having LLQ abd pain 1-2 days ago, has hx of diverticulitis in the past. States she may have eaten some corn to irritate it. Pt c/o diarrhea without blood.

## 2017-03-10 NOTE — ED Triage Notes (Signed)
C/O LLQ abdominal pain.  Onset of symptoms yesterday morning.  Patient states she has had diverticulitis in the past, and pain is similar.  Denies emesis, but c/o diarrhea with onset of pain.

## 2017-03-12 ENCOUNTER — Telehealth: Payer: Self-pay | Admitting: Cardiovascular Disease

## 2017-03-12 NOTE — Telephone Encounter (Signed)
Pt states she was in the ED and was placed on Cipro, and Metronidazole. She wanted to let us know.

## 2017-03-12 NOTE — Telephone Encounter (Signed)
Returned a call to the pt & she stated that she has had pain in her stomach since Friday & went to the ER on Sunday 03/10/17 & was prescribed Cipro 500mg  bid x 10 days & Flagyl 500mg  TID x 10 days & started both on 03/10/17; advised the meds interact with Couamdin & she verbalized understanding.   Pt did not take any Coumadin on yesterday (03/11/17) because she did not fell well & after discussing with [facility]-Pharmacist instructed pt to take only  1 Coumadin tablet today then report to Anticoagulation Clinic tomorrow for INR check.  Advised that her INR may be increased due the meds.  Also, pt states next Friday 03/22/17 planning Lumbar injection & does not have to have to hold Coumadin; she will report where the doctor would like her INR for procedure on next week since she does not have to hold (advised the importance because the antibiotics will interact & increase INR).

## 2017-03-13 ENCOUNTER — Ambulatory Visit (INDEPENDENT_AMBULATORY_CARE_PROVIDER_SITE_OTHER): Payer: PPO

## 2017-03-13 DIAGNOSIS — I6310 Cerebral infarction due to embolism of unspecified precerebral artery: Secondary | ICD-10-CM | POA: Diagnosis not present

## 2017-03-13 DIAGNOSIS — Z5181 Encounter for therapeutic drug level monitoring: Secondary | ICD-10-CM

## 2017-03-13 DIAGNOSIS — I350 Nonrheumatic aortic (valve) stenosis: Secondary | ICD-10-CM | POA: Diagnosis not present

## 2017-03-13 DIAGNOSIS — Z953 Presence of xenogenic heart valve: Secondary | ICD-10-CM | POA: Diagnosis not present

## 2017-03-13 LAB — POCT INR: INR: 2.8

## 2017-03-15 DIAGNOSIS — K5732 Diverticulitis of large intestine without perforation or abscess without bleeding: Secondary | ICD-10-CM | POA: Diagnosis not present

## 2017-03-20 ENCOUNTER — Ambulatory Visit (INDEPENDENT_AMBULATORY_CARE_PROVIDER_SITE_OTHER): Payer: PPO | Admitting: *Deleted

## 2017-03-20 DIAGNOSIS — I6310 Cerebral infarction due to embolism of unspecified precerebral artery: Secondary | ICD-10-CM | POA: Diagnosis not present

## 2017-03-20 DIAGNOSIS — Z953 Presence of xenogenic heart valve: Secondary | ICD-10-CM

## 2017-03-20 DIAGNOSIS — I350 Nonrheumatic aortic (valve) stenosis: Secondary | ICD-10-CM | POA: Diagnosis not present

## 2017-03-20 DIAGNOSIS — Z5181 Encounter for therapeutic drug level monitoring: Secondary | ICD-10-CM

## 2017-03-20 LAB — POCT INR: INR: 1.6

## 2017-03-22 DIAGNOSIS — M48062 Spinal stenosis, lumbar region with neurogenic claudication: Secondary | ICD-10-CM | POA: Diagnosis not present

## 2017-03-22 DIAGNOSIS — Z7901 Long term (current) use of anticoagulants: Secondary | ICD-10-CM | POA: Diagnosis not present

## 2017-03-22 DIAGNOSIS — M5416 Radiculopathy, lumbar region: Secondary | ICD-10-CM | POA: Diagnosis not present

## 2017-04-03 ENCOUNTER — Ambulatory Visit (INDEPENDENT_AMBULATORY_CARE_PROVIDER_SITE_OTHER): Payer: PPO | Admitting: *Deleted

## 2017-04-03 DIAGNOSIS — I6310 Cerebral infarction due to embolism of unspecified precerebral artery: Secondary | ICD-10-CM | POA: Diagnosis not present

## 2017-04-03 DIAGNOSIS — Z953 Presence of xenogenic heart valve: Secondary | ICD-10-CM

## 2017-04-03 DIAGNOSIS — Z5181 Encounter for therapeutic drug level monitoring: Secondary | ICD-10-CM | POA: Diagnosis not present

## 2017-04-03 DIAGNOSIS — I350 Nonrheumatic aortic (valve) stenosis: Secondary | ICD-10-CM | POA: Diagnosis not present

## 2017-04-03 LAB — POCT INR: INR: 2.6

## 2017-04-16 ENCOUNTER — Encounter: Payer: Self-pay | Admitting: Emergency Medicine

## 2017-04-16 ENCOUNTER — Emergency Department
Admission: EM | Admit: 2017-04-16 | Discharge: 2017-04-16 | Disposition: A | Discharge status: Home / Self Care | Payer: PPO | Attending: Emergency Medicine | Admitting: Emergency Medicine

## 2017-04-16 ENCOUNTER — Emergency Department: Payer: PPO

## 2017-04-16 DIAGNOSIS — Z79899 Other long term (current) drug therapy: Secondary | ICD-10-CM | POA: Insufficient documentation

## 2017-04-16 DIAGNOSIS — M5412 Radiculopathy, cervical region: Secondary | ICD-10-CM | POA: Diagnosis not present

## 2017-04-16 DIAGNOSIS — Z952 Presence of prosthetic heart valve: Secondary | ICD-10-CM | POA: Insufficient documentation

## 2017-04-16 DIAGNOSIS — I5032 Chronic diastolic (congestive) heart failure: Secondary | ICD-10-CM | POA: Insufficient documentation

## 2017-04-16 DIAGNOSIS — Z7901 Long term (current) use of anticoagulants: Secondary | ICD-10-CM | POA: Insufficient documentation

## 2017-04-16 DIAGNOSIS — I110 Hypertensive heart disease with heart failure: Secondary | ICD-10-CM | POA: Diagnosis not present

## 2017-04-16 DIAGNOSIS — Z8673 Personal history of transient ischemic attack (TIA), and cerebral infarction without residual deficits: Secondary | ICD-10-CM | POA: Insufficient documentation

## 2017-04-16 DIAGNOSIS — M79601 Pain in right arm: Secondary | ICD-10-CM | POA: Insufficient documentation

## 2017-04-16 DIAGNOSIS — M542 Cervicalgia: Secondary | ICD-10-CM | POA: Diagnosis not present

## 2017-04-16 MED ORDER — PREDNISONE 10 MG (21) PO TBPK: ORAL_TABLET | ORAL | 0 refills | Status: DC

## 2017-04-16 MED ORDER — CYCLOBENZAPRINE HCL 5 MG PO TABS: 5.0000 mg | ORAL_TABLET | Freq: Three times a day (TID) | ORAL | 0 refills | Status: DC | PRN

## 2017-04-16 MED ORDER — DEXAMETHASONE SODIUM PHOSPHATE 10 MG/ML IJ SOLN
10.0000 mg | Freq: Once | INTRAMUSCULAR | Status: AC
  Administered 2017-04-16: 10 mg via INTRAMUSCULAR
  Filled 2017-04-16: qty 1

## 2017-04-16 MED ORDER — CYCLOBENZAPRINE HCL 10 MG PO TABS
5.0000 mg | ORAL_TABLET | Freq: Once | ORAL | Status: AC
  Administered 2017-04-16: 5 mg via ORAL
  Filled 2017-04-16: qty 1

## 2017-04-16 NOTE — ED Notes (Signed)
Pt discharged to home.  Family member driving.  Discharge instructions reviewed.  Verbalized understanding.  No questions or concerns at this time.  Teach back verified.  Pt in NAD.  No items left in ED.   

## 2017-04-16 NOTE — ED Provider Notes (Signed)
[facility] Emergency Department Provider Note   ____________________________________________   I have reviewed the triage vital signs and the nursing notes.   HISTORY  Chief Complaint Arm Pain    HPI Kayla Gray is a 81 y.o. female presents with right upper extremity pain that began 2 weeks ago. Patient describes pain along the upper arm, tingling to the fingertips and muscle cramping and spasms along the right upper traps. Patient reports increase symptoms when she extends the arm out with shoulder flexion and abduction. Patient reported 2 weeks ago she had a lumbar epidural and after the procedure she noticed onset of symptoms. Patient was in a semi-side-lying position with her neck extended and rotated in her right arm fully flexed during the procedure. Patient denies any recent injury to her neck or right arm. Patient denies fever, chills, headache, vision changes, chest pain, chest tightness, shortness of breath, abdominal pain, nausea and vomiting.  Past Medical History:  Diagnosis Date  . Aortic stenosis   . Bilateral cataracts   . Bleeding nose    Thurs night (09/08/15) and Fri (09/09/15) left side.Bleeding didn't last long  . Breast cancer ([place]) 1999   right breast lumpectomy and rad tx  . Chronic diastolic (congestive) heart failure ([place])   . Depression   . Diverticulosis   . Gastroesophageal reflux disease   . Heart murmur   . Hyperlipidemia   . Hypertension   . Iron deficiency anemia   . Obstructive sleep apnea   . Osteoarthritis   . Osteopenia   . Peptic ulcer disease   . S/P aortic valve replacement with bioprosthetic valve 09/16/2015   23 mm Edwards Intuity Elite bioprosthetic tissue valve  . Stroke ([place])    11/14/13  . Supraventricular tachycardia ([place])   . Syncope and collapse   . Urinary tract infection     Patient Active Problem List   Diagnosis Date Noted  . Paroxysmal atrial fibrillation ([place]) 11/14/2015  .  Encounter for therapeutic drug monitoring 09/29/2015  . S/P aortic valve replacement with bioprosthetic valve 09/16/2015  . Chronic diastolic (congestive) heart failure ([place])   . SOB (shortness of breath) 08/17/2015  . Angina pectoris ([place]) 08/17/2015  . Severe aortic valve stenosis 08/12/2015  . Low back pain 08/12/2015  . Sinus tachycardia 01/06/2014  . Osteoarthritis of both knees 01/06/2014  . CVA (cerebral vascular accident) ([place]) 11/25/2013  . Aortic valve stenosis 11/27/2012  . SVT (supraventricular tachycardia) ([place]) 11/27/2012  . Syncope 11/27/2012    Past Surgical History:  Procedure Laterality Date  . AORTIC VALVE REPLACEMENT N/A 09/16/2015   Procedure: AORTIC VALVE REPLACEMENT (AVR);  Surgeon: Rexene Alberts, MD;  Location: [place];  Service: Open Heart Surgery;  Laterality: N/A;  . BREAST BIOPSY Right 1999   breast ca radation  . BREAST EXCISIONAL BIOPSY Left yrs ago    benign  . CARDIAC CATHETERIZATION N/A 08/17/2015   Procedure: Left Heart Cath and Coronary Angiography;  Surgeon: Minna Merritts, MD;  Location: [place] [facility];  Service: Cardiovascular;  Laterality: N/A;  . CATARACT EXTRACTION, BILATERAL    . CHOLECYSTECTOMY    . JOINT REPLACEMENT Bilateral    knee replacement  . KNEE ARTHROSCOPY     right  . KNEE ARTHROSCOPY     left  . TEE WITHOUT CARDIOVERSION N/A 09/16/2015   Procedure: TRANSESOPHAGEAL ECHOCARDIOGRAM (TEE);  Surgeon: Rexene Alberts, MD;  Location: [place];  Service: Open Heart Surgery;  Laterality: N/A;  .  TONSILLECTOMY    . VAGINAL HYSTERECTOMY      Prior to Admission medications   Medication Sig Start Date End Date Taking? Authorizing Provider  acetaminophen (TYLENOL) 325 MG tablet Take 650 mg by mouth every 6 (six) hours as needed.    [provider]  Calcium Carbonate-Vitamin D (CALCIUM 600 + D PO) Take by mouth 2 (two) times daily.    [provider]  cyclobenzaprine (FLEXERIL) 5 MG tablet Take 1 tablet (5 mg  total) by mouth 3 (three) times daily as needed. 04/16/17   Renato Spellman M, PA-C  ezetimibe (ZETIA) 10 MG tablet Take 10 mg by mouth daily.    [provider]  fluorouracil (EFUDEX) 5 % cream Apply 1 application topically 2 (two) times daily as needed. 02/05/17   [provider]  fluticasone (FLONASE) 50 MCG/ACT nasal spray Place 2 sprays into the nose daily as needed.    [provider]  metoprolol tartrate (LOPRESSOR) 25 MG tablet TAKE 1 TABLET (25 MG TOTAL) BY MOUTH 2 (TWO) TIMES DAILY. 12/17/16   Minna Merritts, MD  Multiple Vitamin (MULTIVITAMIN) tablet Take 1 tablet by mouth daily.    [provider]  pantoprazole (PROTONIX) 40 MG tablet Take 40 mg by mouth daily as needed.  07/04/14   [provider]  PARoxetine (PAXIL) 20 MG tablet Take 20 mg by mouth daily.    [provider]  predniSONE (STERAPRED UNI-PAK 21 TAB) 10 MG (21) TBPK tablet Take 6 tablets on day 1. Take 5 tablets on day 2. Take 4 tablets on day 3. Take 3 tablets on day 4. Take 2 tablets on day 5. Take 1 tablets on day 6. 04/16/17   Hosam Mcfetridge M, PA-C  warfarin (COUMADIN) 2.5 MG tablet Take as directed by Coumadin Clinic Patient taking differently: Take by mouth See admin instructions. Wed, fri, sat and sun, tk 2 tabs, on other days take 1.5 tabs 01/04/17   Minna Merritts, MD    Allergies Codeine; Evista [raloxifene]; Statins; and Sulfa antibiotics  Family History  Problem Relation Age of Onset  . Heart failure Mother     Social History Social History  Substance Use Topics  . Smoking status: Never Smoker  . Smokeless tobacco: Never Used  . Alcohol use Yes     Comment: wine    Review of Systems Constitutional: Negative for fever/chills Eyes: No visual changes. Cardiovascular: Denies chest pain. Respiratory: Denies cough Denies shortness of breath. Musculoskeletal: Right upper extremity, and right upper trap pain Skin: for rash. Neurological: Negative for  headaches.  Negative focal weakness or numbness. Negative for loss of consciousness. Able to ambulate. ____________________________________________   PHYSICAL EXAM:  VITAL SIGNS: ED Triage Vitals  Enc Vitals Group     BP 04/16/17 1542 135/64     Pulse Rate 04/16/17 1542 71     Resp 04/16/17 1542 18     Temp 04/16/17 1542 97.8 F (36.6 C)     Temp Source 04/16/17 1542 Oral     SpO2 04/16/17 1542 98 %     Weight 04/16/17 1543 165 lb (74.8 kg)     Height 04/16/17 1543 5\' 2"  (1.575 m)     Head Circumference --      Peak Flow --      Pain Score 04/16/17 1541 6     Pain Loc --      Pain Edu? --      Excl. in [place]? --  Constitutional: Alert and oriented. Well appearing and in no acute distress.  Head: Normocephalic and atraumatic. Eyes: Conjunctivae are normal. PERRL. Cardiovascular: Normal rate, regular rhythm. Normal distal pulses. Respiratory: Normal respiratory effort.  Musculoskeletal: Cervical spine range of motion in all planes intact with increased pain and radicular symptoms with cervical extension and left rotation. Right upper extremity radicular pain and tingling to the first 3 digits. Intact range of motion and strength to the right upper extremity. Positive Spurling's. Positive cervical compression. Otherwise, nontender with normal range of motion in all extremities. Neurologic: Normal speech and language.  Skin:  Skin is warm, dry and intact. No rash noted. Psychiatric: Mood and affect are normal.  ____________________________________________   LABS (all labs ordered are listed, but only abnormal results are displayed)  Labs Reviewed - No data to display ____________________________________________  EKG None ____________________________________________  RADIOLOGY DG cervical spine IMPRESSION: 1. Slight disc space narrowing at C6-7 with associated multilevel degenerative facet arthropathy and uncovertebral joint osteoarthritis. 2. Aortic  atherosclerosis. ____________________________________________   PROCEDURES  Procedure(s) performed: No    Critical Care performed: no ____________________________________________   INITIAL IMPRESSION / ASSESSMENT AND PLAN / ED COURSE  Pertinent labs & imaging results that were available during my care of the patient were reviewed by me and considered in my medical decision making (see chart for details).  Patient presents with right upper extremity pain and tingling to the first 3 digits. History, physical exam and imaging are reassuring symptoms are consistent with nerve root irritation and inflammation in the cervical spine. Patient reported slight decrease in symptoms after Decadron 10 mg IM and Flexeril 5 mg. Patient will be given a prescription for prednisone taper and Flexeril 5 mg for symptoms management. Patient is scheduled to follow-up with her provider next week for current symptoms and she plans on attending this appointment. Patient informed of clinical course, understand medical decision-making process, and agree with plan.  Patient was advised to return to the emergency department for symptoms that change or worsen.     ______________________________________   FINAL CLINICAL IMPRESSION(S) / ED DIAGNOSES  Final diagnoses:  Right arm pain  Cervical radicular pain       NEW MEDICATIONS STARTED DURING THIS VISIT:  New Prescriptions   CYCLOBENZAPRINE (FLEXERIL) 5 MG TABLET    Take 1 tablet (5 mg total) by mouth 3 (three) times daily as needed.   PREDNISONE (STERAPRED UNI-PAK 21 TAB) 10 MG (21) TBPK TABLET    Take 6 tablets on day 1. Take 5 tablets on day 2. Take 4 tablets on day 3. Take 3 tablets on day 4. Take 2 tablets on day 5. Take 1 tablets on day 6.     Note:  This document was prepared using Dragon voice recognition software and may include unintentional dictation errors.    Jerolyn Shin, PA-C 04/16/17 1749    Delman Kitten, MD 04/16/17 2329

## 2017-04-16 NOTE — ED Triage Notes (Signed)
Pt to ed with c/o right arm pain x 2 weeks,  Pt reports pain is worse with use and movement of arm.  Pt denies injury, but states pain started in right shoulder and radiates down right arm.

## 2017-04-16 NOTE — ED Notes (Signed)
See triage note  States she developed pain to right shoulder w/o injury  States pain is posterior and moves into right arm  And having some tingling in arm  No deformity noted   Good pulses states she has gotten some relief Goody's

## 2017-04-24 ENCOUNTER — Ambulatory Visit (INDEPENDENT_AMBULATORY_CARE_PROVIDER_SITE_OTHER): Payer: PPO | Admitting: *Deleted

## 2017-04-24 ENCOUNTER — Other Ambulatory Visit: Payer: Self-pay | Admitting: Family Medicine

## 2017-04-24 DIAGNOSIS — I350 Nonrheumatic aortic (valve) stenosis: Secondary | ICD-10-CM | POA: Diagnosis not present

## 2017-04-24 DIAGNOSIS — I6310 Cerebral infarction due to embolism of unspecified precerebral artery: Secondary | ICD-10-CM | POA: Diagnosis not present

## 2017-04-24 DIAGNOSIS — Z5181 Encounter for therapeutic drug level monitoring: Secondary | ICD-10-CM | POA: Diagnosis not present

## 2017-04-24 DIAGNOSIS — Z953 Presence of xenogenic heart valve: Secondary | ICD-10-CM | POA: Diagnosis not present

## 2017-04-24 DIAGNOSIS — Z1231 Encounter for screening mammogram for malignant neoplasm of breast: Secondary | ICD-10-CM

## 2017-04-24 LAB — POCT INR: INR: 3

## 2017-04-26 DIAGNOSIS — G8929 Other chronic pain: Secondary | ICD-10-CM | POA: Diagnosis not present

## 2017-04-26 DIAGNOSIS — M545 Low back pain: Secondary | ICD-10-CM | POA: Diagnosis not present

## 2017-04-26 DIAGNOSIS — M150 Primary generalized (osteo)arthritis: Secondary | ICD-10-CM | POA: Diagnosis not present

## 2017-04-26 DIAGNOSIS — M5412 Radiculopathy, cervical region: Secondary | ICD-10-CM | POA: Diagnosis not present

## 2017-04-29 ENCOUNTER — Other Ambulatory Visit: Payer: Self-pay | Admitting: Rheumatology

## 2017-04-29 DIAGNOSIS — M5412 Radiculopathy, cervical region: Secondary | ICD-10-CM

## 2017-05-01 ENCOUNTER — Ambulatory Visit
Admission: RE | Admit: 2017-05-01 | Discharge: 2017-05-01 | Disposition: A | Discharge status: Home / Self Care | Payer: PPO | Source: Ambulatory Visit | Attending: Rheumatology | Admitting: Rheumatology

## 2017-05-01 ENCOUNTER — Ambulatory Visit: Payer: PPO

## 2017-05-01 DIAGNOSIS — M4722 Other spondylosis with radiculopathy, cervical region: Secondary | ICD-10-CM | POA: Insufficient documentation

## 2017-05-01 DIAGNOSIS — M5412 Radiculopathy, cervical region: Secondary | ICD-10-CM | POA: Diagnosis present

## 2017-05-01 DIAGNOSIS — M2578 Osteophyte, vertebrae: Secondary | ICD-10-CM | POA: Diagnosis not present

## 2017-05-01 DIAGNOSIS — M542 Cervicalgia: Secondary | ICD-10-CM | POA: Diagnosis not present

## 2017-05-03 ENCOUNTER — Ambulatory Visit: Payer: PPO

## 2017-05-08 ENCOUNTER — Ambulatory Visit (INDEPENDENT_AMBULATORY_CARE_PROVIDER_SITE_OTHER): Payer: PPO

## 2017-05-08 DIAGNOSIS — I350 Nonrheumatic aortic (valve) stenosis: Secondary | ICD-10-CM

## 2017-05-08 DIAGNOSIS — I6310 Cerebral infarction due to embolism of unspecified precerebral artery: Secondary | ICD-10-CM | POA: Diagnosis not present

## 2017-05-08 DIAGNOSIS — Z953 Presence of xenogenic heart valve: Secondary | ICD-10-CM | POA: Diagnosis not present

## 2017-05-08 DIAGNOSIS — Z5181 Encounter for therapeutic drug level monitoring: Secondary | ICD-10-CM | POA: Diagnosis not present

## 2017-05-08 LAB — POCT INR: INR: 3.7

## 2017-05-09 DIAGNOSIS — M25511 Pain in right shoulder: Secondary | ICD-10-CM | POA: Diagnosis not present

## 2017-05-09 DIAGNOSIS — M25512 Pain in left shoulder: Secondary | ICD-10-CM | POA: Diagnosis not present

## 2017-05-09 DIAGNOSIS — M50323 Other cervical disc degeneration at C6-C7 level: Secondary | ICD-10-CM | POA: Diagnosis not present

## 2017-05-09 DIAGNOSIS — M5412 Radiculopathy, cervical region: Secondary | ICD-10-CM | POA: Diagnosis not present

## 2017-05-09 DIAGNOSIS — M4802 Spinal stenosis, cervical region: Secondary | ICD-10-CM | POA: Diagnosis not present

## 2017-05-15 DIAGNOSIS — M542 Cervicalgia: Secondary | ICD-10-CM | POA: Diagnosis not present

## 2017-05-15 DIAGNOSIS — M25511 Pain in right shoulder: Secondary | ICD-10-CM | POA: Diagnosis not present

## 2017-05-17 DIAGNOSIS — M5412 Radiculopathy, cervical region: Secondary | ICD-10-CM | POA: Diagnosis not present

## 2017-05-22 ENCOUNTER — Ambulatory Visit (INDEPENDENT_AMBULATORY_CARE_PROVIDER_SITE_OTHER): Payer: PPO | Admitting: *Deleted

## 2017-05-22 DIAGNOSIS — Z953 Presence of xenogenic heart valve: Secondary | ICD-10-CM

## 2017-05-22 DIAGNOSIS — I6310 Cerebral infarction due to embolism of unspecified precerebral artery: Secondary | ICD-10-CM

## 2017-05-22 DIAGNOSIS — Z5181 Encounter for therapeutic drug level monitoring: Secondary | ICD-10-CM

## 2017-05-22 DIAGNOSIS — I350 Nonrheumatic aortic (valve) stenosis: Secondary | ICD-10-CM

## 2017-05-22 LAB — POCT INR: INR: 5

## 2017-05-23 ENCOUNTER — Telehealth: Payer: Self-pay | Admitting: Cardiovascular Disease

## 2017-05-23 NOTE — Telephone Encounter (Signed)
Please call cell

## 2017-05-23 NOTE — Telephone Encounter (Signed)
Attempted to contact pt to let her know that we need cardiac clearance request from operating physician.  She had coumadin check yesterday and does not have DOS scheduled yet. I will need the # to office performing.  No answer, vm not set up on mobile #, na at home #.

## 2017-05-23 NOTE — Telephone Encounter (Signed)
Patient preparing for upcoming surgical procedure with [facility] and needs to hold coumadin for 4 days.  They have not yet scheduled this and her PT INR was elevated yesterday at her appt.    Patient needs a letter of clearance to hold coumadin  Please call patient with instructions

## 2017-05-24 ENCOUNTER — Telehealth: Payer: Self-pay | Admitting: Cardiovascular Disease

## 2017-05-24 NOTE — Telephone Encounter (Signed)
Request received from Dr. Sharlet Salina office for clearance to hold coumadin for 4 days prior to cervical epidural. Please route clearance to Fax [phone] with attention to [facility]. If any questions please call [phone]

## 2017-05-26 NOTE — Telephone Encounter (Signed)
Ok to hold warfarin for 4 days prior to procedure Restart after procedure

## 2017-05-27 NOTE — Telephone Encounter (Signed)
Note placed on appt scheduled for 05/29/17.

## 2017-05-28 NOTE — Telephone Encounter (Signed)
Routed message to fax number provided.

## 2017-05-29 ENCOUNTER — Ambulatory Visit (INDEPENDENT_AMBULATORY_CARE_PROVIDER_SITE_OTHER): Payer: PPO

## 2017-05-29 DIAGNOSIS — Z953 Presence of xenogenic heart valve: Secondary | ICD-10-CM | POA: Diagnosis not present

## 2017-05-29 DIAGNOSIS — Z5181 Encounter for therapeutic drug level monitoring: Secondary | ICD-10-CM | POA: Diagnosis not present

## 2017-05-29 DIAGNOSIS — I350 Nonrheumatic aortic (valve) stenosis: Secondary | ICD-10-CM | POA: Diagnosis not present

## 2017-05-29 DIAGNOSIS — I6310 Cerebral infarction due to embolism of unspecified precerebral artery: Secondary | ICD-10-CM

## 2017-05-29 LAB — POCT INR: INR: 1.9

## 2017-06-11 ENCOUNTER — Ambulatory Visit: Payer: PPO | Admitting: Student in an Organized Health Care Education/Training Program

## 2017-06-11 ENCOUNTER — Ambulatory Visit
Admission: RE | Admit: 2017-06-11 | Discharge: 2017-06-11 | Disposition: A | Discharge status: Home / Self Care | Payer: PPO | Source: Ambulatory Visit | Attending: Family Medicine | Admitting: Family Medicine

## 2017-06-11 DIAGNOSIS — Z1231 Encounter for screening mammogram for malignant neoplasm of breast: Secondary | ICD-10-CM | POA: Diagnosis not present

## 2017-06-19 ENCOUNTER — Ambulatory Visit (INDEPENDENT_AMBULATORY_CARE_PROVIDER_SITE_OTHER): Payer: PPO

## 2017-06-19 DIAGNOSIS — I350 Nonrheumatic aortic (valve) stenosis: Secondary | ICD-10-CM

## 2017-06-19 DIAGNOSIS — Z953 Presence of xenogenic heart valve: Secondary | ICD-10-CM

## 2017-06-19 DIAGNOSIS — Z5181 Encounter for therapeutic drug level monitoring: Secondary | ICD-10-CM | POA: Diagnosis not present

## 2017-06-19 DIAGNOSIS — I6310 Cerebral infarction due to embolism of unspecified precerebral artery: Secondary | ICD-10-CM | POA: Diagnosis not present

## 2017-06-19 LAB — POCT INR: INR: 2

## 2017-06-20 DIAGNOSIS — Z79899 Other long term (current) drug therapy: Secondary | ICD-10-CM | POA: Diagnosis not present

## 2017-06-20 DIAGNOSIS — E7800 Pure hypercholesterolemia, unspecified: Secondary | ICD-10-CM | POA: Diagnosis not present

## 2017-06-23 NOTE — Progress Notes (Signed)
Cardiology Office Note  Date:  06/25/2017   ID:  Kayla Gray, DOB 09/12/1936, MRN [id number]  PCP:  Derinda Late, MD   Chief Complaint  Patient presents with  . OTHER    12 month f/u no complaints today. Meds reviewed verbally with pt.    HPI:  Kayla Gray is a very pleasant 81 year old woman with history of  severe aortic valve stenosis, status post AVR with bioprosthetic valve 09/16/2015  postoperative atrial fibrillation, started on amiodarone, warfarin syncope (stumbled backwards, fell back hit head, 2013),  hyperlipidemia,  iron deficiency anemia, peptic ulcer disease Status post left total knee replacement 11/15/2011 SVT in February 2013 admitted   elevated troponin felt secondary to demand ischemia from tachycardia.  February 2015 with TIA-type symptoms, 30 day monitor did not show significant arrhythmia She presents today for  Syncope, SVT and bioprosthetic aortic valve  Neck disk problem, ER x 2  predisone x2 Neck and right arm pain If no improvement will get cortisone shot September 17  Otherwise feels well from a cardiac perspective No syncope Rare dizzy, sits back down. Stable, commented on this on her last clinic visit No Tia or stroke symptoms Tolerating warfarin Denies any tachycardia concerning for atrial fibrillation  Lab work reviewed with her in detail from primary care Total chol 182, LDL 98 Unable to tolerate statins, she is tolerating Zetia  EKG personally reviewed by myself on todays visit Shows normal sinus rhythm rate 69 bpm no significant ST or T-wave changes  Other past medical history reviewed In October 20th, 2017 she suffered a syncopal episode while she was out having supper at [employer]. Went to bathroom. Had abdominal pain, urgency, felt that she had to have a bowel movement. She developed lightheadedness, everything went white prior to having her bowel movement. Found herself on the floor, had released her bowels  treated in the  emergency department at [facility] where brain CT scan was negative. Seen by neurology, had EEG, results unavailable, performed 11/14th/2017 Scheduled for  MRI  12/7  Did not participated in cardiac rehabilitation    postoperative atrial fibrillation, started on amiodarone, warfarin not had any problems with her anticoagulation  Echocardiogram after the surgery reviewed with the patient, showing well-seated aortic valve replacement  Previous echocardiogram showing Peak velocity 510 cm/s, mean gradient 54 mmHg, peak gradient 104 mmHg.  She had knee replacement surgery early 2016  Carotid ultrasound August 2012 showed no significant plaque  echocardiogram 01/ 8 2014 showed normal LV function, aortic valve velocity 315 cm/s, peak gradient 56 mm of mercury, mean gradient 19 mm of mercury, estimated aortic valve area 0.61 cm Echocardiogram in late 2014 showing velocity of 400   Stress test August 2012 showed normal EKG with exertion, normal stress echo  Lab work may 2015 showing total cholesterol 200, LDL 111   PMH:   has a past medical history of Aortic stenosis; Bilateral cataracts; Bleeding nose; Breast cancer ([place]) (1999); Chronic diastolic (congestive) heart failure ([place]); Depression; Diverticulosis; Gastroesophageal reflux disease; Heart murmur; Hyperlipidemia; Hypertension; Iron deficiency anemia; Obstructive sleep apnea; Osteoarthritis; Osteopenia; Peptic ulcer disease; S/P aortic valve replacement with bioprosthetic valve (09/16/2015); Stroke [facility]); Supraventricular tachycardia ([place]); Syncope and collapse; and Urinary tract infection.  PSH:    Past Surgical History:  Procedure Laterality Date  . AORTIC VALVE REPLACEMENT N/A 09/16/2015   Procedure: AORTIC VALVE REPLACEMENT (AVR);  Surgeon: Rexene Alberts, MD;  Location: [place];  Service: Open Heart Surgery;  Laterality: N/A;  . BREAST BIOPSY  Right 1999   breast ca radation  . BREAST EXCISIONAL BIOPSY Left yrs ago    benign  .  BREAST LUMPECTOMY Right 1999   with radiation  . CARDIAC CATHETERIZATION N/A 08/17/2015   Procedure: Left Heart Cath and Coronary Angiography;  Surgeon: Minna Merritts, MD;  Location: [place] CV [facility];  Service: Cardiovascular;  Laterality: N/A;  . CATARACT EXTRACTION, BILATERAL    . CHOLECYSTECTOMY    . JOINT REPLACEMENT Bilateral    knee replacement  . KNEE ARTHROSCOPY     right  . KNEE ARTHROSCOPY     left  . TEE WITHOUT CARDIOVERSION N/A 09/16/2015   Procedure: TRANSESOPHAGEAL ECHOCARDIOGRAM (TEE);  Surgeon: Rexene Alberts, MD;  Location: [place];  Service: Open Heart Surgery;  Laterality: N/A;  . TONSILLECTOMY    . VAGINAL HYSTERECTOMY      Current Outpatient Prescriptions  Medication Sig Dispense Refill  . acetaminophen (TYLENOL) 325 MG tablet Take 650 mg by mouth every 6 (six) hours as needed.    . Calcium Carbonate-Vitamin D (CALCIUM 600 + D PO) Take by mouth 2 (two) times daily.    Marland Kitchen ezetimibe (ZETIA) 10 MG tablet Take 10 mg by mouth daily.    . fluticasone (FLONASE) 50 MCG/ACT nasal spray Place 2 sprays into the nose daily as needed.    . metoprolol tartrate (LOPRESSOR) 25 MG tablet TAKE 1 TABLET (25 MG TOTAL) BY MOUTH 2 (TWO) TIMES DAILY. 180 tablet 2  . Multiple Vitamin (MULTIVITAMIN) tablet Take 1 tablet by mouth daily.    . pantoprazole (PROTONIX) 40 MG tablet Take 40 mg by mouth daily as needed.     Marland Kitchen PARoxetine (PAXIL) 20 MG tablet Take 20 mg by mouth daily.    Marland Kitchen warfarin (COUMADIN) 2.5 MG tablet Take as directed by [facility] (Patient taking differently: Take by mouth See admin instructions. Wed, fri, sat and sun, tk 2 tabs, on other days take 1.5 tabs) 60 tablet 3   No current facility-administered medications for this visit.      Allergies:   Codeine; Evista [raloxifene]; Statins; and Sulfa antibiotics   Social History:  The patient  reports that she has never smoked. She has never used smokeless tobacco. She reports that she drinks alcohol. She reports  that she does not use drugs.   Family History:   family history includes Breast cancer in her maternal aunt; Heart failure in her mother.    Review of Systems: Review of Systems  Constitutional: Negative.   Respiratory: Negative.   Cardiovascular: Negative.   Gastrointestinal: Negative.   Musculoskeletal: Negative.   Neurological: Negative.        Rare dizziness when standing  Psychiatric/Behavioral: Negative.   All other systems reviewed and are negative.    PHYSICAL EXAM: VS:  BP (!) 142/84 (BP Location: Left Arm, Patient Position: Sitting, Cuff Size: Normal)   Pulse 69   Ht 5' 2.4" (1.585 m)   Wt 177 lb 4 oz (80.4 kg)   BMI 32.01 kg/m  , BMI Body mass index is 32.01 kg/m. GEN: Well nourished, well developed, in no acute distress  HEENT: normal  Neck: no JVD, carotid bruits, or masses Cardiac: RRR; no murmurs, rubs, or gallops,no edema  Respiratory:  clear to auscultation bilaterally, normal work of breathing GI: soft, nontender, nondistended, + BS MS: no deformity or atrophy  Skin: warm and dry, no rash Neuro:  Strength and sensation are intact Psych: euthymic mood, full affect    Recent Labs:  03/10/2017: ALT 18; BUN 12; Creatinine, Ser 1.09; Hemoglobin 12.9; Platelets 174; Potassium 4.2; Sodium 139    Lipid Panel Lab Results  Component Value Date   CHOL 202 (H) 11/17/2013   HDL 41 11/17/2013   LDLCALC 129 (H) 11/17/2013   TRIG 159 11/17/2013      Wt Readings from Last 3 Encounters:  06/25/17 177 lb 4 oz (80.4 kg)  04/16/17 165 lb (74.8 kg)  03/10/17 165 lb (74.8 kg)       ASSESSMENT AND PLAN:  SVT (supraventricular tachycardia) ([place]) - Plan: EKG 12-Lead Prior history of SVT. She denies any recent episodes No further workup needed  Paroxysmal atrial fibrillation ([place]) - Plan: EKG 12-Lead No recent documentation of atrial fibrillation apart from postoperatively Tolerating warfarin  Syncope, unspecified syncope type - Plan: EKG 12-Lead No  symptoms recently Previous symptoms concerning for vasovagal event in the setting of abdominal pain, bowel urgency She had syncope with defecation Prior episode of syncope 2013, details unclear She declined workup with event monitor at the time. Potential for atrial fibrillation and possible conversion pauses discussed with her  Severe aortic valve stenosis S/P aortic valve replacement with bioprosthetic valve  recently seen by Dr. Roxy Manns, well-functioning bioprosthetic aortic valve last echocardiogram December 2016 Repeat echocardiogram ordered for December 2018   Total encounter time more than 25 minutes  Greater than 50% was spent in counseling and coordination of care with the patient  Disposition:   F/U  12 months   Orders Placed This Encounter  Procedures  . EKG 12-Lead  . ECHOCARDIOGRAM COMPLETE     Signed, Esmond Plants, M.D., Ph.D. 06/25/2017  [place], [place]

## 2017-06-25 ENCOUNTER — Encounter: Payer: Self-pay | Admitting: Cardiovascular Disease

## 2017-06-25 ENCOUNTER — Ambulatory Visit (INDEPENDENT_AMBULATORY_CARE_PROVIDER_SITE_OTHER): Payer: PPO | Admitting: Cardiovascular Disease

## 2017-06-25 VITALS — BP 142/84 | HR 69 | Ht 62.4 in | Wt 177.2 lb

## 2017-06-25 DIAGNOSIS — I060 Rheumatic aortic stenosis: Secondary | ICD-10-CM | POA: Diagnosis not present

## 2017-06-25 DIAGNOSIS — I5032 Chronic diastolic (congestive) heart failure: Secondary | ICD-10-CM | POA: Diagnosis not present

## 2017-06-25 DIAGNOSIS — Z953 Presence of xenogenic heart valve: Secondary | ICD-10-CM

## 2017-06-25 DIAGNOSIS — R55 Syncope and collapse: Secondary | ICD-10-CM

## 2017-06-25 DIAGNOSIS — I471 Supraventricular tachycardia: Secondary | ICD-10-CM | POA: Diagnosis not present

## 2017-06-25 NOTE — Patient Instructions (Addendum)
Medication Instructions:   No medication changes made  Labwork:  No new labs needed  Testing/Procedures:  We will order echocardiogram for dec 2018, s/p AVR   Follow-Up: It was a pleasure seeing you in the office today. Please call us if you have new issues that need to be addressed before your next appt.  [phone]  Your physician wants you to follow-up in: 12 months.  You will receive a reminder letter in the mail two months in advance. If you don't receive a letter, please call our office to schedule the follow-up appointment.  If you need a refill on your cardiac medications before your next appointment, please call your pharmacy.

## 2017-06-27 DIAGNOSIS — Z9989 Dependence on other enabling machines and devices: Secondary | ICD-10-CM | POA: Diagnosis not present

## 2017-06-27 DIAGNOSIS — D2261 Melanocytic nevi of right upper limb, including shoulder: Secondary | ICD-10-CM | POA: Diagnosis not present

## 2017-06-27 DIAGNOSIS — I350 Nonrheumatic aortic (valve) stenosis: Secondary | ICD-10-CM | POA: Diagnosis not present

## 2017-06-27 DIAGNOSIS — G4733 Obstructive sleep apnea (adult) (pediatric): Secondary | ICD-10-CM | POA: Diagnosis not present

## 2017-06-27 DIAGNOSIS — L298 Other pruritus: Secondary | ICD-10-CM | POA: Diagnosis not present

## 2017-06-27 DIAGNOSIS — E7800 Pure hypercholesterolemia, unspecified: Secondary | ICD-10-CM | POA: Diagnosis not present

## 2017-06-27 DIAGNOSIS — L821 Other seborrheic keratosis: Secondary | ICD-10-CM | POA: Diagnosis not present

## 2017-06-27 DIAGNOSIS — Z85828 Personal history of other malignant neoplasm of skin: Secondary | ICD-10-CM | POA: Diagnosis not present

## 2017-06-27 DIAGNOSIS — L820 Inflamed seborrheic keratosis: Secondary | ICD-10-CM | POA: Diagnosis not present

## 2017-06-27 DIAGNOSIS — Z79899 Other long term (current) drug therapy: Secondary | ICD-10-CM | POA: Diagnosis not present

## 2017-06-27 DIAGNOSIS — L538 Other specified erythematous conditions: Secondary | ICD-10-CM | POA: Diagnosis not present

## 2017-06-27 DIAGNOSIS — D2271 Melanocytic nevi of right lower limb, including hip: Secondary | ICD-10-CM | POA: Diagnosis not present

## 2017-06-27 DIAGNOSIS — R208 Other disturbances of skin sensation: Secondary | ICD-10-CM | POA: Diagnosis not present

## 2017-06-27 DIAGNOSIS — F419 Anxiety disorder, unspecified: Secondary | ICD-10-CM | POA: Diagnosis not present

## 2017-07-01 DIAGNOSIS — M5030 Other cervical disc degeneration, unspecified cervical region: Secondary | ICD-10-CM | POA: Diagnosis not present

## 2017-07-01 DIAGNOSIS — Z7901 Long term (current) use of anticoagulants: Secondary | ICD-10-CM | POA: Diagnosis not present

## 2017-07-01 DIAGNOSIS — M5412 Radiculopathy, cervical region: Secondary | ICD-10-CM | POA: Diagnosis not present

## 2017-07-05 IMAGING — CR DG CHEST 2V
1 series · 2 of 2 positions shown · non-contrast
Comparison: PA and lateral chest x-ray October 27, 2014.

CLINICAL DATA: Preoperative study prior to heart catheterization
next week, history of right-sided breast malignancy in 4333,
nonsmoker, history of aortic stenosis and SVT.

EXAM:
CHEST  2 VIEW

[Series 1: w chest pa · 0.14mm/px · 2 of 2 slices shown]
[im 1/2]
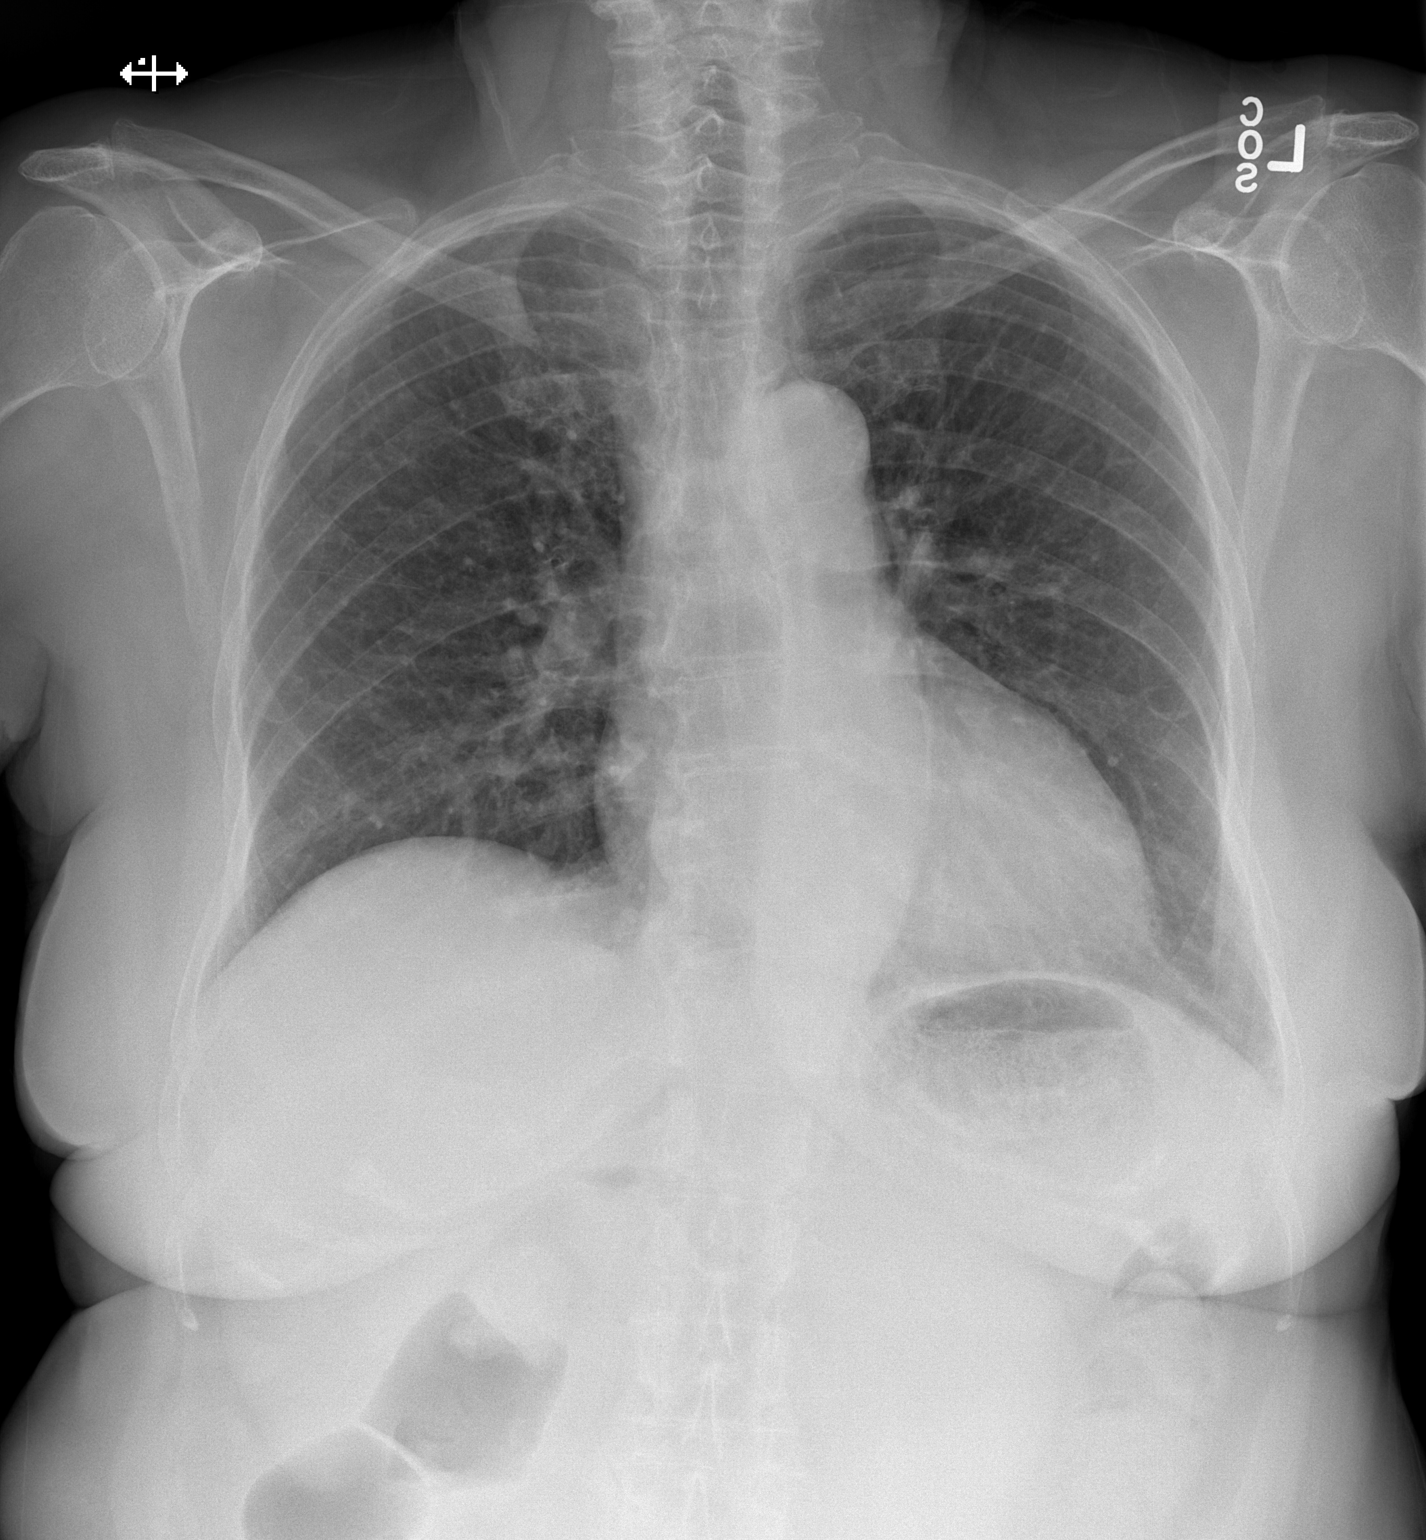
[im 2/2]
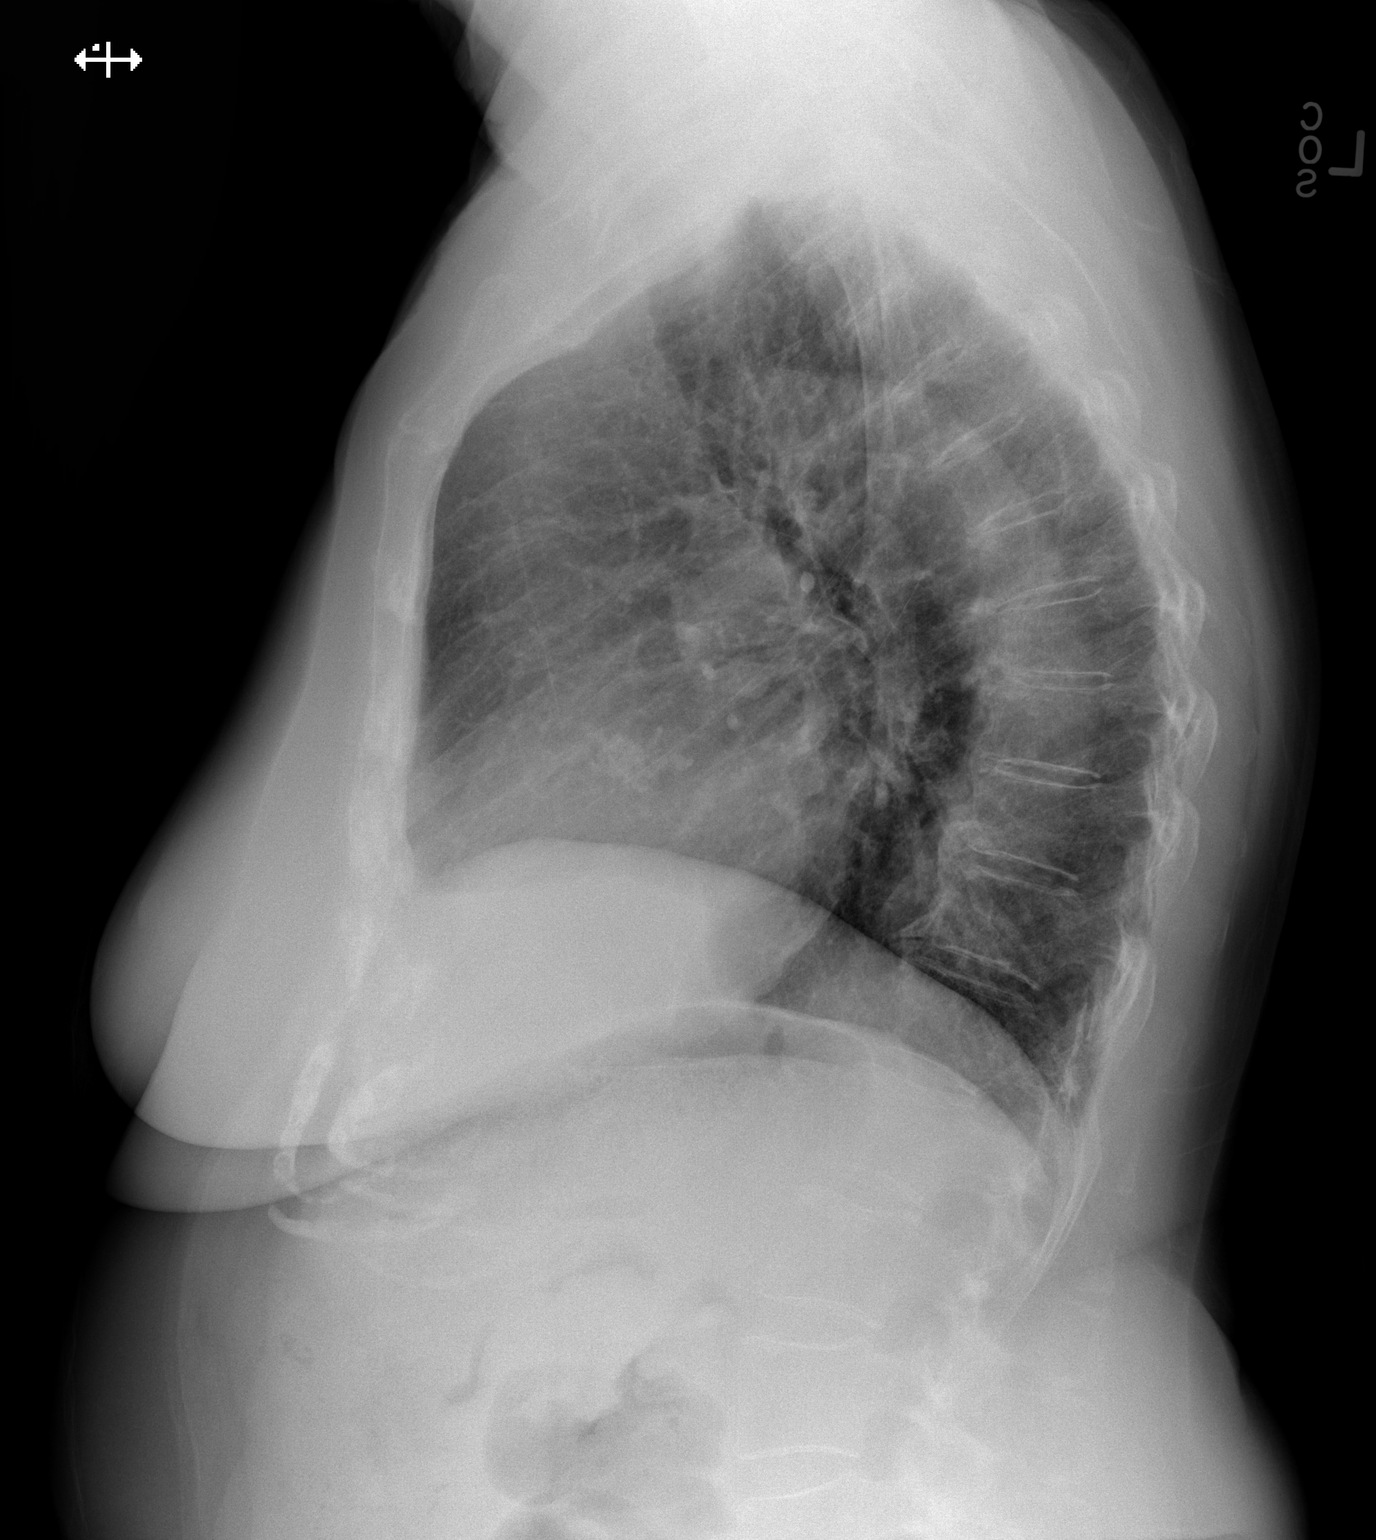

[2 of 2 positions shown; findings below may reference images not displayed]

FINDINGS: The lungs are adequately inflated. There is no focal infiltrate.
There is no pleural effusion. The heart and pulmonary vascularity
are normal. The mediastinum is normal in width. There is mild
tortuosity of the descending thoracic aorta. The bony thorax
exhibits no acute abnormality.
IMPRESSION: There is no active cardiopulmonary disease.

## 2017-07-10 ENCOUNTER — Ambulatory Visit (INDEPENDENT_AMBULATORY_CARE_PROVIDER_SITE_OTHER): Payer: PPO

## 2017-07-10 DIAGNOSIS — Z5181 Encounter for therapeutic drug level monitoring: Secondary | ICD-10-CM | POA: Diagnosis not present

## 2017-07-10 DIAGNOSIS — I6310 Cerebral infarction due to embolism of unspecified precerebral artery: Secondary | ICD-10-CM

## 2017-07-10 DIAGNOSIS — I350 Nonrheumatic aortic (valve) stenosis: Secondary | ICD-10-CM | POA: Diagnosis not present

## 2017-07-10 DIAGNOSIS — Z953 Presence of xenogenic heart valve: Secondary | ICD-10-CM | POA: Diagnosis not present

## 2017-07-10 LAB — POCT INR: INR: 2.1

## 2017-07-13 DIAGNOSIS — N390 Urinary tract infection, site not specified: Secondary | ICD-10-CM | POA: Diagnosis not present

## 2017-07-13 DIAGNOSIS — R300 Dysuria: Secondary | ICD-10-CM | POA: Diagnosis not present

## 2017-07-18 DIAGNOSIS — G4733 Obstructive sleep apnea (adult) (pediatric): Secondary | ICD-10-CM | POA: Diagnosis not present

## 2017-07-18 DIAGNOSIS — R269 Unspecified abnormalities of gait and mobility: Secondary | ICD-10-CM | POA: Diagnosis not present

## 2017-07-18 DIAGNOSIS — I471 Supraventricular tachycardia: Secondary | ICD-10-CM | POA: Diagnosis not present

## 2017-07-22 ENCOUNTER — Other Ambulatory Visit: Payer: Self-pay | Admitting: Cardiovascular Disease

## 2017-08-05 DIAGNOSIS — Z7901 Long term (current) use of anticoagulants: Secondary | ICD-10-CM | POA: Diagnosis not present

## 2017-08-06 DIAGNOSIS — M48062 Spinal stenosis, lumbar region with neurogenic claudication: Secondary | ICD-10-CM | POA: Diagnosis not present

## 2017-08-06 DIAGNOSIS — M5416 Radiculopathy, lumbar region: Secondary | ICD-10-CM | POA: Diagnosis not present

## 2017-08-07 ENCOUNTER — Telehealth: Payer: Self-pay | Admitting: Cardiovascular Disease

## 2017-08-07 ENCOUNTER — Ambulatory Visit (INDEPENDENT_AMBULATORY_CARE_PROVIDER_SITE_OTHER): Payer: PPO

## 2017-08-07 DIAGNOSIS — Z953 Presence of xenogenic heart valve: Secondary | ICD-10-CM | POA: Diagnosis not present

## 2017-08-07 DIAGNOSIS — I6310 Cerebral infarction due to embolism of unspecified precerebral artery: Secondary | ICD-10-CM | POA: Diagnosis not present

## 2017-08-07 DIAGNOSIS — I350 Nonrheumatic aortic (valve) stenosis: Secondary | ICD-10-CM

## 2017-08-07 DIAGNOSIS — Z5181 Encounter for therapeutic drug level monitoring: Secondary | ICD-10-CM | POA: Diagnosis not present

## 2017-08-07 LAB — POCT INR: INR: 2.9

## 2017-08-07 NOTE — Telephone Encounter (Signed)
Please see anti-coag note for today. 

## 2017-08-07 NOTE — Telephone Encounter (Signed)
Pt states her coumadin was 2.9 .

## 2017-08-28 ENCOUNTER — Ambulatory Visit: Payer: PPO

## 2017-08-28 DIAGNOSIS — Z5181 Encounter for therapeutic drug level monitoring: Secondary | ICD-10-CM

## 2017-08-28 DIAGNOSIS — I6310 Cerebral infarction due to embolism of unspecified precerebral artery: Secondary | ICD-10-CM

## 2017-08-28 DIAGNOSIS — Z953 Presence of xenogenic heart valve: Secondary | ICD-10-CM

## 2017-08-28 DIAGNOSIS — I350 Nonrheumatic aortic (valve) stenosis: Secondary | ICD-10-CM

## 2017-08-28 LAB — POCT INR: INR: 2.9

## 2017-09-10 ENCOUNTER — Telehealth: Payer: Self-pay

## 2017-09-10 NOTE — Telephone Encounter (Signed)
Called patient to fu on billing question lm with spouse to call office

## 2017-09-14 ENCOUNTER — Other Ambulatory Visit: Payer: Self-pay | Admitting: Cardiovascular Disease

## 2017-09-18 ENCOUNTER — Ambulatory Visit (INDEPENDENT_AMBULATORY_CARE_PROVIDER_SITE_OTHER): Payer: PPO

## 2017-09-18 DIAGNOSIS — Z953 Presence of xenogenic heart valve: Secondary | ICD-10-CM | POA: Diagnosis not present

## 2017-09-18 DIAGNOSIS — I350 Nonrheumatic aortic (valve) stenosis: Secondary | ICD-10-CM | POA: Diagnosis not present

## 2017-09-18 DIAGNOSIS — Z5181 Encounter for therapeutic drug level monitoring: Secondary | ICD-10-CM

## 2017-09-18 DIAGNOSIS — I6310 Cerebral infarction due to embolism of unspecified precerebral artery: Secondary | ICD-10-CM | POA: Diagnosis not present

## 2017-09-18 LAB — POCT INR: INR: 2.7

## 2017-09-18 NOTE — Patient Instructions (Signed)
Please start new dose of  taking 1.5 tablets every day.   Recheck INR in 3 weeks.

## 2017-10-09 ENCOUNTER — Ambulatory Visit (INDEPENDENT_AMBULATORY_CARE_PROVIDER_SITE_OTHER): Payer: PPO

## 2017-10-09 ENCOUNTER — Other Ambulatory Visit: Payer: Self-pay

## 2017-10-09 DIAGNOSIS — I060 Rheumatic aortic stenosis: Secondary | ICD-10-CM | POA: Diagnosis not present

## 2017-10-09 DIAGNOSIS — Z953 Presence of xenogenic heart valve: Secondary | ICD-10-CM | POA: Diagnosis not present

## 2017-10-09 DIAGNOSIS — Z5181 Encounter for therapeutic drug level monitoring: Secondary | ICD-10-CM | POA: Diagnosis not present

## 2017-10-09 DIAGNOSIS — I6310 Cerebral infarction due to embolism of unspecified precerebral artery: Secondary | ICD-10-CM

## 2017-10-09 DIAGNOSIS — I350 Nonrheumatic aortic (valve) stenosis: Secondary | ICD-10-CM | POA: Diagnosis not present

## 2017-10-09 LAB — POCT INR: INR: 2.1

## 2017-10-09 NOTE — Patient Instructions (Signed)
Please continue current dose of  taking 1.5 tablets every day.  Recheck INR in 3 weeks.

## 2017-10-10 ENCOUNTER — Other Ambulatory Visit: Payer: PPO

## 2017-10-30 ENCOUNTER — Ambulatory Visit (INDEPENDENT_AMBULATORY_CARE_PROVIDER_SITE_OTHER): Payer: PPO

## 2017-10-30 DIAGNOSIS — I350 Nonrheumatic aortic (valve) stenosis: Secondary | ICD-10-CM

## 2017-10-30 DIAGNOSIS — Z953 Presence of xenogenic heart valve: Secondary | ICD-10-CM | POA: Diagnosis not present

## 2017-10-30 DIAGNOSIS — Z5181 Encounter for therapeutic drug level monitoring: Secondary | ICD-10-CM | POA: Diagnosis not present

## 2017-10-30 DIAGNOSIS — I6310 Cerebral infarction due to embolism of unspecified precerebral artery: Secondary | ICD-10-CM | POA: Diagnosis not present

## 2017-10-30 LAB — POCT INR: INR: 1.8

## 2017-10-30 NOTE — Patient Instructions (Signed)
Please take 2 tablets tonight, then continue current dose of  taking 1.5 tablets every day.  Recheck INR on 1/28 (before your back injection). Dr. Sharlet Salina wants your INR to be <3.0.

## 2017-11-18 ENCOUNTER — Ambulatory Visit (INDEPENDENT_AMBULATORY_CARE_PROVIDER_SITE_OTHER): Payer: PPO

## 2017-11-18 DIAGNOSIS — I6310 Cerebral infarction due to embolism of unspecified precerebral artery: Secondary | ICD-10-CM | POA: Diagnosis not present

## 2017-11-18 DIAGNOSIS — Z5181 Encounter for therapeutic drug level monitoring: Secondary | ICD-10-CM

## 2017-11-18 DIAGNOSIS — M48062 Spinal stenosis, lumbar region with neurogenic claudication: Secondary | ICD-10-CM | POA: Diagnosis not present

## 2017-11-18 DIAGNOSIS — I350 Nonrheumatic aortic (valve) stenosis: Secondary | ICD-10-CM

## 2017-11-18 DIAGNOSIS — Z953 Presence of xenogenic heart valve: Secondary | ICD-10-CM

## 2017-11-18 DIAGNOSIS — M5416 Radiculopathy, lumbar region: Secondary | ICD-10-CM | POA: Diagnosis not present

## 2017-11-18 DIAGNOSIS — M5030 Other cervical disc degeneration, unspecified cervical region: Secondary | ICD-10-CM | POA: Diagnosis not present

## 2017-11-18 LAB — POCT INR: INR: 1.9

## 2017-11-18 NOTE — Patient Instructions (Signed)
Please take 2 tablets tonight, then start new dosage of 1.5 tablets everyday, except 2 tablets on Mondays and Fridays. Recheck in 2 weeks.

## 2017-12-02 ENCOUNTER — Ambulatory Visit (INDEPENDENT_AMBULATORY_CARE_PROVIDER_SITE_OTHER): Payer: PPO

## 2017-12-02 DIAGNOSIS — I350 Nonrheumatic aortic (valve) stenosis: Secondary | ICD-10-CM

## 2017-12-02 DIAGNOSIS — I6310 Cerebral infarction due to embolism of unspecified precerebral artery: Secondary | ICD-10-CM | POA: Diagnosis not present

## 2017-12-02 DIAGNOSIS — Z953 Presence of xenogenic heart valve: Secondary | ICD-10-CM | POA: Diagnosis not present

## 2017-12-02 DIAGNOSIS — Z5181 Encounter for therapeutic drug level monitoring: Secondary | ICD-10-CM

## 2017-12-02 LAB — POCT INR: INR: 2.1

## 2017-12-02 NOTE — Patient Instructions (Signed)
Please continue dosage of 1.5 tablets everyday, except 2 tablets on Mondays and Fridays. Recheck in 4 weeks.

## 2017-12-19 DIAGNOSIS — Z79899 Other long term (current) drug therapy: Secondary | ICD-10-CM | POA: Diagnosis not present

## 2017-12-19 DIAGNOSIS — E7800 Pure hypercholesterolemia, unspecified: Secondary | ICD-10-CM | POA: Diagnosis not present

## 2017-12-26 DIAGNOSIS — Z0000 Encounter for general adult medical examination without abnormal findings: Secondary | ICD-10-CM | POA: Diagnosis not present

## 2018-01-01 ENCOUNTER — Ambulatory Visit (INDEPENDENT_AMBULATORY_CARE_PROVIDER_SITE_OTHER): Payer: PPO

## 2018-01-01 DIAGNOSIS — I6310 Cerebral infarction due to embolism of unspecified precerebral artery: Secondary | ICD-10-CM

## 2018-01-01 DIAGNOSIS — Z5181 Encounter for therapeutic drug level monitoring: Secondary | ICD-10-CM | POA: Diagnosis not present

## 2018-01-01 DIAGNOSIS — I350 Nonrheumatic aortic (valve) stenosis: Secondary | ICD-10-CM | POA: Diagnosis not present

## 2018-01-01 DIAGNOSIS — Z953 Presence of xenogenic heart valve: Secondary | ICD-10-CM

## 2018-01-01 LAB — POCT INR: INR: 2.2

## 2018-01-01 NOTE — Patient Instructions (Signed)
Please continue dosage of 1.5 tablets everyday, except 2 tablets on Mondays and Fridays. Recheck in 6 weeks.

## 2018-01-09 DIAGNOSIS — M48062 Spinal stenosis, lumbar region with neurogenic claudication: Secondary | ICD-10-CM | POA: Diagnosis not present

## 2018-01-09 DIAGNOSIS — M7062 Trochanteric bursitis, left hip: Secondary | ICD-10-CM | POA: Diagnosis not present

## 2018-01-09 DIAGNOSIS — M7061 Trochanteric bursitis, right hip: Secondary | ICD-10-CM | POA: Diagnosis not present

## 2018-01-20 ENCOUNTER — Other Ambulatory Visit: Payer: Self-pay | Admitting: Cardiovascular Disease

## 2018-01-20 NOTE — Telephone Encounter (Signed)
Please review for refill, Thanks !  

## 2018-01-21 DIAGNOSIS — M545 Low back pain: Secondary | ICD-10-CM | POA: Diagnosis not present

## 2018-01-21 DIAGNOSIS — G8929 Other chronic pain: Secondary | ICD-10-CM | POA: Diagnosis not present

## 2018-01-29 DIAGNOSIS — G8929 Other chronic pain: Secondary | ICD-10-CM | POA: Diagnosis not present

## 2018-01-29 DIAGNOSIS — M545 Low back pain: Secondary | ICD-10-CM | POA: Diagnosis not present

## 2018-02-05 ENCOUNTER — Ambulatory Visit (INDEPENDENT_AMBULATORY_CARE_PROVIDER_SITE_OTHER): Payer: PPO

## 2018-02-05 DIAGNOSIS — I350 Nonrheumatic aortic (valve) stenosis: Secondary | ICD-10-CM

## 2018-02-05 DIAGNOSIS — I6310 Cerebral infarction due to embolism of unspecified precerebral artery: Secondary | ICD-10-CM

## 2018-02-05 DIAGNOSIS — Z5181 Encounter for therapeutic drug level monitoring: Secondary | ICD-10-CM

## 2018-02-05 DIAGNOSIS — Z953 Presence of xenogenic heart valve: Secondary | ICD-10-CM

## 2018-02-05 LAB — POCT INR: INR: 1.6

## 2018-02-05 NOTE — Patient Instructions (Signed)
Please take 2 tablets tonight and tomorrow, then continue dosage of 1.5 tablets everyday, except 2 tablets on Mondays and Fridays. Recheck in 4 weeks.

## 2018-02-06 DIAGNOSIS — G8929 Other chronic pain: Secondary | ICD-10-CM | POA: Diagnosis not present

## 2018-02-06 DIAGNOSIS — M545 Low back pain: Secondary | ICD-10-CM | POA: Diagnosis not present

## 2018-02-17 DIAGNOSIS — M545 Low back pain: Secondary | ICD-10-CM | POA: Diagnosis not present

## 2018-02-17 DIAGNOSIS — G8929 Other chronic pain: Secondary | ICD-10-CM | POA: Diagnosis not present

## 2018-02-26 DIAGNOSIS — M545 Low back pain: Secondary | ICD-10-CM | POA: Diagnosis not present

## 2018-02-26 DIAGNOSIS — G8929 Other chronic pain: Secondary | ICD-10-CM | POA: Diagnosis not present

## 2018-03-11 DIAGNOSIS — N3090 Cystitis, unspecified without hematuria: Secondary | ICD-10-CM | POA: Diagnosis not present

## 2018-03-11 DIAGNOSIS — R350 Frequency of micturition: Secondary | ICD-10-CM | POA: Diagnosis not present

## 2018-03-12 ENCOUNTER — Ambulatory Visit (INDEPENDENT_AMBULATORY_CARE_PROVIDER_SITE_OTHER): Payer: PPO

## 2018-03-12 DIAGNOSIS — I6310 Cerebral infarction due to embolism of unspecified precerebral artery: Secondary | ICD-10-CM

## 2018-03-12 DIAGNOSIS — I350 Nonrheumatic aortic (valve) stenosis: Secondary | ICD-10-CM | POA: Diagnosis not present

## 2018-03-12 DIAGNOSIS — Z953 Presence of xenogenic heart valve: Secondary | ICD-10-CM | POA: Diagnosis not present

## 2018-03-12 DIAGNOSIS — Z5181 Encounter for therapeutic drug level monitoring: Secondary | ICD-10-CM

## 2018-03-12 LAB — POCT INR: INR: 1.8 — AB (ref 2.0–3.0)

## 2018-03-12 NOTE — Patient Instructions (Signed)
Please take 2 tablets tonight then START NEW DOSAGE of 1.5 tablets everyday, except 2 tablets on Mondays, Wednesdays and Fridays. Recheck in 3 weeks.

## 2018-03-17 ENCOUNTER — Encounter: Payer: Self-pay | Admitting: *Deleted

## 2018-03-17 ENCOUNTER — Emergency Department
Admission: EM | Admit: 2018-03-17 | Discharge: 2018-03-17 | Disposition: A | Discharge status: Home / Self Care | Payer: PPO | Attending: Emergency Medicine | Admitting: Emergency Medicine

## 2018-03-17 ENCOUNTER — Other Ambulatory Visit: Payer: Self-pay

## 2018-03-17 DIAGNOSIS — Z7901 Long term (current) use of anticoagulants: Secondary | ICD-10-CM | POA: Diagnosis not present

## 2018-03-17 DIAGNOSIS — Z96653 Presence of artificial knee joint, bilateral: Secondary | ICD-10-CM | POA: Insufficient documentation

## 2018-03-17 DIAGNOSIS — Y939 Activity, unspecified: Secondary | ICD-10-CM | POA: Diagnosis not present

## 2018-03-17 DIAGNOSIS — S8992XA Unspecified injury of left lower leg, initial encounter: Secondary | ICD-10-CM | POA: Diagnosis present

## 2018-03-17 DIAGNOSIS — S8012XA Contusion of left lower leg, initial encounter: Secondary | ICD-10-CM | POA: Diagnosis not present

## 2018-03-17 DIAGNOSIS — Y999 Unspecified external cause status: Secondary | ICD-10-CM | POA: Insufficient documentation

## 2018-03-17 DIAGNOSIS — R42 Dizziness and giddiness: Secondary | ICD-10-CM | POA: Diagnosis not present

## 2018-03-17 DIAGNOSIS — I5032 Chronic diastolic (congestive) heart failure: Secondary | ICD-10-CM | POA: Insufficient documentation

## 2018-03-17 DIAGNOSIS — Z79899 Other long term (current) drug therapy: Secondary | ICD-10-CM | POA: Diagnosis not present

## 2018-03-17 DIAGNOSIS — Y929 Unspecified place or not applicable: Secondary | ICD-10-CM | POA: Insufficient documentation

## 2018-03-17 DIAGNOSIS — I110 Hypertensive heart disease with heart failure: Secondary | ICD-10-CM | POA: Diagnosis not present

## 2018-03-17 DIAGNOSIS — W2203XA Walked into furniture, initial encounter: Secondary | ICD-10-CM | POA: Insufficient documentation

## 2018-03-17 DIAGNOSIS — W19XXXA Unspecified fall, initial encounter: Secondary | ICD-10-CM

## 2018-03-17 LAB — BASIC METABOLIC PANEL
Anion gap: 8 (ref 5–15)
BUN: 22 mg/dL — ABNORMAL HIGH (ref 6–20)
CALCIUM: 8.9 mg/dL (ref 8.9–10.3)
CHLORIDE: 106 mmol/L (ref 101–111)
CO2: 26 mmol/L (ref 22–32)
Creatinine, Ser: 1.02 mg/dL — ABNORMAL HIGH (ref 0.44–1.00)
GFR calc non Af Amer: 51 mL/min — ABNORMAL LOW (ref >60)
GFR, EST AFRICAN AMERICAN: 59 mL/min — AB (ref >60)
Glucose, Bld: 85 mg/dL (ref 65–99)
Potassium: 3.9 mmol/L (ref 3.5–5.1)
SODIUM: 140 mmol/L (ref 135–145)

## 2018-03-17 LAB — TROPONIN I: Troponin I: <0.03 ng/mL (ref <0.03)

## 2018-03-17 LAB — CBC
HCT: 36.1 % (ref 35.0–47.0)
Hemoglobin: 12 g/dL (ref 12.0–16.0)
MCH: 29 pg (ref 26.0–34.0)
MCHC: 33.3 g/dL (ref 32.0–36.0)
MCV: 87.1 fL (ref 80.0–100.0)
Platelets: 170 10*3/uL (ref 150–440)
RBC: 4.14 MIL/uL (ref 3.80–5.20)
RDW: 14.3 % (ref 11.5–14.5)
WBC: 5.3 10*3/uL (ref 3.6–11.0)

## 2018-03-17 NOTE — ED Provider Notes (Signed)
[facility] Emergency Department Provider Note  ____________________________________________   First MD Initiated Contact with Patient 03/17/18 1819     (approximate)  I have reviewed the triage vital signs and the nursing notes.   HISTORY  Chief Complaint Fall    HPI Kayla Gray is a 82 y.o. female whose medical history includes paroxysmal atrial fibrillation and who is status post aortic valve replacement on warfarin.  She presents for evaluation after a fall today.  She reports that she had stood up and was reaching for something and then turned around and fell.  She does not know why exactly she fell but she did not lose consciousness, did not strike her head, and has no neck pain.  She struck her left lower leg on a dresser and has a little bit of bruising and swelling.  She had a little bit of dizziness after the fall.  She reports that she did not take her blood pressure medication today and that it was elevated at home which was of concern to the patient and her husband.  She reportedly has had a stroke and/or TIA in the past so they were little concerned about this as well.  She is asymptomatic at this time except for some mild pain and swelling and bruising to the the left lower leg just below the knee.  She is able to ambulate without difficulty and has no significant amount of pain.  She denies fever/chills, chest pain, shortness of breath, nausea, vomiting, and abdominal pain.   Past Medical History:  Diagnosis Date  . Aortic stenosis   . Bilateral cataracts   . Bleeding nose    Thurs night (09/08/15) and Fri (09/09/15) left side.Bleeding didn't last long  . Breast cancer ([place]) 1999   right breast lumpectomy and rad tx  . Chronic diastolic (congestive) heart failure ([place])   . Depression   . Diverticulosis   . Gastroesophageal reflux disease   . Heart murmur   . Hyperlipidemia   . Hypertension   . Iron deficiency anemia   . Obstructive  sleep apnea   . Osteoarthritis   . Osteopenia   . Peptic ulcer disease   . S/P aortic valve replacement with bioprosthetic valve 09/16/2015   23 mm Edwards Intuity Elite bioprosthetic tissue valve  . Stroke ([place])    11/14/13  . Supraventricular tachycardia ([place])   . Syncope and collapse   . Urinary tract infection     Patient Active Problem List   Diagnosis Date Noted  . Paroxysmal atrial fibrillation ([place]) 11/14/2015  . Encounter for therapeutic drug monitoring 09/29/2015  . S/P aortic valve replacement with bioprosthetic valve 09/16/2015  . Chronic diastolic (congestive) heart failure ([place])   . SOB (shortness of breath) 08/17/2015  . Angina pectoris ([place]) 08/17/2015  . Severe aortic valve stenosis 08/12/2015  . Low back pain 08/12/2015  . Sinus tachycardia 01/06/2014  . Osteoarthritis of both knees 01/06/2014  . CVA (cerebral vascular accident) ([place]) 11/25/2013  . Aortic valve stenosis 11/27/2012  . SVT (supraventricular tachycardia) ([place]) 11/27/2012  . Syncope 11/27/2012    Past Surgical History:  Procedure Laterality Date  . AORTIC VALVE REPLACEMENT N/A 09/16/2015   Procedure: AORTIC VALVE REPLACEMENT (AVR);  Surgeon: Rexene Alberts, MD;  Location: [place];  Service: Open Heart Surgery;  Laterality: N/A;  . BREAST BIOPSY Right 1999   breast ca radation  . BREAST EXCISIONAL BIOPSY Left yrs ago    benign  . BREAST LUMPECTOMY  Right 1999   with radiation  . CARDIAC CATHETERIZATION N/A 08/17/2015   Procedure: Left Heart Cath and Coronary Angiography;  Surgeon: Minna Merritts, MD;  Location: [place] [facility];  Service: Cardiovascular;  Laterality: N/A;  . CATARACT EXTRACTION, BILATERAL    . CHOLECYSTECTOMY    . JOINT REPLACEMENT Bilateral    knee replacement  . KNEE ARTHROSCOPY     right  . KNEE ARTHROSCOPY     left  . TEE WITHOUT CARDIOVERSION N/A 09/16/2015   Procedure: TRANSESOPHAGEAL ECHOCARDIOGRAM (TEE);  Surgeon: Rexene Alberts, MD;  Location: [place];  Service:  Open Heart Surgery;  Laterality: N/A;  . TONSILLECTOMY    . VAGINAL HYSTERECTOMY      Prior to Admission medications   Medication Sig Start Date End Date Taking? Authorizing Provider  acetaminophen (TYLENOL) 325 MG tablet Take 650 mg by mouth every 6 (six) hours as needed.    [provider]  Calcium Carbonate-Vitamin D (CALCIUM 600 + D PO) Take by mouth 2 (two) times daily.    [provider]  ezetimibe (ZETIA) 10 MG tablet Take 10 mg by mouth daily.    [provider]  fluticasone (FLONASE) 50 MCG/ACT nasal spray Place 2 sprays into the nose daily as needed.    [provider]  metoprolol tartrate (LOPRESSOR) 25 MG tablet TAKE 1 TABLET (25 MG TOTAL) BY MOUTH 2 (TWO) TIMES DAILY. 09/16/17   Minna Merritts, MD  Multiple Vitamin (MULTIVITAMIN) tablet Take 1 tablet by mouth daily.    [provider]  pantoprazole (PROTONIX) 40 MG tablet Take 40 mg by mouth daily as needed.  07/04/14   [provider]  PARoxetine (PAXIL) 20 MG tablet Take 20 mg by mouth daily.    [provider]  warfarin (COUMADIN) 2.5 MG tablet TAKE AS DIRECTED BY COUMADIN CLINIC 01/20/18   Minna Merritts, MD    Allergies Codeine; Evista [raloxifene]; Statins; and Sulfa antibiotics  Family History  Problem Relation Age of Onset  . Heart failure Mother   . Breast cancer Maternal Aunt     Social History Social History   Tobacco Use  . Smoking status: Never Smoker  . Smokeless tobacco: Never Used  Substance Use Topics  . Alcohol use: Yes    Comment: wine  . Drug use: No    Review of Systems Constitutional: No fever/chills Eyes: No visual changes. ENT: No sore throat. Cardiovascular: Denies chest pain. Respiratory: Denies shortness of breath. Gastrointestinal: No abdominal pain.  No nausea, no vomiting.  No diarrhea.  No constipation. Genitourinary: Negative for dysuria. Musculoskeletal: Negative for neck pain.  Negative for back pain.  See  skin exam below Integumentary: Negative for rash.  Some bruising and swelling to the upper part of her left lower leg where she struck the dresser as described above Neurological: Negative for headaches, focal weakness or numbness.  Brief dizziness, now resolved.  ____________________________________________   PHYSICAL EXAM:  VITAL SIGNS: ED Triage Vitals [03/17/18 1723]  Enc Vitals Group     BP (!) 136/119     Pulse Rate 71     Resp 20     Temp 98.8 F (37.1 C)     Temp Source Oral     SpO2 98 %     Weight 78 kg (172 lb)     Height 1.575 m (5\' 2" )     Head Circumference      Peak Flow      Pain Score  10     Pain Loc      Pain Edu?      Excl. in [place]?     Constitutional: Alert and oriented. Well appearing and in no acute distress. Eyes: Conjunctivae are normal.  Head: Atraumatic. Nose: No congestion/rhinnorhea. Mouth/Throat: Mucous membranes are moist. Neck: No stridor.  No meningeal signs.   Cardiovascular: Normal rate, regular rhythm. Good peripheral circulation. Grossly normal heart sounds. Respiratory: Normal respiratory effort.  No retractions. Lungs CTAB. Gastrointestinal: Soft and nontender. No distention.  Musculoskeletal: Several centimeter in diameter hematoma to proximal lateral left lower extremity consistent with a mild contusion.  No abrasion or laceration.  Mild tenderness to palpation. Neurologic:  Normal speech and language. No gross focal neurologic deficits are appreciated.  Skin:  Skin is warm, dry and intact. No rash noted.  See MSK exam above Psychiatric: Mood and affect are normal. Speech and behavior are normal.  ____________________________________________   LABS (all labs ordered are listed, but only abnormal results are displayed)  Labs Reviewed  BASIC METABOLIC PANEL - Abnormal; Notable for the following components:      Result Value   BUN 22 (*)    Creatinine, Ser 1.02 (*)    GFR calc non Af Amer 51 (*)    GFR calc Af Amer 59 (*)     All other components within normal limits  CBC  TROPONIN I   ____________________________________________  EKG  ED ECG REPORT I, Hinda Kehr, the attending physician, personally viewed and interpreted this ECG.  Date: 03/17/2018 EKG Time: 17:28 Rate: 68 Rhythm: normal sinus rhythm QRS Axis: normal Intervals: normal, borderline LVH ST/T Wave abnormalities: normal Narrative Interpretation: no evidence of acute ischemia  ____________________________________________  RADIOLOGY   ED MD interpretation: No indication for imaging  Official radiology report(s): No results found.  ____________________________________________   PROCEDURES  Critical Care performed: No   Procedure(s) performed:   Procedures   ____________________________________________   INITIAL IMPRESSION / ASSESSMENT AND PLAN / ED COURSE  As part of my medical decision making, I reviewed the following data within the [place] History obtained from family, Nursing notes reviewed and incorporated, Labs reviewed , EKG interpreted  and Old chart reviewed    Differential diagnosis includes, but is not limited to, mechanical fall with contusion, CVA, ACS, electrolyte abnormality, acute infection.  Fortunately believe that the patient simply had a mechanical fall and has a mild contusion.  I reviewed her lab work and it is all within normal limits.  We did not check an INR as there is no evidence of acute or severe bleeding.  I told her about the hematoma and discussed RICE with her.  I explained that there was no indication for imaging of her head at this time given that the symptoms do not sound consistent with TIA or CVA.  The patient and her husband are comfortable with the plan for discharge and outpatient follow-up. I gave my usual and customary return precautions.      ____________________________________________  FINAL CLINICAL IMPRESSION(S) / ED DIAGNOSES  Final diagnoses:    Fall, initial encounter  Contusion of left lower leg, initial encounter  Traumatic hematoma of left lower leg, initial encounter     MEDICATIONS GIVEN DURING THIS VISIT:  Medications - No data to display   ED Discharge Orders    None       Note:  This document was prepared using Dragon voice recognition software and may include unintentional dictation errors.  Hinda Kehr, MD 03/17/18 1901

## 2018-03-17 NOTE — ED Triage Notes (Addendum)
Pt to triage via wheelchair. Pt fell today and struck left lower leg on the dresser.  Pt reports dizziness   Pt did not take blood pressure meds today  No headache. Pt alert  Speech clear

## 2018-03-17 NOTE — Discharge Instructions (Signed)

## 2018-03-31 DIAGNOSIS — C44722 Squamous cell carcinoma of skin of right lower limb, including hip: Secondary | ICD-10-CM | POA: Diagnosis not present

## 2018-03-31 DIAGNOSIS — D485 Neoplasm of uncertain behavior of skin: Secondary | ICD-10-CM | POA: Diagnosis not present

## 2018-04-02 ENCOUNTER — Ambulatory Visit (INDEPENDENT_AMBULATORY_CARE_PROVIDER_SITE_OTHER): Payer: PPO

## 2018-04-02 DIAGNOSIS — I350 Nonrheumatic aortic (valve) stenosis: Secondary | ICD-10-CM | POA: Diagnosis not present

## 2018-04-02 DIAGNOSIS — I6310 Cerebral infarction due to embolism of unspecified precerebral artery: Secondary | ICD-10-CM

## 2018-04-02 DIAGNOSIS — Z953 Presence of xenogenic heart valve: Secondary | ICD-10-CM | POA: Diagnosis not present

## 2018-04-02 DIAGNOSIS — Z5181 Encounter for therapeutic drug level monitoring: Secondary | ICD-10-CM | POA: Diagnosis not present

## 2018-04-02 LAB — POCT INR: INR: 2.7 (ref 2.0–3.0)

## 2018-04-02 NOTE — Patient Instructions (Signed)
Please take 1.5 tablets tonight, then resume dosage of 1.5 tablets everyday, except 2 tablets on Mondays, Wednesdays and Fridays. Recheck in 4 weeks.

## 2018-04-14 ENCOUNTER — Telehealth: Payer: Self-pay | Admitting: Cardiovascular Disease

## 2018-04-14 DIAGNOSIS — C44722 Squamous cell carcinoma of skin of right lower limb, including hip: Secondary | ICD-10-CM | POA: Diagnosis not present

## 2018-04-14 NOTE — Telephone Encounter (Signed)
Spoke w/ pt.  She states that she had a biopsy on her leg and was found to have a spot of skin cancer that will be removed today @ 11:00. She reports that operating MD recommended she remain on her coumadin through procedure and she just wants to make me aware.  Advised pt that MD would typically recommend holding coumadin if they were expecting a lot of bleeding w/ an invasive procedure, so she should continue to take her coumadin as prescribed. She verbalizes understanding and is appreciative of the explanation.  Asked her to call back w/ any further questions or concerns.

## 2018-04-14 NOTE — Telephone Encounter (Signed)
Pt states she is having skin cancer removed today and needs to know if she should stop it. Please call.

## 2018-04-25 ENCOUNTER — Other Ambulatory Visit: Payer: Self-pay | Admitting: Cardiovascular Disease

## 2018-04-25 NOTE — Telephone Encounter (Signed)
Please review for refill. Thanks!  

## 2018-04-30 ENCOUNTER — Ambulatory Visit: Payer: PPO

## 2018-04-30 DIAGNOSIS — Z5181 Encounter for therapeutic drug level monitoring: Secondary | ICD-10-CM

## 2018-04-30 DIAGNOSIS — I6310 Cerebral infarction due to embolism of unspecified precerebral artery: Secondary | ICD-10-CM

## 2018-04-30 DIAGNOSIS — I350 Nonrheumatic aortic (valve) stenosis: Secondary | ICD-10-CM | POA: Diagnosis not present

## 2018-04-30 DIAGNOSIS — Z953 Presence of xenogenic heart valve: Secondary | ICD-10-CM | POA: Diagnosis not present

## 2018-04-30 LAB — POCT INR: INR: 2.6 (ref 2.0–3.0)

## 2018-04-30 NOTE — Patient Instructions (Signed)
Please have a large serving of greens tonight and resume dosage of 1.5 tablets everyday, except 2 tablets on Mondays, Wednesdays and Fridays. Recheck in 5 weeks.

## 2018-06-04 ENCOUNTER — Ambulatory Visit: Payer: PPO

## 2018-06-04 DIAGNOSIS — Z5181 Encounter for therapeutic drug level monitoring: Secondary | ICD-10-CM

## 2018-06-04 DIAGNOSIS — I350 Nonrheumatic aortic (valve) stenosis: Secondary | ICD-10-CM | POA: Diagnosis not present

## 2018-06-04 DIAGNOSIS — Z953 Presence of xenogenic heart valve: Secondary | ICD-10-CM | POA: Diagnosis not present

## 2018-06-04 DIAGNOSIS — I6310 Cerebral infarction due to embolism of unspecified precerebral artery: Secondary | ICD-10-CM

## 2018-06-04 LAB — POCT INR: INR: 2.6 (ref 2.0–3.0)

## 2018-06-04 NOTE — Patient Instructions (Signed)
Please take 1 tablet tonight, then resume dosage of 1.5 tablets everyday, except 2 tablets on Mondays, Wednesdays and Fridays. Recheck in 5 weeks.

## 2018-06-23 DIAGNOSIS — E7800 Pure hypercholesterolemia, unspecified: Secondary | ICD-10-CM | POA: Diagnosis not present

## 2018-06-23 DIAGNOSIS — Z79899 Other long term (current) drug therapy: Secondary | ICD-10-CM | POA: Diagnosis not present

## 2018-06-25 DIAGNOSIS — M5416 Radiculopathy, lumbar region: Secondary | ICD-10-CM | POA: Diagnosis not present

## 2018-06-25 DIAGNOSIS — Z7901 Long term (current) use of anticoagulants: Secondary | ICD-10-CM | POA: Diagnosis not present

## 2018-06-26 DIAGNOSIS — D2272 Melanocytic nevi of left lower limb, including hip: Secondary | ICD-10-CM | POA: Diagnosis not present

## 2018-06-26 DIAGNOSIS — M48062 Spinal stenosis, lumbar region with neurogenic claudication: Secondary | ICD-10-CM | POA: Diagnosis not present

## 2018-06-26 DIAGNOSIS — M5416 Radiculopathy, lumbar region: Secondary | ICD-10-CM | POA: Diagnosis not present

## 2018-06-26 DIAGNOSIS — D485 Neoplasm of uncertain behavior of skin: Secondary | ICD-10-CM | POA: Diagnosis not present

## 2018-06-26 DIAGNOSIS — L570 Actinic keratosis: Secondary | ICD-10-CM | POA: Diagnosis not present

## 2018-06-26 DIAGNOSIS — D2262 Melanocytic nevi of left upper limb, including shoulder: Secondary | ICD-10-CM | POA: Diagnosis not present

## 2018-06-26 DIAGNOSIS — Z08 Encounter for follow-up examination after completed treatment for malignant neoplasm: Secondary | ICD-10-CM | POA: Diagnosis not present

## 2018-06-26 DIAGNOSIS — L820 Inflamed seborrheic keratosis: Secondary | ICD-10-CM | POA: Diagnosis not present

## 2018-06-26 DIAGNOSIS — L298 Other pruritus: Secondary | ICD-10-CM | POA: Diagnosis not present

## 2018-06-26 DIAGNOSIS — M5030 Other cervical disc degeneration, unspecified cervical region: Secondary | ICD-10-CM | POA: Diagnosis not present

## 2018-06-26 DIAGNOSIS — Z85828 Personal history of other malignant neoplasm of skin: Secondary | ICD-10-CM | POA: Diagnosis not present

## 2018-06-26 DIAGNOSIS — D2271 Melanocytic nevi of right lower limb, including hip: Secondary | ICD-10-CM | POA: Diagnosis not present

## 2018-06-26 DIAGNOSIS — R208 Other disturbances of skin sensation: Secondary | ICD-10-CM | POA: Diagnosis not present

## 2018-06-26 DIAGNOSIS — L538 Other specified erythematous conditions: Secondary | ICD-10-CM | POA: Diagnosis not present

## 2018-06-29 NOTE — Progress Notes (Deleted)
Cardiology Office Note  Date:  06/29/2018   ID:  Kayla Gray, DOB 1936/04/26, MRN [id number]  PCP:  Derinda Late, MD   No chief complaint on file.   HPI:  Kayla Gray is a very pleasant 82 year old woman with history of  severe aortic valve stenosis, status post AVR with bioprosthetic valve 09/16/2015  postoperative atrial fibrillation, started on amiodarone, warfarin syncope (stumbled backwards, fell back hit head, 2013),  hyperlipidemia,  iron deficiency anemia, peptic ulcer disease Status post left total knee replacement 11/15/2011 SVT in February 2013 admitted   elevated troponin felt secondary to demand ischemia from tachycardia.  February 2015 with TIA-type symptoms, 30 day monitor did not show significant arrhythmia She presents today for  Syncope, SVT and bioprosthetic aortic valve  Neck disk problem, ER x 2  predisone x2 Neck and right arm pain If no improvement will get cortisone shot September 17  Otherwise feels well from a cardiac perspective No syncope Rare dizzy, sits back down. Stable, commented on this on her last clinic visit No Tia or stroke symptoms Tolerating warfarin Denies any tachycardia concerning for atrial fibrillation  Lab work reviewed with her in detail from primary care Total chol 182, LDL 98 Unable to tolerate statins, she is tolerating Zetia  EKG personally reviewed by myself on todays visit Shows normal sinus rhythm rate 69 bpm no significant ST or T-wave changes  Other past medical history reviewed In October 20th, 2017 she suffered a syncopal episode while she was out having supper at [employer]. Went to bathroom. Had abdominal pain, urgency, felt that she had to have a bowel movement. She developed lightheadedness, everything went white prior to having her bowel movement. Found herself on the floor, had released her bowels  treated in [facility] emergency department at [facility] where brain CT scan was negative. Seen by neurology, had EEG,  results unavailable, performed 11/14th/2017 Scheduled for  MRI  12/7  Did not participated in cardiac rehabilitation    postoperative atrial fibrillation, started on amiodarone, warfarin not had any problems with her anticoagulation  Echocardiogram after the surgery reviewed with the patient, showing well-seated aortic valve replacement  Previous echocardiogram showing Peak velocity 510 cm/s, mean gradient 54 mmHg, peak gradient 104 mmHg.  She had knee replacement surgery early 2016  Carotid ultrasound August 2012 showed no significant plaque  echocardiogram 01/ 8 2014 showed normal LV function, aortic valve velocity 315 cm/s, peak gradient 56 mm of mercury, mean gradient 19 mm of mercury, estimated aortic valve area 0.61 cm Echocardiogram in late 2014 showing velocity of 400   Stress test August 2012 showed normal EKG with exertion, normal stress echo  Lab work may 2015 showing total cholesterol 200, LDL 111   PMH:   has a past medical history of Aortic stenosis, Bilateral cataracts, Bleeding nose, Breast cancer ([place]) (1999), Chronic diastolic (congestive) heart failure ([place]), Depression, Diverticulosis, Gastroesophageal reflux disease, Heart murmur, Hyperlipidemia, Hypertension, Iron deficiency anemia, Obstructive sleep apnea, Osteoarthritis, Osteopenia, Peptic ulcer disease, S/P aortic valve replacement with bioprosthetic valve (09/16/2015), Stroke ([place]), Supraventricular tachycardia ([place]), Syncope and collapse, and Urinary tract infection.  PSH:    Past Surgical History:  Procedure Laterality Date  . AORTIC VALVE REPLACEMENT N/A 09/16/2015   Procedure: AORTIC VALVE REPLACEMENT (AVR);  Surgeon: Rexene Alberts, MD;  Location: [place];  Service: Open Heart Surgery;  Laterality: N/A;  . BREAST BIOPSY Right 1999   breast ca radation  . BREAST EXCISIONAL BIOPSY Left yrs ago    benign  .  BREAST LUMPECTOMY Right 1999   with radiation  . CARDIAC CATHETERIZATION N/A 08/17/2015    Procedure: Left Heart Cath and Coronary Angiography;  Surgeon: Minna Merritts, MD;  Location: [place] CV [facility];  Service: Cardiovascular;  Laterality: N/A;  . CATARACT EXTRACTION, BILATERAL    . CHOLECYSTECTOMY    . JOINT REPLACEMENT Bilateral    knee replacement  . KNEE ARTHROSCOPY     right  . KNEE ARTHROSCOPY     left  . TEE WITHOUT CARDIOVERSION N/A 09/16/2015   Procedure: TRANSESOPHAGEAL ECHOCARDIOGRAM (TEE);  Surgeon: Rexene Alberts, MD;  Location: [place];  Service: Open Heart Surgery;  Laterality: N/A;  . TONSILLECTOMY    . VAGINAL HYSTERECTOMY      Current Outpatient Medications  Medication Sig Dispense Refill  . acetaminophen (TYLENOL) 325 MG tablet Take 650 mg by mouth every 6 (six) hours as needed.    . Calcium Carbonate-Vitamin D (CALCIUM 600 + D PO) Take by mouth 2 (two) times daily.    Marland Kitchen ezetimibe (ZETIA) 10 MG tablet Take 10 mg by mouth daily.    . fluticasone (FLONASE) 50 MCG/ACT nasal spray Place 2 sprays into the nose daily as needed.    . metoprolol tartrate (LOPRESSOR) 25 MG tablet TAKE 1 TABLET (25 MG TOTAL) BY MOUTH 2 (TWO) TIMES DAILY. 180 tablet 2  . Multiple Vitamin (MULTIVITAMIN) tablet Take 1 tablet by mouth daily.    . pantoprazole (PROTONIX) 40 MG tablet Take 40 mg by mouth daily as needed.     Marland Kitchen PARoxetine (PAXIL) 20 MG tablet Take 20 mg by mouth daily.    Marland Kitchen warfarin (COUMADIN) 2.5 MG tablet TAKE AS DIRECTED BY COUMADIN CLINIC 60 tablet 3   No current facility-administered medications for this visit.      Allergies:   Codeine; Evista [raloxifene]; Statins; and Sulfa antibiotics   Social History:  The patient  reports that she has never smoked. She has never used smokeless tobacco. She reports that she drinks alcohol. She reports that she does not use drugs.   Family History:   family history includes Breast cancer in her maternal aunt; Heart failure in her mother.    Review of Systems: Review of Systems  Constitutional: Negative.    Respiratory: Negative.   Cardiovascular: Negative.   Gastrointestinal: Negative.   Musculoskeletal: Negative.   Neurological: Negative.        Rare dizziness when standing  Psychiatric/Behavioral: Negative.   All other systems reviewed and are negative.    PHYSICAL EXAM: VS:  There were no vitals taken for this visit. , BMI There is no height or weight on file to calculate BMI. GEN: Well nourished, well developed, in no acute distress  HEENT: normal  Neck: no JVD, carotid bruits, or masses Cardiac: RRR; no murmurs, rubs, or gallops,no edema  Respiratory:  clear to auscultation bilaterally, normal work of breathing GI: soft, nontender, nondistended, + BS MS: no deformity or atrophy  Skin: warm and dry, no rash Neuro:  Strength and sensation are intact Psych: euthymic mood, full affect    Recent Labs: 03/17/2018: BUN 22; Creatinine, Ser 1.02; Hemoglobin 12.0; Platelets 170; Potassium 3.9; Sodium 140    Lipid Panel Lab Results  Component Value Date   CHOL 202 (H) 11/17/2013   HDL 41 11/17/2013   LDLCALC 129 (H) 11/17/2013   TRIG 159 11/17/2013      Wt Readings from Last 3 Encounters:  03/17/18 172 lb (78 kg)  06/25/17 177 lb 4 oz (  80.4 kg)  04/16/17 165 lb (74.8 kg)       ASSESSMENT AND PLAN:  SVT (supraventricular tachycardia) ([place]) - Plan: EKG 12-Lead Prior history of SVT. She denies any recent episodes No further workup needed  Paroxysmal atrial fibrillation ([place]) - Plan: EKG 12-Lead No recent documentation of atrial fibrillation apart from postoperatively Tolerating warfarin  Syncope, unspecified syncope type - Plan: EKG 12-Lead No symptoms recently Previous symptoms concerning for vasovagal event in the setting of abdominal pain, bowel urgency She had syncope with defecation Prior episode of syncope 2013, details unclear She declined workup with event monitor at the time. Potential for atrial fibrillation and possible conversion pauses discussed with  her  Severe aortic valve stenosis S/P aortic valve replacement with bioprosthetic valve  recently seen by Dr. Roxy Manns, well-functioning bioprosthetic aortic valve last echocardiogram December 2016 Repeat echocardiogram ordered for December 2018   Total encounter time more than 25 minutes  Greater than 50% was spent in counseling and coordination of care with the patient  Disposition:   F/U  12 months   No orders of the defined types were placed in this encounter.    Signed, Esmond Plants, M.D., Ph.D. 06/29/2018  [place], [place]

## 2018-06-30 ENCOUNTER — Ambulatory Visit: Payer: PPO | Admitting: Cardiovascular Disease

## 2018-06-30 DIAGNOSIS — E7800 Pure hypercholesterolemia, unspecified: Secondary | ICD-10-CM | POA: Diagnosis not present

## 2018-06-30 DIAGNOSIS — F419 Anxiety disorder, unspecified: Secondary | ICD-10-CM | POA: Diagnosis not present

## 2018-06-30 DIAGNOSIS — J302 Other seasonal allergic rhinitis: Secondary | ICD-10-CM | POA: Diagnosis not present

## 2018-06-30 DIAGNOSIS — I10 Essential (primary) hypertension: Secondary | ICD-10-CM | POA: Diagnosis not present

## 2018-06-30 DIAGNOSIS — K219 Gastro-esophageal reflux disease without esophagitis: Secondary | ICD-10-CM | POA: Diagnosis not present

## 2018-06-30 DIAGNOSIS — Z79899 Other long term (current) drug therapy: Secondary | ICD-10-CM | POA: Diagnosis not present

## 2018-06-30 NOTE — Progress Notes (Signed)
Cardiology Office Note  Date:  07/01/2018   ID:  Kayla Gray, DOB 1936/03/04, MRN [id number]  PCP:  Derinda Late, MD   Chief Complaint  Patient presents with  . other    12 mo follow up. Medications verbally reviewed.    HPI:  Kayla Gray is a very pleasant 81 year old woman with history of  severe aortic valve stenosis, status post AVR with bioprosthetic valve 09/16/2015  postoperative atrial fibrillation, started on amiodarone, warfarin syncope (stumbled backwards, fell back hit head, 2013),  hyperlipidemia,  iron deficiency anemia, peptic ulcer disease Status post left total knee replacement 11/15/2011 SVT in February 2013 admitted   elevated troponin felt secondary to demand ischemia from tachycardia.  February 2015 with TIA-type symptoms, 30 day monitor did not show significant arrhythmia Cardiac cath 08/2015: no CAD She presents today for  Syncope, SVT and bioprosthetic aortic valve  Allergies, joint issue Neck disk problem,   Dizzy, in afternoon, "Walking not good"  Fall x 2 this summer  Total chol 198, LDL109  Denies any tachycardia concerning for atrial fibrillation No Tia or stroke symptoms Tolerating warfarin  Total chol 182, LDL 98 Unable to tolerate statins, she is tolerating Zetia  EKG personally reviewed by myself on todays visit Shows normal sinus rhythm rate 74 bpm no significant ST or T-wave changes  Other past medical history reviewed  October 20th, 2017  syncopal episode , out having supper at [employer]. Went to bathroom. Had abdominal pain, urgency, felt that she had to have a bowel movement.   lightheadedness, everything went white prior to having her bowel movement. Found herself on the floor, had released her bowels  emerg[facility]   brain CT scan was negative. Seen by neurology, had EEG performed   MRI  12/7  Did not participated in cardiac rehabilitation    postoperative atrial fibrillation, started on  amiodarone, warfarin not had any problems with her anticoagulation  Echocardiogram after the surgery showing well-seated aortic valve replacement  Previous echocardiogram showing Peak velocity 510 cm/s, mean gradient 54 mmHg, peak gradient 104 mmHg.  She had knee replacement surgery early 2016  Carotid ultrasound August 2012 showed no significant plaque  echocardiogram 01/ 8 2014 showed normal LV function, aortic valve velocity 315 cm/s, peak gradient 56 mm of mercury, mean gradient 19 mm of mercury, estimated aortic valve area 0.61 cm Echocardiogram in late 2014 showing velocity of 400   Stress test August 2012 showed normal EKG with exertion, normal stress echo  Lab work may 2015 showing total cholesterol 200, LDL 111   PMH:   has a past medical history of Aortic stenosis, Bilateral cataracts, Bleeding nose, Breast cancer ([place]) (1999), Chronic diastolic (congestive) heart failure ([place]), Depression, Diverticulosis, Gastroesophageal reflux disease, Heart murmur, Hyperlipidemia, Hypertension, Iron deficiency anemia, Obstructive sleep apnea, Osteoarthritis, Osteopenia, Peptic ulcer disease, S/P aortic valve replacement with bioprosthetic valve (09/16/2015), Stroke ([place]), Supraventricular tachycardia ([place]), Syncope and collapse, and Urinary tract infection.  PSH:    Past Surgical History:  Procedure Laterality Date  . AORTIC VALVE REPLACEMENT N/A 09/16/2015   Procedure: AORTIC VALVE REPLACEMENT (AVR);  Surgeon: Rexene Alberts, MD;  Location: [place];  Service: Open Heart Surgery;  Laterality: N/A;  . BREAST BIOPSY Right 1999   breast ca radation  . BREAST EXCISIONAL BIOPSY Left yrs ago    benign  . BREAST LUMPECTOMY Right 1999   with radiation  . CARDIAC CATHETERIZATION N/A 08/17/2015   Procedure: Left Heart Cath and Coronary Angiography;  Surgeon: Minna Merritts, MD;  Location: [place] CV LAB;  Service: Cardiovascular;  Laterality: N/A;  . CATARACT EXTRACTION, BILATERAL     . CHOLECYSTECTOMY    . JOINT REPLACEMENT Bilateral    knee replacement  . KNEE ARTHROSCOPY     right  . KNEE ARTHROSCOPY     left  . TEE WITHOUT CARDIOVERSION N/A 09/16/2015   Procedure: TRANSESOPHAGEAL ECHOCARDIOGRAM (TEE);  Surgeon: Rexene Alberts, MD;  Location: [place];  Service: Open Heart Surgery;  Laterality: N/A;  . TONSILLECTOMY    . VAGINAL HYSTERECTOMY      Current Outpatient Medications  Medication Sig Dispense Refill  . acetaminophen (TYLENOL) 325 MG tablet Take 650 mg by mouth every 6 (six) hours as needed.    . Calcium Carbonate-Vitamin D (CALCIUM 600 + D PO) Take by mouth 2 (two) times daily.    Marland Kitchen ezetimibe (ZETIA) 10 MG tablet Take 10 mg by mouth daily.    . fluticasone (FLONASE) 50 MCG/ACT nasal spray Place 2 sprays into the nose daily as needed.    . metoprolol tartrate (LOPRESSOR) 25 MG tablet TAKE 1 TABLET (25 MG TOTAL) BY MOUTH 2 (TWO) TIMES DAILY. 180 tablet 2  . Multiple Vitamin (MULTIVITAMIN) tablet Take 1 tablet by mouth daily.    . pantoprazole (PROTONIX) 40 MG tablet Take 40 mg by mouth daily as needed.     Marland Kitchen PARoxetine (PAXIL) 20 MG tablet Take 20 mg by mouth daily.    Marland Kitchen warfarin (COUMADIN) 2.5 MG tablet TAKE AS DIRECTED BY COUMADIN CLINIC 60 tablet 3   No current facility-administered medications for this visit.      Allergies:   Codeine; Evista [raloxifene]; Statins; and Sulfa antibiotics   Social History:  The patient  reports that she has never smoked. She has never used smokeless tobacco. She reports that she drinks alcohol. She reports that she does not use drugs.   Family History:   family history includes Breast cancer in her maternal aunt; Heart failure in her mother.    Review of Systems: Review of Systems  Constitutional: Negative.   Respiratory: Negative.   Cardiovascular: Negative.   Gastrointestinal: Negative.   Musculoskeletal: Negative.   Neurological: Negative.   Psychiatric/Behavioral: Negative.   All other systems reviewed  and are negative.    PHYSICAL EXAM: VS:  BP 122/78 (BP Location: Left Arm, Patient Position: Sitting, Cuff Size: Normal)   Pulse 74   Ht 5\' 2"  (1.575 m)   Wt 171 lb (77.6 kg)   BMI 31.28 kg/m  , BMI Body mass index is 31.28 kg/m. GEN: Well nourished, well developed, in no acute distress  HEENT: normal  Neck: no JVD, carotid bruits, or masses Cardiac: RRR; no murmurs, rubs, or gallops,no edema  Respiratory:  clear to auscultation bilaterally, normal work of breathing GI: soft, nontender, nondistended, + BS MS: no deformity or atrophy  Skin: warm and dry, no rash Neuro:  Strength and sensation are intact Psych: euthymic mood, full affect    Recent Labs: 03/17/2018: BUN 22; Creatinine, Ser 1.02; Hemoglobin 12.0; Platelets 170; Potassium 3.9; Sodium 140    Lipid Panel Lab Results  Component Value Date   CHOL 202 (H) 11/17/2013   HDL 41 11/17/2013   LDLCALC 129 (H) 11/17/2013   TRIG 159 11/17/2013      Wt Readings from Last 3 Encounters:  07/01/18 171 lb (77.6 kg)  03/17/18 172 lb (78 kg)  06/25/17 177 lb 4 oz (80.4 kg)  ASSESSMENT AND PLAN:  SVT (supraventricular tachycardia) ([place]) - Plan: EKG 12-Lead Prior history of SVT.  No further workup needed No further symptoms  Paroxysmal atrial fibrillation ([facility]) - Plan: EKG 12-Lead No recent documentation of atrial fibrillation apart from postoperatively We will stop her warfarin Start aspirin 81 mg daily  Syncope, unspecified syncope type - Plan: EKG 12-Lead Previous symptoms concerning for vasovagal event in the setting of abdominal pain, bowel urgency She had syncope with defecation No further workup at this time  Severe aortic valve stenosis S/P aortic valve replacement with bioprosthetic valve well-functioning bioprosthetic aortic valve  No further workup needed On aspirin 81 daily    Total encounter time more than 25 minutes  Greater than 50% was spent in counseling and coordination of care with  the patient  Disposition:   F/U  12 months   Orders Placed This Encounter  Procedures  . EKG 12-Lead     Signed, Esmond Plants, M.D., Ph.D. 07/01/2018  [place], [place]

## 2018-07-01 ENCOUNTER — Encounter: Payer: Self-pay | Admitting: Cardiovascular Disease

## 2018-07-01 ENCOUNTER — Ambulatory Visit: Payer: PPO | Admitting: Cardiovascular Disease

## 2018-07-01 VITALS — BP 122/78 | HR 74 | Ht 62.0 in | Wt 171.0 lb

## 2018-07-01 DIAGNOSIS — Z953 Presence of xenogenic heart valve: Secondary | ICD-10-CM | POA: Diagnosis not present

## 2018-07-01 DIAGNOSIS — I471 Supraventricular tachycardia, unspecified: Secondary | ICD-10-CM

## 2018-07-01 DIAGNOSIS — I060 Rheumatic aortic stenosis: Secondary | ICD-10-CM | POA: Diagnosis not present

## 2018-07-01 DIAGNOSIS — I6310 Cerebral infarction due to embolism of unspecified precerebral artery: Secondary | ICD-10-CM

## 2018-07-01 DIAGNOSIS — I480 Paroxysmal atrial fibrillation: Secondary | ICD-10-CM | POA: Diagnosis not present

## 2018-07-01 DIAGNOSIS — I5032 Chronic diastolic (congestive) heart failure: Secondary | ICD-10-CM

## 2018-07-01 MED ORDER — ASPIRIN EC 81 MG PO TBEC: 81.0000 mg | DELAYED_RELEASE_TABLET | Freq: Every day | ORAL | 3 refills | Status: DC

## 2018-07-01 NOTE — Patient Instructions (Addendum)
Medication Instructions:  Your physician has recommended you make the following change in your medication:  1. STOP Warfarin  2. START Aspirin 81 mg once daily   Labwork:  No new labs needed  Testing/Procedures:  No further testing at this time   Follow-Up: It was a pleasure seeing you in the office today. Please call us if you have new issues that need to be addressed before your next appt.  [phone]  Your physician wants you to follow-up in: 12 months.  You will receive a reminder letter in the mail two months in advance. If you don't receive a letter, please call our office to schedule the follow-up appointment.  If you need a refill on your cardiac medications before your next appointment, please call your pharmacy.  For educational health videos Log in to : [email or web address] Or : [email or web address], password : [email or web address]

## 2018-07-10 DIAGNOSIS — X32XXXA Exposure to sunlight, initial encounter: Secondary | ICD-10-CM | POA: Diagnosis not present

## 2018-07-10 DIAGNOSIS — L570 Actinic keratosis: Secondary | ICD-10-CM | POA: Diagnosis not present

## 2018-07-11 DIAGNOSIS — R300 Dysuria: Secondary | ICD-10-CM | POA: Diagnosis not present

## 2018-07-11 DIAGNOSIS — N390 Urinary tract infection, site not specified: Secondary | ICD-10-CM | POA: Diagnosis not present

## 2018-07-22 ENCOUNTER — Other Ambulatory Visit: Payer: Self-pay | Admitting: Family Medicine

## 2018-07-22 DIAGNOSIS — Z1231 Encounter for screening mammogram for malignant neoplasm of breast: Secondary | ICD-10-CM

## 2018-07-24 DIAGNOSIS — M5136 Other intervertebral disc degeneration, lumbar region: Secondary | ICD-10-CM | POA: Diagnosis not present

## 2018-07-24 DIAGNOSIS — M48062 Spinal stenosis, lumbar region with neurogenic claudication: Secondary | ICD-10-CM | POA: Diagnosis not present

## 2018-07-24 DIAGNOSIS — M5416 Radiculopathy, lumbar region: Secondary | ICD-10-CM | POA: Diagnosis not present

## 2018-08-14 ENCOUNTER — Ambulatory Visit
Admission: RE | Admit: 2018-08-14 | Discharge: 2018-08-14 | Disposition: A | Discharge status: Home / Self Care | Payer: PPO | Source: Ambulatory Visit | Attending: Family Medicine | Admitting: Family Medicine

## 2018-08-14 DIAGNOSIS — Z1231 Encounter for screening mammogram for malignant neoplasm of breast: Secondary | ICD-10-CM | POA: Insufficient documentation

## 2018-08-14 HISTORY — DX: Personal history of irradiation: Z92.3

## 2018-08-15 ENCOUNTER — Other Ambulatory Visit: Payer: Self-pay | Admitting: Cardiovascular Disease

## 2018-08-15 NOTE — Telephone Encounter (Signed)
Refill Request.  

## 2018-08-24 ENCOUNTER — Other Ambulatory Visit: Payer: Self-pay | Admitting: Cardiovascular Disease

## 2018-09-16 DIAGNOSIS — N390 Urinary tract infection, site not specified: Secondary | ICD-10-CM | POA: Diagnosis not present

## 2018-09-16 DIAGNOSIS — R300 Dysuria: Secondary | ICD-10-CM | POA: Diagnosis not present

## 2018-10-15 DIAGNOSIS — C4490 Unspecified malignant neoplasm of skin, unspecified: Secondary | ICD-10-CM

## 2018-10-15 HISTORY — PX: SKIN CANCER EXCISION: SHX779

## 2018-10-15 HISTORY — DX: Unspecified malignant neoplasm of skin, unspecified: C44.90

## 2018-12-23 DIAGNOSIS — E7800 Pure hypercholesterolemia, unspecified: Secondary | ICD-10-CM | POA: Diagnosis not present

## 2018-12-23 DIAGNOSIS — Z79899 Other long term (current) drug therapy: Secondary | ICD-10-CM | POA: Diagnosis not present

## 2018-12-26 ENCOUNTER — Observation Stay
Admission: EM | Admit: 2018-12-26 | Discharge: 2018-12-29 | Disposition: A | Discharge status: Home / Self Care | Payer: PPO | Attending: Internal Medicine | Admitting: Internal Medicine

## 2018-12-26 ENCOUNTER — Emergency Department: Payer: PPO

## 2018-12-26 ENCOUNTER — Other Ambulatory Visit: Payer: Self-pay

## 2018-12-26 DIAGNOSIS — M8580 Other specified disorders of bone density and structure, unspecified site: Secondary | ICD-10-CM | POA: Diagnosis not present

## 2018-12-26 DIAGNOSIS — I110 Hypertensive heart disease with heart failure: Secondary | ICD-10-CM | POA: Diagnosis not present

## 2018-12-26 DIAGNOSIS — Z953 Presence of xenogenic heart valve: Secondary | ICD-10-CM | POA: Diagnosis not present

## 2018-12-26 DIAGNOSIS — F419 Anxiety disorder, unspecified: Secondary | ICD-10-CM | POA: Diagnosis not present

## 2018-12-26 DIAGNOSIS — Z882 Allergy status to sulfonamides status: Secondary | ICD-10-CM | POA: Insufficient documentation

## 2018-12-26 DIAGNOSIS — W19XXXA Unspecified fall, initial encounter: Secondary | ICD-10-CM | POA: Insufficient documentation

## 2018-12-26 DIAGNOSIS — T148XXA Other injury of unspecified body region, initial encounter: Secondary | ICD-10-CM | POA: Diagnosis present

## 2018-12-26 DIAGNOSIS — R42 Dizziness and giddiness: Secondary | ICD-10-CM | POA: Insufficient documentation

## 2018-12-26 DIAGNOSIS — Z8673 Personal history of transient ischemic attack (TIA), and cerebral infarction without residual deficits: Secondary | ICD-10-CM | POA: Insufficient documentation

## 2018-12-26 DIAGNOSIS — F329 Major depressive disorder, single episode, unspecified: Secondary | ICD-10-CM | POA: Insufficient documentation

## 2018-12-26 DIAGNOSIS — Z79899 Other long term (current) drug therapy: Secondary | ICD-10-CM | POA: Diagnosis not present

## 2018-12-26 DIAGNOSIS — E785 Hyperlipidemia, unspecified: Secondary | ICD-10-CM | POA: Diagnosis not present

## 2018-12-26 DIAGNOSIS — Z885 Allergy status to narcotic agent status: Secondary | ICD-10-CM | POA: Insufficient documentation

## 2018-12-26 DIAGNOSIS — I5032 Chronic diastolic (congestive) heart failure: Secondary | ICD-10-CM | POA: Insufficient documentation

## 2018-12-26 DIAGNOSIS — K219 Gastro-esophageal reflux disease without esophagitis: Secondary | ICD-10-CM | POA: Diagnosis not present

## 2018-12-26 DIAGNOSIS — G4733 Obstructive sleep apnea (adult) (pediatric): Secondary | ICD-10-CM | POA: Insufficient documentation

## 2018-12-26 DIAGNOSIS — Z803 Family history of malignant neoplasm of breast: Secondary | ICD-10-CM | POA: Insufficient documentation

## 2018-12-26 DIAGNOSIS — R51 Headache: Secondary | ICD-10-CM | POA: Diagnosis not present

## 2018-12-26 DIAGNOSIS — R29898 Other symptoms and signs involving the musculoskeletal system: Secondary | ICD-10-CM | POA: Insufficient documentation

## 2018-12-26 DIAGNOSIS — S0003XA Contusion of scalp, initial encounter: Secondary | ICD-10-CM | POA: Diagnosis not present

## 2018-12-26 DIAGNOSIS — D649 Anemia, unspecified: Secondary | ICD-10-CM | POA: Diagnosis not present

## 2018-12-26 DIAGNOSIS — Z7982 Long term (current) use of aspirin: Secondary | ICD-10-CM | POA: Insufficient documentation

## 2018-12-26 DIAGNOSIS — I10 Essential (primary) hypertension: Secondary | ICD-10-CM | POA: Diagnosis not present

## 2018-12-26 DIAGNOSIS — S0990XA Unspecified injury of head, initial encounter: Secondary | ICD-10-CM

## 2018-12-26 DIAGNOSIS — M7981 Nontraumatic hematoma of soft tissue: Secondary | ICD-10-CM | POA: Diagnosis not present

## 2018-12-26 DIAGNOSIS — Z9181 History of falling: Secondary | ICD-10-CM | POA: Insufficient documentation

## 2018-12-26 DIAGNOSIS — Z853 Personal history of malignant neoplasm of breast: Secondary | ICD-10-CM | POA: Insufficient documentation

## 2018-12-26 DIAGNOSIS — Z8249 Family history of ischemic heart disease and other diseases of the circulatory system: Secondary | ICD-10-CM | POA: Diagnosis not present

## 2018-12-26 DIAGNOSIS — R0902 Hypoxemia: Secondary | ICD-10-CM | POA: Diagnosis not present

## 2018-12-26 DIAGNOSIS — R2681 Unsteadiness on feet: Secondary | ICD-10-CM | POA: Diagnosis not present

## 2018-12-26 DIAGNOSIS — Z23 Encounter for immunization: Secondary | ICD-10-CM | POA: Insufficient documentation

## 2018-12-26 DIAGNOSIS — Z66 Do not resuscitate: Secondary | ICD-10-CM | POA: Diagnosis not present

## 2018-12-26 DIAGNOSIS — Z96653 Presence of artificial knee joint, bilateral: Secondary | ICD-10-CM | POA: Diagnosis not present

## 2018-12-26 DIAGNOSIS — R001 Bradycardia, unspecified: Secondary | ICD-10-CM | POA: Diagnosis not present

## 2018-12-26 DIAGNOSIS — R110 Nausea: Secondary | ICD-10-CM | POA: Diagnosis not present

## 2018-12-26 DIAGNOSIS — Z888 Allergy status to other drugs, medicaments and biological substances status: Secondary | ICD-10-CM | POA: Insufficient documentation

## 2018-12-26 DIAGNOSIS — I6782 Cerebral ischemia: Secondary | ICD-10-CM | POA: Diagnosis not present

## 2018-12-26 DIAGNOSIS — R52 Pain, unspecified: Secondary | ICD-10-CM | POA: Diagnosis not present

## 2018-12-26 LAB — GLUCOSE, CAPILLARY: GLUCOSE-CAPILLARY: 86 mg/dL (ref 70–99)

## 2018-12-26 MED ORDER — ONDANSETRON 4 MG PO TBDP
4.0000 mg | ORAL_TABLET | Freq: Once | ORAL | Status: AC
  Administered 2018-12-26: 4 mg via ORAL
  Filled 2018-12-26: qty 1

## 2018-12-26 MED ORDER — MECLIZINE HCL 25 MG PO TABS
12.5000 mg | ORAL_TABLET | Freq: Once | ORAL | Status: AC
  Administered 2018-12-26: 12.5 mg via ORAL
  Filled 2018-12-26: qty 1

## 2018-12-26 MED ORDER — ACETAMINOPHEN 500 MG PO TABS
1000.0000 mg | ORAL_TABLET | ORAL | Status: AC
  Administered 2018-12-26: 1000 mg via ORAL
  Filled 2018-12-26: qty 2

## 2018-12-26 MED ORDER — MECLIZINE HCL 25 MG PO TABS
12.5000 mg | ORAL_TABLET | ORAL | Status: AC
  Administered 2018-12-26: 12.5 mg via ORAL
  Filled 2018-12-26: qty 1

## 2018-12-26 MED ORDER — LORAZEPAM 0.5 MG PO TABS
0.5000 mg | ORAL_TABLET | Freq: Once | ORAL | Status: AC
  Administered 2018-12-26: 0.5 mg via ORAL
  Filled 2018-12-26: qty 1

## 2018-12-26 NOTE — ED Notes (Signed)
Patient transported to CT 

## 2018-12-26 NOTE — ED Provider Notes (Signed)
[facility] Emergency Department Provider Note ____________________________________________   None    (approximate)  I have reviewed the triage vital signs and the nursing notes.  HISTORY  Chief Complaint Head Injury  HPI Kayla Gray is a 83 y.o. female   here for evaluation after head injury  The patient reports that she was in the ER, she was helping her husband get the lawnmower moving when she pulled on a lever and the lawnmower shot forward causing her to fall backwards striking her head on the pavement.  She did not lose consciousness.  She has had some nausea and vomiting and reports when she is moving her head she starts to feel a spinning feeling.  No chest pain no neck pain.  No numbness tingling or weakness.  She has been in her normal health.  This is never happened before.  She does feel sore and has a headache over the back of the scalp on the right    Past Medical History:  Diagnosis Date  . Aortic stenosis   . Bilateral cataracts   . Bleeding nose    Thurs night (09/08/15) and Fri (09/09/15) left side.Bleeding didn't last long  . Breast cancer ([place]) 1999   right breast lumpectomy and rad tx  . Chronic diastolic (congestive) heart failure ([place])   . Depression   . Diverticulosis   . Gastroesophageal reflux disease   . Heart murmur   . Hyperlipidemia   . Hypertension   . Iron deficiency anemia   . Obstructive sleep apnea   . Osteoarthritis   . Osteopenia   . Peptic ulcer disease   . Personal history of radiation therapy   . S/P aortic valve replacement with bioprosthetic valve 09/16/2015   23 mm Edwards Intuity Elite bioprosthetic tissue valve  . Stroke ([place])    11/14/13  . Supraventricular tachycardia ([place])   . Syncope and collapse   . Urinary tract infection     Patient Active Problem List   Diagnosis Date Noted  . Paroxysmal atrial fibrillation ([place]) 11/14/2015  . Encounter for therapeutic drug monitoring 09/29/2015  .  Chronic diastolic (congestive) heart failure ([place])   . SOB (shortness of breath) 08/17/2015  . Angina pectoris ([place]) 08/17/2015  . Low back pain 08/12/2015  . Sinus tachycardia 01/06/2014  . Osteoarthritis of both knees 01/06/2014  . Aortic valve stenosis 11/27/2012  . SVT (supraventricular tachycardia) ([place]) 11/27/2012  . Syncope 11/27/2012    Past Surgical History:  Procedure Laterality Date  . AORTIC VALVE REPLACEMENT N/A 09/16/2015   Procedure: AORTIC VALVE REPLACEMENT (AVR);  Surgeon: Rexene Alberts, MD;  Location: [place];  Service: Open Heart Surgery;  Laterality: N/A;  . BREAST BIOPSY Right 1999   breast ca radation  . BREAST EXCISIONAL BIOPSY Left yrs ago    benign  . BREAST LUMPECTOMY Right 1999   with radiation  . CARDIAC CATHETERIZATION N/A 08/17/2015   Procedure: Left Heart Cath and Coronary Angiography;  Surgeon: Minna Merritts, MD;  Location: [facility];  Service: Cardiovascular;  Laterality: N/A;  . CATARACT EXTRACTION, BILATERAL    . CHOLECYSTECTOMY    . JOINT REPLACEMENT Bilateral    knee replacement  . KNEE ARTHROSCOPY     right  . KNEE ARTHROSCOPY     left  . TEE WITHOUT CARDIOVERSION N/A 09/16/2015   Procedure: TRANSESOPHAGEAL ECHOCARDIOGRAM (TEE);  Surgeon: Rexene Alberts, MD;  Location: [place];  Service: Open Heart Surgery;  Laterality: N/A;  .  TONSILLECTOMY    . VAGINAL HYSTERECTOMY      Prior to Admission medications   Medication Sig Start Date End Date Taking? Authorizing Provider  acetaminophen (TYLENOL) 325 MG tablet Take 650 mg by mouth every 6 (six) hours as needed.    [provider]  aspirin EC 81 MG tablet Take 1 tablet (81 mg total) by mouth daily. 07/01/18   Minna Merritts, MD  Calcium Carbonate-Vitamin D (CALCIUM 600 + D PO) Take by mouth 2 (two) times daily.    [provider]  ezetimibe (ZETIA) 10 MG tablet Take 10 mg by mouth daily.    [provider]  fluticasone (FLONASE) 50 MCG/ACT nasal spray  Place 2 sprays into the nose daily as needed.    [provider]  metoprolol tartrate (LOPRESSOR) 25 MG tablet TAKE 1 TABLET (25 MG TOTAL) BY MOUTH 2 (TWO) TIMES DAILY. 08/25/18   Minna Merritts, MD  Multiple Vitamin (MULTIVITAMIN) tablet Take 1 tablet by mouth daily.    [provider]  pantoprazole (PROTONIX) 40 MG tablet Take 40 mg by mouth daily as needed.  07/04/14   [provider]  PARoxetine (PAXIL) 20 MG tablet Take 20 mg by mouth daily.    [provider]    Allergies Codeine; Evista [raloxifene]; Statins; and Sulfa antibiotics  Family History  Problem Relation Age of Onset  . Heart failure Mother   . Breast cancer Maternal Aunt     Social History Social History   Tobacco Use  . Smoking status: Never Smoker  . Smokeless tobacco: Never Used  Substance Use Topics  . Alcohol use: Yes    Comment: wine  . Drug use: No    Review of Systems Constitutional: No fever/chills Eyes: No visual changes.  No hearing changes but very hard of hearing all the time.  No bleeding from the ears. ENT:  No neck pain. Cardiovascular: Denies chest pain. Respiratory: Denies shortness of breath. Gastrointestinal: No abdominal pain.   Musculoskeletal: Negative for back pain. Skin: Negative for rash. Neurological: Negative for areas of focal weakness or numbness.    ____________________________________________   PHYSICAL EXAM:  VITAL SIGNS: ED Triage Vitals  Enc Vitals Group     BP 12/26/18 1824 (!) 146/78     Pulse Rate 12/26/18 1824 (!) 58     Resp 12/26/18 1824 18     Temp 12/26/18 1824 98.2 F (36.8 C)     Temp Source 12/26/18 1824 Oral     SpO2 12/26/18 1824 96 %     Weight 12/26/18 1825 170 lb (77.1 kg)     Height 12/26/18 1825 5\' 2"  (1.575 m)     Head Circumference --      Peak Flow --      Pain Score 12/26/18 1824 8     Pain Loc --      Pain Edu? --      Excl. in [place]? --      Constitutional: Alert and oriented. Well appearing  and in no acute distress.  She and her daughter and husband all very pleasant. Eyes: Conjunctivae are normal. Head: Atraumatic except for a mild palpable hematoma over the right posterior scalp. Nose: No congestion/rhinnorhea. Mouth/Throat: Mucous membranes are moist.  No cervical thoracic or lumbar tenderness.  Full range of motion of the neck without pain but reports she starts to feel dizzy when she turns her head side to side.  It gets better as she holds her head still.  Neck: No stridor.  Cardiovascular: Normal rate, regular rhythm. Grossly normal heart sounds.  Good peripheral circulation. Respiratory: Normal respiratory effort.  No retractions. Lungs CTAB. Gastrointestinal: Soft and nontender. No distention. Musculoskeletal: No lower extremity tenderness nor edema. Neurologic:  Normal speech and language. No gross focal neurologic deficits are appreciated.  Cranial nerve exam is normal. Skin:  Skin is warm, dry and intact. No rash noted. Psychiatric: Mood and affect are normal. Speech and behavior are normal.  ____________________________________________   LABS (all labs ordered are listed, but only abnormal results are displayed)  Labs Reviewed  GLUCOSE, CAPILLARY  CBC  BASIC METABOLIC PANEL  CBG MONITORING, ED   ____________________________________________  EKG  Reviewed by me at 0015 Heart rate 60 PR 150 QRS 100 QTc 480 Slight artifact, normal sinus rhythm without evidence of acute ischemia.  There are evidence of Q waves in lead III and V1 through V2.  No acute ST elevation ____________________________________________  RADIOLOGY  Ct Head Wo Contrast  Result Date: 12/26/2018 CLINICAL DATA:  Pedestrian struck by lawnmower. No loss of consciousness. Headache and nausea and vomiting. History of breast cancer, hypertension, hyperlipidemia and stroke. EXAM: CT HEAD WITHOUT CONTRAST TECHNIQUE: Contiguous axial images were obtained from the base of the skull through the  vertex without intravenous contrast. COMPARISON:  MRI head October 11, 2019 FINDINGS: BRAIN: No intraparenchymal hemorrhage, mass effect nor midline shift. No parenchymal brain volume loss for age. No hydrocephalus. Patchy supratentorial white matter hypodensities within normal range for patient's age, though non-specific are most compatible with chronic small vessel ischemic disease. Punctate extra-axial calcifications. No acute large vascular territory infarcts. No abnormal extra-axial fluid collections. Basal cisterns are patent. VASCULAR: Mild calcific atherosclerosis of the carotid siphons. SKULL: No skull fracture.  Moderate parietal vertex scalp hematoma. SINUSES/ORBITS: Trace paranasal sinus mucosal thickening. Mastoid air cells are well aerated.The included ocular globes and orbital contents are non-suspicious. OTHER: None. IMPRESSION: 1. No acute intracranial process.  Moderate scalp hematoma. 2. Otherwise negative non-contrast CT HEAD for age. Electronically Signed   By: Elon Alas M.D.   On: 12/26/2018 18:47   CT reviewed negative for intracranial hemorrhage.  Scalp hematoma present.  Reviewed by me. ____________________________________________   PROCEDURES  Procedure(s) performed: None  Procedures  Critical Care performed: No  ____________________________________________   INITIAL IMPRESSION / ASSESSMENT AND PLAN / ED COURSE  Pertinent labs & imaging results that were available during my care of the patient were reviewed by me and considered in my medical decision making (see chart for details).   Patient has a mechanical fall causing her to fall striking the back of her head.  No loss consciousness.  Having a mild to moderate posterior headache but has developed symptoms of vertigo.  She has no neurologic symptoms to suggest intracranial hemorrhage.  Her CT scan is reassuring.  She takes no anticoagulant.  Denies any preceding symptoms and clearly reports that the fall was  due to a mechanical issue.  She was not struck or run over by a lawnmower but lost balance and fell back striking her head.  She has however developed vertigo-like symptoms.  I suspect she may have developed problem with an otolith secondary to the fall.  She has had some nausea, dizziness described as a spinning with head movements.  Nexus negative.  Will give Zofran, Antivert, and Tylenol and plan to reassess.    ----------------------------------------- 11:39 PM on 12/26/2018 -----------------------------------------  Despite multiple medications for vertigo, the patient still requires assistance of 2 people  to get up.  She is not stable in her gait.  Family reports she has some gait difficulty at baseline, but it seems to be much worse now and she develops vertigo very easily with any movement.  She does not have care at home that would be able to assist her, and at this point given her persistent vertigo I think further work-up becomes warranted though it seems that it was clearly secondary to her head injury.  She is fully alert, in no distress moves all extremities well.  Movement of the head however definitely increases and causes vertigo-like symptoms  Discussed with the patient, we will obtain basic labs, EKG and have ordered an MRI of the brain to exclude cerebellar process or central process given the persistence of the symptoms at this time hours clinical presentation seems to suggest likely peripheral vertigo induced by head trauma and not a central cause such as stroke.  Patient agreeable with along with family for plan for admission given her inability to walk secondary to severe vertigo symptoms with movement  Ongoing labs, MRI follow-up assigned to Dr. Archie Balboa.  Case and care discussed with the hospitalist for admission, Dr. Marcille Blanco  ____________________________________________   FINAL CLINICAL IMPRESSION(S) / ED DIAGNOSES  Final diagnoses:  Vertigo  Closed head injury,  initial encounter  Hematoma        Note:  This document was prepared using Dragon voice recognition software and may include unintentional dictation errors       Delman Kitten, MD 12/27/18 0020

## 2018-12-26 NOTE — ED Triage Notes (Signed)
PT to ED via EMS from home. PT husband accidentally hit her with the lawn mower and knocked her down. PT denies LOC, takes 81mg  daily aspirin. N/V since incident. 4 mg zofran enroute. PT only complain is headache at this time.

## 2018-12-26 NOTE — ED Notes (Signed)
Attempted to ambulate pt, pt got feet to floor and stated she was too dizzy to get up. MD aware

## 2018-12-26 NOTE — ED Notes (Signed)
Pt up to restroom with 2 assist, unsteady on feet.

## 2018-12-27 ENCOUNTER — Other Ambulatory Visit: Payer: Self-pay

## 2018-12-27 ENCOUNTER — Observation Stay: Payer: PPO

## 2018-12-27 ENCOUNTER — Emergency Department: Payer: PPO

## 2018-12-27 DIAGNOSIS — R2681 Unsteadiness on feet: Secondary | ICD-10-CM | POA: Diagnosis present

## 2018-12-27 DIAGNOSIS — F0781 Postconcussional syndrome: Secondary | ICD-10-CM | POA: Diagnosis not present

## 2018-12-27 DIAGNOSIS — I6523 Occlusion and stenosis of bilateral carotid arteries: Secondary | ICD-10-CM | POA: Diagnosis not present

## 2018-12-27 DIAGNOSIS — I6782 Cerebral ischemia: Secondary | ICD-10-CM | POA: Diagnosis not present

## 2018-12-27 DIAGNOSIS — R42 Dizziness and giddiness: Secondary | ICD-10-CM

## 2018-12-27 DIAGNOSIS — F419 Anxiety disorder, unspecified: Secondary | ICD-10-CM | POA: Diagnosis not present

## 2018-12-27 DIAGNOSIS — R270 Ataxia, unspecified: Secondary | ICD-10-CM | POA: Diagnosis not present

## 2018-12-27 DIAGNOSIS — S0990XA Unspecified injury of head, initial encounter: Secondary | ICD-10-CM | POA: Diagnosis not present

## 2018-12-27 LAB — BASIC METABOLIC PANEL
Anion gap: 7 (ref 5–15)
Anion gap: 7 (ref 5–15)
BUN: 17 mg/dL (ref 8–23)
BUN: 19 mg/dL (ref 8–23)
CALCIUM: 8.9 mg/dL (ref 8.9–10.3)
CO2: 27 mmol/L (ref 22–32)
CO2: 28 mmol/L (ref 22–32)
CREATININE: 0.89 mg/dL (ref 0.44–1.00)
Calcium: 9.1 mg/dL (ref 8.9–10.3)
Chloride: 105 mmol/L (ref 98–111)
Chloride: 107 mmol/L (ref 98–111)
Creatinine, Ser: 0.96 mg/dL (ref 0.44–1.00)
GFR calc Af Amer: >60 mL/min (ref >60)
GFR calc non Af Amer: 56 mL/min — ABNORMAL LOW (ref >60)
GFR calc non Af Amer: >60 mL/min (ref >60)
GLUCOSE: 107 mg/dL — AB (ref 70–99)
Glucose, Bld: 99 mg/dL (ref 70–99)
Potassium: 4 mmol/L (ref 3.5–5.1)
Potassium: 4.2 mmol/L (ref 3.5–5.1)
Sodium: 139 mmol/L (ref 135–145)
Sodium: 142 mmol/L (ref 135–145)

## 2018-12-27 LAB — CBC
HCT: 35.2 % — ABNORMAL LOW (ref 36.0–46.0)
HEMATOCRIT: 34.5 % — AB (ref 36.0–46.0)
HEMOGLOBIN: 11.2 g/dL — AB (ref 12.0–15.0)
Hemoglobin: 10.9 g/dL — ABNORMAL LOW (ref 12.0–15.0)
MCH: 27.9 pg (ref 26.0–34.0)
MCH: 27.9 pg (ref 26.0–34.0)
MCHC: 31.6 g/dL (ref 30.0–36.0)
MCHC: 31.8 g/dL (ref 30.0–36.0)
MCV: 87.6 fL (ref 80.0–100.0)
MCV: 88.2 fL (ref 80.0–100.0)
Platelets: 127 10*3/uL — ABNORMAL LOW (ref 150–400)
Platelets: 138 10*3/uL — ABNORMAL LOW (ref 150–400)
RBC: 3.91 MIL/uL (ref 3.87–5.11)
RBC: 4.02 MIL/uL (ref 3.87–5.11)
RDW: 13.6 % (ref 11.5–15.5)
RDW: 13.7 % (ref 11.5–15.5)
WBC: 5.6 10*3/uL (ref 4.0–10.5)
WBC: 8 10*3/uL (ref 4.0–10.5)
nRBC: 0 % (ref 0.0–0.2)
nRBC: 0 % (ref 0.0–0.2)

## 2018-12-27 MED ORDER — ACETAMINOPHEN 650 MG RE SUPP: 650.0000 mg | Freq: Four times a day (QID) | RECTAL | Status: DC | PRN

## 2018-12-27 MED ORDER — TRAZODONE HCL 50 MG PO TABS
25.0000 mg | ORAL_TABLET | Freq: Every evening | ORAL | Status: DC | PRN
  Administered 2018-12-28: 25 mg via ORAL
  Filled 2018-12-27: qty 1

## 2018-12-27 MED ORDER — ASPIRIN EC 81 MG PO TBEC
81.0000 mg | DELAYED_RELEASE_TABLET | Freq: Every day | ORAL | Status: DC
  Administered 2018-12-27 – 2018-12-29 (×3): 81 mg via ORAL
  Filled 2018-12-27 (×3): qty 1

## 2018-12-27 MED ORDER — EZETIMIBE 10 MG PO TABS
10.0000 mg | ORAL_TABLET | Freq: Every day | ORAL | Status: DC
  Administered 2018-12-27 – 2018-12-28 (×3): 10 mg via ORAL
  Filled 2018-12-27 (×4): qty 1

## 2018-12-27 MED ORDER — FLUTICASONE PROPIONATE 50 MCG/ACT NA SUSP
2.0000 | Freq: Every day | NASAL | Status: DC
  Administered 2018-12-28 – 2018-12-29 (×2): 2 via NASAL
  Filled 2018-12-27: qty 16

## 2018-12-27 MED ORDER — EZETIMIBE 10 MG PO TABS: 10.0000 mg | ORAL_TABLET | Freq: Every day | ORAL | Status: DC

## 2018-12-27 MED ORDER — PNEUMOCOCCAL VAC POLYVALENT 25 MCG/0.5ML IJ INJ
0.5000 mL | INJECTION | INTRAMUSCULAR | Status: AC
  Administered 2018-12-28: 08:00:00 0.5 mL via INTRAMUSCULAR
  Filled 2018-12-27: qty 0.5

## 2018-12-27 MED ORDER — KETOROLAC TROMETHAMINE 30 MG/ML IJ SOLN
15.0000 mg | Freq: Four times a day (QID) | INTRAMUSCULAR | Status: DC | PRN
  Administered 2018-12-27 – 2018-12-29 (×3): 15 mg via INTRAVENOUS
  Filled 2018-12-27 (×3): qty 1

## 2018-12-27 MED ORDER — METOPROLOL TARTRATE 25 MG PO TABS
25.0000 mg | ORAL_TABLET | Freq: Two times a day (BID) | ORAL | Status: DC
  Administered 2018-12-27 – 2018-12-29 (×6): 25 mg via ORAL
  Filled 2018-12-27 (×6): qty 1

## 2018-12-27 MED ORDER — PANTOPRAZOLE SODIUM 40 MG PO TBEC
40.0000 mg | DELAYED_RELEASE_TABLET | Freq: Every day | ORAL | Status: DC
  Administered 2018-12-27 – 2018-12-29 (×3): 40 mg via ORAL
  Filled 2018-12-27 (×3): qty 1

## 2018-12-27 MED ORDER — PAROXETINE HCL 20 MG PO TABS
20.0000 mg | ORAL_TABLET | Freq: Every day | ORAL | Status: DC
  Administered 2018-12-28 – 2018-12-29 (×2): 20 mg via ORAL
  Filled 2018-12-27 (×4): qty 1

## 2018-12-27 MED ORDER — SODIUM CHLORIDE 0.9 % IV SOLN
INTRAVENOUS | Status: DC
  Administered 2018-12-27 – 2018-12-28 (×3): via INTRAVENOUS

## 2018-12-27 MED ORDER — CALCIUM CARBONATE-VITAMIN D 500-200 MG-UNIT PO TABS
2.0000 | ORAL_TABLET | Freq: Two times a day (BID) | ORAL | Status: DC
  Administered 2018-12-27 – 2018-12-29 (×6): 2 via ORAL
  Filled 2018-12-27 (×7): qty 2

## 2018-12-27 MED ORDER — MECLIZINE HCL 12.5 MG PO TABS
12.5000 mg | ORAL_TABLET | Freq: Three times a day (TID) | ORAL | Status: DC | PRN
  Filled 2018-12-27 (×2): qty 1

## 2018-12-27 MED ORDER — EZETIMIBE 10 MG PO TABS
10.0000 mg | ORAL_TABLET | Freq: Every day | ORAL | Status: DC
  Filled 2018-12-27: qty 1

## 2018-12-27 MED ORDER — ACETAMINOPHEN 325 MG PO TABS
650.0000 mg | ORAL_TABLET | Freq: Four times a day (QID) | ORAL | Status: DC | PRN
  Administered 2018-12-28 (×2): 650 mg via ORAL
  Filled 2018-12-27 (×2): qty 2

## 2018-12-27 MED ORDER — ENOXAPARIN SODIUM 40 MG/0.4ML ~~LOC~~ SOLN
40.0000 mg | SUBCUTANEOUS | Status: DC
  Administered 2018-12-27: 06:00:00 40 mg via SUBCUTANEOUS
  Filled 2018-12-27: qty 0.4

## 2018-12-27 MED ORDER — ADULT MULTIVITAMIN W/MINERALS CH
1.0000 | ORAL_TABLET | Freq: Every day | ORAL | Status: DC
  Administered 2018-12-27 – 2018-12-29 (×3): 1 via ORAL
  Filled 2018-12-27 (×3): qty 1

## 2018-12-27 MED ORDER — MAGNESIUM HYDROXIDE 400 MG/5ML PO SUSP
30.0000 mL | Freq: Every day | ORAL | Status: DC | PRN
  Filled 2018-12-27: qty 30

## 2018-12-27 NOTE — ED Notes (Signed)
Pt returned from MRI °

## 2018-12-27 NOTE — Progress Notes (Signed)
PT Cancellation Note  Patient Details Name: Kayla Gray MRN: [id number] DOB: 07-Jan-1936   Cancelled Treatment:    Reason Eval/Treat Not Completed: Other (comment)(Not in room) PT checked on patient, gone to ultrasound. Will check back as time allows   Shelton Silvas 12/27/2018, 11:53 AM

## 2018-12-27 NOTE — ED Notes (Signed)
Pt sleeping. Family updated on admission process.

## 2018-12-27 NOTE — Progress Notes (Signed)
Family Meeting Note  Advance Directive:yes  Today a meeting took place with the Patient and spouse.  The following clinical team members were present during this meeting:MD  The following were discussed:Patient's diagnosis: Vertigo, Anxiety, Htn, Dyslipidemia , Patient's progosis: Unable to determine and Goals for treatment: DNR  Additional follow-up to be provided: PMD  Time spent during discussion:20 minutes  Vaughan Basta, MD

## 2018-12-27 NOTE — H&P (Signed)
[facility] [place] NAME: Kayla Gray    MR#:  [id number]  DATE OF BIRTH:  07/23/1936  DATE OF ADMISSION:  12/26/2018  PRIMARY CARE PHYSICIAN: Derinda Late, MD   REQUESTING/REFERRING PHYSICIAN: Letha Cape, MD  CHIEF COMPLAINT:   Chief Complaint  Patient presents with  . Head Injury    HISTORY OF PRESENT ILLNESS:  Kayla Gray  is a 83 y.o. female with a known history of multiple medical problems that will be mentioned below who presented to the emergency room with a concern of accidental fall on the back of her head with subsequent head injury.  The patient was trying to help her husband to pull the emergency cord for his riding mower when she lost her balance to run away from the more and fell.  She declined any injuries at that time.  She denied any syncope or presyncope prior or after her fall.  No chest pain or dyspnea or palpitations.  No nausea or vomiting.  No dyspnea or cough or hemoptysis.  She developed vertigo in the emergency room with unsteady gait.  She denied any paresthesias or focal muscle weakness.  Upon presentation to the emergency room, blood pressure was 146/78 with a pulse of 58, respiratory 18 temperature 98.2 with pulse oximetry of 90% on room air.  Labs revealed anemia with hemoglobin of 10.9 hematocrit 34.5.  EKG showed mild sinus bradycardia with a rate of 59 with left axis deviation and Q waves in V1 and poor R wave progression..  Noncontrasted head CT scan revealed no acute intracranial normalities.  Showed posterior scalp contusion and ischemic microvascular disease with old infarcts as mentioned below in details.  The patient was given 1 g of p.o. Tylenol, 0.5 mg of p.o. Ativan and Antivert 12.5 mg p.o. twice as well as Zofran 4 mg p.o.  She will be admitted to an observation medically monitored bed for further evaluation and management. PAST MEDICAL HISTORY:   Past Medical History:  Diagnosis Date  .  Aortic stenosis   . Bilateral cataracts   . Bleeding nose    Thurs night (09/08/15) and Fri (09/09/15) left side.Bleeding didn't last long  . Breast cancer ([place]) 1999   right breast lumpectomy and rad tx  . Chronic diastolic (congestive) heart failure ([place])   . Depression   . Diverticulosis   . Gastroesophageal reflux disease   . Heart murmur   . Hyperlipidemia   . Hypertension   . Iron deficiency anemia   . Obstructive sleep apnea   . Osteoarthritis   . Osteopenia   . Peptic ulcer disease   . Personal history of radiation therapy   . S/P aortic valve replacement with bioprosthetic valve 09/16/2015   23 mm Edwards Intuity Elite bioprosthetic tissue valve  . Stroke ([place])    11/14/13  . Supraventricular tachycardia ([place])   . Syncope and collapse   . Urinary tract infection     PAST SURGICAL HISTORY:   Past Surgical History:  Procedure Laterality Date  . AORTIC VALVE REPLACEMENT N/A 09/16/2015   Procedure: AORTIC VALVE REPLACEMENT (AVR);  Surgeon: Rexene Alberts, MD;  Location: [facility];  Service: Open Heart Surgery;  Laterality: N/A;  . BREAST BIOPSY Right 1999   breast ca radation  . BREAST EXCISIONAL BIOPSY Left yrs ago    benign  . BREAST LUMPECTOMY Right 1999   with radiation  . CARDIAC CATHETERIZATION N/A 08/17/2015   Procedure: Left Heart Cath  and Coronary Angiography;  Surgeon: Minna Merritts, MD;  Location: [facility];  Service: Cardiovascular;  Laterality: N/A;  . CATARACT EXTRACTION, BILATERAL    . CHOLECYSTECTOMY    . JOINT REPLACEMENT Bilateral    knee replacement  . KNEE ARTHROSCOPY     right  . KNEE ARTHROSCOPY     left  . TEE WITHOUT CARDIOVERSION N/A 09/16/2015   Procedure: TRANSESOPHAGEAL ECHOCARDIOGRAM (TEE);  Surgeon: Rexene Alberts, MD;  Location: [facility];  Service: Open Heart Surgery;  Laterality: N/A;  . TONSILLECTOMY    . VAGINAL HYSTERECTOMY      SOCIAL HISTORY:   Social History   Tobacco Use  . Smoking status: Never Smoker  .  Smokeless tobacco: Never Used  Substance Use Topics  . Alcohol use: Yes    Comment: wine    FAMILY HISTORY:   Family History  Problem Relation Age of Onset  . Heart failure Mother   . Breast cancer Maternal Aunt     DRUG ALLERGIES:   Allergies  Allergen Reactions  . Codeine Hives  . Evista [Raloxifene]   . Statins Other (See Comments)    Leg muscle cramps  . Sulfa Antibiotics Hives    REVIEW OF SYSTEMS:   ROS As per history of present illness. All pertinent systems were reviewed above. Constitutional,  HEENT, cardiovascular, respiratory, GI, GU, musculoskeletal, neuro, psychiatric, endocrine,  integumentary and hematologic systems were reviewed and are otherwise  negative/unremarkable except for positive findings mentioned above in the HPI.   MEDICATIONS AT HOME:   Prior to Admission medications   Medication Sig Start Date End Date Taking? Authorizing Provider  acetaminophen (TYLENOL) 325 MG tablet Take 650 mg by mouth every 6 (six) hours as needed.   Yes [provider]  aspirin EC 81 MG tablet Take 1 tablet (81 mg total) by mouth daily. 07/01/18  Yes Gollan, [name] November, MD  Calcium Carbonate-Vitamin D (CALCIUM 600 + D PO) Take by mouth 2 (two) times daily.   Yes [provider]  ezetimibe (ZETIA) 10 MG tablet Take 10 mg by mouth daily.   Yes [provider]  fluticasone (FLONASE) 50 MCG/ACT nasal spray Place 2 sprays into the nose daily as needed.   Yes [provider]  metoprolol tartrate (LOPRESSOR) 25 MG tablet TAKE 1 TABLET (25 MG TOTAL) BY MOUTH 2 (TWO) TIMES DAILY. 08/25/18  Yes Minna Merritts, MD  Multiple Vitamin (MULTIVITAMIN) tablet Take 1 tablet by mouth daily.   Yes [provider]  pantoprazole (PROTONIX) 40 MG tablet Take 40 mg by mouth daily as needed.  07/04/14  Yes [provider]  PARoxetine (PAXIL) 20 MG tablet Take 20 mg by mouth daily.   Yes [provider]      VITAL SIGNS:  Blood  pressure 135/74, pulse 63, temperature 98 F (36.7 C), temperature source Oral, resp. rate 18, height 5\' 2"  (1.575 m), weight 77.1 kg, SpO2 94 %.  PHYSICAL EXAMINATION:  Physical Exam  GENERAL:  83 y.o.-year-old patient lying in the bed with no acute distress.  EYES: Pupils equal, round, reactive to light and accommodation. No scleral icterus. Extraocular muscles intact.  HEENT: Head atraumatic, normocephalic. Oropharynx and nasopharynx clear.  NECK:  Supple, no jugular venous distention. No thyroid enlargement, no tenderness.  LUNGS: Normal breath sounds bilaterally, no wheezing, rales,rhonchi or crepitation. No use of accessory muscles of respiration.  CARDIOVASCULAR: S1, S2 normal. No murmurs, rubs, or gallops.  ABDOMEN: Soft, nontender, nondistended. Bowel  sounds present. No organomegaly or mass.  EXTREMITIES: No pedal edema, cyanosis, or clubbing.  NEUROLOGIC: Cranial nerves II through XII are intact. Muscle strength 5/5 in all extremities. Sensation intact. Gait not checked.  PSYCHIATRIC: The patient is alert and oriented x 3.  SKIN: No obvious rash, lesion, or ulcer.   LABORATORY PANEL:   CBC Recent Labs  Lab 12/27/18 0515  WBC 5.6  HGB 11.2*  HCT 35.2*  PLT 138*   ------------------------------------------------------------------------------------------------------------------  Chemistries  Recent Labs  Lab 12/27/18 0515  NA 142  K 4.0  CL 107  CO2 28  GLUCOSE 99  BUN 17  CREATININE 0.96  CALCIUM 9.1   ------------------------------------------------------------------------------------------------------------------  Cardiac Enzymes No results for input(s): TROPONINI in the last 168 hours. ------------------------------------------------------------------------------------------------------------------  RADIOLOGY:  Ct Head Wo Contrast  Result Date: 12/26/2018 CLINICAL DATA:  Pedestrian struck by lawnmower. No loss of consciousness. Headache and nausea and  vomiting. History of breast cancer, hypertension, hyperlipidemia and stroke. EXAM: CT HEAD WITHOUT CONTRAST TECHNIQUE: Contiguous axial images were obtained from the base of the skull through the vertex without intravenous contrast. COMPARISON:  MRI head October 11, 2019 FINDINGS: BRAIN: No intraparenchymal hemorrhage, mass effect nor midline shift. No parenchymal brain volume loss for age. No hydrocephalus. Patchy supratentorial white matter hypodensities within normal range for patient's age, though non-specific are most compatible with chronic small vessel ischemic disease. Punctate extra-axial calcifications. No acute large vascular territory infarcts. No abnormal extra-axial fluid collections. Basal cisterns are patent. VASCULAR: Mild calcific atherosclerosis of the carotid siphons. SKULL: No skull fracture.  Moderate parietal vertex scalp hematoma. SINUSES/ORBITS: Trace paranasal sinus mucosal thickening. Mastoid air cells are well aerated.The included ocular globes and orbital contents are non-suspicious. OTHER: None. IMPRESSION: 1. No acute intracranial process.  Moderate scalp hematoma. 2. Otherwise negative non-contrast CT HEAD for age. Electronically Signed   By: Elon Alas M.D.   On: 12/26/2018 18:47   Mr Brain Wo Contrast  Result Date: 12/27/2018 CLINICAL DATA:  Initial evaluation for acute vertigo, fall. EXAM: MRI HEAD WITHOUT CONTRAST TECHNIQUE: Multiplanar, multiecho pulse sequences of the brain and surrounding structures were obtained without intravenous contrast. COMPARISON:  Prior CT from 12/26/2018. FINDINGS: Brain: Generalized age-related cerebral atrophy. Patchy and confluent T2/FLAIR hyperintensity within the periventricular deep white matter both cerebral hemispheres, most consistent with chronic microvascular ischemic disease, moderate nature. Few superimposed remote lacunar infarcts noted within the bilateral basal ganglia/corona radiata. Small remote lacunar infarct noted  within the left thalamus. Small chronic bilateral cerebellar infarcts noted as well. No abnormal foci of restricted diffusion to suggest acute or subacute ischemia. No other areas of remote cortical infarction. No evidence for acute intracranial hemorrhage. Few small chronic micro hemorrhages noted within the left thalamus and right paramedian pons, likely related to small vessel disease. No mass lesion, midline shift or mass effect. No hydrocephalus. No extra-axial fluid collection. Pituitary gland within normal limits. Vascular: Right vertebral artery hypoplastic and not well seen. Major intracranial vascular flow voids otherwise maintained. Skull and upper cervical spine: Craniocervical junction normal. Upper cervical spine within normal limits. Bone marrow signal intensity normal. Moderate sized posterior scalp contusion noted. Sinuses/Orbits: Patient status post bilateral ocular lens replacement. Small left maxillary sinus retention cyst. Paranasal sinuses are otherwise clear. Trace right mastoid effusion, of doubtful significance. Other: None. IMPRESSION: 1. No acute intracranial abnormality. 2. Moderate sized posterior scalp contusion. 3. Moderate chronic microvascular ischemic disease with superimposed remote lacunar infarcts within the bilateral basal ganglia/corona radiata and left thalamus, with additional small  remote bilateral cerebellar infarcts. Electronically Signed   By: Jeannine Boga M.D.   On: 12/27/2018 01:35      IMPRESSION AND PLAN:   #1 Mechanical fall with subsequent head injury and posterior scalp hematoma.  The patient will be admitted to an observation medically monitored bed for further monitoring.  We will follow neuro checks every 4 hours for 24 hours.  Pain management will be provided as needed.  2.  Vertigo.  Rule out vertebrobasilar insufficiency. The patient will be admitted to an observation medically monitored bed.  We will follow neuro checks q.4 hours for 24  hours.  The patient will be placed on aspirin.  Will obtain a brain MRI without contrast and bilateral carotid Doppler.  She will be placed on PRN Antivert.  I suspect peripheral etiology though.  3.  Anxiety/depression.  Her Paxil will be continued.  4.  Hypertension.  We will continue her Lopressor.  5.  Dyslipidemia.  Fasting lipids will be obtained and will continue her Zetia  6.  DVT prophylaxis.  She will be placed on SCDs for now we will hold off on Lovenox given her scalp hematoma.     All the records are reviewed and case discussed with ED provider. Management plans discussed with the patient and answered her questions.  She agreed to proceed with the above-mentioned plan.  Further management will depend upon hospital course.   CODE STATUS: Full code   TOTAL TIME TAKING CARE OF THIS PATIENT: 55 minutes.    Christel Mormon M.D on 12/27/2018 at 7:39 AM  Pager - [phone]  After 6pm go to [email or web address] - Proofreader  [facility]  Office  [phone]  CC: Primary care physician; Derinda Late, MD   Note: This dictation was prepared with Dragon dictation along with smaller phrase technology. Any transcriptional errors that result from this process are unintentional.

## 2018-12-27 NOTE — Progress Notes (Signed)
[facility] [place] NAME: Kayla Gray    MR#:  [id number]  DATE OF BIRTH:  1935/10/22  SUBJECTIVE:  CHIEF COMPLAINT:   Chief Complaint  Patient presents with  . Head Injury   Came after a fall and head injury and feeling dizzy. She had 2 falls in 2019.  Continue to feel dizzy currently.  MRI negative. REVIEW OF SYSTEMS:  CONSTITUTIONAL: No fever, have fatigue or weakness.  EYES: No blurred or double vision.  EARS, NOSE, AND THROAT: No tinnitus or ear pain.  RESPIRATORY: No cough, shortness of breath, wheezing or hemoptysis.  CARDIOVASCULAR: No chest pain, orthopnea, edema.  GASTROINTESTINAL: No nausea, vomiting, diarrhea or abdominal pain.  GENITOURINARY: No dysuria, hematuria.  ENDOCRINE: No polyuria, nocturia,  HEMATOLOGY: No anemia, easy bruising or bleeding SKIN: No rash or lesion. MUSCULOSKELETAL: No joint pain or arthritis.   NEUROLOGIC: No tingling, numbness, weakness.  PSYCHIATRY: No anxiety or depression.   ROS  DRUG ALLERGIES:   Allergies  Allergen Reactions  . Codeine Hives  . Evista [Raloxifene]   . Statins Other (See Comments)    Leg muscle cramps  . Sulfa Antibiotics Hives    VITALS:  Blood pressure (!) 125/57, pulse 68, temperature 98.5 F (36.9 C), temperature source Oral, resp. rate 20, height 5\' 2"  (1.575 m), weight 77.1 kg, SpO2 95 %.  PHYSICAL EXAMINATION:  GENERAL:  83 y.o.-year-old patient lying in the bed with no acute distress.  EYES: Pupils equal, round, reactive to light and accommodation. No scleral icterus. Extraocular muscles intact.  HEENT: Head atraumatic, normocephalic. Oropharynx and nasopharynx clear.  NECK:  Supple, no jugular venous distention. No thyroid enlargement, no tenderness.  LUNGS: Normal breath sounds bilaterally, no wheezing, rales,rhonchi or crepitation. No use of accessory muscles of respiration.  CARDIOVASCULAR: S1, S2 normal. No murmurs, rubs, or gallops.  ABDOMEN:  Soft, nontender, nondistended. Bowel sounds present. No organomegaly or mass.  EXTREMITIES: No pedal edema, cyanosis, or clubbing.  NEUROLOGIC: Cranial nerves II through XII are intact. Muscle strength 4/5 in all extremities. Sensation intact. Gait not checked.  PSYCHIATRIC: The patient is alert and oriented x 3.  SKIN: No obvious rash, lesion, or ulcer.   Physical Exam LABORATORY PANEL:   CBC Recent Labs  Lab 12/27/18 0515  WBC 5.6  HGB 11.2*  HCT 35.2*  PLT 138*   ------------------------------------------------------------------------------------------------------------------  Chemistries  Recent Labs  Lab 12/27/18 0515  NA 142  K 4.0  CL 107  CO2 28  GLUCOSE 99  BUN 17  CREATININE 0.96  CALCIUM 9.1   ------------------------------------------------------------------------------------------------------------------  Cardiac Enzymes No results for input(s): TROPONINI in the last 168 hours. ------------------------------------------------------------------------------------------------------------------  RADIOLOGY:  Ct Head Wo Contrast  Result Date: 12/26/2018 CLINICAL DATA:  Pedestrian struck by lawnmower. No loss of consciousness. Headache and nausea and vomiting. History of breast cancer, hypertension, hyperlipidemia and stroke. EXAM: CT HEAD WITHOUT CONTRAST TECHNIQUE: Contiguous axial images were obtained from the base of the skull through the vertex without intravenous contrast. COMPARISON:  MRI head October 11, 2019 FINDINGS: BRAIN: No intraparenchymal hemorrhage, mass effect nor midline shift. No parenchymal brain volume loss for age. No hydrocephalus. Patchy supratentorial white matter hypodensities within normal range for patient's age, though non-specific are most compatible with chronic small vessel ischemic disease. Punctate extra-axial calcifications. No acute large vascular territory infarcts. No abnormal extra-axial fluid collections. Basal cisterns are  patent. VASCULAR: Mild calcific atherosclerosis of the carotid siphons. SKULL: No skull fracture.  Moderate parietal vertex  scalp hematoma. SINUSES/ORBITS: Trace paranasal sinus mucosal thickening. Mastoid air cells are well aerated.The included ocular globes and orbital contents are non-suspicious. OTHER: None. IMPRESSION: 1. No acute intracranial process.  Moderate scalp hematoma. 2. Otherwise negative non-contrast CT HEAD for age. Electronically Signed   By: Elon Alas M.D.   On: 12/26/2018 18:47   Mr Brain Wo Contrast  Result Date: 12/27/2018 CLINICAL DATA:  Initial evaluation for acute vertigo, fall. EXAM: MRI HEAD WITHOUT CONTRAST TECHNIQUE: Multiplanar, multiecho pulse sequences of the brain and surrounding structures were obtained without intravenous contrast. COMPARISON:  Prior CT from 12/26/2018. FINDINGS: Brain: Generalized age-related cerebral atrophy. Patchy and confluent T2/FLAIR hyperintensity within the periventricular deep white matter both cerebral hemispheres, most consistent with chronic microvascular ischemic disease, moderate nature. Few superimposed remote lacunar infarcts noted within the bilateral basal ganglia/corona radiata. Small remote lacunar infarct noted within the left thalamus. Small chronic bilateral cerebellar infarcts noted as well. No abnormal foci of restricted diffusion to suggest acute or subacute ischemia. No other areas of remote cortical infarction. No evidence for acute intracranial hemorrhage. Few small chronic micro hemorrhages noted within the left thalamus and right paramedian pons, likely related to small vessel disease. No mass lesion, midline shift or mass effect. No hydrocephalus. No extra-axial fluid collection. Pituitary gland within normal limits. Vascular: Right vertebral artery hypoplastic and not well seen. Major intracranial vascular flow voids otherwise maintained. Skull and upper cervical spine: Craniocervical junction normal. Upper cervical  spine within normal limits. Bone marrow signal intensity normal. Moderate sized posterior scalp contusion noted. Sinuses/Orbits: Patient status post bilateral ocular lens replacement. Small left maxillary sinus retention cyst. Paranasal sinuses are otherwise clear. Trace right mastoid effusion, of doubtful significance. Other: None. IMPRESSION: 1. No acute intracranial abnormality. 2. Moderate sized posterior scalp contusion. 3. Moderate chronic microvascular ischemic disease with superimposed remote lacunar infarcts within the bilateral basal ganglia/corona radiata and left thalamus, with additional small remote bilateral cerebellar infarcts. Electronically Signed   By: Jeannine Boga M.D.   On: 12/27/2018 01:35   US Carotid Bilateral  Result Date: 12/27/2018 CLINICAL DATA:  Vertigo. EXAM: BILATERAL CAROTID DUPLEX ULTRASOUND TECHNIQUE: Pearline Cables scale imaging, color Doppler and duplex ultrasound were performed of bilateral carotid and vertebral arteries in the neck. COMPARISON:  None. FINDINGS: Criteria: Quantification of carotid stenosis is based on velocity parameters that correlate the residual internal carotid diameter with NASCET-based stenosis levels, using the diameter of the distal internal carotid lumen as the denominator for stenosis measurement. The following velocity measurements were obtained: RIGHT ICA: 125/31 cm/sec CCA: 42/59 cm/sec SYSTOLIC ICA/CCA RATIO:  1.5 ECA: 104 cm/sec LEFT ICA: 164/60 cm/sec CCA: 56/38 cm/sec SYSTOLIC ICA/CCA RATIO:  1.9 ECA: 94 cm/sec RIGHT CAROTID ARTERY: Mild atherosclerotic change in the distal CCA and proximal bulb. RIGHT VERTEBRAL ARTERY:  Antegrade flow. LEFT CAROTID ARTERY:  Antegrade flow. LEFT VERTEBRAL ARTERY:  No significant atherosclerotic change. IMPRESSION: 1. Less than 50% stenosis in the right internal carotid artery. Based on systolic and diastolic velocities, there is between 50 and 69% stenosis in the left internal carotid artery. The velocities are  only mildly elevated in the left internal carotid arteries and I suspect closer to 50% stenosis based on velocities. Electronically Signed   By: Dorise Bullion III M.D   On: 12/27/2018 11:17    ASSESSMENT AND PLAN:   Active Problems:   Unsteady gait   1 Mechanical fall with subsequent head injury and posterior scalp hematoma.    follow neuro checks every 4 hours for  24 hours.  Pain management will be provided as needed. MRI of the head is negative for any internal injuries.  2.  Vertigo.    placed on aspirin.  Brain MRI is negative. Carotid Doppler studies with age-appropriate blockages but not major where it requires interventions. She continued to feel dizzy with physical therapy and ambulation.  Neurologist suggested vestibular exercise and started on meclizine.  3.  Anxiety/depression.  Her Paxil will be continued.  4.  Hypertension.  We will continue her Lopressor.  5.  Dyslipidemia.   continue her Zetia  6.  DVT prophylaxis.  She will be placed on SCDs for now we will hold off on Lovenox given her scalp hematoma   All the records are reviewed and case discussed with Care Management/Social Workerr. Management plans discussed with the patient, family and they are in agreement.  CODE STATUS: DNR  TOTAL TIME TAKING CARE OF THIS PATIENT: 35 minutes.    POSSIBLE D/C IN 1-2 DAYS, DEPENDING ON CLINICAL CONDITION.   Vaughan Basta M.D on 12/27/2018   Between 7am to 6pm - Pager - [phone]  After 6pm go to [email or web address] - password [email or web address]  Office  [phone]  CC: Primary care physician; Derinda Late, MD  Note: This dictation was prepared with Dragon dictation along with smaller phrase technology. Any transcriptional errors that result from this process are unintentional.

## 2018-12-27 NOTE — ED Notes (Signed)
MRI tech in room to speak with pt.

## 2018-12-27 NOTE — Evaluation (Signed)
Physical Therapy Evaluation Patient Details Name: Kayla Gray MRN: [id number] DOB: 1936-05-28 Today's Date: 12/27/2018   History of Present Illness  Patient is a 83 year old female presnting to the ED after being accidentally hit by her lawnmower. Patient with persistent dizziness since event. Patient with known PMH of anxiety/depression, hyperlipidemia, HTN  Clinical Impression  Patient with increased dizziness with all positional changes, requiring max assist to maintain balance following all transferring. Patient denies visual blurring, N/V. Patient with impairments in LE and core strength, standing balance, seated balance, endurance, gait impairments. Patient is currently limited in household and community ambulation, and all dynamic balance tasks; inhibiting her from full participation in safe ADLS, caring for handicapped daughter (every other weekend) and going on her weekly outing. Would benefit from skilled PT to address above deficits and promote optimal return to PLOF     Follow Up Recommendations SNF    Equipment Recommendations  Rolling walker with 5" wheels    Recommendations for Other Services Rehab consult     Precautions / Restrictions        Mobility  Bed Mobility Overal bed mobility: Modified Independent;Needs Assistance Bed Mobility: Supine to Sit     Supine to sit: Modified independent (Device/Increase time);Min assist     General bed mobility comments: increased time needed to complete transfer, CGA to remain sitting upright without LOB. Once sitting for a couple minutes, able to resist perturbations in sitting  Transfers Overall transfer level: Needs assistance   Transfers: Sit to/from Stand Sit to Stand: Max assist;Supervision         General transfer comment: Patient is able to stand with RW, cuing and increased time needed and is maxA for 66mins to remain standing with posterior lean to prevent fall backwards  Ambulation/Gait Ambulation/Gait  assistance: Max assist;Min guard Gait Distance (Feet): 18 Feet Assistive device: Rolling walker (2 wheeled) Gait Pattern/deviations: Shuffle Gait velocity: decreased   General Gait Details: Initially patient requires maxA with small steps to maintain balance with initiaiting gait, but is able to gain independence and balance with gaurding and assistance for room equipment mangament for shuffle steps  Stairs            Wheelchair Mobility    Modified Rankin (Stroke Patients Only)       Balance Overall balance assessment: Needs assistance   Sitting balance-Leahy Scale: Fair       Standing balance-Leahy Scale: Fair Standing balance comment: Initially with standing balance is poor, after 2 minutes is able to hold self up without perturbations to remain standing 30sec. Unable to complete any higher level balance tehcniques.                              Pertinent Vitals/Pain Pain Assessment: No/denies pain    Home Living Family/patient expects to be discharged to:: Private residence Living Arrangements: Spouse/significant other Available Help at Discharge: Family Type of Home: House Home Access: Stairs to enter Entrance Stairs-Rails: Right Entrance Stairs-Number of Steps: 3 Home Layout: Able to live on main level with bedroom/bathroom Home Equipment: Walker - 2 wheels;Cane - single point;Toilet riser;Grab bars - tub/shower Additional Comments: Pt utilizes SPC in community and no AD at home    Prior Function Level of Independence: Independent         Comments: Cares for handicapped daughter every other weekend, drives, weekly hair and lunch appointment, church     Hand Dominance  Extremity/Trunk Assessment   Upper Extremity Assessment Upper Extremity Assessment: Overall WFL for tasks assessed    Lower Extremity Assessment Lower Extremity Assessment: Overall WFL for tasks assessed    Cervical / Trunk Assessment Cervical / Trunk  Assessment: Normal  Communication   Communication: No difficulties  Cognition Arousal/Alertness: Awake/alert Behavior During Therapy: WFL for tasks assessed/performed Overall Cognitive Status: Within Functional Limits for tasks assessed                                        General Comments      Exercises Other Exercises Other Exercises: Patient able to complete supine ,. sit with some cuing from PT for efficiency. Following supine to sit, PT monitored dizziness symptoms for 2-6minutes until subsided to attempt stand. Patient unable to complete STS initially, able to complete on second attempt with PT giving cuing for hand placememnt and safety, and minA. Pt requires maxA from PT following transfer to remain standing. PT monitored symptoms 2-3 until subsided and patient attempted steps standing in place. Following PT cued patient for RW management and safety around room before using BSC. Patient reuqiring maxA following all position changes to prevent from LOB   Assessment/Plan    PT Assessment Patient needs continued PT services  PT Problem List Decreased strength;Decreased coordination;Pain;Decreased range of motion;Decreased activity tolerance;Decreased mobility;Decreased balance       PT Treatment Interventions DME instruction;Therapeutic exercise;Gait training;Balance training;Manual techniques;Stair training;Neuromuscular re-education;Functional mobility training;Therapeutic activities;Patient/family education    PT Goals (Current goals can be found in the Care Plan section)  Acute Rehab PT Goals Patient Stated Goal: Return home and go out to lunch with daughter PT Goal Formulation: With patient/family Time For Goal Achievement: 01/10/19    Frequency Min 2X/week   Barriers to discharge Decreased caregiver support Husband is 58 y/o    Co-evaluation               AM-PAC PT "6 Clicks" Mobility  Outcome Measure Help needed turning from your back to  your side while in a flat bed without using bedrails?: A Little Help needed moving from lying on your back to sitting on the side of a flat bed without using bedrails?: A Little Help needed moving to and from a bed to a chair (including a wheelchair)?: A Lot Help needed standing up from a chair using your arms (e.g., wheelchair or bedside chair)?: A Lot Help needed to walk in hospital room?: A Lot Help needed climbing 3-5 steps with a railing? : A Lot 6 Click Score: 14    End of Session Equipment Utilized During Treatment: Gait belt Activity Tolerance: Patient tolerated treatment well Patient left: in bed;with family/visitor present;with call bell/phone within reach;with bed alarm set Nurse Communication: Mobility status PT Visit Diagnosis: Unsteadiness on feet (R26.81);Dizziness and giddiness (R42);Difficulty in walking, not elsewhere classified (R26.2);Repeated falls (R29.6);History of falling (Z91.81);Other symptoms and signs involving the nervous system (R29.898)    Time: 0130-0220 PT Time Calculation (min) (ACUTE ONLY): 50 min   Charges:   PT Evaluation $PT Eval Moderate Complexity: 1 Mod PT Treatments $Gait Training: 8-22 mins $Therapeutic Activity: 8-22 mins       Shelton Silvas PT, DPT   Shelton Silvas 12/27/2018, 2:56 PM

## 2018-12-27 NOTE — ED Notes (Signed)
ED TO INPATIENT HANDOFF REPORT  ED Nurse Name and Phone #: Freeda Spivey [phone]   S Name/Age/Gender Kayla Gray 83 y.o. female Room/Bed: ED11A/ED11A  Code Status   Code Status: Full Code  Home/SNF/Other Home Patient oriented to: self, place, time and situation Is this baseline? Yes   Triage Complete: Triage complete  Chief Complaint Fall  Triage Note PT to ED via EMS from home. PT husband accidentally hit her with the lawn mower and knocked her down. PT denies LOC, takes 81mg  daily aspirin. N/V since incident. 4 mg zofran enroute. PT only complain is headache at this time.    Allergies Allergies  Allergen Reactions  . Codeine Hives  . Evista [Raloxifene]   . Statins Other (See Comments)    Leg muscle cramps  . Sulfa Antibiotics Hives    Level of Care/Admitting Diagnosis ED Disposition    ED Disposition Condition [facility]: [place] [100120]  Level of Care: Med-Surg [16]  Diagnosis: Unsteady gait [527782]  Admitting Physician: Christel Mormon [[id number]]  Attending Physician: Christel Mormon [[id number]]  PT Class (Do Not Modify): Observation [104]  PT Acc Code (Do Not Modify): Observation [10022]       B Medical/Surgery History Past Medical History:  Diagnosis Date  . Aortic stenosis   . Bilateral cataracts   . Bleeding nose    Thurs night (09/08/15) and Fri (09/09/15) left side.Bleeding didn't last long  . Breast cancer ([place]) 1999   right breast lumpectomy and rad tx  . Chronic diastolic (congestive) heart failure ([place])   . Depression   . Diverticulosis   . Gastroesophageal reflux disease   . Heart murmur   . Hyperlipidemia   . Hypertension   . Iron deficiency anemia   . Obstructive sleep apnea   . Osteoarthritis   . Osteopenia   . Peptic ulcer disease   . Personal history of radiation therapy   . S/P aortic valve replacement with bioprosthetic valve 09/16/2015   23 mm [employer] Elite bioprosthetic tissue  valve  . Stroke ([place])    11/14/13  . Supraventricular tachycardia ([place])   . Syncope and collapse   . Urinary tract infection    Past Surgical History:  Procedure Laterality Date  . AORTIC VALVE REPLACEMENT N/A 09/16/2015   Procedure: AORTIC VALVE REPLACEMENT (AVR);  Surgeon: Rexene Alberts, MD;  Location: [facility];  Service: Open Heart Surgery;  Laterality: N/A;  . BREAST BIOPSY Right 1999   breast ca radation  . BREAST EXCISIONAL BIOPSY Left yrs ago    benign  . BREAST LUMPECTOMY Right 1999   with radiation  . CARDIAC CATHETERIZATION N/A 08/17/2015   Procedure: Left Heart Cath and Coronary Angiography;  Surgeon: Minna Merritts, MD;  Location: [facility];  Service: Cardiovascular;  Laterality: N/A;  . CATARACT EXTRACTION, BILATERAL    . CHOLECYSTECTOMY    . JOINT REPLACEMENT Bilateral    knee replacement  . KNEE ARTHROSCOPY     right  . KNEE ARTHROSCOPY     left  . TEE WITHOUT CARDIOVERSION N/A 09/16/2015   Procedure: TRANSESOPHAGEAL ECHOCARDIOGRAM (TEE);  Surgeon: Rexene Alberts, MD;  Location: [facility];  Service: Open Heart Surgery;  Laterality: N/A;  . TONSILLECTOMY    . VAGINAL HYSTERECTOMY       A IV Location/Drains/Wounds Patient Lines/Drains/Airways Status   Active Line/Drains/Airways    None          Intake/Output Last  24 hours No intake or output data in the 24 hours ending 12/27/18 0127  Labs/Imaging Results for orders placed or performed during the hospital encounter of 12/26/18 (from the past 48 hour(s))  Glucose, capillary     Status: None   Collection Time: 12/26/18  6:31 PM  Result Value Ref Range   Glucose-Capillary 86 70 - 99 mg/dL  CBC     Status: Abnormal   Collection Time: 12/27/18 12:15 AM  Result Value Ref Range   WBC 8.0 4.0 - 10.5 K/uL   RBC 3.91 3.87 - 5.11 MIL/uL   Hemoglobin 10.9 (L) 12.0 - 15.0 g/dL   HCT 34.5 (L) 36.0 - 46.0 %   MCV 88.2 80.0 - 100.0 fL   MCH 27.9 26.0 - 34.0 pg   MCHC 31.6 30.0 - 36.0 g/dL   RDW 13.6 11.5  - 15.5 %   Platelets 127 (L) 150 - 400 K/uL   nRBC 0.0 0.0 - 0.2 %    Comment: Performed at [facility], [address] [place], [place] [address]  Basic metabolic panel     Status: Abnormal   Collection Time: 12/27/18 12:15 AM  Result Value Ref Range   Sodium 139 135 - 145 mmol/L   Potassium 4.2 3.5 - 5.1 mmol/L   Chloride 105 98 - 111 mmol/L   CO2 27 22 - 32 mmol/L   Glucose, Bld 107 (H) 70 - 99 mg/dL   BUN 19 8 - 23 mg/dL   Creatinine, Ser 0.89 0.44 - 1.00 mg/dL   Calcium 8.9 8.9 - 10.3 mg/dL   GFR calc non Af Amer >60 >60 mL/min   GFR calc Af Amer >60 >60 mL/min   Anion gap 7 5 - 15    Comment: Performed at [facility], [address] [place], [place] [address]   Ct Head Wo Contrast  Result Date: 12/26/2018 CLINICAL DATA:  Pedestrian struck by lawnmower. No loss of consciousness. Headache and nausea and vomiting. History of breast cancer, hypertension, hyperlipidemia and stroke. EXAM: CT HEAD WITHOUT CONTRAST TECHNIQUE: Contiguous axial images were obtained from the base of the skull through the vertex without intravenous contrast. COMPARISON:  MRI head October 11, 2019 FINDINGS: BRAIN: No intraparenchymal hemorrhage, mass effect nor midline shift. No parenchymal brain volume loss for age. No hydrocephalus. Patchy supratentorial white matter hypodensities within normal range for patient's age, though non-specific are most compatible with chronic small vessel ischemic disease. Punctate extra-axial calcifications. No acute large vascular territory infarcts. No abnormal extra-axial fluid collections. Basal cisterns are patent. VASCULAR: Mild calcific atherosclerosis of the carotid siphons. SKULL: No skull fracture.  Moderate parietal vertex scalp hematoma. SINUSES/ORBITS: Trace paranasal sinus mucosal thickening. Mastoid air cells are well aerated.The included ocular globes and orbital contents are non-suspicious. OTHER: None. IMPRESSION: 1. No acute intracranial  process.  Moderate scalp hematoma. 2. Otherwise negative non-contrast CT HEAD for age. Electronically Signed   By: Elon Alas M.D.   On: 12/26/2018 18:47    Pending Labs Unresulted Labs (From admission, onward)    Start     Ordered   12/27/18 7262  Basic metabolic panel  Tomorrow morning,   STAT     12/27/18 0116   12/27/18 0500  CBC  Tomorrow morning,   STAT     12/27/18 0116          Vitals/Pain Today's Vitals   12/26/18 2308 12/26/18 2330 12/27/18 0000 12/27/18 0015  BP:  (!) 154/75 (!) 153/71   Pulse: Marland Kitchen)  59 63  (!) 58  Resp:    12  Temp:      TempSrc:      SpO2: 97% 98%  98%  Weight:      Height:      PainSc:        Isolation Precautions No active isolations  Medications Medications  aspirin EC tablet 81 mg (has no administration in time range)  ezetimibe (ZETIA) tablet 10 mg (has no administration in time range)  metoprolol tartrate (LOPRESSOR) tablet 25 mg (has no administration in time range)  PARoxetine (PAXIL) tablet 20 mg (has no administration in time range)  pantoprazole (PROTONIX) EC tablet 40 mg (has no administration in time range)  fluticasone (FLONASE) 50 MCG/ACT nasal spray 2 spray (has no administration in time range)  multivitamin with minerals tablet 1 tablet (has no administration in time range)  Calcium Carbonate-Vitamin D 600-400 MG-UNIT 2 tablet (has no administration in time range)  enoxaparin (LOVENOX) injection 40 mg (has no administration in time range)  0.9 %  sodium chloride infusion (has no administration in time range)  acetaminophen (TYLENOL) tablet 650 mg (has no administration in time range)    Or  acetaminophen (TYLENOL) suppository 650 mg (has no administration in time range)  ketorolac (TORADOL) 30 MG/ML injection 15 mg (has no administration in time range)  traZODone (DESYREL) tablet 25 mg (has no administration in time range)  magnesium hydroxide (MILK OF MAGNESIA) suspension 30 mL (has no administration in time range)   ondansetron (ZOFRAN-ODT) disintegrating tablet 4 mg (4 mg Oral Given 12/26/18 1926)  meclizine (ANTIVERT) tablet 12.5 mg (12.5 mg Oral Given 12/26/18 1926)  acetaminophen (TYLENOL) tablet 1,000 mg (1,000 mg Oral Given 12/26/18 1926)  LORazepam (ATIVAN) tablet 0.5 mg (0.5 mg Oral Given 12/26/18 2134)  meclizine (ANTIVERT) tablet 12.5 mg (12.5 mg Oral Given 12/26/18 2135)    Mobility walks with person assist Low fall risk   Focused Assessments Cardiac Assessment Handoff:    Lab Results  Component Value Date   CKTOTAL 204 11/17/2011   CKMB 6.0 (H) 11/17/2011   TROPONINI <0.03 03/17/2018   No results found for: DDIMER Does the Patient currently have chest pain? No     R Recommendations: See Admitting Provider Note  Report given to:   Additional Notes:

## 2018-12-27 NOTE — ED Notes (Signed)
Pt was dry upon leaving the unit. Floor called prior to ED departure. Family left before transport to go home.

## 2018-12-27 NOTE — Consult Note (Signed)
Reason for Consult:Vertigo after fall Referring Physician: Anselm Jungling  CC: Vertigo after fall  HPI: Kayla Gray is an 83 y.o. female who was helping her husband with the lawnmower and fell backward hitting the back of her head on the concrete.  Patient did not lose consciousness.  Since the fall the patient has complained of occipital headache that is throbbing.  Also describes vertigo in all positions.  Had some associated nausea and vomiting.  Reports current headache rated at a 4/10.      Past Medical History:  Diagnosis Date  . Aortic stenosis   . Bilateral cataracts   . Bleeding nose    Thurs night (09/08/15) and Fri (09/09/15) left side.Bleeding didn't last long  . Breast cancer ([place]) 1999   right breast lumpectomy and rad tx  . Chronic diastolic (congestive) heart failure ([place])   . Depression   . Diverticulosis   . Gastroesophageal reflux disease   . Heart murmur   . Hyperlipidemia   . Hypertension   . Iron deficiency anemia   . Obstructive sleep apnea   . Osteoarthritis   . Osteopenia   . Peptic ulcer disease   . Personal history of radiation therapy   . S/P aortic valve replacement with bioprosthetic valve 09/16/2015   23 mm Edwards Intuity Elite bioprosthetic tissue valve  . Stroke ([place])    11/14/13  . Supraventricular tachycardia ([place])   . Syncope and collapse   . Urinary tract infection     Past Surgical History:  Procedure Laterality Date  . AORTIC VALVE REPLACEMENT N/A 09/16/2015   Procedure: AORTIC VALVE REPLACEMENT (AVR);  Surgeon: Rexene Alberts, MD;  Location: [facility];  Service: Open Heart Surgery;  Laterality: N/A;  . BREAST BIOPSY Right 1999   breast ca radation  . BREAST EXCISIONAL BIOPSY Left yrs ago    benign  . BREAST LUMPECTOMY Right 1999   with radiation  . CARDIAC CATHETERIZATION N/A 08/17/2015   Procedure: Left Heart Cath and Coronary Angiography;  Surgeon: Minna Merritts, MD;  Location: [facility];  Service: Cardiovascular;   Laterality: N/A;  . CATARACT EXTRACTION, BILATERAL    . CHOLECYSTECTOMY    . JOINT REPLACEMENT Bilateral    knee replacement  . KNEE ARTHROSCOPY     right  . KNEE ARTHROSCOPY     left  . TEE WITHOUT CARDIOVERSION N/A 09/16/2015   Procedure: TRANSESOPHAGEAL ECHOCARDIOGRAM (TEE);  Surgeon: Rexene Alberts, MD;  Location: [facility];  Service: Open Heart Surgery;  Laterality: N/A;  . TONSILLECTOMY    . VAGINAL HYSTERECTOMY      Family History  Problem Relation Age of Onset  . Heart failure Mother   . Breast cancer Maternal Aunt     Social History:  reports that she has never smoked. She has never used smokeless tobacco. She reports current alcohol use. She reports that she does not use drugs.  Allergies  Allergen Reactions  . Codeine Hives  . Evista [Raloxifene]   . Statins Other (See Comments)    Leg muscle cramps  . Sulfa Antibiotics Hives    Medications:  I have reviewed the patient's current medications. Prior to Admission:  Medications Prior to Admission  Medication Sig Dispense Refill Last Dose  . acetaminophen (TYLENOL) 325 MG tablet Take 650 mg by mouth every 6 (six) hours as needed.   prn at prn  . aspirin EC 81 MG tablet Take 1 tablet (81 mg total) by mouth daily. 90 tablet 3  Past Week at 2200  . Calcium Carbonate-Vitamin D (CALCIUM 600 + D PO) Take by mouth 2 (two) times daily.   12/26/2018 at Unknown time  . ezetimibe (ZETIA) 10 MG tablet Take 10 mg by mouth daily.   Past Week at Unknown time  . fluticasone (FLONASE) 50 MCG/ACT nasal spray Place 2 sprays into the nose daily as needed.   prn at prn  . metoprolol tartrate (LOPRESSOR) 25 MG tablet TAKE 1 TABLET (25 MG TOTAL) BY MOUTH 2 (TWO) TIMES DAILY. 180 tablet 2 12/26/2018 at Unknown time  . Multiple Vitamin (MULTIVITAMIN) tablet Take 1 tablet by mouth daily.   12/26/2018 at Unknown time  . pantoprazole (PROTONIX) 40 MG tablet Take 40 mg by mouth daily as needed.    prn at prn  . PARoxetine (PAXIL) 20 MG tablet Take 20  mg by mouth daily.   12/26/2018 at Unknown time   Scheduled: . aspirin EC  81 mg Oral Daily  . calcium-vitamin D  2 tablet Oral BID  . ezetimibe  10 mg Oral QHS  . fluticasone  2 spray Each Nare Daily  . metoprolol tartrate  25 mg Oral BID  . multivitamin with minerals  1 tablet Oral Daily  . pantoprazole  40 mg Oral Daily  . PARoxetine  20 mg Oral Daily  . [START ON 12/28/2018] pneumococcal 23 valent vaccine  0.5 mL Intramuscular Tomorrow-1000    ROS: History obtained from the patient  General ROS: negative for - chills, fatigue, fever, night sweats, weight gain or weight loss Psychological ROS: difficulty with memory Ophthalmic ROS: negative for - blurry vision, double vision, eye pain or loss of vision ENT ROS: vertigo Allergy and Immunology ROS: negative for - hives or itchy/watery eyes Hematological and Lymphatic ROS: negative for - bleeding problems, bruising or swollen lymph nodes Endocrine ROS: negative for - galactorrhea, hair pattern changes, polydipsia/polyuria or temperature intolerance Respiratory ROS: negative for - cough, hemoptysis, shortness of breath or wheezing Cardiovascular ROS: negative for - chest pain, dyspnea on exertion, edema or irregular heartbeat Gastrointestinal ROS: nausea/vomiting Genito-Urinary ROS: negative for - dysuria, hematuria, incontinence or urinary frequency/urgency Musculoskeletal ROS: negative for - joint swelling or muscular weakness Neurological ROS: as noted in HPI Dermatological ROS: negative for rash and skin lesion changes  Physical Examination: Blood pressure 135/74, pulse 63, temperature 98 F (36.7 C), temperature source Oral, resp. rate 18, height 5\' 2"  (1.575 m), weight 77.1 kg, SpO2 94 %.  HEENT-  Occipital hematoma.  Normal external eye and conjunctiva.  Normal TM's bilaterally.  Normal auditory canals and external ears. Normal external nose, mucus membranes and septum.  Normal pharynx. Cardiovascular- S1, S2 normal, pulses  palpable throughout   Lungs- chest clear, no wheezing, rales, normal symmetric air entry Abdomen- soft, non-tender; bowel sounds normal; no masses,  no organomegaly Extremities- no edema Lymph-no adenopathy palpable Musculoskeletal-no joint tenderness, deformity or swelling Skin-warm and dry, no hyperpigmentation, vitiligo, or suspicious lesions  Neurological Examination   Mental Status: Alert, oriented, thought content appropriate.  Speech fluent without evidence of aphasia.  Able to follow 3 step commands without difficulty. Cranial Nerves: II: Discs flat bilaterally; Visual fields grossly normal, pupils equal, round, reactive to light and accommodation III,IV, VI: ptosis not present, extra-ocular motions intact bilaterally V,VII: smile symmetric, facial light touch sensation normal bilaterally VIII: hearing normal bilaterally IX,X: gag reflex present XI: bilateral shoulder shrug XII: midline tongue extension Motor: Right : Upper extremity   5/5    Left:  Upper extremity   5/5  Lower extremity   5/5     Lower extremity   5/5 Tone and bulk:normal tone throughout; no atrophy noted Sensory: Pinprick and light touch intact throughout, bilaterally Deep Tendon Reflexes: 2+ in the upper extremities and absent in the lower extremities Plantars: Right: mute   Left: mute Cerebellar: Normal finger-to-nose and normal heel-to-shin testing bilaterally Gait: not tested due to safety concerns    Laboratory Studies:   Basic Metabolic Panel: Recent Labs  Lab 12/27/18 0015 12/27/18 0515  NA 139 142  K 4.2 4.0  CL 105 107  CO2 27 28  GLUCOSE 107* 99  BUN 19 17  CREATININE 0.89 0.96  CALCIUM 8.9 9.1    Liver Function Tests: No results for input(s): AST, ALT, ALKPHOS, BILITOT, PROT, ALBUMIN in the last 168 hours. No results for input(s): LIPASE, AMYLASE in the last 168 hours. No results for input(s): AMMONIA in the last 168 hours.  CBC: Recent Labs  Lab 12/27/18 0015  12/27/18 0515  WBC 8.0 5.6  HGB 10.9* 11.2*  HCT 34.5* 35.2*  MCV 88.2 87.6  PLT 127* 138*    Cardiac Enzymes: No results for input(s): CKTOTAL, CKMB, CKMBINDEX, TROPONINI in the last 168 hours.  BNP: Invalid input(s): POCBNP  CBG: Recent Labs  Lab 12/26/18 1831  GLUCAP 35    Microbiology: Results for orders placed or performed during the hospital encounter of 09/09/15  Surgical pcr screen     Status: None   Collection Time: 09/09/15 12:37 PM  Result Value Ref Range Status   MRSA, PCR NEGATIVE NEGATIVE Final   Staphylococcus aureus NEGATIVE NEGATIVE Final    Comment:        The Xpert SA Assay (FDA approved for NASAL specimens in patients over 47 years of age), is one component of a comprehensive surveillance program.  Test performance has been validated by [facility] for patients greater than or equal to 81 year old. It is not intended to diagnose infection nor to guide or monitor treatment.     Coagulation Studies: No results for input(s): LABPROT, INR in the last 72 hours.  Urinalysis: No results for input(s): COLORURINE, LABSPEC, PHURINE, GLUCOSEU, HGBUR, BILIRUBINUR, KETONESUR, PROTEINUR, UROBILINOGEN, NITRITE, LEUKOCYTESUR in the last 168 hours.  Invalid input(s): APPERANCEUR  Lipid Panel:     Component Value Date/Time   CHOL 202 (H) 11/17/2013 0513   TRIG 159 11/17/2013 0513   HDL 41 11/17/2013 0513   VLDL 32 11/17/2013 0513   LDLCALC 129 (H) 11/17/2013 0513    HgbA1C:  Lab Results  Component Value Date   HGBA1C 5.8 (H) 09/09/2015    Urine Drug Screen:  No results found for: LABOPIA, COCAINSCRNUR, LABBENZ, AMPHETMU, THCU, LABBARB  Alcohol Level: No results for input(s): ETH in the last 168 hours.  Other results: EKG: sinus rhythm at 59 bpm.  Imaging: Ct Head Wo Contrast  Result Date: 12/26/2018 CLINICAL DATA:  Pedestrian struck by lawnmower. No loss of consciousness. Headache and nausea and vomiting. History of breast cancer,  hypertension, hyperlipidemia and stroke. EXAM: CT HEAD WITHOUT CONTRAST TECHNIQUE: Contiguous axial images were obtained from the base of the skull through the vertex without intravenous contrast. COMPARISON:  MRI head October 11, 2019 FINDINGS: BRAIN: No intraparenchymal hemorrhage, mass effect nor midline shift. No parenchymal brain volume loss for age. No hydrocephalus. Patchy supratentorial white matter hypodensities within normal range for patient's age, though non-specific are most compatible with chronic small vessel ischemic disease. Punctate extra-axial calcifications. No  acute large vascular territory infarcts. No abnormal extra-axial fluid collections. Basal cisterns are patent. VASCULAR: Mild calcific atherosclerosis of the carotid siphons. SKULL: No skull fracture.  Moderate parietal vertex scalp hematoma. SINUSES/ORBITS: Trace paranasal sinus mucosal thickening. Mastoid air cells are well aerated.The included ocular globes and orbital contents are non-suspicious. OTHER: None. IMPRESSION: 1. No acute intracranial process.  Moderate scalp hematoma. 2. Otherwise negative non-contrast CT HEAD for age. Electronically Signed   By: Elon Alas M.D.   On: 12/26/2018 18:47   Mr Brain Wo Contrast  Result Date: 12/27/2018 CLINICAL DATA:  Initial evaluation for acute vertigo, fall. EXAM: MRI HEAD WITHOUT CONTRAST TECHNIQUE: Multiplanar, multiecho pulse sequences of the brain and surrounding structures were obtained without intravenous contrast. COMPARISON:  Prior CT from 12/26/2018. FINDINGS: Brain: Generalized age-related cerebral atrophy. Patchy and confluent T2/FLAIR hyperintensity within the periventricular deep white matter both cerebral hemispheres, most consistent with chronic microvascular ischemic disease, moderate nature. Few superimposed remote lacunar infarcts noted within the bilateral basal ganglia/corona radiata. Small remote lacunar infarct noted within the left thalamus. Small chronic  bilateral cerebellar infarcts noted as well. No abnormal foci of restricted diffusion to suggest acute or subacute ischemia. No other areas of remote cortical infarction. No evidence for acute intracranial hemorrhage. Few small chronic micro hemorrhages noted within the left thalamus and right paramedian pons, likely related to small vessel disease. No mass lesion, midline shift or mass effect. No hydrocephalus. No extra-axial fluid collection. Pituitary gland within normal limits. Vascular: Right vertebral artery hypoplastic and not well seen. Major intracranial vascular flow voids otherwise maintained. Skull and upper cervical spine: Craniocervical junction normal. Upper cervical spine within normal limits. Bone marrow signal intensity normal. Moderate sized posterior scalp contusion noted. Sinuses/Orbits: Patient status post bilateral ocular lens replacement. Small left maxillary sinus retention cyst. Paranasal sinuses are otherwise clear. Trace right mastoid effusion, of doubtful significance. Other: None. IMPRESSION: 1. No acute intracranial abnormality. 2. Moderate sized posterior scalp contusion. 3. Moderate chronic microvascular ischemic disease with superimposed remote lacunar infarcts within the bilateral basal ganglia/corona radiata and left thalamus, with additional small remote bilateral cerebellar infarcts. Electronically Signed   By: Jeannine Boga M.D.   On: 12/27/2018 01:35     Assessment/Plan: 83 year old female presenting with headache, nausea, vomiting, vertigo after a fall.  Patient without focality on neurological examination.  MRI of the brain reviewed and shows no acute changes.  Symptoms likely represent a post-concussive syndrome.  Vestibular exercises explained to address vertigo.  Symptoms should slowly improve with time.  This was explained to patient and husband.    Recommendations: 1.  Tylenol prn headache 2.  Continued vestibular exercises 3.  Follow up with PCP on an  outpatient basis  Alexis Goodell, MD Neurology [phone] 12/27/2018, 10:53 AM

## 2018-12-28 ENCOUNTER — Observation Stay: Payer: PPO

## 2018-12-28 DIAGNOSIS — F419 Anxiety disorder, unspecified: Secondary | ICD-10-CM | POA: Diagnosis not present

## 2018-12-28 DIAGNOSIS — R270 Ataxia, unspecified: Secondary | ICD-10-CM | POA: Diagnosis not present

## 2018-12-28 DIAGNOSIS — R42 Dizziness and giddiness: Secondary | ICD-10-CM | POA: Diagnosis not present

## 2018-12-28 DIAGNOSIS — S0990XA Unspecified injury of head, initial encounter: Secondary | ICD-10-CM | POA: Diagnosis not present

## 2018-12-28 NOTE — TOC Initial Note (Signed)
Transition of Care [facility]) - Initial/Assessment Note    Patient Details  Name: Kayla Gray MRN: [id number] Date of Birth: 11-Nov-1935  [facility] CM/SW Contact:    [name], LCSW Phone Number: 12/28/2018, 2:22 PM  Clinical Narrative: The CSW met with the patient, her spouse, and her daughter at bedside to discuss discharge planning and the PT recommendation for SNF. The CSW provided the CMS.gov list of approved SNFs in [facility], and the patient indicated [facility] as her first choice and [facility] as her second choice. The CSW discussed the need for prior authorization from [employer]. The CSW also advised the patient and her family of the no visitation policies in place at all SNFs until further notice due to COVID-19 precautions. The CSW answered the patient's and her family's questions regarding how to bring the patient changes of clothing etc.   The patient is independent with all ADLs and IADLs including driving, and she has no DME at home. The patient hope to return to [facility] after SNF stay, and she is willing to have [facility] after SNF for further care.  The patient will most likely discharge, tomorrow. The CSW has begun the [employer] process and the referral for SNF. The TOC is following for transition of care to the facility of the patient's choice. Bed offers will be presented as available.               Expected Discharge Plan: [place] Barriers to Discharge: No Barriers Identified   Patient Goals and CMS Choice Patient states their goals for this hospitalization and ongoing recovery are:: "I want to be able to get back to driving and doing things around the house like before." CMS Medicare.gov Compare Post Acute Care list provided to:: Patient Choice offered to / list presented to : Patient, HC POA / Guardian, Spouse  Expected Discharge Plan and Services Expected Discharge Plan: [place] Discharge Planning Services: Follow-up appt scheduled Post Acute Care Choice: [place] arrangements for the past 2 months: Single Family Home                 DME Arranged: N/A DME Agency: NA HH Arranged: NA [place] Agency: NA  Prior Living Arrangements/Services Living arrangements for the past 2 months: Single Family Home Lives with:: Spouse Patient language and need for interpreter reviewed:: Yes Do you feel safe going back to the place where you live?: Yes      Need for Family Participation in Patient Care: No (Comment) Care giver support system in place?: Yes (comment)   Criminal Activity/Legal Involvement Pertinent to Current Situation/Hospitalization: No - Comment as needed  Activities of Daily Living Home Assistive Devices/Equipment: Cane (specify quad or straight) ADL Screening (condition at time of admission) Patient's cognitive ability adequate to safely complete daily activities?: Yes Is the patient deaf or have difficulty hearing?: No Does the patient have difficulty seeing, even when wearing glasses/contacts?: No Does the patient have difficulty concentrating, remembering, or making decisions?: Yes Patient able to express need for assistance with ADLs?: Yes Does the patient have difficulty dressing or bathing?: No Independently performs ADLs?: Yes (appropriate for developmental age) Does the patient have difficulty walking or climbing stairs?: Yes Weakness of Legs: Both Weakness of Arms/Hands: None  Permission Sought/Granted Permission sought to share information with : Facility Art therapist granted to share information with : Yes, Verbal Permission Granted     Permission granted to  share info w AGENCY: [facility] area SNFs        Emotional Assessment Appearance:: Appears stated age Attitude/Demeanor/Rapport: Gracious, Charismatic, Engaged Affect (typically observed): Pleasant, Stable Orientation: :  Oriented to Self, Oriented to Place, Oriented to  Time, Oriented to Situation Alcohol / Substance Use: Never Used Psych Involvement: No (comment)  Admission diagnosis:  Vertigo [R42] Hematoma [T14.8XXA] Closed head injury, initial encounter [A15.87GB] Patient Active Problem List   Diagnosis Date Noted  . Unsteady gait 12/27/2018  . Paroxysmal atrial fibrillation ([place]) 11/14/2015  . Encounter for therapeutic drug monitoring 09/29/2015  . Chronic diastolic (congestive) heart failure ([place])   . SOB (shortness of breath) 08/17/2015  . Angina pectoris ([place]) 08/17/2015  . Low back pain 08/12/2015  . Sinus tachycardia 01/06/2014  . Osteoarthritis of both knees 01/06/2014  . Aortic valve stenosis 11/27/2012  . SVT (supraventricular tachycardia) ([place]) 11/27/2012  . Syncope 11/27/2012   PCP:  Derinda Late, MD Pharmacy:   [id number] [place], [address]Phone: [phone]: [phone] [facility] [place] [address]Phone: [phone]: [phone]    Social Determinants of Health (SDOH) Interventions  The patient has access to food, medications, and all basic needs. The patient has a supportive family.  Readmission Risk Interventions  Readmission Risk Prevention Plan 12/28/2018  Post Dischage Appt Not Complete  Appt Comments Specialist appointment will be set prior to discharge  Medication Screening Not Complete  Medication Screening Not Complete Comment Discharge medications will be assessed at time of discharge.  Transportation Screening Complete  Some recent data might be hidden

## 2018-12-28 NOTE — NC FL2 (Signed)
[place] LEVEL OF CARE SCREENING TOOL     IDENTIFICATION  Patient Name: Kayla Gray Birthdate: [birth year] Sex: female Admission Date (Current Location): 12/26/2018  [place] and [place] Number:  Metallurgist and Address:  [facility], [address] [place], [place] [address]      Provider Number: [id number]  Attending Physician Name and Address:  Vaughan Basta, *  Relative Name and Phone Number:  Nolon Lennert (Daughter) [phone] or Petronella Shuford (Spouse) [phone]    Current Level of Care: Hospital Recommended Level of Care: [place] Prior Approval Number:    Date Approved/Denied:   PASRR Number: [id number]  Discharge Plan: SNF    Current Diagnoses: Patient Active Problem List   Diagnosis Date Noted  . Unsteady gait 12/27/2018  . Paroxysmal atrial fibrillation ([place]) 11/14/2015  . Encounter for therapeutic drug monitoring 09/29/2015  . Chronic diastolic (congestive) heart failure ([place])   . SOB (shortness of breath) 08/17/2015  . Angina pectoris ([place]) 08/17/2015  . Low back pain 08/12/2015  . Sinus tachycardia 01/06/2014  . Osteoarthritis of both knees 01/06/2014  . Aortic valve stenosis 11/27/2012  . SVT (supraventricular tachycardia) ([place]) 11/27/2012  . Syncope 11/27/2012    Orientation RESPIRATION BLADDER Height & Weight     Self, Time, Situation, Place  Normal Continent Weight: 170 lb (77.1 kg) Height:  5\' 2"  (157.5 cm)  BEHAVIORAL SYMPTOMS/MOOD NEUROLOGICAL BOWEL NUTRITION STATUS      Continent Diet(Heart healthy)  AMBULATORY STATUS COMMUNICATION OF NEEDS Skin   Extensive Assist Verbally Bruising                       Personal Care Assistance Level of Assistance  Bathing, Feeding, Dressing Bathing Assistance: Maximum assistance Feeding assistance: Independent Dressing Assistance: Maximum assistance     Functional Limitations Info  Sight, Hearing, Speech  Sight Info: Adequate Hearing Info: Adequate Speech Info: Adequate    SPECIAL CARE FACTORS FREQUENCY  PT (By licensed PT), OT (By licensed OT)     PT Frequency: 5X OT Frequency: 3X            Contractures Contractures Info: Not present    Additional Factors Info  Code Status, Allergies Code Status Info: DNR Allergies Info: Codeine, Evista Raloxifene, Statins, Sulfa Antibiotics           Current Medications (12/28/2018):  This is the current hospital active medication list Current Facility-Administered Medications  Medication Dose Route Frequency Provider Last Rate Last Dose  . 0.9 %  sodium chloride infusion   Intravenous Continuous Mansy, Jan A, MD 100 mL/hr at 12/27/18 2355    . acetaminophen (TYLENOL) tablet 650 mg  650 mg Oral Q6H PRN Mansy, Jan A, MD   650 mg at 12/28/18 0520   Or  . acetaminophen (TYLENOL) suppository 650 mg  650 mg Rectal Q6H PRN Mansy, Jan A, MD      . aspirin EC tablet 81 mg  81 mg Oral Daily Mansy, Jan A, MD   81 mg at 12/28/18 0826  . calcium-vitamin D (OSCAL WITH D) 500-200 MG-UNIT per tablet 2 tablet  2 tablet Oral BID Mansy, Arvella Merles, MD   2 tablet at 12/28/18 0827  . ezetimibe (ZETIA) tablet 10 mg  10 mg Oral QHS Mansy, Jan A, MD   10 mg at 12/27/18 2110  . fluticasone (FLONASE) 50 MCG/ACT nasal spray 2 spray  2 spray Each Nare Daily Mansy, Arvella Merles, MD   2  spray at 12/28/18 0829  . ketorolac (TORADOL) 30 MG/ML injection 15 mg  15 mg Intravenous Q6H PRN Mansy, Jan A, MD   15 mg at 12/28/18 0826  . magnesium hydroxide (MILK OF MAGNESIA) suspension 30 mL  30 mL Oral Daily PRN Mansy, Jan A, MD      . meclizine (ANTIVERT) tablet 12.5 mg  12.5 mg Oral TID PRN Mansy, Jan A, MD      . metoprolol tartrate (LOPRESSOR) tablet 25 mg  25 mg Oral BID Mansy, Jan A, MD   25 mg at 12/28/18 2800  . multivitamin with minerals tablet 1 tablet  1 tablet Oral Daily Mansy, Jan A, MD   1 tablet at 12/28/18 0826  . pantoprazole (PROTONIX) EC tablet 40 mg  40 mg Oral Daily  Mansy, Jan A, MD   40 mg at 12/28/18 0827  . PARoxetine (PAXIL) tablet 20 mg  20 mg Oral Daily Mansy, Jan A, MD   20 mg at 12/28/18 0827  . traZODone (DESYREL) tablet 25 mg  25 mg Oral QHS PRN Mansy, Arvella Merles, MD         Discharge Medications: Please see discharge summary for a list of discharge medications.  Relevant Imaging Results:  Relevant Lab Results:   Additional Information SS# [id number]  [name], LCSW

## 2018-12-28 NOTE — Progress Notes (Signed)
[facility] NAME: Kayla Gray    MR#:  [id number]  DATE OF BIRTH:  [birth year]  SUBJECTIVE:  CHIEF COMPLAINT:   Chief Complaint  Patient presents with  . Head Injury   Came after a fall and head injury and feeling dizzy. She had 2 falls in 2019.  Continue to feel dizzy currently.  MRI negative. REVIEW OF SYSTEMS:  CONSTITUTIONAL: No fever, have fatigue or weakness.  EYES: No blurred or double vision.  EARS, NOSE, AND THROAT: No tinnitus or ear pain.  RESPIRATORY: No cough, shortness of breath, wheezing or hemoptysis.  CARDIOVASCULAR: No chest pain, orthopnea, edema.  GASTROINTESTINAL: No nausea, vomiting, diarrhea or abdominal pain.  GENITOURINARY: No dysuria, hematuria.  ENDOCRINE: No polyuria, nocturia,  HEMATOLOGY: No anemia, easy bruising or bleeding SKIN: No rash or lesion. MUSCULOSKELETAL: No joint pain or arthritis.   NEUROLOGIC: No tingling, numbness, weakness.  PSYCHIATRY: No anxiety or depression.   ROS  DRUG ALLERGIES:   Allergies  Allergen Reactions  . Codeine Hives  . Evista [Raloxifene]   . Statins Other (See Comments)    Leg muscle cramps  . Sulfa Antibiotics Hives    VITALS:  Blood pressure (!) 168/69, pulse 61, temperature 97.7 F (36.5 C), temperature source Oral, resp. rate 16, height 5\' 2"  (1.575 m), weight 77.1 kg, SpO2 97 %.  PHYSICAL EXAMINATION:  GENERAL:  83 y.o.-year-old patient lying in the bed with no acute distress.  EYES: Pupils equal, round, reactive to light and accommodation. No scleral icterus. Extraocular muscles intact.  HEENT: Head atraumatic, normocephalic. Oropharynx and nasopharynx clear.  NECK:  Supple, no jugular venous distention. No thyroid enlargement, no tenderness.  LUNGS: Normal breath sounds bilaterally, no wheezing, rales,rhonchi or crepitation. No use of accessory muscles of respiration.  CARDIOVASCULAR: S1, S2 normal. No murmurs, rubs, or gallops.  ABDOMEN:  Soft, nontender, nondistended. Bowel sounds present. No organomegaly or mass.  EXTREMITIES: No pedal edema, cyanosis, or clubbing.  NEUROLOGIC: Cranial nerves II through XII are intact. Muscle strength 4/5 in all extremities. Sensation intact. Gait not checked.  PSYCHIATRIC: The patient is alert and oriented x 3.  SKIN: No obvious rash, lesion, or ulcer.   Physical Exam LABORATORY PANEL:   CBC Recent Labs  Lab 12/27/18 0515  WBC 5.6  HGB 11.2*  HCT 35.2*  PLT 138*   ------------------------------------------------------------------------------------------------------------------  Chemistries  Recent Labs  Lab 12/27/18 0515  NA 142  K 4.0  CL 107  CO2 28  GLUCOSE 99  BUN 17  CREATININE 0.96  CALCIUM 9.1   ------------------------------------------------------------------------------------------------------------------  Cardiac Enzymes No results for input(s): TROPONINI in the last 168 hours. ------------------------------------------------------------------------------------------------------------------  RADIOLOGY:  Ct Head Wo Contrast  Result Date: 12/26/2018 CLINICAL DATA:  Pedestrian struck by lawnmower. No loss of consciousness. Headache and nausea and vomiting. History of breast cancer, hypertension, hyperlipidemia and stroke. EXAM: CT HEAD WITHOUT CONTRAST TECHNIQUE: Contiguous axial images were obtained from the base of the skull through the vertex without intravenous contrast. COMPARISON:  MRI head October 11, 2019 FINDINGS: BRAIN: No intraparenchymal hemorrhage, mass effect nor midline shift. No parenchymal brain volume loss for age. No hydrocephalus. Patchy supratentorial white matter hypodensities within normal range for patient's age, though non-specific are most compatible with chronic small vessel ischemic disease. Punctate extra-axial calcifications. No acute large vascular territory infarcts. No abnormal extra-axial fluid collections. Basal cisterns are  patent. VASCULAR: Mild calcific atherosclerosis of the carotid siphons. SKULL: No skull fracture.  Moderate parietal vertex  scalp hematoma. SINUSES/ORBITS: Trace paranasal sinus mucosal thickening. Mastoid air cells are well aerated.The included ocular globes and orbital contents are non-suspicious. OTHER: None. IMPRESSION: 1. No acute intracranial process.  Moderate scalp hematoma. 2. Otherwise negative non-contrast CT HEAD for age. Electronically Signed   By: Elon Alas M.D.   On: 12/26/2018 18:47   Mr Brain Wo Contrast  Result Date: 12/27/2018 CLINICAL DATA:  Initial evaluation for acute vertigo, fall. EXAM: MRI HEAD WITHOUT CONTRAST TECHNIQUE: Multiplanar, multiecho pulse sequences of the brain and surrounding structures were obtained without intravenous contrast. COMPARISON:  Prior CT from 12/26/2018. FINDINGS: Brain: Generalized age-related cerebral atrophy. Patchy and confluent T2/FLAIR hyperintensity within the periventricular deep white matter both cerebral hemispheres, most consistent with chronic microvascular ischemic disease, moderate nature. Few superimposed remote lacunar infarcts noted within the bilateral basal ganglia/corona radiata. Small remote lacunar infarct noted within the left thalamus. Small chronic bilateral cerebellar infarcts noted as well. No abnormal foci of restricted diffusion to suggest acute or subacute ischemia. No other areas of remote cortical infarction. No evidence for acute intracranial hemorrhage. Few small chronic micro hemorrhages noted within the left thalamus and right paramedian pons, likely related to small vessel disease. No mass lesion, midline shift or mass effect. No hydrocephalus. No extra-axial fluid collection. Pituitary gland within normal limits. Vascular: Right vertebral artery hypoplastic and not well seen. Major intracranial vascular flow voids otherwise maintained. Skull and upper cervical spine: Craniocervical junction normal. Upper cervical  spine within normal limits. Bone marrow signal intensity normal. Moderate sized posterior scalp contusion noted. Sinuses/Orbits: Patient status post bilateral ocular lens replacement. Small left maxillary sinus retention cyst. Paranasal sinuses are otherwise clear. Trace right mastoid effusion, of doubtful significance. Other: None. IMPRESSION: 1. No acute intracranial abnormality. 2. Moderate sized posterior scalp contusion. 3. Moderate chronic microvascular ischemic disease with superimposed remote lacunar infarcts within the bilateral basal ganglia/corona radiata and left thalamus, with additional small remote bilateral cerebellar infarcts. Electronically Signed   By: Jeannine Boga M.D.   On: 12/27/2018 01:35   US Carotid Bilateral  Result Date: 12/27/2018 CLINICAL DATA:  Vertigo. EXAM: BILATERAL CAROTID DUPLEX ULTRASOUND TECHNIQUE: Pearline Cables scale imaging, color Doppler and duplex ultrasound were performed of bilateral carotid and vertebral arteries in the neck. COMPARISON:  None. FINDINGS: Criteria: Quantification of carotid stenosis is based on velocity parameters that correlate the residual internal carotid diameter with NASCET-based stenosis levels, using the diameter of the distal internal carotid lumen as the denominator for stenosis measurement. The following velocity measurements were obtained: RIGHT ICA: 125/31 cm/sec CCA: 66/29 cm/sec SYSTOLIC ICA/CCA RATIO:  1.5 ECA: 104 cm/sec LEFT ICA: 164/60 cm/sec CCA: 47/65 cm/sec SYSTOLIC ICA/CCA RATIO:  1.9 ECA: 94 cm/sec RIGHT CAROTID ARTERY: Mild atherosclerotic change in the distal CCA and proximal bulb. RIGHT VERTEBRAL ARTERY:  Antegrade flow. LEFT CAROTID ARTERY:  Antegrade flow. LEFT VERTEBRAL ARTERY:  No significant atherosclerotic change. IMPRESSION: 1. Less than 50% stenosis in the right internal carotid artery. Based on systolic and diastolic velocities, there is between 50 and 69% stenosis in the left internal carotid artery. The velocities are  only mildly elevated in the left internal carotid arteries and I suspect closer to 50% stenosis based on velocities. Electronically Signed   By: Dorise Bullion III M.D   On: 12/27/2018 11:17    ASSESSMENT AND PLAN:   Active Problems:   Unsteady gait   1 Mechanical fall with subsequent head injury and posterior scalp hematoma.    follow neuro checks every 4 hours for  24 hours.  Pain management will be provided as needed. MRI of the head is negative for any internal injuries.  2.  Vertigo.    placed on aspirin.  Brain MRI is negative. Carotid Doppler studies with age-appropriate blockages but not major where it requires interventions. She continued to feel dizzy with physical therapy and ambulation.  Neurologist suggested vestibular exercise and started on meclizine.  I have called physical therapy for vestibular exercises.  3.  Anxiety/depression.  Her Paxil will be continued.  4.  Hypertension.  We will continue her Lopressor.  5.  Dyslipidemia.   continue her Zetia  6.  DVT prophylaxis.  She will be placed on SCDs for now we will hold off on Lovenox given her scalp hematoma   All the records are reviewed and case discussed with Care Management/Social Workerr. Management plans discussed with the patient, family and they are in agreement.  CODE STATUS: DNR  TOTAL TIME TAKING CARE OF THIS PATIENT: 35 minutes.  Patient's daughter was present in the room today during my visit.  POSSIBLE D/C IN 1-2 DAYS, DEPENDING ON CLINICAL CONDITION.   Vaughan Basta M.D on 12/28/2018   Between 7am to 6pm - Pager - [phone]  After 6pm go to [email or web address] - password [email or web address] [facility]  Office  [phone]  CC: Primary care physician; Derinda Late, MD  Note: This dictation was prepared with Dragon dictation along with smaller phrase technology. Any transcriptional errors that result from this process are unintentional.

## 2018-12-29 DIAGNOSIS — F419 Anxiety disorder, unspecified: Secondary | ICD-10-CM | POA: Diagnosis not present

## 2018-12-29 DIAGNOSIS — S0990XA Unspecified injury of head, initial encounter: Secondary | ICD-10-CM | POA: Diagnosis not present

## 2018-12-29 DIAGNOSIS — R270 Ataxia, unspecified: Secondary | ICD-10-CM | POA: Diagnosis not present

## 2018-12-29 DIAGNOSIS — R42 Dizziness and giddiness: Secondary | ICD-10-CM | POA: Diagnosis not present

## 2018-12-29 MED ORDER — MECLIZINE HCL 12.5 MG PO TABS: 12.5000 mg | ORAL_TABLET | Freq: Three times a day (TID) | ORAL | 0 refills | Status: DC | PRN

## 2018-12-29 NOTE — Progress Notes (Signed)
Physical Therapy Treatment Patient Details Name: Kayla Gray MRN: [id number] DOB: 1936/09/29 Today's Date: 12/29/2018    History of Present Illness Patient is a 83 year old female presnting to the ED after being accidentally hit by her lawnmower. Patient with persistent dizziness since event. Patient with known PMH of anxiety/depression, hyperlipidemia, HTN    PT Comments    Pt with dizziness still with positional changes.  Cervical and lumbar spine screened.  Pt reporting mild neck and posterior head pain from fall at rest but did not increase with neck ROM.  Performed Dix-Hallpike test for vertigo bilaterally (nystagmus noted to each side) and performed Epley maneuver bilaterally; pt reporting no change in mild neck and posterior head pain during and after testing/treatment.  Post treatment pt asymptomatic and able to perform transfers and ambulation around nursing loop with RW initially and then pt progressing to no AD (pt initially appearing cautious d/t concerns of dizziness but improved confidence noted with increased distance ambulating and no dizziness/symptoms noted after vestibular treatment performed).  Pt educated on precautions (including positioning) s/p vestibular treatment and recommendations for OP vestibular PT if symptoms return (pt and pt's husband and pt's daughter all verbalizing good understanding; also discussed with CM).   Follow Up Recommendations  Outpatient PT(OP vestibular PT (if symptoms return))     Equipment Recommendations  Rolling walker with 5" wheels    Recommendations for Other Services       Precautions / Restrictions Precautions Precautions: Fall Restrictions Weight Bearing Restrictions: No    Mobility  Bed Mobility Overal bed mobility: Modified Independent Bed Mobility: Supine to Sit     Supine to sit: Modified independent (Device/Increase time)     General bed mobility comments: mild increased time to perform on  own  Transfers Overall transfer level: Needs assistance Equipment used: None Transfers: Sit to/from Stand Sit to Stand: Min guard         General transfer comment: CGA x2 first trial standing from bed; CGA standing from recliner  Ambulation/Gait Ambulation/Gait assistance: Min guard Gait Distance (Feet): (160 feet RW; 80 feet no AD) Assistive device: Rolling walker (2 wheeled)   Gait velocity: decreased   General Gait Details: pt initially appearing cautious with ambulation (using RW to improve confidence with balance) and pt limiting head movement but with increased distance ambulating pt able to perform normal head turns without any symptoms and progress to ambulating without AD/UE support (and no symptoms)   Stairs             Wheelchair Mobility    Modified Rankin (Stroke Patients Only)       Balance Overall balance assessment: Needs assistance Sitting-balance support: No upper extremity supported;Feet supported Sitting balance-Leahy Scale: Normal Sitting balance - Comments: steady sitting reaching outside BOS   Standing balance support: No upper extremity supported Standing balance-Leahy Scale: Normal Standing balance comment: steady standing reaching outside BOS (but appearing cautious)                            Cognition Arousal/Alertness: Awake/alert Behavior During Therapy: WFL for tasks assessed/performed Overall Cognitive Status: Within Functional Limits for tasks assessed                                        Exercises      General Comments   Nursing cleared pt for  participation in physical therapy (nurse reporting meclizine not given today yet).  Pt agreeable to PT session.  Pt's husband present during session and pt's daughter arrived during session.      Pertinent Vitals/Pain Pain Assessment: No/denies pain Pain Score: 2  Pain Location: mild posterior neck/head soreness Pain Intervention(s): Limited  activity within patient's tolerance;Monitored during session;Repositioned  Vitals (HR and O2 on room air) stable and WFL throughout treatment session.    Home Living                      Prior Function            PT Goals (current goals can now be found in the care plan section) Acute Rehab PT Goals Patient Stated Goal: return home PT Goal Formulation: With patient/family Time For Goal Achievement: 01/10/19 Progress towards PT goals: Progressing toward goals    Frequency    Min 2X/week      PT Plan Discharge plan needs to be updated    Co-evaluation              AM-PAC PT "6 Clicks" Mobility   Outcome Measure  Help needed turning from your back to your side while in a flat bed without using bedrails?: None Help needed moving from lying on your back to sitting on the side of a flat bed without using bedrails?: None Help needed moving to and from a bed to a chair (including a wheelchair)?: A Little Help needed standing up from a chair using your arms (e.g., wheelchair or bedside chair)?: A Little Help needed to walk in hospital room?: A Little Help needed climbing 3-5 steps with a railing? : A Little 6 Click Score: 20    End of Session Equipment Utilized During Treatment: Gait belt Activity Tolerance: Patient tolerated treatment well Patient left: in chair;with call bell/phone within reach;with chair alarm set;with family/visitor present Nurse Communication: Mobility status;Precautions PT Visit Diagnosis: Unsteadiness on feet (R26.81);Dizziness and giddiness (R42);Difficulty in walking, not elsewhere classified (R26.2);Repeated falls (R29.6);History of falling (Z91.81);Other symptoms and signs involving the nervous system (R29.898)     Time: 6168-3729 PT Time Calculation (min) (ACUTE ONLY): 54 min  Charges:  $Gait Training: 8-22 mins $Therapeutic Activity: 8-22 mins $[place]ith Rep Proc: 23-37 mins                     Leitha Bleak, PT 12/29/18,  11:24 AM [phone]

## 2018-12-29 NOTE — TOC Transition Note (Signed)
Transition of Care [facility]) - CM/SW Discharge Note   Patient Details  Name: Kayla Gray MRN: [id number] Date of Birth: Mar 16, 1936  Transition [facility] Care [facility] CM/SW Contact:  Shelbie Ammons, RN Phone Number: 12/29/2018, 12:27 PM   Clinical Narrative:   Will be discharged to home today per Dr. Anselm Jungling. Outpatient Vestibular rehabilitation now recommended. Order signed and faxed to rehab. Department Will  be going to family's member's home.     Final next level of care: (Outpatient vestipular rehab) Barriers to Discharge: No Barriers Identified   Patient Goals and CMS Choice Patient states their goals for this hospitalization and ongoing recovery are:: "I want to be able to get back to driving and doing things around the house like before." CMS Medicare.gov Compare Post Acute Care list provided to:: Patient Choice offered to / list presented to : Patient, [facility] POA / Guardian, Spouse  Discharge Placement   Existing PASRR number confirmed : 12/28/18                   Discharge Plan and Services Discharge Planning Services: Follow-up appt scheduled Post Acute Care Choice: [place]        DME Arranged: N/A DME Agency: NA HH Arranged: NA HH Agency: NA   Social Determinants of Health (SDOH) Interventions Alcohol Brief Interventions/Follow-up: AUDIT Score <7 follow-up not indicated   Readmission Risk Interventions Readmission Risk Prevention Plan 12/28/2018  Post Dischage Appt Not Complete  Appt Comments Specialist appointment will be set prior to discharge  Medication Screening Not Complete  Medication Screening Not Complete Comment Discharge medications will be assessed at time of discharge.  Transportation Screening Complete  Some recent data might be hidden

## 2019-01-05 NOTE — Discharge Summary (Signed)
[facility] NAME: Kayla [place]    MR#:  [id number]  DATE OF BIRTH:  [birth year]  DATE OF ADMISSION:  12/26/2018 ADMITTING PHYSICIAN: Harrie Foreman, MD  DATE OF DISCHARGE: 12/29/2018  3:04 PM  PRIMARY CARE PHYSICIAN: Derinda Late, MD    ADMISSION DIAGNOSIS:  Vertigo [R42] Hematoma [T14.8XXA] Closed head injury, initial encounter [S09.90XA]  DISCHARGE DIAGNOSIS:  Active Problems:   Unsteady gait   SECONDARY DIAGNOSIS:   Past Medical History:  Diagnosis Date  . Aortic stenosis   . Bilateral cataracts   . Bleeding nose    Thurs night (09/08/15) and Fri (09/09/15) left side.Bleeding didn't last long  . Breast cancer ([facility]) 1999   right breast lumpectomy and rad tx  . Chronic diastolic (congestive) heart failure ([place])   . Depression   . Diverticulosis   . Gastroesophageal reflux disease   . Heart murmur   . Hyperlipidemia   . Hypertension   . Iron deficiency anemia   . Obstructive sleep apnea   . Osteoarthritis   . Osteopenia   . Peptic ulcer disease   . Personal history of radiation therapy   . S/P aortic valve replacement with bioprosthetic valve 09/16/2015   23 mm Edwards Intuity Elite bioprosthetic tissue valve  . Stroke ([place])    11/14/13  . Supraventricular tachycardia ([place])   . Syncope and collapse   . Urinary tract infection     HOSPITAL COURSE:   1Mechanical fall with subsequent head injury and posterior scalp hematoma.  follow neuro checks every 4 hours for 24 hours. Pain management will be provided as needed. MRI of the head is negative for any internal injuries.  2. Vertigo.   placed on aspirin.  Brain MRI is negative. Carotid Doppler studies with age-appropriate blockages but not major where it requires interventions. She continued to feel dizzy with physical therapy and ambulation.  Neurologist suggested vestibular exercise and started on meclizine.  I have called physical  therapy for vestibular exercises. Felt much better after vestibular exercise.  3. Anxiety/depression. Her Paxil will be continued.  4. Hypertension.We will continue her Lopressor.  5. Dyslipidemia. continue her Zetia  6. DVT prophylaxis. She will be placed on SCDs for now we will hold off on Lovenox given her scalp hematoma   DISCHARGE CONDITIONS:   Stable.  CONSULTS OBTAINED:  Treatment Team:  Catarina Hartshorn, MD  DRUG ALLERGIES:   Allergies  Allergen Reactions  . Codeine Hives  . Evista [Raloxifene]   . Statins Other (See Comments)    Leg muscle cramps  . Sulfa Antibiotics Hives    DISCHARGE MEDICATIONS:   Allergies as of 12/29/2018      Reactions   Codeine Hives   Evista [raloxifene]    Statins Other (See Comments)   Leg muscle cramps   Sulfa Antibiotics Hives      Medication List    TAKE these medications   acetaminophen 325 MG tablet Commonly known as:  TYLENOL Take 650 mg by mouth every 6 (six) hours as needed.   aspirin EC 81 MG tablet Take 1 tablet (81 mg total) by mouth daily.   CALCIUM 600 + D PO Take by mouth 2 (two) times daily.   ezetimibe 10 MG tablet Commonly known as:  ZETIA Take 10 mg by mouth daily.   fluticasone 50 MCG/ACT nasal spray Commonly known as:  FLONASE Place 2 sprays into the nose daily as needed.   meclizine 12.5 MG  tablet Commonly known as:  ANTIVERT Take 1 tablet (12.5 mg total) by mouth 3 (three) times daily as needed for dizziness.   metoprolol tartrate 25 MG tablet Commonly known as:  LOPRESSOR TAKE 1 TABLET (25 MG TOTAL) BY MOUTH 2 (TWO) TIMES DAILY.   multivitamin tablet Take 1 tablet by mouth daily.   pantoprazole 40 MG tablet Commonly known as:  PROTONIX Take 40 mg by mouth daily as needed.   PARoxetine 20 MG tablet Commonly known as:  PAXIL Take 20 mg by mouth daily.        DISCHARGE INSTRUCTIONS:    Follow with PMD in 1-2 weeks.  If you experience worsening of your  admission symptoms, develop shortness of breath, life threatening emergency, suicidal or homicidal thoughts you must seek medical attention immediately by calling 911 or calling your MD immediately  if symptoms less severe.  You Must read complete instructions/literature along with all the possible adverse reactions/side effects for all the Medicines you take and that have been prescribed to you. Take any new Medicines after you have completely understood and accept all the possible adverse reactions/side effects.   Please note  You were cared for by a hospitalist during your hospital stay. If you have any questions about your discharge medications or the care you received while you were in the hospital after you are discharged, you can call the unit and asked to speak with the hospitalist on call if the hospitalist that took care of you is not available. Once you are discharged, your primary care physician will handle any further medical issues. Please note that NO REFILLS for any discharge medications will be authorized once you are discharged, as it is imperative that you return to your primary care physician (or establish a relationship with a primary care physician if you do not have one) for your aftercare needs so that they can reassess your need for medications and monitor your lab values.    Today   CHIEF COMPLAINT:   Chief Complaint  Patient presents with  . Head Injury    HISTORY OF PRESENT ILLNESS:  Kayla Gray  is a 83 y.o. female with a known history of multiple medical problems that will be mentioned below who presented to the emergency room with a concern of accidental fall on the back of her head with subsequent head injury.  The patient was trying to help her husband to pull the emergency cord for his riding mower when she lost her balance to run away from the more and fell.  She declined any injuries at that time.  She denied any syncope or presyncope prior or after her  fall.  No chest pain or dyspnea or palpitations.  No nausea or vomiting.  No dyspnea or cough or hemoptysis.  She developed vertigo in the emergency room with unsteady gait.  She denied any paresthesias or focal muscle weakness.  Upon presentation to the emergency room, blood pressure was 146/78 with a pulse of 58, respiratory 18 temperature 98.2 with pulse oximetry of 90% on room air.  Labs revealed anemia with hemoglobin of 10.9 hematocrit 34.5.  EKG showed mild sinus bradycardia with a rate of 59 with left axis deviation and Q waves in V1 and poor R wave progression..  Noncontrasted head CT scan revealed no acute intracranial normalities.  Showed posterior scalp contusion and ischemic microvascular disease with old infarcts as mentioned below in details.  The patient was given 1 g of p.o. Tylenol, 0.5 mg of  p.o. Ativan and Antivert 12.5 mg p.o. twice as well as Zofran 4 mg p.o.  She will be admitted to an observation medically monitored bed for further evaluation and management.   VITAL SIGNS:  Blood pressure (!) 156/80, pulse 65, temperature 98.2 F (36.8 C), temperature source Oral, resp. rate 18, height 5\' 2"  (1.575 m), weight 77.1 kg, SpO2 93 %.  I/O:  No intake or output data in the 24 hours ending 01/05/19 1055  PHYSICAL EXAMINATION:  GENERAL:  83 y.o.-year-old patient lying in the bed with no acute distress.  EYES: Pupils equal, round, reactive to light and accommodation. No scleral icterus. Extraocular muscles intact.  HEENT: Head atraumatic, normocephalic. Oropharynx and nasopharynx clear.  NECK:  Supple, no jugular venous distention. No thyroid enlargement, no tenderness.  LUNGS: Normal breath sounds bilaterally, no wheezing, rales,rhonchi or crepitation. No use of accessory muscles of respiration.  CARDIOVASCULAR: S1, S2 normal. No murmurs, rubs, or gallops.  ABDOMEN: Soft, non-tender, non-distended. Bowel sounds present. No organomegaly or mass.  EXTREMITIES: No pedal edema,  cyanosis, or clubbing.  NEUROLOGIC: Cranial nerves II through XII are intact. Muscle strength 5/5 in all extremities. Sensation intact. Gait not checked.  PSYCHIATRIC: The patient is alert and oriented x 3.  SKIN: No obvious rash, lesion, or ulcer.   DATA REVIEW:   CBC No results for input(s): WBC, HGB, HCT, PLT in the last 168 hours.  Chemistries  No results for input(s): NA, K, CL, CO2, GLUCOSE, BUN, CREATININE, CALCIUM, MG, AST, ALT, ALKPHOS, BILITOT in the last 168 hours.  Invalid input(s): GFRCGP  Cardiac Enzymes No results for input(s): TROPONINI in the last 168 hours.  Microbiology Results  Results for orders placed or performed during the hospital encounter of 09/09/15  Surgical pcr screen     Status: None   Collection Time: 09/09/15 12:37 PM  Result Value Ref Range Status   MRSA, PCR NEGATIVE NEGATIVE Final   Staphylococcus aureus NEGATIVE NEGATIVE Final    Comment:        The Xpert SA Assay (FDA approved for NASAL specimens in patients over 45 years of age), is one component of a comprehensive surveillance program.  Test performance has been validated by [facility] for patients greater than or equal to 101 year old. It is not intended to diagnose infection nor to guide or monitor treatment.     RADIOLOGY:  No results found.  EKG:   Orders placed or performed during the hospital encounter of 12/26/18  . ED EKG  . ED EKG  . EKG 12-Lead  . EKG 12-Lead  . EKG      Management plans discussed with the patient, family and they are in agreement.  CODE STATUS:  Code Status History    Date Active Date Inactive Code Status Order ID Comments User Context   12/27/2018 1546 12/29/2018 1903 DNR [id number]  Vaughan Basta, MD Inpatient   12/27/2018 0116 12/27/2018 1546 Full Code [id number]  Sidney Ace, Arvella Merles, MD ED   09/16/2015 1242 09/22/2015 1741 Full Code [id number]  Rexene Alberts, MD Inpatient   08/17/2015 0917 08/17/2015 1503 Full Code [id number]  Minna Merritts, MD Inpatient    Questions for Most Recent Historical Code Status (Order [id number])    Question Answer Comment   In the event of cardiac or respiratory ARREST Do not call a "code blue"    In the event of cardiac or respiratory ARREST Do not perform Intubation, CPR, defibrillation or ACLS    In the  event of cardiac or respiratory ARREST Use medication by any route, position, wound care, and other measures to relive pain and suffering. May use oxygen, suction and manual treatment of airway obstruction as needed for comfort.         Advance Directive Documentation     Most Recent Value  Type of Advance Directive  Healthcare Power of Attorney, Living will  Pre-existing out of facility DNR order (yellow form or pink MOST form)  -  "MOST" Form in Place?  -      TOTAL TIME TAKING CARE OF THIS PATIENT: 35 minutes.    Vaughan Basta M.D on 01/05/2019 at 10:55 AM  Between 7am to 6pm - Pager - [phone]  After 6pm go to [email or web address] - password [email or web address] [facility]  Office  [phone]  CC: Primary care physician; Derinda Late, MD   Note: This dictation was prepared with Dragon dictation along with smaller phrase technology. Any transcriptional errors that result from this process are unintentional.

## 2019-01-27 DIAGNOSIS — X32XXXA Exposure to sunlight, initial encounter: Secondary | ICD-10-CM | POA: Diagnosis not present

## 2019-01-27 DIAGNOSIS — E7800 Pure hypercholesterolemia, unspecified: Secondary | ICD-10-CM | POA: Diagnosis not present

## 2019-01-27 DIAGNOSIS — S0080XA Unspecified superficial injury of other part of head, initial encounter: Secondary | ICD-10-CM | POA: Diagnosis not present

## 2019-01-27 DIAGNOSIS — L570 Actinic keratosis: Secondary | ICD-10-CM | POA: Diagnosis not present

## 2019-01-27 DIAGNOSIS — M109 Gout, unspecified: Secondary | ICD-10-CM | POA: Diagnosis not present

## 2019-01-27 DIAGNOSIS — R5383 Other fatigue: Secondary | ICD-10-CM | POA: Diagnosis not present

## 2019-03-11 DIAGNOSIS — C44329 Squamous cell carcinoma of skin of other parts of face: Secondary | ICD-10-CM | POA: Diagnosis not present

## 2019-03-11 DIAGNOSIS — Z85828 Personal history of other malignant neoplasm of skin: Secondary | ICD-10-CM | POA: Diagnosis not present

## 2019-03-11 DIAGNOSIS — D485 Neoplasm of uncertain behavior of skin: Secondary | ICD-10-CM | POA: Diagnosis not present

## 2019-03-11 DIAGNOSIS — Z08 Encounter for follow-up examination after completed treatment for malignant neoplasm: Secondary | ICD-10-CM | POA: Diagnosis not present

## 2019-03-11 DIAGNOSIS — L570 Actinic keratosis: Secondary | ICD-10-CM | POA: Diagnosis not present

## 2019-03-11 DIAGNOSIS — X32XXXA Exposure to sunlight, initial encounter: Secondary | ICD-10-CM | POA: Diagnosis not present

## 2019-03-11 DIAGNOSIS — L821 Other seborrheic keratosis: Secondary | ICD-10-CM | POA: Diagnosis not present

## 2019-03-13 DIAGNOSIS — C44329 Squamous cell carcinoma of skin of other parts of face: Secondary | ICD-10-CM | POA: Diagnosis not present

## 2019-03-17 DIAGNOSIS — Z9841 Cataract extraction status, right eye: Secondary | ICD-10-CM | POA: Diagnosis not present

## 2019-03-17 DIAGNOSIS — H52223 Regular astigmatism, bilateral: Secondary | ICD-10-CM | POA: Diagnosis not present

## 2019-03-17 DIAGNOSIS — Z9842 Cataract extraction status, left eye: Secondary | ICD-10-CM | POA: Diagnosis not present

## 2019-04-14 DIAGNOSIS — K219 Gastro-esophageal reflux disease without esophagitis: Secondary | ICD-10-CM | POA: Diagnosis not present

## 2019-04-14 DIAGNOSIS — Z79899 Other long term (current) drug therapy: Secondary | ICD-10-CM | POA: Diagnosis not present

## 2019-04-14 DIAGNOSIS — E7800 Pure hypercholesterolemia, unspecified: Secondary | ICD-10-CM | POA: Diagnosis not present

## 2019-04-14 DIAGNOSIS — F419 Anxiety disorder, unspecified: Secondary | ICD-10-CM | POA: Diagnosis not present

## 2019-04-14 DIAGNOSIS — I10 Essential (primary) hypertension: Secondary | ICD-10-CM | POA: Diagnosis not present

## 2019-04-16 DIAGNOSIS — N390 Urinary tract infection, site not specified: Secondary | ICD-10-CM | POA: Diagnosis not present

## 2019-04-16 DIAGNOSIS — R300 Dysuria: Secondary | ICD-10-CM | POA: Diagnosis not present

## 2019-05-27 ENCOUNTER — Encounter: Payer: Self-pay | Admitting: Emergency Medicine

## 2019-05-27 ENCOUNTER — Inpatient Hospital Stay
Admission: EM | Admit: 2019-05-27 | Discharge: 2019-05-29 | DRG: 392 | Disposition: A | Discharge status: Home / Self Care | Payer: PPO | Attending: Internal Medicine | Admitting: Internal Medicine

## 2019-05-27 ENCOUNTER — Emergency Department: Payer: PPO

## 2019-05-27 ENCOUNTER — Other Ambulatory Visit: Payer: Self-pay

## 2019-05-27 DIAGNOSIS — Z885 Allergy status to narcotic agent status: Secondary | ICD-10-CM

## 2019-05-27 DIAGNOSIS — I350 Nonrheumatic aortic (valve) stenosis: Secondary | ICD-10-CM | POA: Diagnosis not present

## 2019-05-27 DIAGNOSIS — K5900 Constipation, unspecified: Secondary | ICD-10-CM | POA: Diagnosis not present

## 2019-05-27 DIAGNOSIS — R112 Nausea with vomiting, unspecified: Secondary | ICD-10-CM | POA: Diagnosis present

## 2019-05-27 DIAGNOSIS — M1990 Unspecified osteoarthritis, unspecified site: Secondary | ICD-10-CM | POA: Diagnosis not present

## 2019-05-27 DIAGNOSIS — Z66 Do not resuscitate: Secondary | ICD-10-CM | POA: Diagnosis present

## 2019-05-27 DIAGNOSIS — Z20828 Contact with and (suspected) exposure to other viral communicable diseases: Secondary | ICD-10-CM | POA: Diagnosis present

## 2019-05-27 DIAGNOSIS — K5732 Diverticulitis of large intestine without perforation or abscess without bleeding: Principal | ICD-10-CM | POA: Diagnosis present

## 2019-05-27 DIAGNOSIS — I959 Hypotension, unspecified: Secondary | ICD-10-CM | POA: Diagnosis not present

## 2019-05-27 DIAGNOSIS — Z953 Presence of xenogenic heart valve: Secondary | ICD-10-CM

## 2019-05-27 DIAGNOSIS — M8580 Other specified disorders of bone density and structure, unspecified site: Secondary | ICD-10-CM | POA: Diagnosis present

## 2019-05-27 DIAGNOSIS — I5032 Chronic diastolic (congestive) heart failure: Secondary | ICD-10-CM | POA: Diagnosis present

## 2019-05-27 DIAGNOSIS — Z8673 Personal history of transient ischemic attack (TIA), and cerebral infarction without residual deficits: Secondary | ICD-10-CM | POA: Diagnosis not present

## 2019-05-27 DIAGNOSIS — I110 Hypertensive heart disease with heart failure: Secondary | ICD-10-CM | POA: Diagnosis present

## 2019-05-27 DIAGNOSIS — Z8711 Personal history of peptic ulcer disease: Secondary | ICD-10-CM

## 2019-05-27 DIAGNOSIS — Z888 Allergy status to other drugs, medicaments and biological substances status: Secondary | ICD-10-CM | POA: Diagnosis not present

## 2019-05-27 DIAGNOSIS — Z803 Family history of malignant neoplasm of breast: Secondary | ICD-10-CM

## 2019-05-27 DIAGNOSIS — Z8249 Family history of ischemic heart disease and other diseases of the circulatory system: Secondary | ICD-10-CM | POA: Diagnosis not present

## 2019-05-27 DIAGNOSIS — Z8679 Personal history of other diseases of the circulatory system: Secondary | ICD-10-CM | POA: Diagnosis not present

## 2019-05-27 DIAGNOSIS — H269 Unspecified cataract: Secondary | ICD-10-CM | POA: Diagnosis not present

## 2019-05-27 DIAGNOSIS — Z79899 Other long term (current) drug therapy: Secondary | ICD-10-CM

## 2019-05-27 DIAGNOSIS — F329 Major depressive disorder, single episode, unspecified: Secondary | ICD-10-CM | POA: Diagnosis present

## 2019-05-27 DIAGNOSIS — Z7982 Long term (current) use of aspirin: Secondary | ICD-10-CM

## 2019-05-27 DIAGNOSIS — Z882 Allergy status to sulfonamides status: Secondary | ICD-10-CM

## 2019-05-27 DIAGNOSIS — K219 Gastro-esophageal reflux disease without esophagitis: Secondary | ICD-10-CM | POA: Diagnosis not present

## 2019-05-27 DIAGNOSIS — Z923 Personal history of irradiation: Secondary | ICD-10-CM | POA: Diagnosis not present

## 2019-05-27 DIAGNOSIS — G4733 Obstructive sleep apnea (adult) (pediatric): Secondary | ICD-10-CM | POA: Diagnosis present

## 2019-05-27 DIAGNOSIS — K5792 Diverticulitis of intestine, part unspecified, without perforation or abscess without bleeding: Secondary | ICD-10-CM | POA: Diagnosis present

## 2019-05-27 DIAGNOSIS — Z853 Personal history of malignant neoplasm of breast: Secondary | ICD-10-CM

## 2019-05-27 DIAGNOSIS — E785 Hyperlipidemia, unspecified: Secondary | ICD-10-CM | POA: Diagnosis not present

## 2019-05-27 DIAGNOSIS — D509 Iron deficiency anemia, unspecified: Secondary | ICD-10-CM | POA: Diagnosis present

## 2019-05-27 DIAGNOSIS — Z03818 Encounter for observation for suspected exposure to other biological agents ruled out: Secondary | ICD-10-CM | POA: Diagnosis not present

## 2019-05-27 LAB — COMPREHENSIVE METABOLIC PANEL
ALT: 14 U/L (ref 0–44)
AST: 25 U/L (ref 15–41)
Albumin: 3.7 g/dL (ref 3.5–5.0)
Alkaline Phosphatase: 55 U/L (ref 38–126)
Anion gap: 9 (ref 5–15)
BUN: 16 mg/dL (ref 8–23)
CO2: 23 mmol/L (ref 22–32)
Calcium: 9 mg/dL (ref 8.9–10.3)
Chloride: 105 mmol/L (ref 98–111)
Creatinine, Ser: 1.16 mg/dL — ABNORMAL HIGH (ref 0.44–1.00)
GFR calc Af Amer: 51 mL/min — ABNORMAL LOW (ref >60)
GFR calc non Af Amer: 44 mL/min — ABNORMAL LOW (ref >60)
Glucose, Bld: 111 mg/dL — ABNORMAL HIGH (ref 70–99)
Potassium: 3.8 mmol/L (ref 3.5–5.1)
Sodium: 137 mmol/L (ref 135–145)
Total Bilirubin: 0.6 mg/dL (ref 0.3–1.2)
Total Protein: 7.4 g/dL (ref 6.5–8.1)

## 2019-05-27 LAB — URINALYSIS, COMPLETE (UACMP) WITH MICROSCOPIC
Bacteria, UA: NONE SEEN
Bilirubin Urine: NEGATIVE
Glucose, UA: NEGATIVE mg/dL
Hgb urine dipstick: NEGATIVE
Ketones, ur: 5 mg/dL — AB
Leukocytes,Ua: NEGATIVE
Nitrite: NEGATIVE
Protein, ur: NEGATIVE mg/dL
Specific Gravity, Urine: 1.009 (ref 1.005–1.030)
pH: 5 (ref 5.0–8.0)

## 2019-05-27 LAB — CBC
HCT: 37.9 % (ref 36.0–46.0)
Hemoglobin: 12.3 g/dL (ref 12.0–15.0)
MCH: 28 pg (ref 26.0–34.0)
MCHC: 32.5 g/dL (ref 30.0–36.0)
MCV: 86.1 fL (ref 80.0–100.0)
Platelets: 124 10*3/uL — ABNORMAL LOW (ref 150–400)
RBC: 4.4 MIL/uL (ref 3.87–5.11)
RDW: 13.8 % (ref 11.5–15.5)
WBC: 11.1 10*3/uL — ABNORMAL HIGH (ref 4.0–10.5)
nRBC: 0 % (ref 0.0–0.2)

## 2019-05-27 LAB — SARS CORONAVIRUS 2 BY RT PCR (HOSPITAL ORDER, PERFORMED IN ~~LOC~~ HOSPITAL LAB): SARS Coronavirus 2: NEGATIVE

## 2019-05-27 LAB — LIPASE, BLOOD: Lipase: 24 U/L (ref 11–51)

## 2019-05-27 MED ORDER — ONDANSETRON HCL 4 MG/2ML IJ SOLN: 4.0000 mg | Freq: Four times a day (QID) | INTRAMUSCULAR | Status: DC | PRN

## 2019-05-27 MED ORDER — POLYETHYLENE GLYCOL 3350 17 G PO PACK: 17.0000 g | PACK | Freq: Every day | ORAL | Status: DC | PRN

## 2019-05-27 MED ORDER — CIPROFLOXACIN IN D5W 400 MG/200ML IV SOLN
400.0000 mg | Freq: Two times a day (BID) | INTRAVENOUS | Status: DC
  Administered 2019-05-28 – 2019-05-29 (×3): 400 mg via INTRAVENOUS
  Filled 2019-05-27 (×4): qty 200

## 2019-05-27 MED ORDER — DILTIAZEM HCL 30 MG PO TABS: 120.0000 mg | ORAL_TABLET | Freq: Two times a day (BID) | ORAL | Status: DC

## 2019-05-27 MED ORDER — CIPROFLOXACIN IN D5W 400 MG/200ML IV SOLN
400.0000 mg | Freq: Once | INTRAVENOUS | Status: AC
  Administered 2019-05-27: 400 mg via INTRAVENOUS
  Filled 2019-05-27: qty 200

## 2019-05-27 MED ORDER — CLOPIDOGREL BISULFATE 75 MG PO TABS
75.0000 mg | ORAL_TABLET | Freq: Every day | ORAL | Status: DC
  Filled 2019-05-27: qty 1

## 2019-05-27 MED ORDER — PAROXETINE HCL 20 MG PO TABS
20.0000 mg | ORAL_TABLET | Freq: Every day | ORAL | Status: DC
  Administered 2019-05-28 – 2019-05-29 (×2): 20 mg via ORAL
  Filled 2019-05-27 (×2): qty 1

## 2019-05-27 MED ORDER — ACETAMINOPHEN 650 MG RE SUPP: 650.0000 mg | Freq: Four times a day (QID) | RECTAL | Status: DC | PRN

## 2019-05-27 MED ORDER — IOHEXOL 300 MG/ML  SOLN
100.0000 mL | Freq: Once | INTRAMUSCULAR | Status: AC | PRN
  Administered 2019-05-27: 80 mL via INTRAVENOUS

## 2019-05-27 MED ORDER — KETOROLAC TROMETHAMINE 30 MG/ML IJ SOLN
15.0000 mg | Freq: Three times a day (TID) | INTRAMUSCULAR | Status: DC | PRN
  Administered 2019-05-28: 05:00:00 15 mg via INTRAVENOUS
  Filled 2019-05-27: qty 1

## 2019-05-27 MED ORDER — PRAVASTATIN SODIUM 20 MG PO TABS: 40.0000 mg | ORAL_TABLET | Freq: Every day | ORAL | Status: DC

## 2019-05-27 MED ORDER — SODIUM CHLORIDE 0.9 % IV BOLUS
500.0000 mL | Freq: Once | INTRAVENOUS | Status: AC
  Administered 2019-05-27: 500 mL via INTRAVENOUS

## 2019-05-27 MED ORDER — METOPROLOL TARTRATE 25 MG PO TABS
25.0000 mg | ORAL_TABLET | Freq: Two times a day (BID) | ORAL | Status: DC
  Administered 2019-05-28 – 2019-05-29 (×4): 25 mg via ORAL
  Filled 2019-05-27 (×4): qty 1

## 2019-05-27 MED ORDER — TRAZODONE HCL 50 MG PO TABS: 100.0000 mg | ORAL_TABLET | Freq: Every evening | ORAL | Status: DC | PRN

## 2019-05-27 MED ORDER — KETOROLAC TROMETHAMINE 30 MG/ML IJ SOLN
INTRAMUSCULAR | Status: AC
  Administered 2019-05-28: 05:00:00 15 mg via INTRAVENOUS
  Filled 2019-05-27: qty 1

## 2019-05-27 MED ORDER — ONDANSETRON HCL 4 MG PO TABS: 4.0000 mg | ORAL_TABLET | Freq: Four times a day (QID) | ORAL | Status: DC | PRN

## 2019-05-27 MED ORDER — SODIUM CHLORIDE 0.9 % IV SOLN
INTRAVENOUS | Status: DC
  Administered 2019-05-27 – 2019-05-29 (×3): via INTRAVENOUS

## 2019-05-27 MED ORDER — IOHEXOL 240 MG/ML SOLN
50.0000 mL | Freq: Once | INTRAMUSCULAR | Status: AC | PRN
  Administered 2019-05-27: 50 mL via ORAL

## 2019-05-27 MED ORDER — ASPIRIN EC 81 MG PO TBEC
81.0000 mg | DELAYED_RELEASE_TABLET | Freq: Every day | ORAL | Status: DC
  Administered 2019-05-28 – 2019-05-29 (×2): 81 mg via ORAL
  Filled 2019-05-27 (×2): qty 1

## 2019-05-27 MED ORDER — ACETAMINOPHEN 325 MG PO TABS
650.0000 mg | ORAL_TABLET | Freq: Four times a day (QID) | ORAL | Status: DC | PRN
  Administered 2019-05-29: 650 mg via ORAL
  Filled 2019-05-27: qty 2

## 2019-05-27 MED ORDER — PANTOPRAZOLE SODIUM 40 MG PO TBEC
40.0000 mg | DELAYED_RELEASE_TABLET | Freq: Every day | ORAL | Status: DC
  Administered 2019-05-28 – 2019-05-29 (×3): 40 mg via ORAL
  Filled 2019-05-27 (×3): qty 1

## 2019-05-27 MED ORDER — METRONIDAZOLE IN NACL 5-0.79 MG/ML-% IV SOLN
500.0000 mg | Freq: Once | INTRAVENOUS | Status: AC
  Administered 2019-05-27: 500 mg via INTRAVENOUS
  Filled 2019-05-27: qty 100

## 2019-05-27 MED ORDER — ENOXAPARIN SODIUM 40 MG/0.4ML ~~LOC~~ SOLN
40.0000 mg | SUBCUTANEOUS | Status: DC
  Administered 2019-05-28 (×2): 40 mg via SUBCUTANEOUS
  Filled 2019-05-27 (×2): qty 0.4

## 2019-05-27 MED ORDER — METRONIDAZOLE IN NACL 5-0.79 MG/ML-% IV SOLN
500.0000 mg | Freq: Three times a day (TID) | INTRAVENOUS | Status: DC
  Administered 2019-05-28 – 2019-05-29 (×4): 500 mg via INTRAVENOUS
  Filled 2019-05-27 (×7): qty 100

## 2019-05-27 MED ORDER — EZETIMIBE 10 MG PO TABS
10.0000 mg | ORAL_TABLET | Freq: Every day | ORAL | Status: DC
  Administered 2019-05-28 – 2019-05-29 (×2): 10 mg via ORAL
  Filled 2019-05-27 (×2): qty 1

## 2019-05-27 MED ORDER — KETOROLAC TROMETHAMINE 15 MG/ML IJ SOLN
15.0000 mg | Freq: Three times a day (TID) | INTRAMUSCULAR | Status: DC | PRN
  Administered 2019-05-27: 21:00:00 15 mg via INTRAVENOUS

## 2019-05-27 MED ORDER — PROPRANOLOL HCL 20 MG PO TABS: 20.0000 mg | ORAL_TABLET | Freq: Every day | ORAL | Status: DC

## 2019-05-27 NOTE — ED Notes (Signed)
ED TO INPATIENT HANDOFF REPORT  ED Nurse Name and Phone #: Dionisio David Name/Age/Gender Kayla Gray 83 y.o. female Room/Bed: ED07A/ED07A  Code Status   Code Status: Prior  Home/SNF/Other Home Patient oriented to: self, place, time and situation Is this baseline? Yes   Triage Complete: Triage complete  Chief Complaint constipation  Triage Note Pt in via POV, reports constipation and abdominal cramping x 2 days.  Pt has taken a laxative without any relief.  Pt with hx constipation.     Allergies Allergies  Allergen Reactions  . Evista [Raloxifene]   . Sulfa Antibiotics Hives  . Codeine Hives  . Statins Other (See Comments)    Leg muscle cramps    Level of Care/Admitting Diagnosis ED Disposition    ED Disposition Condition Comment   Admit  The patient appears reasonably stabilized for admission considering the current resources, flow, and capabilities available in the ED at this time, and I doubt any other [facility] requiring further screening and/or treatment in the ED prior to admission is  present.       B Medical/Surgery History Past Medical History:  Diagnosis Date  . Aortic stenosis   . Bilateral cataracts   . Bleeding nose    Thurs night (09/08/15) and Fri (09/09/15) left side.Bleeding didn't last long  . Breast cancer ([place]) 1999   right breast lumpectomy and rad tx  . Chronic diastolic (congestive) heart failure ([place])   . Depression   . Diverticulosis   . Gastroesophageal reflux disease   . Heart murmur   . Hyperlipidemia   . Hypertension   . Iron deficiency anemia   . Obstructive sleep apnea   . Osteoarthritis   . Osteopenia   . Peptic ulcer disease   . Personal history of radiation therapy   . S/P aortic valve replacement with bioprosthetic valve 09/16/2015   23 mm Edwards Intuity Elite bioprosthetic tissue valve  . Stroke ([place])    11/14/13  . Supraventricular tachycardia ([place])   . Syncope and collapse   . Urinary tract infection    Past  Surgical History:  Procedure Laterality Date  . AORTIC VALVE REPLACEMENT N/A 09/16/2015   Procedure: AORTIC VALVE REPLACEMENT (AVR);  Surgeon: Rexene Alberts, MD;  Location: [facility];  Service: Open Heart Surgery;  Laterality: N/A;  . BREAST BIOPSY Right 1999   breast ca radation  . BREAST EXCISIONAL BIOPSY Left yrs ago    benign  . BREAST LUMPECTOMY Right 1999   with radiation  . CARDIAC CATHETERIZATION N/A 08/17/2015   Procedure: Left Heart Cath and Coronary Angiography;  Surgeon: Minna Merritts, MD;  Location: [facility];  Service: Cardiovascular;  Laterality: N/A;  . CATARACT EXTRACTION, BILATERAL    . CHOLECYSTECTOMY    . JOINT REPLACEMENT Bilateral    knee replacement  . KNEE ARTHROSCOPY     right  . KNEE ARTHROSCOPY     left  . TEE WITHOUT CARDIOVERSION N/A 09/16/2015   Procedure: TRANSESOPHAGEAL ECHOCARDIOGRAM (TEE);  Surgeon: Rexene Alberts, MD;  Location: [facility];  Service: Open Heart Surgery;  Laterality: N/A;  . TONSILLECTOMY    . VAGINAL HYSTERECTOMY       A IV Location/Drains/Wounds Patient Lines/Drains/Airways Status   Active Line/Drains/Airways    Name:   Placement date:   Placement time:   Site:   Days:   Peripheral IV 12/28/18 Left;Posterior Forearm   12/28/18    1228    Forearm   150  Peripheral IV 05/27/19 Left Antecubital   05/27/19    1529    Antecubital   less than 1   External Urinary Catheter   12/27/18    1708    -   151          Intake/Output Last 24 hours No intake or output data in the 24 hours ending 05/27/19 1805  Labs/Imaging Results for orders placed or performed during the hospital encounter of 05/27/19 (from the past 48 hour(s))  Lipase, blood     Status: None   Collection Time: 05/27/19  1:09 PM  Result Value Ref Range   Lipase 24 11 - 51 U/L    Comment: Performed at [facility], [address] [place], [place] [address]  Comprehensive metabolic panel     Status: Abnormal   Collection Time: 05/27/19  1:09 PM   Result Value Ref Range   Sodium 137 135 - 145 mmol/L   Potassium 3.8 3.5 - 5.1 mmol/L   Chloride 105 98 - 111 mmol/L   CO2 23 22 - 32 mmol/L   Glucose, Bld 111 (H) 70 - 99 mg/dL   BUN 16 8 - 23 mg/dL   Creatinine, Ser 1.16 (H) 0.44 - 1.00 mg/dL   Calcium 9.0 8.9 - 10.3 mg/dL   Total Protein 7.4 6.5 - 8.1 g/dL   Albumin 3.7 3.5 - 5.0 g/dL   AST 25 15 - 41 U/L   ALT 14 0 - 44 U/L   Alkaline Phosphatase 55 38 - 126 U/L   Total Bilirubin 0.6 0.3 - 1.2 mg/dL   GFR calc non Af Amer 44 (L) >60 mL/min   GFR calc Af Amer 51 (L) >60 mL/min   Anion gap 9 5 - 15    Comment: Performed at [facility], [address] [place], [place] [address]  CBC     Status: Abnormal   Collection Time: 05/27/19  1:09 PM  Result Value Ref Range   WBC 11.1 (H) 4.0 - 10.5 K/uL   RBC 4.40 3.87 - 5.11 MIL/uL   Hemoglobin 12.3 12.0 - 15.0 g/dL   HCT 37.9 36.0 - 46.0 %   MCV 86.1 80.0 - 100.0 fL   MCH 28.0 26.0 - 34.0 pg   MCHC 32.5 30.0 - 36.0 g/dL   RDW 13.8 11.5 - 15.5 %   Platelets 124 (L) 150 - 400 K/uL   nRBC 0.0 0.0 - 0.2 %    Comment: Performed at [facility], [address] [place], [place] [address]  Urinalysis, Complete w Microscopic     Status: Abnormal   Collection Time: 05/27/19  5:16 PM  Result Value Ref Range   Color, Urine YELLOW (A) YELLOW   APPearance CLEAR (A) CLEAR   Specific Gravity, Urine 1.009 1.005 - 1.030   pH 5.0 5.0 - 8.0   Glucose, UA NEGATIVE NEGATIVE mg/dL   Hgb urine dipstick NEGATIVE NEGATIVE   Bilirubin Urine NEGATIVE NEGATIVE   Ketones, ur 5 (A) NEGATIVE mg/dL   Protein, ur NEGATIVE NEGATIVE mg/dL   Nitrite NEGATIVE NEGATIVE   Leukocytes,Ua NEGATIVE NEGATIVE   RBC / HPF 0-5 0 - 5 RBC/hpf   WBC, UA 0-5 0 - 5 WBC/hpf   Bacteria, UA NONE SEEN NONE SEEN   Squamous Epithelial / LPF 0-5 0 - 5   Mucus PRESENT    Hyaline Casts, UA PRESENT     Comment: Performed at [facility], [address], [place],  [address]  Dg Abdomen 1  View  Result Date: 05/27/2019 CLINICAL DATA:  Constipation EXAM: ABDOMEN - 1 VIEW COMPARISON:  CT 03/10/2017 FINDINGS: Partially visualized lower sternotomy. Lung bases are clear. Nonobstructed bowel-gas pattern with moderate stool. Probable phleboliths in the pelvis. Possible punctate stones overlying the left kidney. IMPRESSION: 1. Nonobstructed gas pattern with moderate stool 2. Possible punctate left kidney stones Electronically Signed   By: Donavan Foil M.D.   On: 05/27/2019 13:42   Ct Abdomen Pelvis W Contrast  Result Date: 05/27/2019 CLINICAL DATA:  Abdominal pain, diverticulitis suspected, constipation cramping for 2 days EXAM: CT ABDOMEN AND PELVIS WITH CONTRAST TECHNIQUE: Multidetector CT imaging of the abdomen and pelvis was performed using the standard protocol following bolus administration of intravenous contrast. CONTRAST:  21mL OMNIPAQUE IOHEXOL 300 MG/ML  SOLN COMPARISON:  Radiograph May 27, 2019, CT abdomen pelvis Mar 10, 2017 FINDINGS: Lower chest: Aortic valve replacement is noted. Normal cardiac size. Coronary calcifications are present. Atelectasis in the otherwise clear lung bases. Hepatobiliary: No focal liver abnormality is seen. Patient is post cholecystectomy. Slight prominence of the biliary tree likely related to reservoir effect. No calcified intraductal gallstones. Pancreas: Partial fatty replacement of the pancreas which is otherwise unremarkable. No pancreatic ductal dilatation or surrounding inflammatory changes. Spleen: Normal in size without focal abnormality. Adrenals/Urinary Tract: Adrenal glands are unremarkable. Kidneys are normal, without renal calculi, focal lesion, or hydronephrosis. There is mild circumferential bladder wall thickening greater than expected for the degree of underdistention. Stomach/Bowel: Small hiatal hernia. Duodenal sweep is unremarkable. Normal appearance of the small bowel. Appendix is surgically absent. Scattered colonic diverticula are  present throughout the right and left colon. There is focal pericolonic inflammation centered upon a culprit diverticulum near the rectosigmoid junction (axial 2/72) with circumferential mural thickening, and adjacent reactive phlegmonous change. No extraluminal gas or organized collection is identified. Vascular/Lymphatic: Atherosclerotic plaque within the normal caliber aorta. No suspicious or enlarged lymph nodes in the included lymphatic chains. Reproductive: Uterus is surgically absent. No concerning adnexal lesions. Other: Trace reactive free fluid in the pelvis. No other abdominopelvic free fluid or free gas. No organized abscess or collection. No bowel containing hernias. Musculoskeletal: Multilevel degenerative changes are present in the imaged portions of the spine. Grade 1 anterolisthesis L4 on 5 is similar to prior. Extensive facet hypertrophic changes and L4 pars defects are noted. IMPRESSION: 1. Acute uncomplicated diverticulitis near the rectosigmoid junction. No evidence of perforation or abscess formation. 2. Mild circumferential bladder wall thickening greater than expected for the degree of underdistention. Could reflect reactive inflammation though should correlate with urinalysis to exclude cystitis. 3. Aortic Atherosclerosis (ICD10-I70.0). Electronically Signed   By: Lovena Le M.D.   On: 05/27/2019 16:58    Pending Labs Unresulted Labs (From admission, onward)    Start     Ordered   05/27/19 1800  SARS Coronavirus 2 [facility] order, Performed in [facility] hospital lab) Nasopharyngeal Nasopharyngeal Swab  (Symptomatic/High Risk of Exposure/Tier 1 Patients Labs with Precautions)  Once,   STAT    Question Answer Comment  Is this test for diagnosis or screening Diagnosis of ill patient   Symptomatic for COVID-19 as defined by CDC Yes   Date of Symptom Onset 05/24/2019   Hospitalized for COVID-19 No   Admitted to ICU for COVID-19 No   Previously tested for COVID-19 No   Resident in  a congregate (group) care setting No   Employed in healthcare setting No   Pregnant No      05/27/19  1800          Vitals/Pain Today's Vitals   05/27/19 1245 05/27/19 1300 05/27/19 1501 05/27/19 1715  BP:  98/64 (!) 118/55 (!) 147/90  Pulse:  96 70 82  Resp:   16 17  Temp:  98.1 F (36.7 C)    TempSrc:  Oral    SpO2:  98% 95% 98%  Weight: 73.5 kg 73.5 kg    Height: 5\' 2"  (1.575 m) 5' 2.5" (1.588 m)    PainSc: 7        Isolation Precautions No active isolations  Medications Medications  ciprofloxacin (CIPRO) IVPB 400 mg (has no administration in time range)  metroNIDAZOLE (FLAGYL) IVPB 500 mg (has no administration in time range)  sodium chloride 0.9 % bolus 500 mL (0 mLs Intravenous Stopped 05/27/19 1715)  iohexol (OMNIPAQUE) 240 MG/ML injection 50 mL (50 mLs Oral Contrast Given 05/27/19 1553)  iohexol (OMNIPAQUE) 300 MG/ML solution 100 mL (80 mLs Intravenous Contrast Given 05/27/19 1627)    Mobility walks Low fall risk   Focused Assessments GI   R Recommendations: See Admitting Provider Note  Report given to:   Additional Notes:

## 2019-05-27 NOTE — ED Notes (Addendum)
Report called to debbie rn floor nurse 

## 2019-05-27 NOTE — ED Notes (Signed)
Pt eating ice cream.  NP in with pt.  Pt alert.  Pt waiting on admission bed.

## 2019-05-27 NOTE — ED Notes (Signed)
Pt resting with eyes closed.  Pt waiting on admission bed.

## 2019-05-27 NOTE — ED Provider Notes (Signed)
[facility] Emergency Department Provider Note ____________________________________________   First MD Initiated Contact with Patient 05/27/19 1511     (approximate)  I have reviewed the triage vital signs and the nursing notes.   HISTORY  Chief Complaint Abdominal Pain    HPI Kayla Gray is a 83 y.o. female with PMH as noted below who presents with lower abdominal pain over the last 3 days, gradual onset, persistent course, associated with constipation.  The patient states her last bowel movement was 3 days ago and was loose.  She has had some intermittent nausea and vomiting.  She denies fever or any respiratory symptoms.  She states that she had a cholecystectomy many years ago.  She also has previously had diverticulitis.  Past Medical History:  Diagnosis Date  . Aortic stenosis   . Bilateral cataracts   . Bleeding nose    Thurs night (09/08/15) and Fri (09/09/15) left side.Bleeding didn't last long  . Breast cancer ([place]) 1999   right breast lumpectomy and rad tx  . Chronic diastolic (congestive) heart failure ([place])   . Depression   . Diverticulosis   . Gastroesophageal reflux disease   . Heart murmur   . Hyperlipidemia   . Hypertension   . Iron deficiency anemia   . Obstructive sleep apnea   . Osteoarthritis   . Osteopenia   . Peptic ulcer disease   . Personal history of radiation therapy   . S/P aortic valve replacement with bioprosthetic valve 09/16/2015   23 mm Edwards Intuity Elite bioprosthetic tissue valve  . Stroke ([place])    11/14/13  . Supraventricular tachycardia ([place])   . Syncope and collapse   . Urinary tract infection     Patient Active Problem List   Diagnosis Date Noted  . Unsteady gait 12/27/2018  . Paroxysmal atrial fibrillation ([place]) 11/14/2015  . Encounter for therapeutic drug monitoring 09/29/2015  . Chronic diastolic (congestive) heart failure ([place])   . SOB (shortness of breath) 08/17/2015  . Angina pectoris  ([place]) 08/17/2015  . Low back pain 08/12/2015  . Sinus tachycardia 01/06/2014  . Osteoarthritis of both knees 01/06/2014  . Aortic valve stenosis 11/27/2012  . SVT (supraventricular tachycardia) ([place]) 11/27/2012  . Syncope 11/27/2012    Past Surgical History:  Procedure Laterality Date  . AORTIC VALVE REPLACEMENT N/A 09/16/2015   Procedure: AORTIC VALVE REPLACEMENT (AVR);  Surgeon: Rexene Alberts, MD;  Location: [facility];  Service: Open Heart Surgery;  Laterality: N/A;  . BREAST BIOPSY Right 1999   breast ca radation  . BREAST EXCISIONAL BIOPSY Left yrs ago    benign  . BREAST LUMPECTOMY Right 1999   with radiation  . CARDIAC CATHETERIZATION N/A 08/17/2015   Procedure: Left Heart Cath and Coronary Angiography;  Surgeon: Minna Merritts, MD;  Location: [facility];  Service: Cardiovascular;  Laterality: N/A;  . CATARACT EXTRACTION, BILATERAL    . CHOLECYSTECTOMY    . JOINT REPLACEMENT Bilateral    knee replacement  . KNEE ARTHROSCOPY     right  . KNEE ARTHROSCOPY     left  . TEE WITHOUT CARDIOVERSION N/A 09/16/2015   Procedure: TRANSESOPHAGEAL ECHOCARDIOGRAM (TEE);  Surgeon: Rexene Alberts, MD;  Location: [facility];  Service: Open Heart Surgery;  Laterality: N/A;  . TONSILLECTOMY    . VAGINAL HYSTERECTOMY      Prior to Admission medications   Medication Sig Start Date End Date Taking? Authorizing Provider  diltiazem (CARDIZEM) 120 MG tablet Take  120 mg by mouth daily. 06/25/12  Yes [provider]  propranolol (INDERAL) 20 MG tablet Take 20 mg by mouth daily. 01/06/14  Yes [provider]  acetaminophen (TYLENOL) 325 MG tablet Take 650 mg by mouth every 6 (six) hours as needed.    [provider]  aspirin EC 81 MG tablet Take 1 tablet (81 mg total) by mouth daily. 07/01/18   Minna Merritts, MD  Calcium Carbonate-Vitamin D (CALCIUM 600 + D PO) Take by mouth 2 (two) times daily.    [provider]  clopidogrel (PLAVIX) 75 MG tablet Take 75  mg by mouth daily.    [provider]  ezetimibe (ZETIA) 10 MG tablet Take 10 mg by mouth daily.    [provider]  fluticasone (FLONASE) 50 MCG/ACT nasal spray Place 2 sprays into the nose daily as needed.    [provider]  meclizine (ANTIVERT) 12.5 MG tablet Take 1 tablet (12.5 mg total) by mouth 3 (three) times daily as needed for dizziness. Patient not taking: Reported on 05/27/2019 12/29/18   Vaughan Basta, MD  metoprolol tartrate (LOPRESSOR) 25 MG tablet TAKE 1 TABLET (25 MG TOTAL) BY MOUTH 2 (TWO) TIMES DAILY. 08/25/18   Minna Merritts, MD  Multiple Vitamin (MULTIVITAMIN) tablet Take 1 tablet by mouth daily.    [provider]  pantoprazole (PROTONIX) 40 MG tablet Take 40 mg by mouth daily as needed.  07/04/14   [provider]  PARoxetine (PAXIL) 20 MG tablet Take 20 mg by mouth daily.    [provider]  pravastatin (PRAVACHOL) 40 MG tablet Take 40 mg by mouth daily.    [provider]    Allergies Evista [raloxifene], Sulfa antibiotics, Codeine, and Statins  Family History  Problem Relation Age of Onset  . Heart failure Mother   . Breast cancer Maternal Aunt     Social History Social History   Tobacco Use  . Smoking status: Never Smoker  . Smokeless tobacco: Never Used  Substance Use Topics  . Alcohol use: Yes  . Drug use: No    Review of Systems  Constitutional: No fever. Eyes: No redness. ENT: No sore throat. Cardiovascular: Denies chest pain. Respiratory: Denies shortness of breath. Gastrointestinal: Positive for nausea and vomiting. Genitourinary: Negative for dysuria.  Musculoskeletal: Negative for back pain. Skin: Negative for rash. Neurological: Negative for headache.   ____________________________________________   PHYSICAL EXAM:  VITAL SIGNS: ED Triage Vitals  Enc Vitals Group     BP 05/27/19 1300 98/64     Pulse Rate 05/27/19 1300 96     Resp 05/27/19 1501 16      Temp 05/27/19 1300 98.1 F (36.7 C)     Temp Source 05/27/19 1300 Oral     SpO2 05/27/19 1300 98 %     Weight 05/27/19 1245 162 lb (73.5 kg)     Height 05/27/19 1245 5\' 2"  (1.575 m)     Head Circumference --      Peak Flow --      Pain Score 05/27/19 1245 7     Pain Loc --      Pain Edu? --      Excl. in [place]? --     Constitutional: Alert and oriented.  Relatively well appearing for age and in no acute distress. Eyes: Conjunctivae are normal.  No scleral icterus. Head: Atraumatic. Nose: No congestion/rhinnorhea. Mouth/Throat: Mucous membranes are slightly dry.   Neck: Normal range of motion.  Cardiovascular: Normal  rate, regular rhythm. Good peripheral circulation. Respiratory: Normal respiratory effort.  No retractions. Gastrointestinal: Soft with moderate left lower quadrant tenderness. No distention.  Genitourinary: No flank tenderness. Musculoskeletal:  Extremities warm and well perfused.  Neurologic:  Normal speech and language. No gross focal neurologic deficits are appreciated.  Skin:  Skin is warm and dry. No rash noted. Psychiatric: Mood and affect are normal. Speech and behavior are normal.  ____________________________________________   LABS (all labs ordered are listed, but only abnormal results are displayed)  Labs Reviewed  COMPREHENSIVE METABOLIC PANEL - Abnormal; Notable for the following components:      Result Value   Glucose, Bld 111 (*)    Creatinine, Ser 1.16 (*)    GFR calc non Af Amer 44 (*)    GFR calc Af Amer 51 (*)    All other components within normal limits  CBC - Abnormal; Notable for the following components:   WBC 11.1 (*)    Platelets 124 (*)    All other components within normal limits  URINALYSIS, COMPLETE (UACMP) WITH MICROSCOPIC - Abnormal; Notable for the following components:   Color, Urine YELLOW (*)    APPearance CLEAR (*)    Ketones, ur 5 (*)    All other components within normal limits  SARS CORONAVIRUS 2 (HOSPITAL ORDER,  [facility])  LIPASE, BLOOD   ____________________________________________  EKG   ____________________________________________  RADIOLOGY  XR abdomen: Moderate stool burden with no acute abnormality CT abdomen: Diverticulitis with no evidence of abscess or perforation  ____________________________________________   PROCEDURES  Procedure(s) performed: No  Procedures  Critical Care performed: No ____________________________________________   INITIAL IMPRESSION / ASSESSMENT AND PLAN / ED COURSE  Pertinent labs & imaging results that were available during my care of the patient were reviewed by me and considered in my medical decision making (see chart for details).  83 year old female with PMH as noted above presents with lower abdominal pain over the last several days associated with nausea and vomiting.  Patient last had a bowel movement 3 days ago prior to the onset of the pain.  She denies any fever.  On exam she is relatively well-appearing.  Blood pressure was borderline low when she arrived, but her vital signs are currently normal.  She does have some lower abdominal tenderness, particularly on the left.  Differential includes diverticulitis, colitis, bowel obstruction, volvulus, or possible symptoms related to simple constipation, gastritis, or other benign etiology.  Abdominal x-ray is unrevealing.  We will obtain CT abdomen and labs.  ----------------------------------------- 7:28 PM on 05/27/2019 -----------------------------------------  CT shows findings consistent with diverticulitis, with no evidence of abscess or perforation.  Given that the patient has had vomiting and had a slightly low blood pressure I think it would be safest to admit her since she likely will not be able to tolerate oral antibiotics.  The patient agrees with this plan.  A COVID test was obtained and was negative.  I signed the patient out to the hospitalist.   _____________________________  Fay Records was evaluated in Emergency Department on 05/27/2019 for the symptoms described in the history of present illness. She was evaluated in the context of the global COVID-19 pandemic, which necessitated consideration that the patient might be at risk for infection with the SARS-CoV-2 virus that causes COVID-19. Institutional protocols and algorithms that pertain to the evaluation of patients at risk for COVID-19 are in a state of rapid change based on information released by regulatory bodies  including the CDC and federal and state organizations. These policies and algorithms were followed during the patient's care in the ED. ____________________________________________   FINAL CLINICAL IMPRESSION(S) / ED DIAGNOSES  Final diagnoses:  Diverticulitis      NEW MEDICATIONS STARTED DURING THIS VISIT:  New Prescriptions   No medications on file     Note:  This document was prepared using Dragon voice recognition software and may include unintentional dictation errors.    Arta Silence, MD 05/27/19 1929

## 2019-05-27 NOTE — Progress Notes (Signed)
Family Meeting Note  Advance Directive:yes  Today a meeting took place with the Patient.   The following clinical team members were present during this meeting:MD APN  The following were discussed:Patient's diagnosis: Acute abdominal pain secondary to acute diverticulitis chronic CHF, GERD, hypertension, hyperlipidemia, getting admitted to the hospital.  Treatment plan of care discussed with the patient   patient's progosis: Unable to determine and Goals for treatment: Full Code  Husband is the healthcare power of attorney  Additional follow-up to be provided: Hospitalist  Time spent during discussion:17MIN  Nicholes Mango, MD

## 2019-05-27 NOTE — ED Triage Notes (Signed)
Pt in via POV, reports constipation and abdominal cramping x 2 days.  Pt has taken a laxative without any relief.  Pt with hx constipation.

## 2019-05-27 NOTE — ED Notes (Signed)
Resumed care from [facility].  Pt alert  Pt waiting on admission.

## 2019-05-27 NOTE — ED Notes (Signed)
ED Provider at bedside. 

## 2019-05-28 ENCOUNTER — Other Ambulatory Visit: Payer: Self-pay | Admitting: Cardiovascular Disease

## 2019-05-28 ENCOUNTER — Other Ambulatory Visit: Payer: Self-pay

## 2019-05-28 LAB — CBC
HCT: 32.7 % — ABNORMAL LOW (ref 36.0–46.0)
Hemoglobin: 10.3 g/dL — ABNORMAL LOW (ref 12.0–15.0)
MCH: 27.5 pg (ref 26.0–34.0)
MCHC: 31.5 g/dL (ref 30.0–36.0)
MCV: 87.4 fL (ref 80.0–100.0)
Platelets: 113 10*3/uL — ABNORMAL LOW (ref 150–400)
RBC: 3.74 MIL/uL — ABNORMAL LOW (ref 3.87–5.11)
RDW: 13.7 % (ref 11.5–15.5)
WBC: 6.6 10*3/uL (ref 4.0–10.5)
nRBC: 0 % (ref 0.0–0.2)

## 2019-05-28 LAB — BASIC METABOLIC PANEL
Anion gap: 8 (ref 5–15)
BUN: 15 mg/dL (ref 8–23)
CO2: 25 mmol/L (ref 22–32)
Calcium: 8.3 mg/dL — ABNORMAL LOW (ref 8.9–10.3)
Chloride: 106 mmol/L (ref 98–111)
Creatinine, Ser: 0.96 mg/dL (ref 0.44–1.00)
GFR calc Af Amer: >60 mL/min (ref >60)
GFR calc non Af Amer: 56 mL/min — ABNORMAL LOW (ref >60)
Glucose, Bld: 124 mg/dL — ABNORMAL HIGH (ref 70–99)
Potassium: 3.4 mmol/L — ABNORMAL LOW (ref 3.5–5.1)
Sodium: 139 mmol/L (ref 135–145)

## 2019-05-28 LAB — TSH: TSH: 1.776 u[IU]/mL (ref 0.350–4.500)

## 2019-05-28 MED ORDER — POTASSIUM CHLORIDE CRYS ER 20 MEQ PO TBCR
40.0000 meq | EXTENDED_RELEASE_TABLET | Freq: Once | ORAL | Status: AC
  Administered 2019-05-28: 40 meq via ORAL
  Filled 2019-05-28: qty 2

## 2019-05-28 MED ORDER — TRAMADOL HCL 50 MG PO TABS: 50.0000 mg | ORAL_TABLET | Freq: Three times a day (TID) | ORAL | Status: DC | PRN

## 2019-05-28 NOTE — Progress Notes (Signed)
[facility] [place] NAME: Kayla Gray    MR#:  [id number]  DATE OF BIRTH:  1935/12/06  SUBJECTIVE:  CHIEF COMPLAINT:   Chief Complaint  Patient presents with  . Abdominal Pain  Patient seen and evaluated today Has decreased abdominal pain No nausea and vomiting No fever No chest pain  REVIEW OF SYSTEMS:    ROS  CONSTITUTIONAL: No documented fever. No fatigue, weakness. No weight gain, no weight loss.  EYES: No blurry or double vision.  ENT: No tinnitus. No postnasal drip. No redness of the oropharynx.  RESPIRATORY: No cough, no wheeze, no hemoptysis. No dyspnea.  CARDIOVASCULAR: No chest pain. No orthopnea. No palpitations. No syncope.  GASTROINTESTINAL: No nausea, no vomiting or diarrhea. Decreased abdominal pain. No melena or hematochezia.  GENITOURINARY: No dysuria or hematuria.  ENDOCRINE: No polyuria or nocturia. No heat or cold intolerance.  HEMATOLOGY: No anemia. No bruising. No bleeding.  INTEGUMENTARY: No rashes. No lesions.  MUSCULOSKELETAL: No arthritis. No swelling. No gout.  NEUROLOGIC: No numbness, tingling, or ataxia. No seizure-type activity.  PSYCHIATRIC: No anxiety. No insomnia. No ADD.   DRUG ALLERGIES:   Allergies  Allergen Reactions  . Evista [Raloxifene]   . Sulfa Antibiotics Hives  . Codeine Hives  . Nitrofurantoin Rash  . Statins Other (See Comments) and Rash    Leg muscle cramps    VITALS:  Blood pressure 140/81, pulse 73, temperature 98.2 F (36.8 C), temperature source Oral, resp. rate 15, height 5' 2.5" (1.588 m), weight 74.2 kg, SpO2 100 %.  PHYSICAL EXAMINATION:   Physical Exam  GENERAL:  83 y.o.-year-old patient lying in the bed with no acute distress.  EYES: Pupils equal, round, reactive to light and accommodation. No scleral icterus. Extraocular muscles intact.  HEENT: Head atraumatic, normocephalic. Oropharynx and nasopharynx clear.  NECK:  Supple, no jugular venous distention. No  thyroid enlargement, no tenderness.  LUNGS: Normal breath sounds bilaterally, no wheezing, rales, rhonchi. No use of accessory muscles of respiration.  CARDIOVASCULAR: S1, S2 normal. No murmurs, rubs, or gallops.  ABDOMEN: Soft, decreased tenderness around umbilicus, nondistended. Bowel sounds present. No organomegaly or mass.  EXTREMITIES: No cyanosis, clubbing or edema b/l.    NEUROLOGIC: Cranial nerves II through XII are intact. No focal Motor or sensory deficits b/l.   PSYCHIATRIC: The patient is alert and oriented x 3.  SKIN: No obvious rash, lesion, or ulcer.   LABORATORY PANEL:   CBC Recent Labs  Lab 05/28/19 0558  WBC 6.6  HGB 10.3*  HCT 32.7*  PLT 113*   ------------------------------------------------------------------------------------------------------------------ Chemistries  Recent Labs  Lab 05/27/19 1309 05/28/19 0558  NA 137 139  K 3.8 3.4*  CL 105 106  CO2 23 25  GLUCOSE 111* 124*  BUN 16 15  CREATININE 1.16* 0.96  CALCIUM 9.0 8.3*  AST 25  --   ALT 14  --   ALKPHOS 55  --   BILITOT 0.6  --    ------------------------------------------------------------------------------------------------------------------  Cardiac Enzymes No results for input(s): TROPONINI in the last 168 hours. ------------------------------------------------------------------------------------------------------------------  RADIOLOGY:  Dg Abdomen 1 View  Result Date: 05/27/2019 CLINICAL DATA:  Constipation EXAM: ABDOMEN - 1 VIEW COMPARISON:  CT 03/10/2017 FINDINGS: Partially visualized lower sternotomy. Lung bases are clear. Nonobstructed bowel-gas pattern with moderate stool. Probable phleboliths in the pelvis. Possible punctate stones overlying the left kidney. IMPRESSION: 1. Nonobstructed gas pattern with moderate stool 2. Possible punctate left kidney stones Electronically Signed   By: Maudie Mercury  Francoise Ceo M.D.   On: 05/27/2019 13:42   Ct Abdomen Pelvis W Contrast  Result Date:  05/27/2019 CLINICAL DATA:  Abdominal pain, diverticulitis suspected, constipation cramping for 2 days EXAM: CT ABDOMEN AND PELVIS WITH CONTRAST TECHNIQUE: Multidetector CT imaging of the abdomen and pelvis was performed using the standard protocol following bolus administration of intravenous contrast. CONTRAST:  65mL OMNIPAQUE IOHEXOL 300 MG/ML  SOLN COMPARISON:  Radiograph May 27, 2019, CT abdomen pelvis Mar 10, 2017 FINDINGS: Lower chest: Aortic valve replacement is noted. Normal cardiac size. Coronary calcifications are present. Atelectasis in the otherwise clear lung bases. Hepatobiliary: No focal liver abnormality is seen. Patient is post cholecystectomy. Slight prominence of the biliary tree likely related to reservoir effect. No calcified intraductal gallstones. Pancreas: Partial fatty replacement of the pancreas which is otherwise unremarkable. No pancreatic ductal dilatation or surrounding inflammatory changes. Spleen: Normal in size without focal abnormality. Adrenals/Urinary Tract: Adrenal glands are unremarkable. Kidneys are normal, without renal calculi, focal lesion, or hydronephrosis. There is mild circumferential bladder wall thickening greater than expected for the degree of underdistention. Stomach/Bowel: Small hiatal hernia. Duodenal sweep is unremarkable. Normal appearance of the small bowel. Appendix is surgically absent. Scattered colonic diverticula are present throughout the right and left colon. There is focal pericolonic inflammation centered upon a culprit diverticulum near the rectosigmoid junction (axial 2/72) with circumferential mural thickening, and adjacent reactive phlegmonous change. No extraluminal gas or organized collection is identified. Vascular/Lymphatic: Atherosclerotic plaque within the normal caliber aorta. No suspicious or enlarged lymph nodes in the included lymphatic chains. Reproductive: Uterus is surgically absent. No concerning adnexal lesions. Other: Trace  reactive free fluid in the pelvis. No other abdominopelvic free fluid or free gas. No organized abscess or collection. No bowel containing hernias. Musculoskeletal: Multilevel degenerative changes are present in the imaged portions of the spine. Grade 1 anterolisthesis L4 on 5 is similar to prior. Extensive facet hypertrophic changes and L4 pars defects are noted. IMPRESSION: 1. Acute uncomplicated diverticulitis near the rectosigmoid junction. No evidence of perforation or abscess formation. 2. Mild circumferential bladder wall thickening greater than expected for the degree of underdistention. Could reflect reactive inflammation though should correlate with urinalysis to exclude cystitis. 3. Aortic Atherosclerosis (ICD10-I70.0). Electronically Signed   By: Lovena Le M.D.   On: 05/27/2019 16:58     ASSESSMENT AND PLAN:  83 year old female patient with history of diverticulosis, hyperlipidemia, hypertension, iron deficiency anemia, aortic valve disease status post aortic valve replacement with bioprosthetic valve, GERD currently under hospitalist service for abdominal pain.  -Acute diverticulitis Sigmoid area Continue IV ciprofloxacin and Flagyl antibiotic IV fluids  -Nausea and vomiting Continue antiemetics IV  -Hyperlipidemia Continue statin medication  -History of aortic valve replacement With bioprosthetic valve Continue aspirin Plavix  -Diastolic chronic congestive heart failure Stable Continue aspirin Medical management  -Ambulatory dysfunction Physical therapy evaluation   All the records are reviewed and case discussed with Care Management/Social Worker. Management plans discussed with the patient, family and they are in agreement.  CODE STATUS: Full code  DVT Prophylaxis: SCDs  TOTAL TIME TAKING CARE OF THIS PATIENT: 36 minutes.   POSSIBLE D/C IN 1 to 2 DAYS, DEPENDING ON CLINICAL CONDITION.  Saundra Shelling M.D on 05/28/2019 at 10:42 AM  Between 7am to 6pm -  Pager - [phone]  After 6pm go to [email or web address] - password [email or web address] [place]  Office  [phone]  CC: Primary care physician; Derinda Late, MD  Note: This dictation was prepared with Dragon dictation along  with smaller phrase technology. Any transcriptional errors that result from this process are unintentional.

## 2019-05-28 NOTE — Evaluation (Signed)
Physical Therapy Evaluation Patient Details Name: Kayla Gray MRN: [id number] DOB: 09-16-1936 Today's Date: 05/28/2019   History of Present Illness  Pt is 83 year old female patient with history of diverticulosis, hyperlipidemia, hypertension, SVT, CHF, depression, iron deficiency anemia, aortic valve disease status post aortic valve replacement with bioprosthetic valve, GERD currently under hospitalist service for abdominal pain, pt reported nausea/vomiting. Abdominal CT demonstrates acute uncomplicated diverticulitis near the rectosigmoid junction with no evidence of perforation or abscess formation.    Clinical Impression  Patient alert, oriented, eating lunch with spouse in the room when PT entered. Pt stated she lives with her husband, 2 STE, previously independent in ADLs, ambulates without AD in home, will bring [facility] for community ambulation. Patient stated most often she "furniture walks" out of habit.  The patient demonstrated bed mobility mod I, transferred mod I without AD, and ambulated in room ~79ft. Pt exhibited decreased gait velocity, occasionally reaches for external support, but able to ambulate in room without physical/external assistance/support. No LOB or unsteadiness noted. Pt presented with some weakness of RLE but reported this is baseline for her due to a chronic issue. The patient demonstrated and reported return to baseline level of functioning, no further acute PT needs indicated. PT to sign off. Please reconsult PT if pt status changes or acute needs are identified.  Pt educated about potential use of SPC on days where she is having more difficulty/consistently furniture walking.     Follow Up Recommendations No PT follow up    Equipment Recommendations  None recommended by PT    Recommendations for Other Services       Precautions / Restrictions Precautions Precautions: None Restrictions Weight Bearing Restrictions: No      Mobility  Bed  Mobility Overal bed mobility: Needs Assistance Bed Mobility: Supine to Sit     Supine to sit: Modified independent (Device/Increase time);HOB elevated        Transfers Overall transfer level: Modified independent               General transfer comment: use of bed rails  Ambulation/Gait Ambulation/Gait assistance: Modified independent (Device/Increase time) Gait Distance (Feet): 20 Feet         General Gait Details: Pt with decreased gait velocity, occasionally reaches for external support, but able to ambulate in room without physical/external assistance/support. No LOB or unsteadiness noted.  Stairs            Wheelchair Mobility    Modified Rankin (Stroke Patients Only)       Balance Overall balance assessment: Needs assistance Sitting-balance support: Feet supported Sitting balance-Leahy Scale: Good       Standing balance-Leahy Scale: Good                               Pertinent Vitals/Pain Pain Assessment: No/denies pain    Home Living Family/patient expects to be discharged to:: Private residence Living Arrangements: Spouse/significant other Available Help at Discharge: Family Type of Home: House Home Access: Stairs to enter Entrance Stairs-Rails: Right Entrance Stairs-Number of Steps: 3 Home Layout: Able to live on main level with bedroom/bathroom Home Equipment: Walker - 2 wheels;Cane - single point;Toilet riser;Grab bars - tub/shower Additional Comments: Pt utilizes SPC in community and no AD at home    Prior Function Level of Independence: Independent               Hand Dominance   Dominant Hand: Right  Extremity/Trunk Assessment        Lower Extremity Assessment Lower Extremity Assessment: RLE deficits/detail;LLE deficits/detail RLE Deficits / Details: grossly 4-/5, pt with history of residual stroke LLE Deficits / Details: grosly 4+/5       Communication   Communication: No difficulties   Cognition Arousal/Alertness: Awake/alert Behavior During Therapy: WFL for tasks assessed/performed Overall Cognitive Status: Within Functional Limits for tasks assessed                                        General Comments      Exercises     Assessment/Plan    PT Assessment Patent does not need any further PT services  PT Problem List         PT Treatment Interventions      PT Goals (Current goals can be found in the Care Plan section)       Frequency     Barriers to discharge        Co-evaluation               AM-PAC PT "6 Clicks" Mobility  Outcome Measure Help needed turning from your back to your side while in a flat bed without using bedrails?: None Help needed moving from lying on your back to sitting on the side of a flat bed without using bedrails?: None Help needed moving to and from a bed to a chair (including a wheelchair)?: None Help needed standing up from a chair using your arms (e.g., wheelchair or bedside chair)?: None Help needed to walk in hospital room?: None Help needed climbing 3-5 steps with a railing? : None 6 Click Score: 24    End of Session Equipment Utilized During Treatment: Gait belt Activity Tolerance: Patient tolerated treatment well Patient left: in chair;with call bell/phone within reach;with family/visitor present Nurse Communication: Mobility status PT Visit Diagnosis: Other abnormalities of gait and mobility (R26.89)    Time: 3435-6861 PT Time Calculation (min) (ACUTE ONLY): 18 min   Charges:   PT Evaluation $PT Eval Low Complexity: 1 Low          Lieutenant Diego PT, DPT 1:38 PM,05/28/19 [phone]

## 2019-05-28 NOTE — H&P (Signed)
[place] [facility] [place] NAME: Kayla Gray    MR#:  [id number]  DATE OF BIRTH:  04-08-1936  DATE OF ADMISSION:  05/27/2019  PRIMARY CARE PHYSICIAN: Derinda Late, MD   REQUESTING/REFERRING PHYSICIAN: Noralee Stain, MD  CHIEF COMPLAINT:   Chief Complaint  Patient presents with   Abdominal Pain    HISTORY OF PRESENT ILLNESS:  Kayla Gray  is a 83 y.o. female with a known history of diverticulitis/diverticulosis, hyperlipidemia, hypertension, iron deficiency anemia, aortic valve disease status post aortic valve replacement with bioprosthetic valve, GERD.  She presented to the emergency room complaining of a 3-day history of lower abdominal pain with constipation.  She reports having had a loose and watery type bowel movement 3 days ago.  No melena or hematochezia.  She denies hematemesis.  Although, she has experienced intermittent periods of nausea and vomiting.  She denies fevers or chills.  She denies shortness of breath or cough.  She has a previous history of diverticulitis "many years ago ".  Abdominal CT demonstrates acute uncomplicated diverticulitis near the rectosigmoid junction with no evidence of perforation or abscess formation.  She has been admitted to the hospitalist service for further management.  Rapid COVID-19 testing is negative.  PAST MEDICAL HISTORY:   Past Medical History:  Diagnosis Date   Aortic stenosis    Bilateral cataracts    Bleeding nose    Thurs night (09/08/15) and Fri (09/09/15) left side.Bleeding didn't last long   Breast cancer ([place]) 1999   right breast lumpectomy and rad tx   Chronic diastolic (congestive) heart failure (HCC)    Depression    Diverticulosis    Gastroesophageal reflux disease    Heart murmur    Hyperlipidemia    Hypertension    Iron deficiency anemia    Obstructive sleep apnea    Osteoarthritis    Osteopenia    Peptic ulcer disease    Personal history  of radiation therapy    S/P aortic valve replacement with bioprosthetic valve 09/16/2015   23 mm Edwards Intuity Elite bioprosthetic tissue valve   Stroke ([place])    11/14/13   Supraventricular tachycardia (HCC)    Syncope and collapse    Urinary tract infection     PAST SURGICAL HISTORY:   Past Surgical History:  Procedure Laterality Date   AORTIC VALVE REPLACEMENT N/A 09/16/2015   Procedure: AORTIC VALVE REPLACEMENT (AVR);  Surgeon: Rexene Alberts, MD;  Location: [facility];  Service: Open Heart Surgery;  Laterality: N/A;   BREAST BIOPSY Right 1999   breast ca radation   BREAST EXCISIONAL BIOPSY Left yrs ago    benign   BREAST LUMPECTOMY Right 1999   with radiation   CARDIAC CATHETERIZATION N/A 08/17/2015   Procedure: Left Heart Cath and Coronary Angiography;  Surgeon: Minna Merritts, MD;  Location: [facility];  Service: Cardiovascular;  Laterality: N/A;   CATARACT EXTRACTION, BILATERAL     CHOLECYSTECTOMY     JOINT REPLACEMENT Bilateral    knee replacement   KNEE ARTHROSCOPY     right   KNEE ARTHROSCOPY     left   TEE WITHOUT CARDIOVERSION N/A 09/16/2015   Procedure: TRANSESOPHAGEAL ECHOCARDIOGRAM (TEE);  Surgeon: Rexene Alberts, MD;  Location: [facility];  Service: Open Heart Surgery;  Laterality: N/A;   TONSILLECTOMY     VAGINAL HYSTERECTOMY      SOCIAL HISTORY:   Social History   Tobacco Use   Smoking status:  Never Smoker   Smokeless tobacco: Never Used  Substance Use Topics   Alcohol use: Yes    FAMILY HISTORY:   Family History  Problem Relation Age of Onset   Heart failure Mother    Breast cancer Maternal Aunt     DRUG ALLERGIES:   Allergies  Allergen Reactions   Evista [Raloxifene]    Sulfa Antibiotics Hives   Codeine Hives   Nitrofurantoin Rash   Statins Other (See Comments) and Rash    Leg muscle cramps    REVIEW OF SYSTEMS:   Review of Systems  Constitutional: Positive for malaise/fatigue. Negative for chills  and fever.  HENT: Negative for congestion, sinus pain and sore throat.   Eyes: Negative for blurred vision and double vision.  Respiratory: Negative for cough, hemoptysis, shortness of breath and wheezing.   Cardiovascular: Negative for chest pain and palpitations.  Gastrointestinal: Positive for abdominal pain, nausea and vomiting. Negative for blood in stool, constipation, diarrhea and heartburn.  Genitourinary: Negative for dysuria, flank pain and hematuria.  Musculoskeletal: Negative for falls and myalgias.  Neurological: Negative for dizziness, focal weakness and headaches.  Psychiatric/Behavioral: Negative for depression.      MEDICATIONS AT HOME:   Prior to Admission medications   Medication Sig Start Date End Date Taking? Authorizing Provider  aspirin EC 81 MG tablet Take 1 tablet (81 mg total) by mouth daily. 07/01/18  Yes Gollan, [name] November, MD  Calcium Carbonate-Vitamin D (CALCIUM 600 + D PO) Take by mouth 2 (two) times daily.   Yes [provider]  ezetimibe (ZETIA) 10 MG tablet Take 10 mg by mouth daily.   Yes [provider]  metoprolol tartrate (LOPRESSOR) 25 MG tablet TAKE 1 TABLET (25 MG TOTAL) BY MOUTH 2 (TWO) TIMES DAILY. 08/25/18  Yes Minna Merritts, MD  Multiple Vitamin (MULTIVITAMIN) tablet Take 1 tablet by mouth daily.   Yes [provider]  PARoxetine (PAXIL) 20 MG tablet Take 20 mg by mouth daily.   Yes [provider]  acetaminophen (TYLENOL) 325 MG tablet Take 650 mg by mouth every 6 (six) hours as needed.    [provider]  clopidogrel (PLAVIX) 75 MG tablet Take 75 mg by mouth daily.    [provider]  diltiazem (CARDIZEM) 120 MG tablet Take 120 mg by mouth daily. 06/25/12   [provider]  fluticasone (FLONASE) 50 MCG/ACT nasal spray Place 2 sprays into the nose daily as needed.    [provider]  meclizine (ANTIVERT) 12.5 MG tablet Take 1 tablet (12.5 mg total) by mouth 3 (three)  times daily as needed for dizziness. Patient not taking: Reported on 05/27/2019 12/29/18   Vaughan Basta, MD  pantoprazole (PROTONIX) 40 MG tablet Take 40 mg by mouth daily as needed.  07/04/14   [provider]  pravastatin (PRAVACHOL) 40 MG tablet Take 40 mg by mouth daily.    [provider]  propranolol (INDERAL) 20 MG tablet Take 20 mg by mouth daily. 01/06/14   [provider]      VITAL SIGNS:  Blood pressure (!) 146/72, pulse 77, temperature 98.4 F (36.9 C), temperature source Oral, resp. rate 15, height 5' 2.5" (1.588 m), weight 73.5 kg, SpO2 97 %.  PHYSICAL EXAMINATION:  Physical Exam  GENERAL:  83 y.o.-year-old patient lying in the bed with no acute distress.  EYES: Pupils equal, round, reactive to light and accommodation. No scleral icterus. Extraocular muscles intact.  HEENT: Head atraumatic, normocephalic. Oropharynx and nasopharynx clear.  NECK:  Supple, no jugular venous distention. No thyroid enlargement, no tenderness.  LUNGS: Normal breath sounds bilaterally, no wheezing, rales,rhonchi or crepitation. No use of accessory muscles of respiration.  CARDIOVASCULAR: Regular rate and rhythm, S1, S2 normal. No murmurs, rubs, or gallops.  ABDOMEN: Soft, nondistended, with mild generalized abdominal tenderness. bowel sounds present. No organomegaly or mass.  EXTREMITIES: No pedal edema, cyanosis, or clubbing.  NEUROLOGIC: Cranial nerves II through XII are intact.  Generalized weakness sensation intact. Gait not checked.  PSYCHIATRIC: The patient is alert and oriented x 3.  Normal affect and good eye contact. SKIN: No obvious rash, lesion, or ulcer.   LABORATORY PANEL:   CBC Recent Labs  Lab 05/27/19 1309  WBC 11.1*  HGB 12.3  HCT 37.9  PLT 124*   ------------------------------------------------------------------------------------------------------------------  Chemistries  Recent Labs  Lab 05/27/19 1309  NA 137  K 3.8  CL 105    CO2 23  GLUCOSE 111*  BUN 16  CREATININE 1.16*  CALCIUM 9.0  AST 25  ALT 14  ALKPHOS 55  BILITOT 0.6   ------------------------------------------------------------------------------------------------------------------  Cardiac Enzymes No results for input(s): TROPONINI in the last 168 hours. ------------------------------------------------------------------------------------------------------------------  RADIOLOGY:  Dg Abdomen 1 View  Result Date: 05/27/2019 CLINICAL DATA:  Constipation EXAM: ABDOMEN - 1 VIEW COMPARISON:  CT 03/10/2017 FINDINGS: Partially visualized lower sternotomy. Lung bases are clear. Nonobstructed bowel-gas pattern with moderate stool. Probable phleboliths in the pelvis. Possible punctate stones overlying the left kidney. IMPRESSION: 1. Nonobstructed gas pattern with moderate stool 2. Possible punctate left kidney stones Electronically Signed   By: Donavan Foil M.D.   On: 05/27/2019 13:42   Ct Abdomen Pelvis W Contrast  Result Date: 05/27/2019 CLINICAL DATA:  Abdominal pain, diverticulitis suspected, constipation cramping for 2 days EXAM: CT ABDOMEN AND PELVIS WITH CONTRAST TECHNIQUE: Multidetector CT imaging of the abdomen and pelvis was performed using the standard protocol following bolus administration of intravenous contrast. CONTRAST:  16mL OMNIPAQUE IOHEXOL 300 MG/ML  SOLN COMPARISON:  Radiograph May 27, 2019, CT abdomen pelvis Mar 10, 2017 FINDINGS: Lower chest: Aortic valve replacement is noted. Normal cardiac size. Coronary calcifications are present. Atelectasis in the otherwise clear lung bases. Hepatobiliary: No focal liver abnormality is seen. Patient is post cholecystectomy. Slight prominence of the biliary tree likely related to reservoir effect. No calcified intraductal gallstones. Pancreas: Partial fatty replacement of the pancreas which is otherwise unremarkable. No pancreatic ductal dilatation or surrounding inflammatory changes. Spleen: Normal  in size without focal abnormality. Adrenals/Urinary Tract: Adrenal glands are unremarkable. Kidneys are normal, without renal calculi, focal lesion, or hydronephrosis. There is mild circumferential bladder wall thickening greater than expected for the degree of underdistention. Stomach/Bowel: Small hiatal hernia. Duodenal sweep is unremarkable. Normal appearance of the small bowel. Appendix is surgically absent. Scattered colonic diverticula are present throughout the right and left colon. There is focal pericolonic inflammation centered upon a culprit diverticulum near the rectosigmoid junction (axial 2/72) with circumferential mural thickening, and adjacent reactive phlegmonous change. No extraluminal gas or organized collection is identified. Vascular/Lymphatic: Atherosclerotic plaque within the normal caliber aorta. No suspicious or enlarged lymph nodes in the included lymphatic chains. Reproductive: Uterus is surgically absent. No concerning adnexal lesions. Other: Trace reactive free fluid in the pelvis. No other abdominopelvic free fluid or free gas. No organized abscess or collection. No bowel containing hernias. Musculoskeletal: Multilevel degenerative changes are present in the imaged portions of the spine. Grade 1 anterolisthesis L4 on 5 is similar to prior. Extensive facet hypertrophic changes  and L4 pars defects are noted. IMPRESSION: 1. Acute uncomplicated diverticulitis near the rectosigmoid junction. No evidence of perforation or abscess formation. 2. Mild circumferential bladder wall thickening greater than expected for the degree of underdistention. Could reflect reactive inflammation though should correlate with urinalysis to exclude cystitis. 3. Aortic Atherosclerosis (ICD10-I70.0). Electronically Signed   By: Lovena Le M.D.   On: 05/27/2019 16:58      IMPRESSION AND PLAN:   1. diverticulitis - IV ciprofloxacin and Flagyl -GI panel pending -Normal saline at 100 cc/h - IV  antiemetic  2.  Abdominal pain - IV analgesic  3.  Aortic valve disease with valve replacement - Aspirin and Plavix continued  4.  Hyperlipidemia -Statin therapy continued  5.  Diastolic CHF - Beta-blocker continued -Aspirin continued -Telemetry monitoring  DVT and PPI prophylaxis initiated    All the records are reviewed and case discussed with ED provider. The plan of care was discussed in details with the patient (and family). I answered all questions. The patient agreed to proceed with the above mentioned plan. Further management will depend upon hospital course.   CODE STATUS: Full code  TOTAL TIME TAKING CARE OF THIS PATIENT:45 minutes.    [place] on 05/28/2019 at 2:12 AM  Pager - [phone]  After 6pm go to [email or web address] - Proofreader  [facility]  Office  [phone]  CC: Primary care physician; Derinda Late, MD   Note: This dictation was prepared with Dragon dictation along with smaller phrase technology. Any transcriptional errors that result from this process are unintentional.

## 2019-05-29 LAB — CBC
HCT: 31.6 % — ABNORMAL LOW (ref 36.0–46.0)
Hemoglobin: 10.1 g/dL — ABNORMAL LOW (ref 12.0–15.0)
MCH: 27.7 pg (ref 26.0–34.0)
MCHC: 32 g/dL (ref 30.0–36.0)
MCV: 86.8 fL (ref 80.0–100.0)
Platelets: 134 10*3/uL — ABNORMAL LOW (ref 150–400)
RBC: 3.64 MIL/uL — ABNORMAL LOW (ref 3.87–5.11)
RDW: 13.8 % (ref 11.5–15.5)
WBC: 6 10*3/uL (ref 4.0–10.5)
nRBC: 0 % (ref 0.0–0.2)

## 2019-05-29 LAB — BASIC METABOLIC PANEL
Anion gap: 7 (ref 5–15)
BUN: 12 mg/dL (ref 8–23)
CO2: 23 mmol/L (ref 22–32)
Calcium: 8.3 mg/dL — ABNORMAL LOW (ref 8.9–10.3)
Chloride: 109 mmol/L (ref 98–111)
Creatinine, Ser: 0.87 mg/dL (ref 0.44–1.00)
GFR calc Af Amer: >60 mL/min (ref >60)
GFR calc non Af Amer: >60 mL/min (ref >60)
Glucose, Bld: 90 mg/dL (ref 70–99)
Potassium: 3.9 mmol/L (ref 3.5–5.1)
Sodium: 139 mmol/L (ref 135–145)

## 2019-05-29 MED ORDER — METRONIDAZOLE 500 MG PO TABS: 500.0000 mg | ORAL_TABLET | Freq: Three times a day (TID) | ORAL | 0 refills | Status: AC

## 2019-05-29 MED ORDER — POTASSIUM CHLORIDE CRYS ER 20 MEQ PO TBCR
40.0000 meq | EXTENDED_RELEASE_TABLET | Freq: Once | ORAL | Status: AC
  Administered 2019-05-29: 40 meq via ORAL
  Filled 2019-05-29: qty 2

## 2019-05-29 MED ORDER — CIPROFLOXACIN HCL 500 MG PO TABS: 500.0000 mg | ORAL_TABLET | Freq: Two times a day (BID) | ORAL | 0 refills | Status: AC

## 2019-05-29 NOTE — Progress Notes (Signed)
Pt for discharge home. Alert/ no distress. Sl d/cd.  purewick off earlier. Instructions to pt.medicare message form given to pt. meds / diet / activity and f/u discussed verbalize understanding.pt to call husband to pick her up.

## 2019-05-29 NOTE — Plan of Care (Signed)

## 2019-05-29 NOTE — Progress Notes (Signed)
Pt requested IV pump to be stopped because she felt like the noise was keeping her awake. Educated pt on the importance of prescribed antibiotics and fluids. Pt agreed to finnish Flagyl prior to stopping IV fluids and agreed fluids could be started and remain to run with hanging of 0600 antibiotic.

## 2019-05-29 NOTE — Discharge Summary (Signed)
[facility] NAME: Kayla Gray    MR#:  [id number]  DATE OF BIRTH:  07-Apr-1936  DATE OF ADMISSION:  05/27/2019 ADMITTING PHYSICIAN: Christel Mormon, MD  DATE OF DISCHARGE: 05/29/2019  PRIMARY CARE PHYSICIAN: Derinda Late, MD   ADMISSION DIAGNOSIS:  Diverticulitis [K57.92]  DISCHARGE DIAGNOSIS:  Active Problems:   Diverticulitis Nausea and vomiting Chronic diastolic heart failure SECONDARY DIAGNOSIS:   Past Medical History:  Diagnosis Date  . Aortic stenosis   . Bilateral cataracts   . Bleeding nose    Thurs night (09/08/15) and Fri (09/09/15) left side.Bleeding didn't last long  . Breast cancer ([place]) 1999   right breast lumpectomy and rad tx  . Chronic diastolic (congestive) heart failure ([place])   . Depression   . Diverticulosis   . Gastroesophageal reflux disease   . Heart murmur   . Hyperlipidemia   . Hypertension   . Iron deficiency anemia   . Obstructive sleep apnea   . Osteoarthritis   . Osteopenia   . Peptic ulcer disease   . Personal history of radiation therapy   . S/P aortic valve replacement with bioprosthetic valve 09/16/2015   23 mm Edwards Intuity Elite bioprosthetic tissue valve  . Stroke ([place])    11/14/13  . Supraventricular tachycardia ([place])   . Syncope and collapse   . Urinary tract infection      ADMITTING HISTORY Kayla Gray  is a 83 y.o. female with a known history of diverticulitis/diverticulosis, hyperlipidemia, hypertension, iron deficiency anemia, aortic valve disease status post aortic valve replacement with bioprosthetic valve, GERD.  She presented to the emergency room complaining of a 3-day history of lower abdominal pain with constipation.  She reports having had a loose and watery type bowel movement 3 days ago.  No melena or hematochezia.  She denies hematemesis.  Although, she has experienced intermittent periods of nausea and vomiting.  She denies fevers or chills.  She denies  shortness of breath or cough.  She has a previous history of diverticulitis "many years ago ".  Abdominal CT demonstrates acute uncomplicated diverticulitis near the rectosigmoid junction with no evidence of perforation or abscess formation.  She has been admitted to the hospitalist service for further management.  Rapid COVID-19 testing is negative.  HOSPITAL COURSE:  Patient was admitted to medical floor.  He was started on IV ciprofloxacin and Flagyl antibiotics.  Patient received IV fluids.  Antiemetics were given for nausea and vomiting.  Cultures did not reveal any growth.  COVID-19 test is negative.  Patient tolerated antibiotics well.  Abdominal pain completely resolved.  CONSULTS OBTAINED:    DRUG ALLERGIES:   Allergies  Allergen Reactions  . Evista [Raloxifene]   . Sulfa Antibiotics Hives  . Codeine Hives  . Nitrofurantoin Rash  . Statins Other (See Comments) and Rash    Leg muscle cramps    DISCHARGE MEDICATIONS:   Allergies as of 05/29/2019      Reactions   Evista [raloxifene]    Sulfa Antibiotics Hives   Codeine Hives   Nitrofurantoin Rash   Statins Other (See Comments), Rash   Leg muscle cramps      Medication List    STOP taking these medications   meclizine 12.5 MG tablet Commonly known as: ANTIVERT   propranolol 20 MG tablet Commonly known as: INDERAL     TAKE these medications   acetaminophen 325 MG tablet Commonly known as: TYLENOL Take 650 mg by mouth every  6 (six) hours as needed.   aspirin EC 81 MG tablet Take 1 tablet (81 mg total) by mouth daily.   CALCIUM 600 + D PO Take by mouth 2 (two) times daily.   ciprofloxacin 500 MG tablet Commonly known as: Cipro Take 1 tablet (500 mg total) by mouth 2 (two) times daily for 3 days.   clopidogrel 75 MG tablet Commonly known as: PLAVIX Take 75 mg by mouth daily. Notes to patient: Pt says she no longer takes this med   diltiazem 120 MG tablet Commonly known as: CARDIZEM Take 120 mg by mouth  daily.   ezetimibe 10 MG tablet Commonly known as: ZETIA Take 10 mg by mouth daily.   fluticasone 50 MCG/ACT nasal spray Commonly known as: FLONASE Place 2 sprays into the nose daily as needed.   metoprolol tartrate 25 MG tablet Commonly known as: LOPRESSOR TAKE 1 TABLET (25 MG TOTAL) BY MOUTH 2 (TWO) TIMES DAILY.   metroNIDAZOLE 500 MG tablet Commonly known as: Flagyl Take 1 tablet (500 mg total) by mouth 3 (three) times daily for 3 days.   multivitamin tablet Take 1 tablet by mouth daily.   pantoprazole 40 MG tablet Commonly known as: PROTONIX Take 40 mg by mouth daily as needed.   PARoxetine 20 MG tablet Commonly known as: PAXIL Take 20 mg by mouth daily.   pravastatin 40 MG tablet Commonly known as: PRAVACHOL Take 40 mg by mouth daily.       Today  Patient seen today No abdominal pain Tolerating diet well Hemodynamically stable VITAL SIGNS:  Blood pressure (!) 165/86, pulse 77, temperature 98.2 F (36.8 C), temperature source Oral, resp. rate 16, height 5' 2.5" (1.588 m), weight 74.2 kg, SpO2 100 %.  I/O:    Intake/Output Summary (Last 24 hours) at 05/29/2019 1059 Last data filed at 05/29/2019 0915 Gross per 24 hour  Intake 2750.06 ml  Output 400 ml  Net 2350.06 ml    PHYSICAL EXAMINATION:  Physical Exam  GENERAL:  83 y.o.-year-old patient lying in the bed with no acute distress.  LUNGS: Normal breath sounds bilaterally, no wheezing, rales,rhonchi or crepitation. No use of accessory muscles of respiration.  CARDIOVASCULAR: S1, S2 normal. No murmurs, rubs, or gallops.  ABDOMEN: Soft, non-tender, non-distended. Bowel sounds present. No organomegaly or mass.  NEUROLOGIC: Moves all 4 extremities. PSYCHIATRIC: The patient is alert and oriented x 3.  SKIN: No obvious rash, lesion, or ulcer.   DATA REVIEW:   CBC Recent Labs  Lab 05/29/19 0817  WBC 6.0  HGB 10.1*  HCT 31.6*  PLT 134*    Chemistries  Recent Labs  Lab 05/27/19 1309   05/29/19 0817  NA 137   < > 139  K 3.8   < > 3.9  CL 105   < > 109  CO2 23   < > 23  GLUCOSE 111*   < > 90  BUN 16   < > 12  CREATININE 1.16*   < > 0.87  CALCIUM 9.0   < > 8.3*  AST 25  --   --   ALT 14  --   --   ALKPHOS 55  --   --   BILITOT 0.6  --   --    < > = values in this interval not displayed.    Cardiac Enzymes No results for input(s): TROPONINI in the last 168 hours.  Microbiology Results  Results for orders placed or performed during the hospital encounter of 05/27/19  SARS Coronavirus 2 [facility] order, Performed in [facility] hospital lab) Nasopharyngeal Nasopharyngeal Swab     Status: None   Collection Time: 05/27/19  6:11 PM   Specimen: Nasopharyngeal Swab  Result Value Ref Range Status   SARS Coronavirus 2 NEGATIVE NEGATIVE Final    Comment: (NOTE) If result is NEGATIVE SARS-CoV-2 target nucleic acids are NOT DETECTED. The SARS-CoV-2 RNA is generally detectable in upper and lower  respiratory specimens during the acute phase of infection. The lowest  concentration of SARS-CoV-2 viral copies this assay can detect is 250  copies / mL. A negative result does not preclude SARS-CoV-2 infection  and should not be used as the sole basis for treatment or other  patient management decisions.  A negative result may occur with  improper specimen collection / handling, submission of specimen other  than nasopharyngeal swab, presence of viral mutation(s) within the  areas targeted by this assay, and inadequate number of viral copies  (<250 copies / mL). A negative result must be combined with clinical  observations, patient history, and epidemiological information. If result is POSITIVE SARS-CoV-2 target nucleic acids are DETECTED. The SARS-CoV-2 RNA is generally detectable in upper and lower  respiratory specimens dur ing the acute phase of infection.  Positive  results are indicative of active infection with SARS-CoV-2.  Clinical  correlation with patient  history and other diagnostic information is  necessary to determine patient infection status.  Positive results do  not rule out bacterial infection or co-infection with other viruses. If result is PRESUMPTIVE POSTIVE SARS-CoV-2 nucleic acids MAY BE PRESENT.   A presumptive positive result was obtained on the submitted specimen  and confirmed on repeat testing.  While 2019 novel coronavirus  (SARS-CoV-2) nucleic acids may be present in the submitted sample  additional confirmatory testing may be necessary for epidemiological  and / or clinical management purposes  to differentiate between  SARS-CoV-2 and other Sarbecovirus currently known to infect humans.  If clinically indicated additional testing with an alternate test  methodology [id number]) is advised. The SARS-CoV-2 RNA is generally  detectable in upper and lower respiratory sp ecimens during the acute  phase of infection. The expected result is Negative. Fact Sheet for Patients:  StrictlyIdeas.no Fact Sheet for Healthcare Providers: BankingDealers.co.za This test is not yet approved or cleared by the [place] FDA and has been authorized for detection and/or diagnosis of SARS-CoV-2 by FDA under an Emergency Use Authorization (EUA).  This EUA will remain in effect (meaning this test can be used) for the duration of the COVID-19 declaration under Section 564(b)(1) of the Act, 21 U.S.C. section 360bbb-3(b)(1), unless the authorization is terminated or revoked sooner. Performed at [facility], [address] [place], [place] [address]     RADIOLOGY:  Dg Abdomen 1 View  Result Date: 05/27/2019 CLINICAL DATA:  Constipation EXAM: ABDOMEN - 1 VIEW COMPARISON:  CT 03/10/2017 FINDINGS: Partially visualized lower sternotomy. Lung bases are clear. Nonobstructed bowel-gas pattern with moderate stool. Probable phleboliths in the pelvis. Possible punctate stones overlying the  left kidney. IMPRESSION: 1. Nonobstructed gas pattern with moderate stool 2. Possible punctate left kidney stones Electronically Signed   By: Donavan Foil M.D.   On: 05/27/2019 13:42   Ct Abdomen Pelvis W Contrast  Result Date: 05/27/2019 CLINICAL DATA:  Abdominal pain, diverticulitis suspected, constipation cramping for 2 days EXAM: CT ABDOMEN AND PELVIS WITH CONTRAST TECHNIQUE: Multidetector CT imaging of the abdomen and pelvis was performed using the standard protocol following bolus  administration of intravenous contrast. CONTRAST:  46mL OMNIPAQUE IOHEXOL 300 MG/ML  SOLN COMPARISON:  Radiograph May 27, 2019, CT abdomen pelvis Mar 10, 2017 FINDINGS: Lower chest: Aortic valve replacement is noted. Normal cardiac size. Coronary calcifications are present. Atelectasis in the otherwise clear lung bases. Hepatobiliary: No focal liver abnormality is seen. Patient is post cholecystectomy. Slight prominence of the biliary tree likely related to reservoir effect. No calcified intraductal gallstones. Pancreas: Partial fatty replacement of the pancreas which is otherwise unremarkable. No pancreatic ductal dilatation or surrounding inflammatory changes. Spleen: Normal in size without focal abnormality. Adrenals/Urinary Tract: Adrenal glands are unremarkable. Kidneys are normal, without renal calculi, focal lesion, or hydronephrosis. There is mild circumferential bladder wall thickening greater than expected for the degree of underdistention. Stomach/Bowel: Small hiatal hernia. Duodenal sweep is unremarkable. Normal appearance of the small bowel. Appendix is surgically absent. Scattered colonic diverticula are present throughout the right and left colon. There is focal pericolonic inflammation centered upon a culprit diverticulum near the rectosigmoid junction (axial 2/72) with circumferential mural thickening, and adjacent reactive phlegmonous change. No extraluminal gas or organized collection is identified.  Vascular/Lymphatic: Atherosclerotic plaque within the normal caliber aorta. No suspicious or enlarged lymph nodes in the included lymphatic chains. Reproductive: Uterus is surgically absent. No concerning adnexal lesions. Other: Trace reactive free fluid in the pelvis. No other abdominopelvic free fluid or free gas. No organized abscess or collection. No bowel containing hernias. Musculoskeletal: Multilevel degenerative changes are present in the imaged portions of the spine. Grade 1 anterolisthesis L4 on 5 is similar to prior. Extensive facet hypertrophic changes and L4 pars defects are noted. IMPRESSION: 1. Acute uncomplicated diverticulitis near the rectosigmoid junction. No evidence of perforation or abscess formation. 2. Mild circumferential bladder wall thickening greater than expected for the degree of underdistention. Could reflect reactive inflammation though should correlate with urinalysis to exclude cystitis. 3. Aortic Atherosclerosis (ICD10-I70.0). Electronically Signed   By: Lovena Le M.D.   On: 05/27/2019 16:58    Follow up with PCP in 1 week.  Management plans discussed with the patient, family and they are in agreement.  CODE STATUS:     Code Status Orders  (From admission, onward)         Start     Ordered   05/27/19 2340  Full code  Continuous     05/27/19 2339        Code Status History    Date Active Date Inactive Code Status Order ID Comments User Context   12/27/2018 1546 12/29/2018 1903 DNR [id number]  Vaughan Basta, MD Inpatient   12/27/2018 0116 12/27/2018 1546 Full Code [id number]  Mansy, Arvella Merles, MD ED   09/16/2015 1242 09/22/2015 1741 Full Code [id number]  Rexene Alberts, MD Inpatient   08/17/2015 0917 08/17/2015 1503 Full Code [id number]  Minna Merritts, MD Inpatient   Advance Care Planning Activity    Advance Directive Documentation     Most Recent Value  Type of Advance Directive  Healthcare Power of Attorney, Living will  Pre-existing out of  facility DNR order (yellow form or pink MOST form)  -  "MOST" Form in Place?  -      TOTAL TIME TAKING CARE OF THIS PATIENT ON DAY OF DISCHARGE: more than 35 minutes.   Saundra Shelling M.D on 05/29/2019 at 10:59 AM  Between 7am to 6pm - Pager - [phone]  After 6pm go to [email or web address] - password [email or web address] [facility]  Office  [phone]  CC:  Primary care physician; Derinda Late, MD  Note: This dictation was prepared with Dragon dictation along with smaller phrase technology. Any transcriptional errors that result from this process are unintentional.

## 2019-05-29 NOTE — Care Management Important Message (Signed)
Important Message  Patient Details  Name: Kayla Gray MRN: [id number] Date of Birth: 1936-08-10   Medicare Important Message Given:  Yes(RN gave IM to patient due to patient being on isolation.)     Jabir Dahlem, [place] Beets, Marlinda Mike 05/29/2019, 10:33 AM

## 2019-06-01 DIAGNOSIS — T466X5A Adverse effect of antihyperlipidemic and antiarteriosclerotic drugs, initial encounter: Secondary | ICD-10-CM | POA: Diagnosis not present

## 2019-06-01 DIAGNOSIS — I700 Atherosclerosis of aorta: Secondary | ICD-10-CM | POA: Diagnosis not present

## 2019-06-01 DIAGNOSIS — M791 Myalgia, unspecified site: Secondary | ICD-10-CM | POA: Diagnosis not present

## 2019-06-01 DIAGNOSIS — K5732 Diverticulitis of large intestine without perforation or abscess without bleeding: Secondary | ICD-10-CM | POA: Diagnosis not present

## 2019-07-09 ENCOUNTER — Other Ambulatory Visit: Payer: Self-pay | Admitting: Family Medicine

## 2019-07-09 DIAGNOSIS — Z1231 Encounter for screening mammogram for malignant neoplasm of breast: Secondary | ICD-10-CM

## 2019-08-18 DIAGNOSIS — C44729 Squamous cell carcinoma of skin of left lower limb, including hip: Secondary | ICD-10-CM | POA: Diagnosis not present

## 2019-08-18 DIAGNOSIS — L570 Actinic keratosis: Secondary | ICD-10-CM | POA: Diagnosis not present

## 2019-08-18 DIAGNOSIS — X32XXXA Exposure to sunlight, initial encounter: Secondary | ICD-10-CM | POA: Diagnosis not present

## 2019-08-18 DIAGNOSIS — Z85828 Personal history of other malignant neoplasm of skin: Secondary | ICD-10-CM | POA: Diagnosis not present

## 2019-08-18 DIAGNOSIS — L218 Other seborrheic dermatitis: Secondary | ICD-10-CM | POA: Diagnosis not present

## 2019-08-18 DIAGNOSIS — D485 Neoplasm of uncertain behavior of skin: Secondary | ICD-10-CM | POA: Diagnosis not present

## 2019-08-18 DIAGNOSIS — D0471 Carcinoma in situ of skin of right lower limb, including hip: Secondary | ICD-10-CM | POA: Diagnosis not present

## 2019-08-18 DIAGNOSIS — C44319 Basal cell carcinoma of skin of other parts of face: Secondary | ICD-10-CM | POA: Diagnosis not present

## 2019-08-18 DIAGNOSIS — Z08 Encounter for follow-up examination after completed treatment for malignant neoplasm: Secondary | ICD-10-CM | POA: Diagnosis not present

## 2019-08-18 DIAGNOSIS — C44622 Squamous cell carcinoma of skin of right upper limb, including shoulder: Secondary | ICD-10-CM | POA: Diagnosis not present

## 2019-08-20 ENCOUNTER — Ambulatory Visit
Admission: RE | Admit: 2019-08-20 | Discharge: 2019-08-20 | Disposition: A | Discharge status: Home / Self Care | Payer: PPO | Source: Ambulatory Visit | Attending: Family Medicine | Admitting: Family Medicine

## 2019-08-20 DIAGNOSIS — Z1231 Encounter for screening mammogram for malignant neoplasm of breast: Secondary | ICD-10-CM | POA: Diagnosis not present

## 2019-08-24 DIAGNOSIS — H0015 Chalazion left lower eyelid: Secondary | ICD-10-CM | POA: Diagnosis not present

## 2019-09-01 DIAGNOSIS — R300 Dysuria: Secondary | ICD-10-CM | POA: Diagnosis not present

## 2019-09-16 DIAGNOSIS — C44622 Squamous cell carcinoma of skin of right upper limb, including shoulder: Secondary | ICD-10-CM | POA: Diagnosis not present

## 2019-09-20 ENCOUNTER — Other Ambulatory Visit: Payer: Self-pay | Admitting: Cardiovascular Disease

## 2019-09-21 NOTE — Telephone Encounter (Signed)
Please schedule F/U appointment with Dr. Gollan. Thank you! 

## 2019-09-29 DIAGNOSIS — C44319 Basal cell carcinoma of skin of other parts of face: Secondary | ICD-10-CM | POA: Diagnosis not present

## 2019-10-06 DIAGNOSIS — C44729 Squamous cell carcinoma of skin of left lower limb, including hip: Secondary | ICD-10-CM | POA: Diagnosis not present

## 2019-10-13 DIAGNOSIS — E7800 Pure hypercholesterolemia, unspecified: Secondary | ICD-10-CM | POA: Diagnosis not present

## 2019-10-13 DIAGNOSIS — Z79899 Other long term (current) drug therapy: Secondary | ICD-10-CM | POA: Diagnosis not present

## 2019-10-19 DIAGNOSIS — Z1331 Encounter for screening for depression: Secondary | ICD-10-CM | POA: Diagnosis not present

## 2019-10-19 DIAGNOSIS — Z0000 Encounter for general adult medical examination without abnormal findings: Secondary | ICD-10-CM | POA: Diagnosis not present

## 2019-10-20 DIAGNOSIS — D0471 Carcinoma in situ of skin of right lower limb, including hip: Secondary | ICD-10-CM | POA: Diagnosis not present

## 2019-10-27 NOTE — Progress Notes (Signed)
Cardiology Office Note  Date:  10/28/2019   ID:  Kayla Gray, DOB [birth year], MRN [id number]  PCP:  Derinda Late, MD   Chief Complaint  Patient presents with  . office visit    12 mo F/U; Meds verbally reviewed with patient.    HPI:  [name] is a very pleasant 84 year old woman with history of  severe aortic valve stenosis, status post AVR with bioprosthetic valve 09/16/2015  postoperative atrial fibrillation, started on amiodarone, warfarin syncope (stumbled backwards, fell back hit head, 2013),  hyperlipidemia,  iron deficiency anemia, peptic ulcer disease Status post left total knee replacement 11/15/2011 SVT in February 2013 admitted  elevated troponin felt secondary to demand ischemia from tachycardia.  February 2015 with TIA-type symptoms, 30 day monitor did not show significant arrhythmia Cardiac cath 08/2015: no CAD She presents today for  Syncope, SVT and bioprosthetic aortic valve  In general no complaints on today's visit On asa, Metoprolol twice a day  Sleepy all the time  No dizziness Neck better  Walking "terrible", legs weak sedentary  prior falls,  No tachycardia  Lab work reviewed with her in detail Total chol up, 205 , on zetia With pravastatin  No Tia/CVA  EKG personally reviewed by myself on todays visit Shows normal sinus rhythm rate 75 bpm,  nonspecific ST abn  Other past medical history reviewed  October 20th, 2017  syncopal episode , out having supper at [employer]. Went to bathroom. Had abdominal pain, urgency, felt that she had to have a bowel movement.   lightheadedness, everything went white prior to having her bowel movement. Found herself on the floor, had released her bowels  emerg[facility]   brain CT scan was negative. Seen by neurology, had EEG performed   MRI  12/7  Did not participated in cardiac rehabilitation    postoperative atrial fibrillation, started on amiodarone, warfarin not had any  problems with her anticoagulation  Previous echocardiogram showing Peak velocity 510 cm/s, mean gradient 54 mmHg, peak gradient 104 mmHg.  She had knee replacement surgery early 2016  echocardiogram 01/ 8 2014 showed normal LV function, aortic valve velocity 315 cm/s, peak gradient 56 mm of mercury, mean gradient 19 mm of mercury, estimated aortic valve area 0.61 cm Echocardiogram in late 2014 showing velocity of 400    PMH:   has a past medical history of Aortic stenosis, Bilateral cataracts, Bleeding nose, Breast cancer ([place]) (1999), Chronic diastolic (congestive) heart failure ([place]), Depression, Diverticulosis, Gastroesophageal reflux disease, Heart murmur, Hyperlipidemia, Hypertension, Iron deficiency anemia, Obstructive sleep apnea, Osteoarthritis, Osteopenia, Peptic ulcer disease, Personal history of radiation therapy, S/P aortic valve replacement with bioprosthetic valve (09/16/2015), Skin cancer (2020), Stroke [facility]), Supraventricular tachycardia ([place]), Syncope and collapse, and Urinary tract infection.  PSH:    Past Surgical History:  Procedure Laterality Date  . AORTIC VALVE REPLACEMENT N/A 09/16/2015   Procedure: AORTIC VALVE REPLACEMENT (AVR);  Surgeon: Rexene Alberts, MD;  Location: [facility];  Service: Open Heart Surgery;  Laterality: N/A;  . BREAST BIOPSY Right 1999   breast ca radation  . BREAST EXCISIONAL BIOPSY Left yrs ago    benign  . BREAST LUMPECTOMY Right 1999   with radiation  . CARDIAC CATHETERIZATION N/A 08/17/2015   Procedure: Left Heart Cath and Coronary Angiography;  Surgeon: Minna Merritts, MD;  Location: [facility];  Service: Cardiovascular;  Laterality: N/A;  . CATARACT EXTRACTION, BILATERAL    . CHOLECYSTECTOMY    . JOINT REPLACEMENT Bilateral  knee replacement  . KNEE ARTHROSCOPY     right  . KNEE ARTHROSCOPY     left  . SKIN CANCER EXCISION  2020  . TEE WITHOUT CARDIOVERSION N/A 09/16/2015   Procedure: TRANSESOPHAGEAL ECHOCARDIOGRAM  (TEE);  Surgeon: Rexene Alberts, MD;  Location: [facility];  Service: Open Heart Surgery;  Laterality: N/A;  . TONSILLECTOMY    . VAGINAL HYSTERECTOMY      Current Outpatient Medications  Medication Sig Dispense Refill  . acetaminophen (TYLENOL) 325 MG tablet Take 650 mg by mouth every 6 (six) hours as needed.    Marland Kitchen aspirin EC 81 MG tablet Take 1 tablet (81 mg total) by mouth daily. 90 tablet 3  . Calcium Carbonate-Vitamin D (CALCIUM 600 + D PO) Take by mouth 2 (two) times daily.    Marland Kitchen ezetimibe (ZETIA) 10 MG tablet Take 10 mg by mouth daily.    . fluticasone (FLONASE) 50 MCG/ACT nasal spray Place 2 sprays into the nose daily as needed.    . metoprolol tartrate (LOPRESSOR) 25 MG tablet TAKE 1 TABLET (25 MG TOTAL) BY MOUTH 2 (TWO) TIMES DAILY. 180 tablet 0  . Multiple Vitamin (MULTIVITAMIN) tablet Take 1 tablet by mouth daily.    . pantoprazole (PROTONIX) 40 MG tablet Take 40 mg by mouth daily as needed.     Marland Kitchen PARoxetine (PAXIL) 20 MG tablet Take 20 mg by mouth daily.    . clopidogrel (PLAVIX) 75 MG tablet Take 75 mg by mouth daily.    Marland Kitchen diltiazem (CARDIZEM) 120 MG tablet Take 120 mg by mouth daily.    . pravastatin (PRAVACHOL) 40 MG tablet Take 40 mg by mouth daily.     No current facility-administered medications for this visit.     Allergies:   Evista [raloxifene], Sulfa antibiotics, Codeine, Nitrofurantoin, and Statins   Social History:  The patient  reports that she has never smoked. She has never used smokeless tobacco. She reports current alcohol use. She reports that she does not use drugs.   Family History:   family history includes Breast cancer in her maternal aunt; Heart failure in her mother.    Review of Systems: Review of Systems  Constitutional: Negative.   HENT: Negative.   Respiratory: Negative.   Cardiovascular: Negative.   Gastrointestinal: Negative.   Musculoskeletal: Negative.   Neurological: Negative.   Psychiatric/Behavioral: Negative.   All other systems  reviewed and are negative.   PHYSICAL EXAM: VS:  BP 120/80 (BP Location: Left Arm, Patient Position: Sitting, Cuff Size: Normal)   Pulse 75   Ht 5\' 2"  (1.575 m)   Wt 162 lb 12 oz (73.8 kg)   SpO2 95%   BMI 29.77 kg/m  , BMI Body mass index is 29.77 kg/m. Constitutional:  oriented to person, place, and time. No distress.  HENT:  Head: Grossly normal Eyes:  no discharge. No scleral icterus.  Neck: No JVD, no carotid bruits  Cardiovascular: Regular rate and rhythm, no murmurs appreciated Pulmonary/Chest: Clear to auscultation bilaterally, no wheezes or rails Abdominal: Soft.  no distension.  no tenderness.  Musculoskeletal: Normal range of motion Neurological:  normal muscle tone. Coordination normal. No atrophy Skin: Skin warm and dry Psychiatric: normal affect, pleasant  Recent Labs: 05/27/2019: ALT 14; TSH 1.776 05/29/2019: BUN 12; Creatinine, Ser 0.87; Hemoglobin 10.1; Platelets 134; Potassium 3.9; Sodium 139    Lipid Panel Lab Results  Component Value Date   CHOL 202 (H) 11/17/2013   HDL 41 11/17/2013   LDLCALC 129 (H)  11/17/2013   TRIG 159 11/17/2013      Wt Readings from Last 3 Encounters:  10/28/19 162 lb 12 oz (73.8 kg)  05/28/19 163 lb 8 oz (74.2 kg)  12/26/18 170 lb (77.1 kg)     ASSESSMENT AND PLAN:  SVT (supraventricular tachycardia) ([place]) - Plan: EKG 12-Lead Prior history of SVT.  No episodes  Paroxysmal atrial fibrillation ([facility]) - Plan: EKG 12-Lead Off warfarin Start aspirin 81 mg daily No atrial fib  Syncope, unspecified syncope type - Plan: EKG 12-Lead  vasovagal event in the setting of abdominal pain, bowel urgency She had syncope with defecation No further epsiodes  Severe aortic valve stenosis S/P aortic valve replacement with bioprosthetic valve Echo ordered   Total encounter time more than 25 minutes  Greater than 50% was spent in counseling and coordination of care with the patient  Disposition:   F/U  12 months   No orders of  the defined types were placed in this encounter.    Signed, Esmond Plants, M.D., Ph.D. 10/28/2019  [place], [place]

## 2019-10-28 ENCOUNTER — Encounter: Payer: Self-pay | Admitting: Cardiovascular Disease

## 2019-10-28 ENCOUNTER — Ambulatory Visit (INDEPENDENT_AMBULATORY_CARE_PROVIDER_SITE_OTHER): Payer: PPO | Admitting: Cardiovascular Disease

## 2019-10-28 ENCOUNTER — Other Ambulatory Visit: Payer: Self-pay

## 2019-10-28 VITALS — BP 120/80 | HR 75 | Ht 62.0 in | Wt 162.8 lb

## 2019-10-28 DIAGNOSIS — I471 Supraventricular tachycardia: Secondary | ICD-10-CM

## 2019-10-28 DIAGNOSIS — I6310 Cerebral infarction due to embolism of unspecified precerebral artery: Secondary | ICD-10-CM | POA: Diagnosis not present

## 2019-10-28 DIAGNOSIS — I350 Nonrheumatic aortic (valve) stenosis: Secondary | ICD-10-CM | POA: Diagnosis not present

## 2019-10-28 DIAGNOSIS — I480 Paroxysmal atrial fibrillation: Secondary | ICD-10-CM | POA: Diagnosis not present

## 2019-10-28 DIAGNOSIS — I5032 Chronic diastolic (congestive) heart failure: Secondary | ICD-10-CM

## 2019-10-28 DIAGNOSIS — Z953 Presence of xenogenic heart valve: Secondary | ICD-10-CM

## 2019-10-28 MED ORDER — AMOXICILLIN 500 MG PO TABS: 2000.0000 mg | ORAL_TABLET | ORAL | 1 refills | Status: DC

## 2019-10-28 NOTE — Patient Instructions (Addendum)
Medication Instructions:  Amoxicillin 500 mg take 4 pills 2 hours before surgery/dentist Script, with refill  If you need a refill on your cardiac medications before your next appointment, please call your pharmacy.    Lab work: No new labs needed   If you have labs (blood work) drawn today and your tests are completely normal, you will receive your results only by: Marland Kitchen MyChart Message (if you have MyChart) OR . A paper copy in the mail If you have any lab test that is abnormal or we need to change your treatment, we will call you to review the results.   Testing/Procedures: Echo for bioprosthetic aortic valve  Your physician has requested that you have an echocardiogram. Echocardiography is a painless test that uses sound waves to create images of your heart. It provides your doctor with information about the size and shape of your heart and how well your heart's chambers and valves are working. This procedure takes approximately one hour. There are no restrictions for this procedure.    Follow-Up: At [facility], you and your health needs are our priority.  As part of our continuing mission to provide you with exceptional heart care, we have created designated Provider Care Teams.  These Care Teams include your primary Cardiologist (physician) and Advanced Practice Providers (APPs -  Physician Assistants and Nurse Practitioners) who all work together to provide you with the care you need, when you need it.  . You will need a follow up appointment in 12 months   . Providers on your designated Care Team:   . Murray Hodgkins, NP . Christell Faith, PA-C . Marrianne Mood, PA-C  Any Other Special Instructions Will Be Listed Below (If Applicable).  For educational health videos Log in to : [email or web address] Or : [email or web address], password : [email or web address]

## 2019-10-30 NOTE — Addendum Note (Signed)
Addended by: Raelene Bott, Vaudine Dutan L on: 10/30/2019 12:57 PM   Modules accepted: Orders

## 2019-11-06 ENCOUNTER — Ambulatory Visit (INDEPENDENT_AMBULATORY_CARE_PROVIDER_SITE_OTHER): Payer: PPO

## 2019-11-06 ENCOUNTER — Other Ambulatory Visit: Payer: Self-pay

## 2019-11-06 DIAGNOSIS — Z953 Presence of xenogenic heart valve: Secondary | ICD-10-CM

## 2019-12-15 DIAGNOSIS — D485 Neoplasm of uncertain behavior of skin: Secondary | ICD-10-CM | POA: Diagnosis not present

## 2019-12-15 DIAGNOSIS — C44729 Squamous cell carcinoma of skin of left lower limb, including hip: Secondary | ICD-10-CM | POA: Diagnosis not present

## 2020-01-14 DIAGNOSIS — L988 Other specified disorders of the skin and subcutaneous tissue: Secondary | ICD-10-CM | POA: Diagnosis not present

## 2020-01-14 DIAGNOSIS — C44729 Squamous cell carcinoma of skin of left lower limb, including hip: Secondary | ICD-10-CM | POA: Diagnosis not present

## 2020-01-14 DIAGNOSIS — L578 Other skin changes due to chronic exposure to nonionizing radiation: Secondary | ICD-10-CM | POA: Diagnosis not present

## 2020-01-14 DIAGNOSIS — L905 Scar conditions and fibrosis of skin: Secondary | ICD-10-CM | POA: Diagnosis not present

## 2020-01-14 DIAGNOSIS — L814 Other melanin hyperpigmentation: Secondary | ICD-10-CM | POA: Diagnosis not present

## 2020-01-14 DIAGNOSIS — Z85828 Personal history of other malignant neoplasm of skin: Secondary | ICD-10-CM | POA: Diagnosis not present

## 2020-01-15 DIAGNOSIS — W0110XA Fall on same level from slipping, tripping and stumbling with subsequent striking against unspecified object, initial encounter: Secondary | ICD-10-CM | POA: Diagnosis not present

## 2020-01-15 DIAGNOSIS — S025XXA Fracture of tooth (traumatic), initial encounter for closed fracture: Secondary | ICD-10-CM | POA: Diagnosis not present

## 2020-01-18 DIAGNOSIS — W19XXXD Unspecified fall, subsequent encounter: Secondary | ICD-10-CM | POA: Diagnosis not present

## 2020-01-18 DIAGNOSIS — I10 Essential (primary) hypertension: Secondary | ICD-10-CM | POA: Diagnosis not present

## 2020-01-18 DIAGNOSIS — R5381 Other malaise: Secondary | ICD-10-CM | POA: Diagnosis not present

## 2020-01-18 DIAGNOSIS — R2681 Unsteadiness on feet: Secondary | ICD-10-CM | POA: Diagnosis not present

## 2020-01-27 DIAGNOSIS — R2681 Unsteadiness on feet: Secondary | ICD-10-CM | POA: Diagnosis not present

## 2020-01-27 DIAGNOSIS — M6281 Muscle weakness (generalized): Secondary | ICD-10-CM | POA: Diagnosis not present

## 2020-01-27 DIAGNOSIS — R2689 Other abnormalities of gait and mobility: Secondary | ICD-10-CM | POA: Diagnosis not present

## 2020-02-01 DIAGNOSIS — M6281 Muscle weakness (generalized): Secondary | ICD-10-CM | POA: Diagnosis not present

## 2020-02-01 DIAGNOSIS — R2681 Unsteadiness on feet: Secondary | ICD-10-CM | POA: Diagnosis not present

## 2020-02-01 DIAGNOSIS — R2689 Other abnormalities of gait and mobility: Secondary | ICD-10-CM | POA: Diagnosis not present

## 2020-02-03 DIAGNOSIS — M6281 Muscle weakness (generalized): Secondary | ICD-10-CM | POA: Diagnosis not present

## 2020-02-03 DIAGNOSIS — R2689 Other abnormalities of gait and mobility: Secondary | ICD-10-CM | POA: Diagnosis not present

## 2020-02-03 DIAGNOSIS — R2681 Unsteadiness on feet: Secondary | ICD-10-CM | POA: Diagnosis not present

## 2020-02-09 DIAGNOSIS — M6281 Muscle weakness (generalized): Secondary | ICD-10-CM | POA: Diagnosis not present

## 2020-02-09 DIAGNOSIS — R2689 Other abnormalities of gait and mobility: Secondary | ICD-10-CM | POA: Diagnosis not present

## 2020-02-09 DIAGNOSIS — R2681 Unsteadiness on feet: Secondary | ICD-10-CM | POA: Diagnosis not present

## 2020-02-11 DIAGNOSIS — R2689 Other abnormalities of gait and mobility: Secondary | ICD-10-CM | POA: Diagnosis not present

## 2020-02-11 DIAGNOSIS — M6281 Muscle weakness (generalized): Secondary | ICD-10-CM | POA: Diagnosis not present

## 2020-02-11 DIAGNOSIS — R2681 Unsteadiness on feet: Secondary | ICD-10-CM | POA: Diagnosis not present

## 2020-02-16 ENCOUNTER — Other Ambulatory Visit: Payer: Self-pay | Admitting: Cardiovascular Disease

## 2020-02-18 DIAGNOSIS — R2681 Unsteadiness on feet: Secondary | ICD-10-CM | POA: Diagnosis not present

## 2020-02-18 DIAGNOSIS — M6281 Muscle weakness (generalized): Secondary | ICD-10-CM | POA: Diagnosis not present

## 2020-02-18 DIAGNOSIS — R2689 Other abnormalities of gait and mobility: Secondary | ICD-10-CM | POA: Diagnosis not present

## 2020-02-22 DIAGNOSIS — X32XXXA Exposure to sunlight, initial encounter: Secondary | ICD-10-CM | POA: Diagnosis not present

## 2020-02-22 DIAGNOSIS — D2271 Melanocytic nevi of right lower limb, including hip: Secondary | ICD-10-CM | POA: Diagnosis not present

## 2020-02-22 DIAGNOSIS — C44612 Basal cell carcinoma of skin of right upper limb, including shoulder: Secondary | ICD-10-CM | POA: Diagnosis not present

## 2020-02-22 DIAGNOSIS — Z85828 Personal history of other malignant neoplasm of skin: Secondary | ICD-10-CM | POA: Diagnosis not present

## 2020-02-22 DIAGNOSIS — D2262 Melanocytic nevi of left upper limb, including shoulder: Secondary | ICD-10-CM | POA: Diagnosis not present

## 2020-02-22 DIAGNOSIS — D225 Melanocytic nevi of trunk: Secondary | ICD-10-CM | POA: Diagnosis not present

## 2020-02-22 DIAGNOSIS — D485 Neoplasm of uncertain behavior of skin: Secondary | ICD-10-CM | POA: Diagnosis not present

## 2020-02-22 DIAGNOSIS — L570 Actinic keratosis: Secondary | ICD-10-CM | POA: Diagnosis not present

## 2020-02-22 DIAGNOSIS — L821 Other seborrheic keratosis: Secondary | ICD-10-CM | POA: Diagnosis not present

## 2020-02-22 DIAGNOSIS — D2261 Melanocytic nevi of right upper limb, including shoulder: Secondary | ICD-10-CM | POA: Diagnosis not present

## 2020-02-23 DIAGNOSIS — M6281 Muscle weakness (generalized): Secondary | ICD-10-CM | POA: Diagnosis not present

## 2020-02-23 DIAGNOSIS — R2689 Other abnormalities of gait and mobility: Secondary | ICD-10-CM | POA: Diagnosis not present

## 2020-02-23 DIAGNOSIS — R2681 Unsteadiness on feet: Secondary | ICD-10-CM | POA: Diagnosis not present

## 2020-03-16 DIAGNOSIS — C44612 Basal cell carcinoma of skin of right upper limb, including shoulder: Secondary | ICD-10-CM | POA: Diagnosis not present

## 2020-04-12 DIAGNOSIS — N1831 Chronic kidney disease, stage 3a: Secondary | ICD-10-CM | POA: Diagnosis not present

## 2020-04-12 DIAGNOSIS — E7800 Pure hypercholesterolemia, unspecified: Secondary | ICD-10-CM | POA: Diagnosis not present

## 2020-04-19 DIAGNOSIS — Z79899 Other long term (current) drug therapy: Secondary | ICD-10-CM | POA: Diagnosis not present

## 2020-04-19 DIAGNOSIS — N1831 Chronic kidney disease, stage 3a: Secondary | ICD-10-CM | POA: Diagnosis not present

## 2020-04-19 DIAGNOSIS — F411 Generalized anxiety disorder: Secondary | ICD-10-CM | POA: Diagnosis not present

## 2020-04-19 DIAGNOSIS — I700 Atherosclerosis of aorta: Secondary | ICD-10-CM | POA: Diagnosis not present

## 2020-04-19 DIAGNOSIS — E7800 Pure hypercholesterolemia, unspecified: Secondary | ICD-10-CM | POA: Diagnosis not present

## 2020-04-19 DIAGNOSIS — I10 Essential (primary) hypertension: Secondary | ICD-10-CM | POA: Diagnosis not present

## 2020-07-13 DIAGNOSIS — S90862A Insect bite (nonvenomous), left foot, initial encounter: Secondary | ICD-10-CM | POA: Diagnosis not present

## 2020-07-13 DIAGNOSIS — W57XXXA Bitten or stung by nonvenomous insect and other nonvenomous arthropods, initial encounter: Secondary | ICD-10-CM | POA: Diagnosis not present

## 2020-07-13 DIAGNOSIS — S90861A Insect bite (nonvenomous), right foot, initial encounter: Secondary | ICD-10-CM | POA: Diagnosis not present

## 2020-07-13 DIAGNOSIS — S80862A Insect bite (nonvenomous), left lower leg, initial encounter: Secondary | ICD-10-CM | POA: Diagnosis not present

## 2020-07-13 DIAGNOSIS — S80861A Insect bite (nonvenomous), right lower leg, initial encounter: Secondary | ICD-10-CM | POA: Diagnosis not present

## 2020-07-14 ENCOUNTER — Other Ambulatory Visit: Payer: Self-pay | Admitting: Otolaryngology

## 2020-07-14 DIAGNOSIS — R221 Localized swelling, mass and lump, neck: Secondary | ICD-10-CM

## 2020-07-14 DIAGNOSIS — R590 Localized enlarged lymph nodes: Secondary | ICD-10-CM | POA: Diagnosis not present

## 2020-07-14 DIAGNOSIS — H6123 Impacted cerumen, bilateral: Secondary | ICD-10-CM | POA: Diagnosis not present

## 2020-07-19 ENCOUNTER — Other Ambulatory Visit: Payer: Self-pay | Admitting: Family Medicine

## 2020-07-19 DIAGNOSIS — Z1231 Encounter for screening mammogram for malignant neoplasm of breast: Secondary | ICD-10-CM

## 2020-07-21 ENCOUNTER — Other Ambulatory Visit: Payer: Self-pay

## 2020-07-21 ENCOUNTER — Ambulatory Visit
Admission: RE | Admit: 2020-07-21 | Discharge: 2020-07-21 | Disposition: A | Discharge status: Home / Self Care | Payer: PPO | Source: Ambulatory Visit | Attending: Otolaryngology | Admitting: Otolaryngology

## 2020-07-21 DIAGNOSIS — E041 Nontoxic single thyroid nodule: Secondary | ICD-10-CM | POA: Diagnosis not present

## 2020-07-21 DIAGNOSIS — R221 Localized swelling, mass and lump, neck: Secondary | ICD-10-CM | POA: Diagnosis not present

## 2020-07-27 DIAGNOSIS — L218 Other seborrheic dermatitis: Secondary | ICD-10-CM | POA: Diagnosis not present

## 2020-07-27 DIAGNOSIS — Z85828 Personal history of other malignant neoplasm of skin: Secondary | ICD-10-CM | POA: Diagnosis not present

## 2020-07-27 DIAGNOSIS — Z08 Encounter for follow-up examination after completed treatment for malignant neoplasm: Secondary | ICD-10-CM | POA: Diagnosis not present

## 2020-07-27 DIAGNOSIS — X32XXXA Exposure to sunlight, initial encounter: Secondary | ICD-10-CM | POA: Diagnosis not present

## 2020-07-27 DIAGNOSIS — L570 Actinic keratosis: Secondary | ICD-10-CM | POA: Diagnosis not present

## 2020-07-27 DIAGNOSIS — L821 Other seborrheic keratosis: Secondary | ICD-10-CM | POA: Diagnosis not present

## 2020-08-22 ENCOUNTER — Other Ambulatory Visit: Payer: Self-pay

## 2020-08-22 ENCOUNTER — Ambulatory Visit
Admission: RE | Admit: 2020-08-22 | Discharge: 2020-08-22 | Disposition: A | Discharge status: Home / Self Care | Payer: PPO | Source: Ambulatory Visit | Attending: Family Medicine | Admitting: Family Medicine

## 2020-08-22 DIAGNOSIS — Z1231 Encounter for screening mammogram for malignant neoplasm of breast: Secondary | ICD-10-CM | POA: Diagnosis not present

## 2020-08-24 ENCOUNTER — Encounter: Payer: Self-pay | Admitting: Emergency Medicine

## 2020-08-24 ENCOUNTER — Emergency Department
Admission: EM | Admit: 2020-08-24 | Discharge: 2020-08-24 | Disposition: A | Discharge status: Home / Self Care | Payer: PPO | Attending: Emergency Medicine | Admitting: Emergency Medicine

## 2020-08-24 ENCOUNTER — Emergency Department: Payer: PPO

## 2020-08-24 ENCOUNTER — Other Ambulatory Visit: Payer: Self-pay

## 2020-08-24 DIAGNOSIS — I5031 Acute diastolic (congestive) heart failure: Secondary | ICD-10-CM | POA: Insufficient documentation

## 2020-08-24 DIAGNOSIS — K5732 Diverticulitis of large intestine without perforation or abscess without bleeding: Secondary | ICD-10-CM | POA: Insufficient documentation

## 2020-08-24 DIAGNOSIS — R1032 Left lower quadrant pain: Secondary | ICD-10-CM | POA: Diagnosis not present

## 2020-08-24 DIAGNOSIS — Z7902 Long term (current) use of antithrombotics/antiplatelets: Secondary | ICD-10-CM | POA: Insufficient documentation

## 2020-08-24 DIAGNOSIS — R55 Syncope and collapse: Secondary | ICD-10-CM | POA: Insufficient documentation

## 2020-08-24 DIAGNOSIS — Z85828 Personal history of other malignant neoplasm of skin: Secondary | ICD-10-CM | POA: Insufficient documentation

## 2020-08-24 DIAGNOSIS — Z96653 Presence of artificial knee joint, bilateral: Secondary | ICD-10-CM | POA: Diagnosis not present

## 2020-08-24 DIAGNOSIS — Z8673 Personal history of transient ischemic attack (TIA), and cerebral infarction without residual deficits: Secondary | ICD-10-CM | POA: Diagnosis not present

## 2020-08-24 DIAGNOSIS — Z7982 Long term (current) use of aspirin: Secondary | ICD-10-CM | POA: Diagnosis not present

## 2020-08-24 DIAGNOSIS — Z79899 Other long term (current) drug therapy: Secondary | ICD-10-CM | POA: Insufficient documentation

## 2020-08-24 DIAGNOSIS — Z853 Personal history of malignant neoplasm of breast: Secondary | ICD-10-CM | POA: Insufficient documentation

## 2020-08-24 DIAGNOSIS — I110 Hypertensive heart disease with heart failure: Secondary | ICD-10-CM | POA: Diagnosis not present

## 2020-08-24 DIAGNOSIS — I10 Essential (primary) hypertension: Secondary | ICD-10-CM | POA: Diagnosis not present

## 2020-08-24 DIAGNOSIS — R109 Unspecified abdominal pain: Secondary | ICD-10-CM | POA: Diagnosis not present

## 2020-08-24 LAB — COMPREHENSIVE METABOLIC PANEL
ALT: 12 U/L (ref 0–44)
AST: 23 U/L (ref 15–41)
Albumin: 3.5 g/dL (ref 3.5–5.0)
Alkaline Phosphatase: 51 U/L (ref 38–126)
Anion gap: 8 (ref 5–15)
BUN: 19 mg/dL (ref 8–23)
CO2: 27 mmol/L (ref 22–32)
Calcium: 8.9 mg/dL (ref 8.9–10.3)
Chloride: 107 mmol/L (ref 98–111)
Creatinine, Ser: 0.85 mg/dL (ref 0.44–1.00)
GFR, Estimated: >60 mL/min (ref >60)
Glucose, Bld: 125 mg/dL — ABNORMAL HIGH (ref 70–99)
Potassium: 3.7 mmol/L (ref 3.5–5.1)
Sodium: 142 mmol/L (ref 135–145)
Total Bilirubin: 0.6 mg/dL (ref 0.3–1.2)
Total Protein: 6.9 g/dL (ref 6.5–8.1)

## 2020-08-24 LAB — URINALYSIS, COMPLETE (UACMP) WITH MICROSCOPIC
Bacteria, UA: NONE SEEN
Bilirubin Urine: NEGATIVE
Glucose, UA: NEGATIVE mg/dL
Hgb urine dipstick: NEGATIVE
Ketones, ur: NEGATIVE mg/dL
Leukocytes,Ua: NEGATIVE
Nitrite: NEGATIVE
Protein, ur: NEGATIVE mg/dL
Specific Gravity, Urine: 1.029 (ref 1.005–1.030)
pH: 5 (ref 5.0–8.0)

## 2020-08-24 LAB — CBC
HCT: 34 % — ABNORMAL LOW (ref 36.0–46.0)
Hemoglobin: 11 g/dL — ABNORMAL LOW (ref 12.0–15.0)
MCH: 28.2 pg (ref 26.0–34.0)
MCHC: 32.4 g/dL (ref 30.0–36.0)
MCV: 87.2 fL (ref 80.0–100.0)
Platelets: 138 10*3/uL — ABNORMAL LOW (ref 150–400)
RBC: 3.9 MIL/uL (ref 3.87–5.11)
RDW: 14 % (ref 11.5–15.5)
WBC: 6.5 10*3/uL (ref 4.0–10.5)
nRBC: 0 % (ref 0.0–0.2)

## 2020-08-24 LAB — TROPONIN I (HIGH SENSITIVITY): Troponin I (High Sensitivity): 8 ng/L (ref <18)

## 2020-08-24 LAB — LIPASE, BLOOD: Lipase: 25 U/L (ref 11–51)

## 2020-08-24 MED ORDER — METRONIDAZOLE 500 MG PO TABS
500.0000 mg | ORAL_TABLET | Freq: Once | ORAL | Status: AC
  Administered 2020-08-24: 500 mg via ORAL
  Filled 2020-08-24: qty 1

## 2020-08-24 MED ORDER — ONDANSETRON 4 MG PO TBDP: 4.0000 mg | ORAL_TABLET | Freq: Three times a day (TID) | ORAL | 0 refills | Status: DC | PRN

## 2020-08-24 MED ORDER — ONDANSETRON 4 MG PO TBDP
4.0000 mg | ORAL_TABLET | Freq: Once | ORAL | Status: AC
  Administered 2020-08-24: 4 mg via ORAL
  Filled 2020-08-24: qty 1

## 2020-08-24 MED ORDER — CIPROFLOXACIN HCL 500 MG PO TABS
500.0000 mg | ORAL_TABLET | Freq: Once | ORAL | Status: AC
  Administered 2020-08-24: 500 mg via ORAL
  Filled 2020-08-24: qty 1

## 2020-08-24 MED ORDER — ONDANSETRON HCL 4 MG/2ML IJ SOLN: 4.0000 mg | Freq: Once | INTRAMUSCULAR | Status: DC

## 2020-08-24 MED ORDER — IOHEXOL 300 MG/ML  SOLN
100.0000 mL | Freq: Once | INTRAMUSCULAR | Status: AC | PRN
  Administered 2020-08-24: 100 mL via INTRAVENOUS

## 2020-08-24 MED ORDER — METRONIDAZOLE 500 MG PO TABS: 500.0000 mg | ORAL_TABLET | Freq: Three times a day (TID) | ORAL | 0 refills | Status: DC

## 2020-08-24 MED ORDER — CIPROFLOXACIN HCL 500 MG PO TABS: 500.0000 mg | ORAL_TABLET | Freq: Two times a day (BID) | ORAL | 0 refills | Status: DC

## 2020-08-24 MED ORDER — SODIUM CHLORIDE 0.9 % IV BOLUS
500.0000 mL | Freq: Once | INTRAVENOUS | Status: AC
  Administered 2020-08-24: 500 mL via INTRAVENOUS

## 2020-08-24 NOTE — ED Provider Notes (Signed)
[facility] Emergency Department Provider Note  ____________________________________________  Time seen: Approximately 7:56 PM  I have reviewed the triage vital signs and the nursing notes.   HISTORY  Chief Complaint Abdominal Pain and Near Syncope    HPI Kayla Gray is a 84 y.o. female with a history of heart failure, prior stroke, hypertension, hyperlipidemia, diverticulitis who comes ED complaining of left lower quadrant abdominal pain for the past 2 days, associated with decreased appetite and oral intake, feeling lightheaded with standing and nearly passing out today.  Abdominal pain is moderate intensity, worse with movement, no alleviating factors, nonradiating.  Denies black or bloody stool.  No diarrhea.      Past Medical History:  Diagnosis Date  . Aortic stenosis   . Bilateral cataracts   . Bleeding nose    Thurs night (09/08/15) and Fri (09/09/15) left side.Bleeding didn't last long  . Breast cancer ([place]) 1999   right breast lumpectomy and rad tx  . Chronic diastolic (congestive) heart failure ([place])   . Depression   . Diverticulosis   . Gastroesophageal reflux disease   . Heart murmur   . Hyperlipidemia   . Hypertension   . Iron deficiency anemia   . Obstructive sleep apnea   . Osteoarthritis   . Osteopenia   . Peptic ulcer disease   . Personal history of radiation therapy   . S/P aortic valve replacement with bioprosthetic valve 09/16/2015   23 mm Edwards Intuity Elite bioprosthetic tissue valve  . Skin cancer 2020  . Stroke ([place])    11/14/13  . Supraventricular tachycardia ([place])   . Syncope and collapse   . Urinary tract infection      Patient Active Problem List   Diagnosis Date Noted  . Diverticulitis 05/27/2019  . Unsteady gait 12/27/2018  . Paroxysmal atrial fibrillation ([place]) 11/14/2015  . Encounter for therapeutic drug monitoring 09/29/2015  . Chronic diastolic (congestive) heart failure ([place])   . SOB (shortness  of breath) 08/17/2015  . Angina pectoris ([place]) 08/17/2015  . Low back pain 08/12/2015  . Sinus tachycardia 01/06/2014  . Osteoarthritis of both knees 01/06/2014  . Aortic valve stenosis 11/27/2012  . SVT (supraventricular tachycardia) ([place]) 11/27/2012  . Syncope 11/27/2012     Past Surgical History:  Procedure Laterality Date  . AORTIC VALVE REPLACEMENT N/A 09/16/2015   Procedure: AORTIC VALVE REPLACEMENT (AVR);  Surgeon: Rexene Alberts, MD;  Location: [place];  Service: Open Heart Surgery;  Laterality: N/A;  . BREAST BIOPSY Right 1999   breast ca radation  . BREAST EXCISIONAL BIOPSY Left yrs ago    benign  . BREAST LUMPECTOMY Right 1999   with radiation  . CARDIAC CATHETERIZATION N/A 08/17/2015   Procedure: Left Heart Cath and Coronary Angiography;  Surgeon: Minna Merritts, MD;  Location: [employer];  Service: Cardiovascular;  Laterality: N/A;  . CATARACT EXTRACTION, BILATERAL    . CHOLECYSTECTOMY    . JOINT REPLACEMENT Bilateral    knee replacement  . KNEE ARTHROSCOPY     right  . KNEE ARTHROSCOPY     left  . SKIN CANCER EXCISION  2020  . TEE WITHOUT CARDIOVERSION N/A 09/16/2015   Procedure: TRANSESOPHAGEAL ECHOCARDIOGRAM (TEE);  Surgeon: Rexene Alberts, MD;  Location: [place];  Service: Open Heart Surgery;  Laterality: N/A;  . TONSILLECTOMY    . VAGINAL HYSTERECTOMY       Prior to Admission medications   Medication Sig Start Date End Date Taking? Authorizing  Provider  acetaminophen (TYLENOL) 325 MG tablet Take 650 mg by mouth every 6 (six) hours as needed.    [provider]  amoxicillin (AMOXIL) 500 MG tablet Take 4 tablets (2,000 mg total) by mouth as directed. Take 2 hours before surgery or dental work 10/28/19   Minna Merritts, MD  aspirin EC 81 MG tablet Take 1 tablet (81 mg total) by mouth daily. 07/01/18   Minna Merritts, MD  Calcium Carbonate-Vitamin D (CALCIUM 600 + D PO) Take by mouth 2 (two) times daily.    [provider]   ciprofloxacin (CIPRO) 500 MG tablet Take 1 tablet (500 mg total) by mouth 2 (two) times daily. 08/24/20   Carrie Mew, MD  clopidogrel (PLAVIX) 75 MG tablet Take 75 mg by mouth daily.    [provider]  diltiazem (CARDIZEM) 120 MG tablet Take 120 mg by mouth daily. 06/25/12   [provider]  ezetimibe (ZETIA) 10 MG tablet Take 10 mg by mouth daily.    [provider]  fluticasone (FLONASE) 50 MCG/ACT nasal spray Place 2 sprays into the nose daily as needed.    [provider]  metoprolol tartrate (LOPRESSOR) 25 MG tablet TAKE 1 TABLET (25 MG TOTAL) BY MOUTH 2 (TWO) TIMES DAILY. 02/16/20   Minna Merritts, MD  metroNIDAZOLE (FLAGYL) 500 MG tablet Take 1 tablet (500 mg total) by mouth 3 (three) times daily. 08/24/20   Carrie Mew, MD  Multiple Vitamin (MULTIVITAMIN) tablet Take 1 tablet by mouth daily.    [provider]  ondansetron (ZOFRAN ODT) 4 MG disintegrating tablet Take 1 tablet (4 mg total) by mouth every 8 (eight) hours as needed for nausea or vomiting. 08/24/20   Carrie Mew, MD  pantoprazole (PROTONIX) 40 MG tablet Take 40 mg by mouth daily as needed.  07/04/14   [provider]  PARoxetine (PAXIL) 20 MG tablet Take 20 mg by mouth daily.    [provider]  pravastatin (PRAVACHOL) 40 MG tablet Take 40 mg by mouth daily.    [provider]     Allergies Evista [raloxifene], Sulfa antibiotics, Codeine, Nitrofurantoin, and Statins   Family History  Problem Relation Age of Onset  . Heart failure Mother   . Breast cancer Maternal Aunt     Social History Social History   Tobacco Use  . Smoking status: Never Smoker  . Smokeless tobacco: Never Used  Vaping Use  . Vaping Use: Never used  Substance Use Topics  . Alcohol use: Yes  . Drug use: No    Review of Systems  Constitutional:   No fever or chills.  ENT:   No sore throat. No rhinorrhea. Cardiovascular:   No chest pain or  syncope. Respiratory:   No dyspnea or cough. Gastrointestinal: Positive as above abdominal pain and vomiting.  No diarrhea Musculoskeletal:   Negative for focal pain or swelling All other systems reviewed and are negative except as documented above in ROS and HPI.  ____________________________________________   PHYSICAL EXAM:  VITAL SIGNS: ED Triage Vitals  Enc Vitals Group     BP 08/24/20 1523 132/73     Pulse Rate 08/24/20 1523 91     Resp 08/24/20 1523 20     Temp 08/24/20 1523 98.6 F (37 C)     Temp Source 08/24/20 1523 Oral     SpO2 08/24/20 1523 97 %     Weight 08/24/20 1524 161 lb (73 kg)     Height 08/24/20 1524  5' 2.5" (1.588 m)     Head Circumference --      Peak Flow --      Pain Score 08/24/20 1524 7     Pain Loc --      Pain Edu? --      Excl. in [place]? --     Vital signs reviewed, nursing assessments reviewed.   Constitutional:   Alert and oriented. Non-toxic appearance. Eyes:   Conjunctivae are normal. EOMI. PERRL. ENT      Head:   Normocephalic and atraumatic.      Nose:   Wearing a mask.      Mouth/Throat:   Wearing a mask.      Neck:   No meningismus. Full ROM. Hematological/Lymphatic/Immunilogical:   No cervical lymphadenopathy. Cardiovascular:   RRR. Symmetric bilateral radial and DP pulses.  No murmurs. Cap refill less than 2 seconds. Respiratory:   Normal respiratory effort without tachypnea/retractions. Breath sounds are clear and equal bilaterally. No wheezes/rales/rhonchi. Gastrointestinal:   Soft with left lower quadrant tenderness. Non distended. There is no CVA tenderness.  No rebound, rigidity, or guarding.  Musculoskeletal:   Normal range of motion in all extremities. No joint effusions.  No lower extremity tenderness.  No edema. Neurologic:   Normal speech and language.  Motor grossly intact. No acute focal neurologic deficits are appreciated.  Skin:    Skin is warm, dry and intact. No rash noted.  No petechiae, purpura, or  bullae.  ____________________________________________    LABS (pertinent positives/negatives) (all labs ordered are listed, but only abnormal results are displayed) Labs Reviewed  COMPREHENSIVE METABOLIC PANEL - Abnormal; Notable for the following components:      Result Value   Glucose, Bld 125 (*)    All other components within normal limits  CBC - Abnormal; Notable for the following components:   Hemoglobin 11.0 (*)    HCT 34.0 (*)    Platelets 138 (*)    All other components within normal limits  URINALYSIS, COMPLETE (UACMP) WITH MICROSCOPIC - Abnormal; Notable for the following components:   Color, Urine YELLOW (*)    APPearance HAZY (*)    All other components within normal limits  LIPASE, BLOOD  TROPONIN I (HIGH SENSITIVITY)  TROPONIN I (HIGH SENSITIVITY)   ____________________________________________   EKG  Interpreted by me Normal sinus rhythm rate of 90, normal axis and intervals.  Poor R wave progression.  Normal ST segments and T waves.  ____________________________________________    RADIOLOGY  CT ABDOMEN PELVIS W CONTRAST  Result Date: 08/24/2020 CLINICAL DATA:  84 year old female with left lower quadrant abdominal pain. Concern for acute diverticulitis EXAM: CT ABDOMEN AND PELVIS WITH CONTRAST TECHNIQUE: Multidetector CT imaging of the abdomen and pelvis was performed using the standard protocol following bolus administration of intravenous contrast. CONTRAST:  162mL OMNIPAQUE IOHEXOL 300 MG/ML  SOLN COMPARISON:  CT abdomen pelvis dated 05/27/2019. FINDINGS: Lower chest: Minimal bibasilar atelectasis. The visualized lung bases are otherwise clear. Mechanical aortic valve. No intra-abdominal free air or free fluid. Hepatobiliary: The liver is unremarkable. There is mild intrahepatic biliary ductal dilatation and mild dilatation of the common bile duct, likely post cholecystectomy. No retained calcified stone noted in the central CBD. Pancreas: Unremarkable. No  pancreatic ductal dilatation or surrounding inflammatory changes. Spleen: Normal in size without focal abnormality. Adrenals/Urinary Tract: The adrenal glands unremarkable. There is no hydronephrosis on either side. There is symmetric enhancement and excretion of contrast by both kidneys. The visualized ureters and urinary bladder appear unremarkable.  Stomach/Bowel: There is severe sigmoid diverticulosis with muscular hypertrophy. There is active inflammatory changes consistent with acute diverticulitis. No diverticular abscess or perforation. There is diffuse colonic diverticulosis. There is a small hiatal hernia. No bowel obstruction. The appendix is not visualized with certainty. No inflammatory changes identified in the right lower quadrant. Vascular/Lymphatic: Moderate aortoiliac atherosclerotic disease. The IVC is unremarkable. No portal venous gas. There is no adenopathy. Reproductive: Hysterectomy. No adnexal masses. Other: None Musculoskeletal: Osteopenia with degenerative changes of the spine. No acute osseous pathology. IMPRESSION: Sigmoid diverticulitis. No diverticular abscess or perforation. Electronically Signed   By: Anner Crete M.D.   On: 08/24/2020 19:12    ____________________________________________   PROCEDURES Procedures  ____________________________________________  DIFFERENTIAL DIAGNOSIS   Diverticulitis, bowel obstruction, dehydration, electrolyte abnormality  CLINICAL IMPRESSION / ASSESSMENT AND PLAN / ED COURSE  Medications ordered in the ED: Medications  ondansetron (ZOFRAN) injection 4 mg (4 mg Intravenous Not Given 08/24/20 1927)  sodium chloride 0.9 % bolus 500 mL (0 mLs Intravenous Stopped 08/24/20 1936)  iohexol (OMNIPAQUE) 300 MG/ML solution 100 mL (100 mLs Intravenous Contrast Given 08/24/20 1833)  ciprofloxacin (CIPRO) tablet 500 mg (500 mg Oral Given 08/24/20 1936)  metroNIDAZOLE (FLAGYL) tablet 500 mg (500 mg Oral Given 08/24/20 1936)  ondansetron  (ZOFRAN-ODT) disintegrating tablet 4 mg (4 mg Oral Given 08/24/20 1936)    Pertinent labs & imaging results that were available during my care of the patient were reviewed by me and considered in my medical decision making (see chart for details).  SULMA RUFFINO was evaluated in Emergency Department on 08/24/2020 for the symptoms described in the history of present illness. She was evaluated in the context of the global COVID-19 pandemic, which necessitated consideration that the patient might be at risk for infection with the SARS-CoV-2 virus that causes COVID-19. Institutional protocols and algorithms that pertain to the evaluation of patients at risk for COVID-19 are in a state of rapid change based on information released by regulatory bodies including the CDC and federal and state organizations. These policies and algorithms were followed during the patient's care in the ED.   Patient presents with left lower quadrant abdominal pain, vomiting, decreased oral intake and dehydration.  Think her near syncope is due to orthostatics.  Will give IV fluids, check CT abdomen pelvis.  Clinical Course as of Aug 25 1955  Wed Aug 24, 2020  1927 CT shows sigmoid diverticulitis, uncomplicated.  Vital signs and lab panel are reassuring.  Offered to hospitalization for initial symptom control, hydration and initiation of antibiotics but patient declines and would rather try outpatient therapy.  I think this is reasonable, will prescribe antibiotics and Zofran.   [PS]    Clinical Course User Index [PS] Carrie Mew, MD      ____________________________________________   FINAL CLINICAL IMPRESSION(S) / ED DIAGNOSES    Final diagnoses:  Sigmoid diverticulitis  Left lower quadrant abdominal pain     ED Discharge Orders         Ordered    ciprofloxacin (CIPRO) 500 MG tablet  2 times daily        08/24/20 1932    metroNIDAZOLE (FLAGYL) 500 MG tablet  3 times daily        08/24/20 1932     ondansetron (ZOFRAN ODT) 4 MG disintegrating tablet  Every 8 hours PRN        08/24/20 1932          Portions of this note were generated with dragon dictation  software. Dictation errors may occur despite best attempts at proofreading.   Carrie Mew, MD 08/24/20 1958

## 2020-08-24 NOTE — ED Triage Notes (Signed)
Pt presents to ED via POV with c/o LLQ abdominal pain, several episodes of emesis, and a near syncopal episode just prior to arrival. Pt states hx of diverticulosis, thinks this may be flare up. Pt denies any diarrhea at this time. On arrival pt A&O x4, NAD noted, VSS in triage.    Pt states was sitting at her make up table and felt like she was going to pass out, pt states was able to climb on her bed.

## 2020-08-30 ENCOUNTER — Emergency Department
Admission: EM | Admit: 2020-08-30 | Discharge: 2020-08-30 | Disposition: A | Discharge status: Home / Self Care | Payer: PPO | Attending: Emergency Medicine | Admitting: Emergency Medicine

## 2020-08-30 ENCOUNTER — Other Ambulatory Visit: Payer: Self-pay

## 2020-08-30 ENCOUNTER — Encounter: Payer: Self-pay | Admitting: *Deleted

## 2020-08-30 DIAGNOSIS — Z96653 Presence of artificial knee joint, bilateral: Secondary | ICD-10-CM | POA: Diagnosis not present

## 2020-08-30 DIAGNOSIS — Z7901 Long term (current) use of anticoagulants: Secondary | ICD-10-CM | POA: Insufficient documentation

## 2020-08-30 DIAGNOSIS — Z853 Personal history of malignant neoplasm of breast: Secondary | ICD-10-CM | POA: Insufficient documentation

## 2020-08-30 DIAGNOSIS — Z7982 Long term (current) use of aspirin: Secondary | ICD-10-CM | POA: Insufficient documentation

## 2020-08-30 DIAGNOSIS — M722 Plantar fascial fibromatosis: Secondary | ICD-10-CM | POA: Insufficient documentation

## 2020-08-30 DIAGNOSIS — K5792 Diverticulitis of intestine, part unspecified, without perforation or abscess without bleeding: Secondary | ICD-10-CM | POA: Diagnosis not present

## 2020-08-30 DIAGNOSIS — Z85828 Personal history of other malignant neoplasm of skin: Secondary | ICD-10-CM | POA: Diagnosis not present

## 2020-08-30 DIAGNOSIS — I110 Hypertensive heart disease with heart failure: Secondary | ICD-10-CM | POA: Diagnosis not present

## 2020-08-30 DIAGNOSIS — M79672 Pain in left foot: Secondary | ICD-10-CM | POA: Diagnosis not present

## 2020-08-30 DIAGNOSIS — I5032 Chronic diastolic (congestive) heart failure: Secondary | ICD-10-CM | POA: Diagnosis not present

## 2020-08-30 DIAGNOSIS — Z79899 Other long term (current) drug therapy: Secondary | ICD-10-CM | POA: Insufficient documentation

## 2020-08-30 LAB — BASIC METABOLIC PANEL
Anion gap: 12 (ref 5–15)
BUN: 13 mg/dL (ref 8–23)
CO2: 22 mmol/L (ref 22–32)
Calcium: 8.8 mg/dL — ABNORMAL LOW (ref 8.9–10.3)
Chloride: 105 mmol/L (ref 98–111)
Creatinine, Ser: 1.11 mg/dL — ABNORMAL HIGH (ref 0.44–1.00)
GFR, Estimated: 50 mL/min — ABNORMAL LOW (ref >60)
Glucose, Bld: 178 mg/dL — ABNORMAL HIGH (ref 70–99)
Potassium: 3.5 mmol/L (ref 3.5–5.1)
Sodium: 139 mmol/L (ref 135–145)

## 2020-08-30 LAB — CBC
HCT: 36.4 % (ref 36.0–46.0)
Hemoglobin: 11.9 g/dL — ABNORMAL LOW (ref 12.0–15.0)
MCH: 28.1 pg (ref 26.0–34.0)
MCHC: 32.7 g/dL (ref 30.0–36.0)
MCV: 85.8 fL (ref 80.0–100.0)
Platelets: 211 10*3/uL (ref 150–400)
RBC: 4.24 MIL/uL (ref 3.87–5.11)
RDW: 13.5 % (ref 11.5–15.5)
WBC: 7.1 10*3/uL (ref 4.0–10.5)
nRBC: 0 % (ref 0.0–0.2)

## 2020-08-30 MED ORDER — AMOXICILLIN-POT CLAVULANATE 875-125 MG PO TABS: 1.0000 | ORAL_TABLET | Freq: Two times a day (BID) | ORAL | 0 refills | Status: AC

## 2020-08-30 NOTE — Discharge Instructions (Addendum)
As we discussed please discontinue your ciprofloxacin as well as metronidazole antibiotics.  Instead please begin taking Augmentin tomorrow 08/31/2020 twice daily for the next 7 days.  Please call your primary care physician to arrange a follow-up appointment.  Return to the emergency department for any symptom personally concerning to yourself.

## 2020-08-30 NOTE — ED Notes (Signed)
Pt calm , collective , denied pain or sob  

## 2020-08-30 NOTE — ED Provider Notes (Signed)
[facility] Emergency Department Provider Note  Time seen: 10:28 PM  I have reviewed the triage vital signs and the nursing notes.   HISTORY  Chief Complaint Foot Pain   HPI Kayla Gray is a 84 y.o. female with a past medical history of hypertension, hyperlipidemia, CHF, prior CVA, presents to the emergency department for left foot pain.  According to the patient 1 week ago she was diagnosed with diverticulitis, was placed on ciprofloxacin and Flagyl.  Patient states the antibiotics she believes have caused her to have some diarrhea, she developed left foot pain and saw on the prescription that Cipro can cause tendon pain and she should see her doctor.  Patient came to the emergency department for evaluation.  Patient denies any nausea or vomiting.  No fever.  No abdominal pain.   Past Medical History:  Diagnosis Date  . Aortic stenosis   . Bilateral cataracts   . Bleeding nose    Thurs night (09/08/15) and Fri (09/09/15) left side.Bleeding didn't last long  . Breast cancer ([place]) 1999   right breast lumpectomy and rad tx  . Chronic diastolic (congestive) heart failure ([place])   . Depression   . Diverticulosis   . Gastroesophageal reflux disease   . Heart murmur   . Hyperlipidemia   . Hypertension   . Iron deficiency anemia   . Obstructive sleep apnea   . Osteoarthritis   . Osteopenia   . Peptic ulcer disease   . Personal history of radiation therapy   . S/P aortic valve replacement with bioprosthetic valve 09/16/2015   23 mm Edwards Intuity Elite bioprosthetic tissue valve  . Skin cancer 2020  . Stroke ([place])    11/14/13  . Supraventricular tachycardia ([place])   . Syncope and collapse   . Urinary tract infection     Patient Active Problem List   Diagnosis Date Noted  . Diverticulitis 05/27/2019  . Unsteady gait 12/27/2018  . Paroxysmal atrial fibrillation ([place]) 11/14/2015  . Encounter for therapeutic drug monitoring 09/29/2015  . Chronic  diastolic (congestive) heart failure ([place])   . SOB (shortness of breath) 08/17/2015  . Angina pectoris ([place]) 08/17/2015  . Low back pain 08/12/2015  . Sinus tachycardia 01/06/2014  . Osteoarthritis of both knees 01/06/2014  . Aortic valve stenosis 11/27/2012  . SVT (supraventricular tachycardia) ([place]) 11/27/2012  . Syncope 11/27/2012    Past Surgical History:  Procedure Laterality Date  . AORTIC VALVE REPLACEMENT N/A 09/16/2015   Procedure: AORTIC VALVE REPLACEMENT (AVR);  Surgeon: Rexene Alberts, MD;  Location: [facility];  Service: Open Heart Surgery;  Laterality: N/A;  . BREAST BIOPSY Right 1999   breast ca radation  . BREAST EXCISIONAL BIOPSY Left yrs ago    benign  . BREAST LUMPECTOMY Right 1999   with radiation  . CARDIAC CATHETERIZATION N/A 08/17/2015   Procedure: Left Heart Cath and Coronary Angiography;  Surgeon: Minna Merritts, MD;  Location: [facility];  Service: Cardiovascular;  Laterality: N/A;  . CATARACT EXTRACTION, BILATERAL    . CHOLECYSTECTOMY    . JOINT REPLACEMENT Bilateral    knee replacement  . KNEE ARTHROSCOPY     right  . KNEE ARTHROSCOPY     left  . SKIN CANCER EXCISION  2020  . TEE WITHOUT CARDIOVERSION N/A 09/16/2015   Procedure: TRANSESOPHAGEAL ECHOCARDIOGRAM (TEE);  Surgeon: Rexene Alberts, MD;  Location: [facility];  Service: Open Heart Surgery;  Laterality: N/A;  . TONSILLECTOMY    .  VAGINAL HYSTERECTOMY      Prior to Admission medications   Medication Sig Start Date End Date Taking? Authorizing Provider  acetaminophen (TYLENOL) 325 MG tablet Take 650 mg by mouth every 6 (six) hours as needed.    [provider]  amoxicillin (AMOXIL) 500 MG tablet Take 4 tablets (2,000 mg total) by mouth as directed. Take 2 hours before surgery or dental work 10/28/19   Minna Merritts, MD  aspirin EC 81 MG tablet Take 1 tablet (81 mg total) by mouth daily. 07/01/18   Minna Merritts, MD  Calcium Carbonate-Vitamin D (CALCIUM 600 + D PO) Take by  mouth 2 (two) times daily.    [provider]  ciprofloxacin (CIPRO) 500 MG tablet Take 1 tablet (500 mg total) by mouth 2 (two) times daily. 08/24/20   Carrie Mew, MD  clopidogrel (PLAVIX) 75 MG tablet Take 75 mg by mouth daily.    [provider]  diltiazem (CARDIZEM) 120 MG tablet Take 120 mg by mouth daily. 06/25/12   [provider]  ezetimibe (ZETIA) 10 MG tablet Take 10 mg by mouth daily.    [provider]  fluticasone (FLONASE) 50 MCG/ACT nasal spray Place 2 sprays into the nose daily as needed.    [provider]  metoprolol tartrate (LOPRESSOR) 25 MG tablet TAKE 1 TABLET (25 MG TOTAL) BY MOUTH 2 (TWO) TIMES DAILY. 02/16/20   Minna Merritts, MD  metroNIDAZOLE (FLAGYL) 500 MG tablet Take 1 tablet (500 mg total) by mouth 3 (three) times daily. 08/24/20   Carrie Mew, MD  Multiple Vitamin (MULTIVITAMIN) tablet Take 1 tablet by mouth daily.    [provider]  ondansetron (ZOFRAN ODT) 4 MG disintegrating tablet Take 1 tablet (4 mg total) by mouth every 8 (eight) hours as needed for nausea or vomiting. 08/24/20   Carrie Mew, MD  pantoprazole (PROTONIX) 40 MG tablet Take 40 mg by mouth daily as needed.  07/04/14   [provider]  PARoxetine (PAXIL) 20 MG tablet Take 20 mg by mouth daily.    [provider]  pravastatin (PRAVACHOL) 40 MG tablet Take 40 mg by mouth daily.    [provider]    Allergies  Allergen Reactions  . Evista [Raloxifene]   . Sulfa Antibiotics Hives  . Codeine Hives  . Nitrofurantoin Rash  . Statins Other (See Comments) and Rash    Leg muscle cramps    Family History  Problem Relation Age of Onset  . Heart failure Mother   . Breast cancer Maternal Aunt     Social History Social History   Tobacco Use  . Smoking status: Never Smoker  . Smokeless tobacco: Never Used  Vaping Use  . Vaping Use: Never used  Substance Use Topics  . Alcohol use: Not Currently   . Drug use: No    Review of Systems Constitutional: Negative for fever. Cardiovascular: Negative for chest pain. Respiratory: Negative for shortness of breath. Gastrointestinal: Negative for abdominal pain.  Mild diarrhea. Genitourinary: Negative for urinary compaints Musculoskeletal: Left foot pain Skin: Negative for skin complaints  Neurological: Negative for headache All other ROS negative  ____________________________________________   PHYSICAL EXAM:  VITAL SIGNS: ED Triage Vitals  Enc Vitals Group     BP 08/30/20 1944 (!) 147/101     Pulse Rate 08/30/20 1944 (!) 107     Resp 08/30/20 1944 18     Temp 08/30/20 1944 98.7 F (37.1 C)     Temp Source  08/30/20 1944 Oral     SpO2 08/30/20 1944 97 %     Weight 08/30/20 1942 160 lb (72.6 kg)     Height 08/30/20 1942 5\' 2"  (1.575 m)     Head Circumference --      Peak Flow --      Pain Score 08/30/20 1942 5     Pain Loc --      Pain Edu? --      Excl. in [place]? --    Constitutional: Alert and oriented. Well appearing and in no distress. Eyes: Normal exam ENT      Head: Normocephalic and atraumatic.      Mouth/Throat: Mucous membranes are moist. Cardiovascular: Normal rate, regular rhythm.  Respiratory: Normal respiratory effort without tachypnea nor retractions. Breath sounds are clear Gastrointestinal: Soft and nontender. No distention.  Musculoskeletal: Patient has mild tenderness to palpation of the plantar aspect of the left foot over the plantar fascia.  No erythema. Neurologic:  Normal speech and language. No gross focal neurologic deficits Skin:  Skin is warm, dry and intact.  Psychiatric: Mood and affect are normal.   ____________________________________________   INITIAL IMPRESSION / ASSESSMENT AND PLAN / ED COURSE  Pertinent labs & imaging results that were available during my care of the patient were reviewed by me and considered in my medical decision making (see chart for details).   Patient presents  emergency department for left foot pain.  Patient is currently taking ciprofloxacin patient is tender over the plantar fascia.  Differential would include plantar fasciitis but cannot rule out tendinitis due to ciprofloxacin.  I reviewed the patient's recent ER visit 08/24/2020 in which she was diagnosed with uncomplicated diverticulitis on CT scan.  Patient denies any abdominal pain.  Does continue to have intermittent diarrhea which she relates more to the antibiotics which is possible.  Reassuring vitals, reassuring lab work in the emergency department.  We will discontinue the patient's ciprofloxacin and Flagyl and instead put the patient on Augmentin twice daily x7 days.  Discussed rest for the foot and avoiding stressing the foot.  Patient will follow up with her doctor.  Patient agreeable to plan of care.  Kayla Gray was evaluated in Emergency Department on 08/30/2020 for the symptoms described in the history of present illness. She was evaluated in the context of the global COVID-19 pandemic, which necessitated consideration that the patient might be at risk for infection with the SARS-CoV-2 virus that causes COVID-19. Institutional protocols and algorithms that pertain to the evaluation of patients at risk for COVID-19 are in a state of rapid change based on information released by regulatory bodies including the CDC and federal and state organizations. These policies and algorithms were followed during the patient's care in the ED.  ____________________________________________   FINAL CLINICAL IMPRESSION(S) / ED DIAGNOSES  Diverticulitis Plantar fasciitis   Harvest Dark, MD 08/30/20 2232

## 2020-08-30 NOTE — ED Triage Notes (Signed)
Pt to triage via wheelchair.  Pt has left foot pain.  Hx diverticulitis.  Pt taking cipro x 1 week.  Today pt started having left foot pain and burning.   States cipro may be causing the pain in foot.  Pt alert  Speech clear.

## 2020-09-05 DIAGNOSIS — K5792 Diverticulitis of intestine, part unspecified, without perforation or abscess without bleeding: Secondary | ICD-10-CM | POA: Diagnosis not present

## 2020-09-05 DIAGNOSIS — Z23 Encounter for immunization: Secondary | ICD-10-CM | POA: Diagnosis not present

## 2020-10-20 DIAGNOSIS — Z79899 Other long term (current) drug therapy: Secondary | ICD-10-CM | POA: Diagnosis not present

## 2020-10-20 DIAGNOSIS — E7800 Pure hypercholesterolemia, unspecified: Secondary | ICD-10-CM | POA: Diagnosis not present

## 2020-10-21 DIAGNOSIS — Z1331 Encounter for screening for depression: Secondary | ICD-10-CM | POA: Diagnosis not present

## 2020-10-21 DIAGNOSIS — Z0000 Encounter for general adult medical examination without abnormal findings: Secondary | ICD-10-CM | POA: Diagnosis not present

## 2020-10-31 ENCOUNTER — Ambulatory Visit: Payer: PPO | Admitting: Cardiovascular Disease

## 2020-11-01 NOTE — Progress Notes (Unsigned)
Cardiology Office Note  Date:  11/02/2020   ID:  Kayla Gray, DOB Apr 29, 1936, MRN [id number]  PCP:  Derinda Late, MD   Chief Complaint  Patient presents with  . Follow-up    12 month F/U-No new cardiac concerns    HPI:  Kayla Gray is a very pleasant 85 year old woman with history of  severe aortic valve stenosis, status post AVR with bioprosthetic valve 09/16/2015  postoperative atrial fibrillation, started on amiodarone, warfarin syncope (stumbled backwards, fell back hit head, 2013),  hyperlipidemia,  iron deficiency anemia, peptic ulcer disease Status post left total knee replacement 11/15/2011 SVT in February 2013 admitted  elevated troponin felt secondary to demand ischemia from tachycardia.  February 2015 with TIA-type symptoms, 30 day monitor did not show significant arrhythmia Cardiac cath 08/2015: no CAD She presents today for  Syncope, SVT and bioprosthetic aortic valve  Reports she is doing well on today's visit No regular exercise program, walks with a cane, sedentary No recent falls She does have prior history of falls  Denies any tachycardia palpitations concerning for atrial fibrillation or SVT Continues on metoprolol twice a day, aspirin  No near syncope or syncope  No recent TIA or stroke symptoms  Lab work reviewed with her in detail  on zetia Off pravastatin   EKG personally reviewed by myself on todays visit Shows normal sinus rhythm rate 66 bpm,  nonspecific ST abn  Other past medical history reviewed  October 20th, 2017  syncopal episode , out having supper at [employer]. Went to bathroom. Had abdominal pain, urgency, felt that she had to have a bowel movement.   lightheadedness, everything went white prior to having her bowel movement. Found herself on the floor, had released her bowels  emergency department at [facility]   brain CT scan was negative. Seen by neurology, had EEG performed   MRI  12/7  Did not participated in cardiac  rehabilitation    postoperative atrial fibrillation, started on amiodarone, warfarin not had any problems with her anticoagulation  Previous echocardiogram showing Peak velocity 510 cm/s, mean gradient 54 mmHg, peak gradient 104 mmHg.  She had knee replacement surgery early 2016  echocardiogram 01/ 8 2014 showed normal LV function, aortic valve velocity 315 cm/s, peak gradient 56 mm of mercury, mean gradient 19 mm of mercury, estimated aortic valve area 0.61 cm Echocardiogram in late 2014 showing velocity of 400    PMH:   has a past medical history of Aortic stenosis, Bilateral cataracts, Bleeding nose, Breast cancer ([employer]) (1999), Chronic diastolic (congestive) heart failure ([place]), Depression, Diverticulosis, Gastroesophageal reflux disease, Heart murmur, Hyperlipidemia, Hypertension, Iron deficiency anemia, Obstructive sleep apnea, Osteoarthritis, Osteopenia, Peptic ulcer disease, Personal history of radiation therapy, S/P aortic valve replacement with bioprosthetic valve (09/16/2015), Skin cancer (2020), Stroke [facility]), Supraventricular tachycardia ([place]), Syncope and collapse, and Urinary tract infection.  PSH:    Past Surgical History:  Procedure Laterality Date  . AORTIC VALVE REPLACEMENT N/A 09/16/2015   Procedure: AORTIC VALVE REPLACEMENT (AVR);  Surgeon: Rexene Alberts, MD;  Location: [facility];  Service: Open Heart Surgery;  Laterality: N/A;  . BREAST BIOPSY Right 1999   breast ca radation  . BREAST EXCISIONAL BIOPSY Left yrs ago    benign  . BREAST LUMPECTOMY Right 1999   with radiation  . CARDIAC CATHETERIZATION N/A 08/17/2015   Procedure: Left Heart Cath and Coronary Angiography;  Surgeon: Minna Merritts, MD;  Location: [facility];  Service: Cardiovascular;  Laterality: N/A;  . CATARACT  EXTRACTION, BILATERAL    . CHOLECYSTECTOMY    . JOINT REPLACEMENT Bilateral    knee replacement  . KNEE ARTHROSCOPY     right  . KNEE ARTHROSCOPY     left  . SKIN CANCER  EXCISION  2020  . TEE WITHOUT CARDIOVERSION N/A 09/16/2015   Procedure: TRANSESOPHAGEAL ECHOCARDIOGRAM (TEE);  Surgeon: Rexene Alberts, MD;  Location: [facility];  Service: Open Heart Surgery;  Laterality: N/A;  . TONSILLECTOMY    . VAGINAL HYSTERECTOMY      Current Outpatient Medications  Medication Sig Dispense Refill  . acetaminophen (TYLENOL) 325 MG tablet Take 650 mg by mouth every 6 (six) hours as needed.    Marland Kitchen amoxicillin (AMOXIL) 500 MG tablet Take 4 tablets (2,000 mg total) by mouth as directed. Take 2 hours before surgery or dental work 4 tablet 1  . aspirin EC 81 MG tablet Take 1 tablet (81 mg total) by mouth daily. 90 tablet 3  . Calcium Carbonate-Vitamin D (CALCIUM 600 + D PO) Take by mouth 2 (two) times daily.    Marland Kitchen ezetimibe (ZETIA) 10 MG tablet Take 10 mg by mouth daily.    . fluticasone (FLONASE) 50 MCG/ACT nasal spray Place 2 sprays into the nose daily as needed.    . metoprolol tartrate (LOPRESSOR) 25 MG tablet TAKE 1 TABLET (25 MG TOTAL) BY MOUTH 2 (TWO) TIMES DAILY. 180 tablet 2  . Multiple Vitamin (MULTIVITAMIN) tablet Take 1 tablet by mouth daily.    . pantoprazole (PROTONIX) 40 MG tablet Take 40 mg by mouth daily as needed.     Marland Kitchen PARoxetine (PAXIL) 20 MG tablet Take 20 mg by mouth daily.     No current facility-administered medications for this visit.     Allergies:   Evista [raloxifene], Sulfa antibiotics, Codeine, Nitrofurantoin, Oxycodone-acetaminophen, and Statins   Social History:  The patient  reports that she has never smoked. She has never used smokeless tobacco. She reports previous alcohol use. She reports that she does not use drugs.   Family History:   family history includes Breast cancer in her maternal aunt; Heart failure in her mother.    Review of Systems: Review of Systems  Constitutional: Negative.   HENT: Negative.   Respiratory: Negative.   Cardiovascular: Negative.   Gastrointestinal: Negative.   Musculoskeletal: Negative.   Neurological:  Negative.   Psychiatric/Behavioral: Negative.   All other systems reviewed and are negative.   PHYSICAL EXAM: VS:  BP 110/80 (BP Location: Left Arm, Patient Position: Sitting, Cuff Size: Normal)   Pulse 66   Ht 5\' 2"  (1.575 m)   Wt 161 lb (73 kg)   SpO2 98%   BMI 29.45 kg/m  , BMI Body mass index is 29.45 kg/m. Constitutional:  oriented to person, place, and time. No distress.  HENT:  Head: Grossly normal Eyes:  no discharge. No scleral icterus.  Neck: No JVD, no carotid bruits  Cardiovascular: Regular rate and rhythm, no murmurs appreciated Pulmonary/Chest: Clear to auscultation bilaterally, no wheezes or rails Abdominal: Soft.  no distension.  no tenderness.  Musculoskeletal: Normal range of motion Neurological:  normal muscle tone. Coordination normal. No atrophy Skin: Skin warm and dry Psychiatric: normal affect, pleasant  Recent Labs: 08/24/2020: ALT 12 08/30/2020: BUN 13; Creatinine, Ser 1.11; Hemoglobin 11.9; Platelets 211; Potassium 3.5; Sodium 139    Lipid Panel Lab Results  Component Value Date   CHOL 202 (H) 11/17/2013   HDL 41 11/17/2013   LDLCALC 129 (H) 11/17/2013  TRIG 159 11/17/2013    Wt Readings from Last 3 Encounters:  11/02/20 161 lb (73 kg)  08/30/20 160 lb (72.6 kg)  08/24/20 161 lb (73 kg)     ASSESSMENT AND PLAN:  SVT (supraventricular tachycardia) ([place]) - Plan: EKG 12-Lead Prior history of SVT.  No recent episodes, Will stay on metoprolol  Paroxysmal atrial fibrillation ([place]) - Plan: EKG 12-Lead Off warfarin Start aspirin 81 mg daily Denies atrial fibrillation symptoms  Syncope, unspecified syncope type - Plan: EKG 12-Lead  vasovagal event in the setting of abdominal pain, bowel urgency She had syncope with defecation No recent episodes  Severe aortic valve stenosis S/P aortic valve replacement with bioprosthetic valve Consider repeat echocardiogram in follow-up Prior echocardiogram reviewed, stable valve   Total encounter  time more than 25 minutes  Greater than 50% was spent in counseling and coordination of care with the patient    No orders of the defined types were placed in this encounter.    Signed, Esmond Plants, M.D., Ph.D. 11/02/2020  [place], [place]

## 2020-11-02 ENCOUNTER — Ambulatory Visit: Payer: PPO | Admitting: Cardiovascular Disease

## 2020-11-02 ENCOUNTER — Other Ambulatory Visit: Payer: Self-pay

## 2020-11-02 ENCOUNTER — Encounter: Payer: Self-pay | Admitting: Cardiovascular Disease

## 2020-11-02 VITALS — BP 110/80 | HR 66 | Ht 62.0 in | Wt 161.0 lb

## 2020-11-02 DIAGNOSIS — I471 Supraventricular tachycardia: Secondary | ICD-10-CM | POA: Diagnosis not present

## 2020-11-02 DIAGNOSIS — I5032 Chronic diastolic (congestive) heart failure: Secondary | ICD-10-CM | POA: Diagnosis not present

## 2020-11-02 DIAGNOSIS — I480 Paroxysmal atrial fibrillation: Secondary | ICD-10-CM

## 2020-11-02 DIAGNOSIS — I6310 Cerebral infarction due to embolism of unspecified precerebral artery: Secondary | ICD-10-CM | POA: Diagnosis not present

## 2020-11-02 DIAGNOSIS — Z953 Presence of xenogenic heart valve: Secondary | ICD-10-CM | POA: Diagnosis not present

## 2020-11-02 DIAGNOSIS — I350 Nonrheumatic aortic (valve) stenosis: Secondary | ICD-10-CM

## 2020-11-02 NOTE — Patient Instructions (Signed)
Medication Instructions:  No changes  If you need a refill on your cardiac medications before your next appointment, please call your pharmacy.    Lab work: No new labs needed   If you have labs (blood work) drawn today and your tests are completely normal, you will receive your results only by: . MyChart Message (if you have MyChart) OR . A paper copy in the mail If you have any lab test that is abnormal or we need to change your treatment, we will call you to review the results.   Testing/Procedures: No new testing needed   Follow-Up: At [facility], you and your health needs are our priority.  As part of our continuing mission to provide you with exceptional heart care, we have created designated Provider Care Teams.  These Care Teams include your primary Cardiologist (physician) and Advanced Practice Providers (APPs -  Physician Assistants and Nurse Practitioners) who all work together to provide you with the care you need, when you need it.  . You will need a follow up appointment in 12 months  . Providers on your designated Care Team:   . Christopher Berge, NP . Ryan Dunn, PA-C . Jacquelyn Visser, PA-C  Any Other Special Instructions Will Be Listed Below (If Applicable).  COVID-19 Vaccine Information can be found at: [email or web address] For questions related to vaccine distribution or appointments, please email [email or web address] or call [phone].     

## 2021-02-21 DIAGNOSIS — Z961 Presence of intraocular lens: Secondary | ICD-10-CM | POA: Diagnosis not present

## 2021-03-27 DIAGNOSIS — Z09 Encounter for follow-up examination after completed treatment for conditions other than malignant neoplasm: Secondary | ICD-10-CM | POA: Diagnosis not present

## 2021-03-27 DIAGNOSIS — Z87440 Personal history of urinary (tract) infections: Secondary | ICD-10-CM | POA: Diagnosis not present

## 2021-04-19 DIAGNOSIS — I10 Essential (primary) hypertension: Secondary | ICD-10-CM | POA: Diagnosis not present

## 2021-04-19 DIAGNOSIS — Z79899 Other long term (current) drug therapy: Secondary | ICD-10-CM | POA: Diagnosis not present

## 2021-04-19 DIAGNOSIS — E7800 Pure hypercholesterolemia, unspecified: Secondary | ICD-10-CM | POA: Diagnosis not present

## 2021-04-21 DIAGNOSIS — I10 Essential (primary) hypertension: Secondary | ICD-10-CM | POA: Diagnosis not present

## 2021-04-21 DIAGNOSIS — I700 Atherosclerosis of aorta: Secondary | ICD-10-CM | POA: Diagnosis not present

## 2021-04-21 DIAGNOSIS — M791 Myalgia, unspecified site: Secondary | ICD-10-CM | POA: Diagnosis not present

## 2021-04-21 DIAGNOSIS — N1831 Chronic kidney disease, stage 3a: Secondary | ICD-10-CM | POA: Diagnosis not present

## 2021-04-21 DIAGNOSIS — F411 Generalized anxiety disorder: Secondary | ICD-10-CM | POA: Diagnosis not present

## 2021-04-21 DIAGNOSIS — T466X5A Adverse effect of antihyperlipidemic and antiarteriosclerotic drugs, initial encounter: Secondary | ICD-10-CM | POA: Diagnosis not present

## 2021-04-21 DIAGNOSIS — Z79899 Other long term (current) drug therapy: Secondary | ICD-10-CM | POA: Diagnosis not present

## 2021-04-21 DIAGNOSIS — E7800 Pure hypercholesterolemia, unspecified: Secondary | ICD-10-CM | POA: Diagnosis not present

## 2021-04-24 ENCOUNTER — Other Ambulatory Visit: Payer: Self-pay | Admitting: Cardiovascular Disease

## 2021-04-26 DIAGNOSIS — D2262 Melanocytic nevi of left upper limb, including shoulder: Secondary | ICD-10-CM | POA: Diagnosis not present

## 2021-04-26 DIAGNOSIS — X32XXXA Exposure to sunlight, initial encounter: Secondary | ICD-10-CM | POA: Diagnosis not present

## 2021-04-26 DIAGNOSIS — D485 Neoplasm of uncertain behavior of skin: Secondary | ICD-10-CM | POA: Diagnosis not present

## 2021-04-26 DIAGNOSIS — D225 Melanocytic nevi of trunk: Secondary | ICD-10-CM | POA: Diagnosis not present

## 2021-04-26 DIAGNOSIS — D2261 Melanocytic nevi of right upper limb, including shoulder: Secondary | ICD-10-CM | POA: Diagnosis not present

## 2021-04-26 DIAGNOSIS — Z85828 Personal history of other malignant neoplasm of skin: Secondary | ICD-10-CM | POA: Diagnosis not present

## 2021-04-26 DIAGNOSIS — L821 Other seborrheic keratosis: Secondary | ICD-10-CM | POA: Diagnosis not present

## 2021-04-26 DIAGNOSIS — L570 Actinic keratosis: Secondary | ICD-10-CM | POA: Diagnosis not present

## 2021-04-26 DIAGNOSIS — D045 Carcinoma in situ of skin of trunk: Secondary | ICD-10-CM | POA: Diagnosis not present

## 2021-06-13 DIAGNOSIS — D045 Carcinoma in situ of skin of trunk: Secondary | ICD-10-CM | POA: Diagnosis not present

## 2021-07-25 ENCOUNTER — Other Ambulatory Visit: Payer: Self-pay | Admitting: Family Medicine

## 2021-07-25 DIAGNOSIS — Z1231 Encounter for screening mammogram for malignant neoplasm of breast: Secondary | ICD-10-CM

## 2021-08-28 ENCOUNTER — Other Ambulatory Visit: Payer: Self-pay

## 2021-08-28 ENCOUNTER — Ambulatory Visit
Admission: RE | Admit: 2021-08-28 | Discharge: 2021-08-28 | Disposition: A | Discharge status: Home / Self Care | Payer: PPO | Source: Ambulatory Visit | Attending: Family Medicine | Admitting: Family Medicine

## 2021-08-28 DIAGNOSIS — Z1231 Encounter for screening mammogram for malignant neoplasm of breast: Secondary | ICD-10-CM | POA: Diagnosis not present

## 2021-08-31 ENCOUNTER — Other Ambulatory Visit: Payer: Self-pay | Admitting: Family Medicine

## 2021-08-31 DIAGNOSIS — N6489 Other specified disorders of breast: Secondary | ICD-10-CM

## 2021-08-31 DIAGNOSIS — R928 Other abnormal and inconclusive findings on diagnostic imaging of breast: Secondary | ICD-10-CM

## 2021-09-15 ENCOUNTER — Ambulatory Visit: Payer: PPO

## 2021-09-15 ENCOUNTER — Other Ambulatory Visit: Payer: PPO

## 2021-09-18 ENCOUNTER — Other Ambulatory Visit: Payer: Self-pay

## 2021-09-18 ENCOUNTER — Ambulatory Visit
Admission: RE | Admit: 2021-09-18 | Discharge: 2021-09-18 | Disposition: A | Discharge status: Home / Self Care | Payer: PPO | Source: Ambulatory Visit | Attending: Family Medicine | Admitting: Family Medicine

## 2021-09-18 DIAGNOSIS — R928 Other abnormal and inconclusive findings on diagnostic imaging of breast: Secondary | ICD-10-CM

## 2021-09-18 DIAGNOSIS — N6489 Other specified disorders of breast: Secondary | ICD-10-CM | POA: Diagnosis not present

## 2021-09-18 DIAGNOSIS — R922 Inconclusive mammogram: Secondary | ICD-10-CM | POA: Diagnosis not present

## 2021-10-26 DIAGNOSIS — E7800 Pure hypercholesterolemia, unspecified: Secondary | ICD-10-CM | POA: Diagnosis not present

## 2021-10-26 DIAGNOSIS — Z79899 Other long term (current) drug therapy: Secondary | ICD-10-CM | POA: Diagnosis not present

## 2021-11-02 DIAGNOSIS — Z0000 Encounter for general adult medical examination without abnormal findings: Secondary | ICD-10-CM | POA: Diagnosis not present

## 2021-11-02 DIAGNOSIS — Z23 Encounter for immunization: Secondary | ICD-10-CM | POA: Diagnosis not present

## 2021-11-02 DIAGNOSIS — I480 Paroxysmal atrial fibrillation: Secondary | ICD-10-CM | POA: Diagnosis not present

## 2021-11-02 DIAGNOSIS — Z1331 Encounter for screening for depression: Secondary | ICD-10-CM | POA: Diagnosis not present

## 2021-11-02 DIAGNOSIS — I5032 Chronic diastolic (congestive) heart failure: Secondary | ICD-10-CM | POA: Diagnosis not present

## 2021-11-05 NOTE — Progress Notes (Signed)
Cardiology Office Note  Date:  11/06/2021   ID:  Kayla Gray, DOB Jan 28, 1936, MRN [id number]  PCP:  Derinda Late, MD   Chief Complaint  Patient presents with   12 month follow up     "Doing well." Medications reviewed by the patient verbally.     HPI:  Kayla Gray is a very pleasant 86 year old woman with history of  severe aortic valve stenosis, status post AVR with bioprosthetic valve 09/16/2015 postoperative atrial fibrillation, started on amiodarone, warfarin syncope (stumbled backwards, fell back hit head, 2013),  hyperlipidemia,  iron deficiency anemia, peptic ulcer disease Status post left total knee replacement 11/15/2011 SVT in February 2013 admitted  elevated troponin felt secondary to demand ischemia from tachycardia.  February 2015 with TIA-type symptoms, 30 day monitor did not show significant arrhythmia Cardiac cath 08/2015: no CAD She presents today for  Syncope, SVT and bioprosthetic aortic valve  Last seen in clinic by myself January 2022 No recent visits to the hospital  Last echocardiogram January 2021 Normal ejection fraction, blood pressures, valve working well  Having issues with arthritis, knees, hips Unable to exercise No recent falls  Denies any tachycardia or palpitations concerning for atrial fibrillation or SVT 25 twice daily  Lab work reviewed with her in detail  on zetia Off pravastatin  Recent lab work reviewed from primary care Total cholesterol 170 LDL 98  EKG personally reviewed by myself on todays visit Normal sinus rhythm rate 68 bpm no significant ST-T wave changes  Other past medical history reviewed  October 20th, 2017  syncopal episode , out having supper at [employer]. Went to bathroom. Had abdominal pain, urgency, felt that she had to have a bowel movement.   lightheadedness, everything went white prior to having her bowel movement. Found herself on the floor, had released her bowels  emergency department at [facility]    brain CT scan was negative. Seen by neurology, had EEG performed   MRI  12/7    postoperative atrial fibrillation, started on amiodarone, warfarin not had any problems with her anticoagulation  Previous echocardiogram showing Peak velocity 510 cm/s, mean gradient 54 mmHg, peak gradient 104 mmHg.   She had knee replacement surgery early 2016   echocardiogram 01/ 8 2014 showed normal LV function, aortic valve velocity 315 cm/s, peak gradient 56 mm of mercury, mean gradient 19 mm of mercury, estimated aortic valve area 0.61 cm Echocardiogram in late 2014 showing velocity of 400     PMH:   has a past medical history of Aortic stenosis, Bilateral cataracts, Bleeding nose, Breast cancer ([place]) (1999), Chronic diastolic (congestive) heart failure ([place]), Depression, Diverticulosis, Gastroesophageal reflux disease, Heart murmur, Hyperlipidemia, Hypertension, Iron deficiency anemia, Obstructive sleep apnea, Osteoarthritis, Osteopenia, Peptic ulcer disease, Personal history of radiation therapy, S/P aortic valve replacement with bioprosthetic valve (09/16/2015), Skin cancer (2020), Stroke [facility]), Supraventricular tachycardia ([place]), Syncope and collapse, and Urinary tract infection.  PSH:    Past Surgical History:  Procedure Laterality Date   AORTIC VALVE REPLACEMENT N/A 09/16/2015   Procedure: AORTIC VALVE REPLACEMENT (AVR);  Surgeon: Rexene Alberts, MD;  Location: [place];  Service: Open Heart Surgery;  Laterality: N/A;   BREAST BIOPSY Right 1999   breast ca radation   BREAST EXCISIONAL BIOPSY Left yrs ago    benign   BREAST LUMPECTOMY Right 1999   with radiation   CARDIAC CATHETERIZATION N/A 08/17/2015   Procedure: Left Heart Cath and Coronary Angiography;  Surgeon: Minna Merritts, MD;  Location: [place]  CV LAB;  Service: Cardiovascular;  Laterality: N/A;   CATARACT EXTRACTION, BILATERAL     CHOLECYSTECTOMY     JOINT REPLACEMENT Bilateral    knee replacement   KNEE ARTHROSCOPY      right   KNEE ARTHROSCOPY     left   SKIN CANCER EXCISION  2020   TEE WITHOUT CARDIOVERSION N/A 09/16/2015   Procedure: TRANSESOPHAGEAL ECHOCARDIOGRAM (TEE);  Surgeon: Rexene Alberts, MD;  Location: [place];  Service: Open Heart Surgery;  Laterality: N/A;   TONSILLECTOMY     VAGINAL HYSTERECTOMY      Current Outpatient Medications  Medication Sig Dispense Refill   acetaminophen (TYLENOL) 325 MG tablet Take 650 mg by mouth every 6 (six) hours as needed.     aspirin EC 81 MG tablet Take 1 tablet (81 mg total) by mouth daily. 90 tablet 3   Calcium Carbonate-Vitamin D (CALCIUM 600 + D PO) Take by mouth 2 (two) times daily.     ezetimibe (ZETIA) 10 MG tablet Take 10 mg by mouth daily.     fluticasone (FLONASE) 50 MCG/ACT nasal spray Place 2 sprays into the nose daily as needed.     ketoconazole (NIZORAL) 2 % shampoo Apply topically 3 (three) times a week.     metoprolol tartrate (LOPRESSOR) 25 MG tablet TAKE 1 TABLET (25 MG TOTAL) BY MOUTH 2 (TWO) TIMES DAILY. 180 tablet 1   Multiple Vitamin (MULTIVITAMIN) tablet Take 1 tablet by mouth daily.     oxybutynin (DITROPAN-XL) 5 MG 24 hr tablet Take 5 mg by mouth daily.     PARoxetine (PAXIL) 20 MG tablet Take 20 mg by mouth daily.     amoxicillin (AMOXIL) 500 MG tablet Take 4 tablets (2,000 mg total) by mouth as directed. Take 2 hours before surgery or dental work (Patient not taking: Reported on 11/06/2021) 4 tablet 1   pantoprazole (PROTONIX) 40 MG tablet Take 40 mg by mouth daily as needed.  (Patient not taking: Reported on 11/06/2021)     No current facility-administered medications for this visit.    Allergies:   Evista [raloxifene], Sulfa antibiotics, Codeine, Nitrofurantoin, Oxycodone-acetaminophen, and Statins   Social History:  The patient  reports that she has never smoked. She has never used smokeless tobacco. She reports that she does not currently use alcohol. She reports that she does not use drugs.   Family History:   family history  includes Breast cancer in her maternal aunt; Breast cancer (age of onset: 42) in her daughter; Heart failure in her mother.    Review of Systems: Review of Systems  Constitutional: Negative.   HENT: Negative.    Respiratory: Negative.    Cardiovascular: Negative.   Gastrointestinal: Negative.   Musculoskeletal: Negative.   Neurological: Negative.   Psychiatric/Behavioral: Negative.    All other systems reviewed and are negative.  PHYSICAL EXAM: VS:  BP 120/72 (BP Location: Left Arm, Patient Position: Sitting, Cuff Size: Normal)    Pulse 68    Ht 5\' 2"  (1.575 m)    Wt 166 lb 6 oz (75.5 kg)    SpO2 96%    BMI 30.43 kg/m  , BMI Body mass index is 30.43 kg/m. Constitutional:  oriented to person, place, and time. No distress.  HENT:  Head: Grossly normal Eyes:  no discharge. No scleral icterus.  Neck: No JVD, no carotid bruits  Cardiovascular: Regular rate and rhythm, no murmurs appreciated Pulmonary/Chest: Clear to auscultation bilaterally, no wheezes or rails Abdominal: Soft.  no distension.  no tenderness.  Musculoskeletal: Normal range of motion Neurological:  normal muscle tone. Coordination normal. No atrophy Skin: Skin warm and dry Psychiatric: normal affect, pleasant  Recent Labs: No results found for requested labs within last 8760 hours.    Lipid Panel Lab Results  Component Value Date   CHOL 202 (H) 11/17/2013   HDL 41 11/17/2013   LDLCALC 129 (H) 11/17/2013   TRIG 159 11/17/2013    Wt Readings from Last 3 Encounters:  11/06/21 166 lb 6 oz (75.5 kg)  11/02/20 161 lb (73 kg)  08/30/20 160 lb (72.6 kg)     ASSESSMENT AND PLAN:  SVT (supraventricular tachycardia) ([place]) - Plan: EKG 12-Lead Prior history of SVT.  No recent episodes, Continue current dose of metoprolol tartrate  Paroxysmal atrial fibrillation ([facility]) - Plan: EKG 12-Lead Off warfarin Denies atrial fibrillation symptoms, on metoprolol  Syncope, unspecified syncope type - Plan: EKG 12-Lead   Remote vasovagal event in the setting of abdominal pain, bowel urgency Prior syncope with defecation No recent episodes of near syncope or syncope  Severe aortic valve stenosis S/P aortic valve replacement with bioprosthetic valve Prior echocardiogram reviewed, stable valve Plan repeat in follow-up No new clinical symptoms concerning for valvular heart disease   Total encounter time more than 25 minutes  Greater than 50% was spent in counseling and coordination of care with the patient    Orders Placed This Encounter  Procedures   EKG 12-Lead     Signed, Esmond Plants, M.D., Ph.D. 11/06/2021  [place], [place]

## 2021-11-06 ENCOUNTER — Encounter: Payer: Self-pay | Admitting: Cardiovascular Disease

## 2021-11-06 ENCOUNTER — Ambulatory Visit (INDEPENDENT_AMBULATORY_CARE_PROVIDER_SITE_OTHER): Payer: PPO | Admitting: Cardiovascular Disease

## 2021-11-06 ENCOUNTER — Other Ambulatory Visit: Payer: Self-pay

## 2021-11-06 VITALS — BP 120/72 | HR 68 | Ht 62.0 in | Wt 166.4 lb

## 2021-11-06 DIAGNOSIS — I5032 Chronic diastolic (congestive) heart failure: Secondary | ICD-10-CM | POA: Diagnosis not present

## 2021-11-06 DIAGNOSIS — I350 Nonrheumatic aortic (valve) stenosis: Secondary | ICD-10-CM | POA: Diagnosis not present

## 2021-11-06 DIAGNOSIS — Z953 Presence of xenogenic heart valve: Secondary | ICD-10-CM

## 2021-11-06 DIAGNOSIS — I471 Supraventricular tachycardia: Secondary | ICD-10-CM | POA: Diagnosis not present

## 2021-11-06 DIAGNOSIS — I6310 Cerebral infarction due to embolism of unspecified precerebral artery: Secondary | ICD-10-CM

## 2021-11-06 DIAGNOSIS — I480 Paroxysmal atrial fibrillation: Secondary | ICD-10-CM | POA: Diagnosis not present

## 2021-11-06 NOTE — Patient Instructions (Addendum)
Medication Instructions:  No changes  If you need a refill on your cardiac medications before your next appointment, please call your pharmacy.   Lab work: No new labs needed  Testing/Procedures: No new testing needed  Follow-Up: At [facility], you and your health needs are our priority.  As part of our continuing mission to provide you with exceptional heart care, we have created designated Provider Care Teams.  These Care Teams include your primary Cardiologist (physician) and Advanced Practice Providers (APPs -  Physician Assistants and Nurse Practitioners) who all work together to provide you with the care you need, when you need it.  You will need a follow up appointment in 1 year  Providers on your designated Care Team:   Murray Hodgkins, NP Christell Faith, PA-C Cadence Kathlen Mody, [place]  COVID-19 Vaccine Information can be found at: [email or web address] For questions related to vaccine distribution or appointments, please email [email or web address] or call [phone].

## 2021-11-07 DIAGNOSIS — I739 Peripheral vascular disease, unspecified: Secondary | ICD-10-CM | POA: Diagnosis not present

## 2021-11-07 DIAGNOSIS — Z7982 Long term (current) use of aspirin: Secondary | ICD-10-CM | POA: Diagnosis not present

## 2021-11-07 DIAGNOSIS — I4891 Unspecified atrial fibrillation: Secondary | ICD-10-CM | POA: Diagnosis not present

## 2021-11-07 DIAGNOSIS — I25118 Atherosclerotic heart disease of native coronary artery with other forms of angina pectoris: Secondary | ICD-10-CM | POA: Diagnosis not present

## 2021-12-15 ENCOUNTER — Other Ambulatory Visit: Payer: Self-pay | Admitting: Cardiovascular Disease

## 2021-12-18 ENCOUNTER — Emergency Department: Payer: PPO | Admitting: Anesthesiology

## 2021-12-18 ENCOUNTER — Other Ambulatory Visit: Payer: Self-pay

## 2021-12-18 ENCOUNTER — Inpatient Hospital Stay
Admission: EM | Admit: 2021-12-18 | Discharge: 2021-12-25 | DRG: 025 | Disposition: A | Discharge status: Rehabilitation Facility | Payer: PPO | Attending: Neurosurgery | Admitting: Neurosurgery

## 2021-12-18 ENCOUNTER — Encounter: Payer: Self-pay | Admitting: Emergency Medicine

## 2021-12-18 ENCOUNTER — Emergency Department: Payer: PPO

## 2021-12-18 ENCOUNTER — Inpatient Hospital Stay: Payer: PPO

## 2021-12-18 ENCOUNTER — Encounter
Admission: EM | Disposition: A | Discharge status: Rehabilitation Facility | Payer: Self-pay | Source: Home / Self Care | Attending: Neurosurgery

## 2021-12-18 DIAGNOSIS — I5032 Chronic diastolic (congestive) heart failure: Secondary | ICD-10-CM | POA: Diagnosis not present

## 2021-12-18 DIAGNOSIS — S065X0A Traumatic subdural hemorrhage without loss of consciousness, initial encounter: Secondary | ICD-10-CM | POA: Diagnosis not present

## 2021-12-18 DIAGNOSIS — Z885 Allergy status to narcotic agent status: Secondary | ICD-10-CM

## 2021-12-18 DIAGNOSIS — M47812 Spondylosis without myelopathy or radiculopathy, cervical region: Secondary | ICD-10-CM | POA: Diagnosis not present

## 2021-12-18 DIAGNOSIS — Z8673 Personal history of transient ischemic attack (TIA), and cerebral infarction without residual deficits: Secondary | ICD-10-CM | POA: Diagnosis not present

## 2021-12-18 DIAGNOSIS — I10 Essential (primary) hypertension: Secondary | ICD-10-CM

## 2021-12-18 DIAGNOSIS — F32A Depression, unspecified: Secondary | ICD-10-CM | POA: Diagnosis not present

## 2021-12-18 DIAGNOSIS — Z923 Personal history of irradiation: Secondary | ICD-10-CM

## 2021-12-18 DIAGNOSIS — I480 Paroxysmal atrial fibrillation: Secondary | ICD-10-CM | POA: Diagnosis not present

## 2021-12-18 DIAGNOSIS — Z85828 Personal history of other malignant neoplasm of skin: Secondary | ICD-10-CM | POA: Diagnosis not present

## 2021-12-18 DIAGNOSIS — Z953 Presence of xenogenic heart valve: Secondary | ICD-10-CM | POA: Diagnosis not present

## 2021-12-18 DIAGNOSIS — J811 Chronic pulmonary edema: Secondary | ICD-10-CM | POA: Diagnosis not present

## 2021-12-18 DIAGNOSIS — E785 Hyperlipidemia, unspecified: Secondary | ICD-10-CM | POA: Diagnosis present

## 2021-12-18 DIAGNOSIS — G4733 Obstructive sleep apnea (adult) (pediatric): Secondary | ICD-10-CM | POA: Diagnosis not present

## 2021-12-18 DIAGNOSIS — J9589 Other postprocedural complications and disorders of respiratory system, not elsewhere classified: Secondary | ICD-10-CM | POA: Diagnosis not present

## 2021-12-18 DIAGNOSIS — Z853 Personal history of malignant neoplasm of breast: Secondary | ICD-10-CM | POA: Diagnosis not present

## 2021-12-18 DIAGNOSIS — R42 Dizziness and giddiness: Secondary | ICD-10-CM | POA: Diagnosis not present

## 2021-12-18 DIAGNOSIS — R1111 Vomiting without nausea: Secondary | ICD-10-CM | POA: Diagnosis not present

## 2021-12-18 DIAGNOSIS — Z043 Encounter for examination and observation following other accident: Secondary | ICD-10-CM | POA: Diagnosis not present

## 2021-12-18 DIAGNOSIS — S065XAA Traumatic subdural hemorrhage with loss of consciousness status unknown, initial encounter: Secondary | ICD-10-CM | POA: Diagnosis not present

## 2021-12-18 DIAGNOSIS — R55 Syncope and collapse: Secondary | ICD-10-CM | POA: Diagnosis present

## 2021-12-18 DIAGNOSIS — Z23 Encounter for immunization: Secondary | ICD-10-CM | POA: Diagnosis not present

## 2021-12-18 DIAGNOSIS — Z90710 Acquired absence of both cervix and uterus: Secondary | ICD-10-CM

## 2021-12-18 DIAGNOSIS — Z888 Allergy status to other drugs, medicaments and biological substances status: Secondary | ICD-10-CM

## 2021-12-18 DIAGNOSIS — D62 Acute posthemorrhagic anemia: Secondary | ICD-10-CM | POA: Diagnosis not present

## 2021-12-18 DIAGNOSIS — J95821 Acute postprocedural respiratory failure: Secondary | ICD-10-CM | POA: Diagnosis not present

## 2021-12-18 DIAGNOSIS — I6201 Nontraumatic acute subdural hemorrhage: Secondary | ICD-10-CM | POA: Diagnosis not present

## 2021-12-18 DIAGNOSIS — M7989 Other specified soft tissue disorders: Secondary | ICD-10-CM | POA: Diagnosis not present

## 2021-12-18 DIAGNOSIS — Z882 Allergy status to sulfonamides status: Secondary | ICD-10-CM

## 2021-12-18 DIAGNOSIS — T797XXA Traumatic subcutaneous emphysema, initial encounter: Secondary | ICD-10-CM | POA: Diagnosis not present

## 2021-12-18 DIAGNOSIS — W19XXXA Unspecified fall, initial encounter: Secondary | ICD-10-CM

## 2021-12-18 DIAGNOSIS — Z79899 Other long term (current) drug therapy: Secondary | ICD-10-CM

## 2021-12-18 DIAGNOSIS — G9340 Encephalopathy, unspecified: Secondary | ICD-10-CM | POA: Diagnosis not present

## 2021-12-18 DIAGNOSIS — M1990 Unspecified osteoarthritis, unspecified site: Secondary | ICD-10-CM | POA: Diagnosis present

## 2021-12-18 DIAGNOSIS — G8194 Hemiplegia, unspecified affecting left nondominant side: Secondary | ICD-10-CM | POA: Diagnosis present

## 2021-12-18 DIAGNOSIS — R52 Pain, unspecified: Secondary | ICD-10-CM | POA: Diagnosis not present

## 2021-12-18 DIAGNOSIS — M4312 Spondylolisthesis, cervical region: Secondary | ICD-10-CM | POA: Diagnosis not present

## 2021-12-18 DIAGNOSIS — M2578 Osteophyte, vertebrae: Secondary | ICD-10-CM | POA: Diagnosis not present

## 2021-12-18 DIAGNOSIS — Z20822 Contact with and (suspected) exposure to covid-19: Secondary | ICD-10-CM | POA: Diagnosis present

## 2021-12-18 DIAGNOSIS — J9811 Atelectasis: Secondary | ICD-10-CM | POA: Diagnosis not present

## 2021-12-18 DIAGNOSIS — G9389 Other specified disorders of brain: Secondary | ICD-10-CM | POA: Diagnosis not present

## 2021-12-18 DIAGNOSIS — W010XXA Fall on same level from slipping, tripping and stumbling without subsequent striking against object, initial encounter: Secondary | ICD-10-CM | POA: Diagnosis present

## 2021-12-18 DIAGNOSIS — Z9889 Other specified postprocedural states: Secondary | ICD-10-CM | POA: Diagnosis not present

## 2021-12-18 DIAGNOSIS — Z9049 Acquired absence of other specified parts of digestive tract: Secondary | ICD-10-CM

## 2021-12-18 DIAGNOSIS — I110 Hypertensive heart disease with heart failure: Secondary | ICD-10-CM | POA: Diagnosis not present

## 2021-12-18 DIAGNOSIS — R569 Unspecified convulsions: Secondary | ICD-10-CM | POA: Diagnosis not present

## 2021-12-18 DIAGNOSIS — I471 Supraventricular tachycardia: Secondary | ICD-10-CM | POA: Diagnosis not present

## 2021-12-18 DIAGNOSIS — G935 Compression of brain: Secondary | ICD-10-CM | POA: Diagnosis not present

## 2021-12-18 DIAGNOSIS — I6200 Nontraumatic subdural hemorrhage, unspecified: Secondary | ICD-10-CM | POA: Diagnosis not present

## 2021-12-18 DIAGNOSIS — K219 Gastro-esophageal reflux disease without esophagitis: Secondary | ICD-10-CM | POA: Diagnosis not present

## 2021-12-18 DIAGNOSIS — R1312 Dysphagia, oropharyngeal phase: Secondary | ICD-10-CM | POA: Diagnosis not present

## 2021-12-18 DIAGNOSIS — I639 Cerebral infarction, unspecified: Secondary | ICD-10-CM | POA: Diagnosis not present

## 2021-12-18 DIAGNOSIS — Z7982 Long term (current) use of aspirin: Secondary | ICD-10-CM

## 2021-12-18 DIAGNOSIS — S0003XA Contusion of scalp, initial encounter: Secondary | ICD-10-CM | POA: Diagnosis not present

## 2021-12-18 DIAGNOSIS — Z96653 Presence of artificial knee joint, bilateral: Secondary | ICD-10-CM | POA: Diagnosis present

## 2021-12-18 HISTORY — PX: CRANIOTOMY: SHX93

## 2021-12-18 LAB — BASIC METABOLIC PANEL
Anion gap: 5 (ref 5–15)
BUN: 18 mg/dL (ref 8–23)
CO2: 27 mmol/L (ref 22–32)
Calcium: 8.9 mg/dL (ref 8.9–10.3)
Chloride: 106 mmol/L (ref 98–111)
Creatinine, Ser: 0.91 mg/dL (ref 0.44–1.00)
GFR, Estimated: >60 mL/min (ref >60)
Glucose, Bld: 120 mg/dL — ABNORMAL HIGH (ref 70–99)
Potassium: 3.8 mmol/L (ref 3.5–5.1)
Sodium: 138 mmol/L (ref 135–145)

## 2021-12-18 LAB — TROPONIN I (HIGH SENSITIVITY): Troponin I (High Sensitivity): 8 ng/L (ref <18)

## 2021-12-18 LAB — RESP PANEL BY RT-PCR (FLU A&B, COVID) ARPGX2
Influenza A by PCR: NEGATIVE
Influenza B by PCR: NEGATIVE
SARS Coronavirus 2 by RT PCR: NEGATIVE

## 2021-12-18 LAB — CBC WITH DIFFERENTIAL/PLATELET
Abs Immature Granulocytes: 0.02 10*3/uL (ref 0.00–0.07)
Basophils Absolute: 0 10*3/uL (ref 0.0–0.1)
Basophils Relative: 1 %
Eosinophils Absolute: 0.1 10*3/uL (ref 0.0–0.5)
Eosinophils Relative: 2 %
HCT: 37.5 % (ref 36.0–46.0)
Hemoglobin: 11.8 g/dL — ABNORMAL LOW (ref 12.0–15.0)
Immature Granulocytes: 0 %
Lymphocytes Relative: 15 %
Lymphs Abs: 1.4 10*3/uL (ref 0.7–4.0)
MCH: 27.8 pg (ref 26.0–34.0)
MCHC: 31.5 g/dL (ref 30.0–36.0)
MCV: 88.2 fL (ref 80.0–100.0)
Monocytes Absolute: 0.5 10*3/uL (ref 0.1–1.0)
Monocytes Relative: 6 %
Neutro Abs: 6.8 10*3/uL (ref 1.7–7.7)
Neutrophils Relative %: 76 %
Platelets: 155 10*3/uL (ref 150–400)
RBC: 4.25 MIL/uL (ref 3.87–5.11)
RDW: 14.5 % (ref 11.5–15.5)
WBC: 8.9 10*3/uL (ref 4.0–10.5)
nRBC: 0 % (ref 0.0–0.2)

## 2021-12-18 LAB — PROTIME-INR
INR: 1.1 (ref 0.8–1.2)
Prothrombin Time: 13.8 seconds (ref 11.4–15.2)

## 2021-12-18 LAB — MRSA NEXT GEN BY PCR, NASAL: MRSA by PCR Next Gen: NOT DETECTED

## 2021-12-18 LAB — CBG MONITORING, ED: Glucose-Capillary: 199 mg/dL — ABNORMAL HIGH (ref 70–99)

## 2021-12-18 SURGERY — CRANIOTOMY HEMATOMA EVACUATION SUBDURAL
Anesthesia: General | Site: Head | Laterality: Right

## 2021-12-18 MED ORDER — ONDANSETRON HCL 4 MG/2ML IJ SOLN
4.0000 mg | INTRAMUSCULAR | Status: DC | PRN
  Administered 2021-12-18 – 2021-12-20 (×4): 4 mg via INTRAVENOUS
  Filled 2021-12-18 (×4): qty 2

## 2021-12-18 MED ORDER — LABETALOL HCL 5 MG/ML IV SOLN
10.0000 mg | INTRAVENOUS | Status: DC | PRN
  Administered 2021-12-18 – 2021-12-19 (×2): 10 mg via INTRAVENOUS
  Administered 2021-12-19: 20 mg via INTRAVENOUS
  Administered 2021-12-19: 10 mg via INTRAVENOUS
  Administered 2021-12-19 – 2021-12-20 (×3): 20 mg via INTRAVENOUS
  Filled 2021-12-18 (×7): qty 4

## 2021-12-18 MED ORDER — LEVETIRACETAM IN NACL 500 MG/100ML IV SOLN
500.0000 mg | Freq: Two times a day (BID) | INTRAVENOUS | Status: DC
  Administered 2021-12-18 – 2021-12-25 (×14): 500 mg via INTRAVENOUS
  Filled 2021-12-18 (×16): qty 100

## 2021-12-18 MED ORDER — SUGAMMADEX SODIUM 200 MG/2ML IV SOLN
INTRAVENOUS | Status: DC | PRN
  Administered 2021-12-18: 200 mg via INTRAVENOUS

## 2021-12-18 MED ORDER — MANNITOL 20 % IV SOLN: INTRAVENOUS | Status: DC | PRN

## 2021-12-18 MED ORDER — ACETAMINOPHEN 650 MG RE SUPP: 650.0000 mg | RECTAL | Status: DC | PRN

## 2021-12-18 MED ORDER — SENNA 8.6 MG PO TABS
1.0000 | ORAL_TABLET | Freq: Two times a day (BID) | ORAL | Status: DC
  Administered 2021-12-18 – 2021-12-25 (×8): 8.6 mg via ORAL
  Filled 2021-12-18 (×10): qty 1

## 2021-12-18 MED ORDER — SUCCINYLCHOLINE CHLORIDE 200 MG/10ML IV SOSY
PREFILLED_SYRINGE | INTRAVENOUS | Status: DC | PRN
  Administered 2021-12-18: 100 mg via INTRAVENOUS

## 2021-12-18 MED ORDER — DEXAMETHASONE SODIUM PHOSPHATE 10 MG/ML IJ SOLN
INTRAMUSCULAR | Status: DC | PRN
  Administered 2021-12-18: 10 mg via INTRAVENOUS

## 2021-12-18 MED ORDER — CEFAZOLIN SODIUM-DEXTROSE 2-3 GM-%(50ML) IV SOLR
INTRAVENOUS | Status: DC | PRN
  Administered 2021-12-18: 2 g via INTRAVENOUS

## 2021-12-18 MED ORDER — BISACODYL 10 MG RE SUPP: 10.0000 mg | Freq: Every day | RECTAL | Status: DC | PRN

## 2021-12-18 MED ORDER — ACETAMINOPHEN 325 MG PO TABS
650.0000 mg | ORAL_TABLET | ORAL | Status: DC | PRN
  Administered 2021-12-20 – 2021-12-23 (×4): 650 mg via ORAL
  Filled 2021-12-18 (×4): qty 2

## 2021-12-18 MED ORDER — PROMETHAZINE HCL 25 MG PO TABS
12.5000 mg | ORAL_TABLET | ORAL | Status: DC | PRN
  Filled 2021-12-18: qty 1

## 2021-12-18 MED ORDER — OXYBUTYNIN CHLORIDE ER 5 MG PO TB24
5.0000 mg | ORAL_TABLET | Freq: Every day | ORAL | Status: DC
  Administered 2021-12-20 – 2021-12-25 (×6): 5 mg via ORAL
  Filled 2021-12-18 (×7): qty 1

## 2021-12-18 MED ORDER — PHENYLEPHRINE HCL-NACL 20-0.9 MG/250ML-% IV SOLN
INTRAVENOUS | Status: AC
  Filled 2021-12-18: qty 250

## 2021-12-18 MED ORDER — ADULT MULTIVITAMIN W/MINERALS CH
1.0000 | ORAL_TABLET | Freq: Every day | ORAL | Status: DC
  Administered 2021-12-20 – 2021-12-25 (×6): 1 via ORAL
  Filled 2021-12-18 (×6): qty 1

## 2021-12-18 MED ORDER — BACITRACIN 500 UNIT/GM EX OINT
TOPICAL_OINTMENT | CUTANEOUS | Status: DC | PRN
  Administered 2021-12-18: 1 via TOPICAL

## 2021-12-18 MED ORDER — FENTANYL CITRATE (PF) 100 MCG/2ML IJ SOLN
INTRAMUSCULAR | Status: AC
  Filled 2021-12-18: qty 2

## 2021-12-18 MED ORDER — FLEET ENEMA 7-19 GM/118ML RE ENEM: 1.0000 | ENEMA | Freq: Once | RECTAL | Status: DC | PRN

## 2021-12-18 MED ORDER — LIDOCAINE HCL (CARDIAC) PF 100 MG/5ML IV SOSY
PREFILLED_SYRINGE | INTRAVENOUS | Status: DC | PRN
  Administered 2021-12-18: 50 mg via INTRAVENOUS

## 2021-12-18 MED ORDER — ACETAMINOPHEN 10 MG/ML IV SOLN
INTRAVENOUS | Status: AC
  Filled 2021-12-18: qty 100

## 2021-12-18 MED ORDER — MORPHINE SULFATE (PF) 2 MG/ML IV SOLN
1.0000 mg | INTRAVENOUS | Status: DC | PRN
  Administered 2021-12-18: 1 mg via INTRAVENOUS
  Administered 2021-12-19: 2 mg via INTRAVENOUS
  Filled 2021-12-18 (×2): qty 1

## 2021-12-18 MED ORDER — SEVOFLURANE IN SOLN
RESPIRATORY_TRACT | Status: AC
  Filled 2021-12-18: qty 250

## 2021-12-18 MED ORDER — ONDANSETRON HCL 4 MG/2ML IJ SOLN: 4.0000 mg | Freq: Once | INTRAMUSCULAR | Status: AC

## 2021-12-18 MED ORDER — HEMOSTATIC AGENTS (NO CHARGE) OPTIME
TOPICAL | Status: DC | PRN
  Administered 2021-12-18: 1 via TOPICAL

## 2021-12-18 MED ORDER — ONDANSETRON HCL 4 MG/2ML IJ SOLN
INTRAMUSCULAR | Status: DC | PRN
  Administered 2021-12-18: 4 mg via INTRAVENOUS

## 2021-12-18 MED ORDER — FENTANYL CITRATE (PF) 100 MCG/2ML IJ SOLN
INTRAMUSCULAR | Status: DC | PRN
  Administered 2021-12-18 (×2): 50 ug via INTRAVENOUS
  Administered 2021-12-18: 25 ug via INTRAVENOUS

## 2021-12-18 MED ORDER — LIDOCAINE-EPINEPHRINE 1 %-1:100000 IJ SOLN
INTRAMUSCULAR | Status: AC
  Filled 2021-12-18: qty 1

## 2021-12-18 MED ORDER — METOPROLOL TARTRATE 25 MG PO TABS
25.0000 mg | ORAL_TABLET | Freq: Two times a day (BID) | ORAL | Status: DC
  Administered 2021-12-18 – 2021-12-25 (×12): 25 mg via ORAL
  Filled 2021-12-18 (×12): qty 1

## 2021-12-18 MED ORDER — ONDANSETRON HCL 4 MG PO TABS
4.0000 mg | ORAL_TABLET | ORAL | Status: DC | PRN
  Administered 2021-12-18: 4 mg via ORAL
  Filled 2021-12-18: qty 1

## 2021-12-18 MED ORDER — SODIUM CHLORIDE 0.9 % IV SOLN: INTRAVENOUS | Status: DC | PRN

## 2021-12-18 MED ORDER — ACETAMINOPHEN 10 MG/ML IV SOLN
INTRAVENOUS | Status: DC | PRN
  Administered 2021-12-18: 1000 mg via INTRAVENOUS

## 2021-12-18 MED ORDER — TETANUS-DIPHTH-ACELL PERTUSSIS 5-2.5-18.5 LF-MCG/0.5 IM SUSY: 0.5000 mL | PREFILLED_SYRINGE | Freq: Once | INTRAMUSCULAR | Status: DC

## 2021-12-18 MED ORDER — LEVETIRACETAM 500 MG/5ML IV SOLN
INTRAVENOUS | Status: AC
  Filled 2021-12-18: qty 5

## 2021-12-18 MED ORDER — NICARDIPINE HCL IN NACL 20-0.86 MG/200ML-% IV SOLN
0.0000 mg/h | INTRAVENOUS | Status: DC
  Administered 2021-12-18: 5 mg/h via INTRAVENOUS
  Filled 2021-12-18: qty 200

## 2021-12-18 MED ORDER — PHENYLEPHRINE 40 MCG/ML (10ML) SYRINGE FOR IV PUSH (FOR BLOOD PRESSURE SUPPORT)
PREFILLED_SYRINGE | INTRAVENOUS | Status: DC | PRN
  Administered 2021-12-18 (×5): 80 ug via INTRAVENOUS

## 2021-12-18 MED ORDER — PROPOFOL 10 MG/ML IV BOLUS
INTRAVENOUS | Status: DC | PRN
  Administered 2021-12-18: 100 mg via INTRAVENOUS
  Administered 2021-12-18: 20 mg via INTRAVENOUS

## 2021-12-18 MED ORDER — ONDANSETRON HCL 4 MG/2ML IJ SOLN
INTRAMUSCULAR | Status: AC
  Administered 2021-12-18: 4 mg
  Filled 2021-12-18: qty 2

## 2021-12-18 MED ORDER — BACITRACIN ZINC 500 UNIT/GM EX OINT
TOPICAL_OINTMENT | CUTANEOUS | Status: AC
  Filled 2021-12-18: qty 28.35

## 2021-12-18 MED ORDER — POLYETHYLENE GLYCOL 3350 17 G PO PACK: 17.0000 g | PACK | Freq: Every day | ORAL | Status: DC | PRN

## 2021-12-18 MED ORDER — LACTATED RINGERS IV SOLN: INTRAVENOUS | Status: DC | PRN

## 2021-12-18 MED ORDER — SODIUM CHLORIDE 0.9 % IV SOLN
INTRAVENOUS | Status: DC | PRN
  Administered 2021-12-18: 500 mg via INTRAVENOUS

## 2021-12-18 MED ORDER — EZETIMIBE 10 MG PO TABS
10.0000 mg | ORAL_TABLET | Freq: Every day | ORAL | Status: DC
Start: 2021-12-19 — End: 2021-12-25
  Administered 2021-12-20 – 2021-12-25 (×6): 10 mg via ORAL
  Filled 2021-12-18 (×7): qty 1

## 2021-12-18 MED ORDER — NICARDIPINE HCL IN NACL 20-0.86 MG/200ML-% IV SOLN
INTRAVENOUS | Status: AC
  Filled 2021-12-18: qty 200

## 2021-12-18 MED ORDER — PHENYLEPHRINE HCL-NACL 20-0.9 MG/250ML-% IV SOLN
INTRAVENOUS | Status: DC | PRN
  Administered 2021-12-18: 20 ug/min via INTRAVENOUS

## 2021-12-18 MED ORDER — CHLORHEXIDINE GLUCONATE CLOTH 2 % EX PADS
6.0000 | MEDICATED_PAD | Freq: Every day | CUTANEOUS | Status: DC
  Administered 2021-12-18 – 2021-12-25 (×8): 6 via TOPICAL

## 2021-12-18 MED ORDER — LIDOCAINE-EPINEPHRINE 1 %-1:100000 IJ SOLN
INTRAMUSCULAR | Status: DC | PRN
  Administered 2021-12-18: 9 mL

## 2021-12-18 MED ORDER — ROCURONIUM BROMIDE 100 MG/10ML IV SOLN
INTRAVENOUS | Status: DC | PRN
  Administered 2021-12-18: 20 mg via INTRAVENOUS

## 2021-12-18 MED ORDER — NALOXONE HCL 0.4 MG/ML IJ SOLN
0.0800 mg | INTRAMUSCULAR | Status: DC | PRN
  Filled 2021-12-18: qty 1

## 2021-12-18 MED ORDER — PAROXETINE HCL 20 MG PO TABS
20.0000 mg | ORAL_TABLET | Freq: Every day | ORAL | Status: DC
  Administered 2021-12-20 – 2021-12-25 (×6): 20 mg via ORAL
  Filled 2021-12-18 (×7): qty 1

## 2021-12-18 MED ORDER — SODIUM CHLORIDE 0.9 % IV SOLN
12.5000 mg | Freq: Four times a day (QID) | INTRAVENOUS | Status: DC | PRN
  Administered 2021-12-19 (×2): 12.5 mg via INTRAVENOUS
  Filled 2021-12-18 (×2): qty 12.5
  Filled 2021-12-18 (×2): qty 0.5

## 2021-12-18 MED ORDER — HYDROCODONE-ACETAMINOPHEN 5-325 MG PO TABS
1.0000 | ORAL_TABLET | ORAL | Status: DC | PRN
  Administered 2021-12-18: 1 via ORAL
  Filled 2021-12-18: qty 1

## 2021-12-18 MED ORDER — MANNITOL 20 % IV SOLN
INTRAVENOUS | Status: AC
  Filled 2021-12-18: qty 500

## 2021-12-18 MED ORDER — 0.9 % SODIUM CHLORIDE (POUR BTL) OPTIME
TOPICAL | Status: DC | PRN
  Administered 2021-12-18: 1000 mL
  Administered 2021-12-18: 500 mL
  Administered 2021-12-18: 1000 mL

## 2021-12-18 SURGICAL SUPPLY — 90 items
AGENT HMST KT MTR STRL THRMB (HEMOSTASIS) ×2
APL PRP STRL LF ISPRP CHG 10.5 (MISCELLANEOUS) ×2
APPLICATOR CHLORAPREP 10.5 ORG (MISCELLANEOUS) ×4 IMPLANT
BASIN GRAD PLASTIC 32OZ STRL (MISCELLANEOUS) ×3 IMPLANT
BLADE CLIPPER SPEC (BLADE) ×2 IMPLANT
BLADE SURG 15 STRL LF DISP TIS (BLADE) ×1 IMPLANT
BLADE SURG 15 STRL SS (BLADE) ×2
BNDG CMPR 75X41 PLY HI ABS (GAUZE/BANDAGES/DRESSINGS) ×1
BNDG STRETCH 4X75 STRL LF (GAUZE/BANDAGES/DRESSINGS) ×1 IMPLANT
BULB RESERV EVAC DRAIN JP 100C (MISCELLANEOUS) ×1 IMPLANT
BUR ACORN 7.5 PRECISION (BURR) ×2 IMPLANT
BUR SPIRAL ROUTER 2.3 (BUR) ×2 IMPLANT
CNTNR SPEC 2.5X3XGRAD LEK (MISCELLANEOUS) ×3
CONT SPEC 4OZ STER OR WHT (MISCELLANEOUS) ×3
CONT SPEC 4OZ STRL OR WHT (MISCELLANEOUS) ×3
CONTAINER SPEC 2.5X3XGRAD LEK (MISCELLANEOUS) ×3 IMPLANT
COUNTER NEEDLE 20/40 LG (NEEDLE) ×2 IMPLANT
DRAIN CHANNEL JP 10F RND 20C F (MISCELLANEOUS) ×1 IMPLANT
DRAIN JP 10F RND SILICONE (MISCELLANEOUS) IMPLANT
DRAPE INCISE 23X17 IOBAN STRL (DRAPES) ×2
DRAPE INCISE 23X17 STRL (DRAPES) ×2 IMPLANT
DRAPE INCISE IOBAN 23X17 STRL (DRAPES) ×2 IMPLANT
DRAPE INCISE IOBAN 66X45 STRL (DRAPES) ×2 IMPLANT
DRAPE SURG 17X11 SM STRL (DRAPES) ×8 IMPLANT
DRAPE WARM FLUID 44X44 (DRAPES) ×2 IMPLANT
DRSG TEGADERM 4X4.75 (GAUZE/BANDAGES/DRESSINGS) ×2 IMPLANT
DRSG TEGADERM 6X8 (GAUZE/BANDAGES/DRESSINGS) ×1 IMPLANT
DRSG TELFA 3X8 NADH (GAUZE/BANDAGES/DRESSINGS) ×2 IMPLANT
ELECT CAUTERY BLADE TIP 2.5 (TIP) ×2
ELECT REM PT RETURN 9FT ADLT (ELECTROSURGICAL) ×2
ELECTRODE CAUTERY BLDE TIP 2.5 (TIP) ×1 IMPLANT
ELECTRODE REM PT RTRN 9FT ADLT (ELECTROSURGICAL) ×1 IMPLANT
GAUZE 4X4 16PLY ~~LOC~~+RFID DBL (SPONGE) ×6 IMPLANT
GAUZE XEROFORM 1X8 LF (GAUZE/BANDAGES/DRESSINGS) ×2 IMPLANT
GLOVE SURG SYN 6.5 ES PF (GLOVE) ×2 IMPLANT
GLOVE SURG SYN 6.5 PF PI (GLOVE) ×1 IMPLANT
GLOVE SURG SYN 8.5  E (GLOVE) ×4
GLOVE SURG SYN 8.5 E (GLOVE) ×4 IMPLANT
GLOVE SURG SYN 8.5 PF PI (GLOVE) ×4 IMPLANT
GLOVE SURG UNDER POLY LF SZ6.5 (GLOVE) ×2 IMPLANT
GLOVE SURG UNDER POLY LF SZ8.5 (GLOVE) ×2 IMPLANT
GOWN SRG LRG LVL 4 IMPRV REINF (GOWNS) ×1 IMPLANT
GOWN SRG XL LVL 3 NONREINFORCE (GOWNS) ×1 IMPLANT
GOWN STRL NON-REIN TWL XL LVL3 (GOWNS) ×2
GOWN STRL REIN LRG LVL4 (GOWNS) ×2
GRADUATE 1200CC STRL 31836 (MISCELLANEOUS) ×2 IMPLANT
GRAFT DURAGEN MATRIX 3WX3L (Graft) ×2 IMPLANT
GRAFT DURAGEN MATRIX 3X3 SNGL (Graft) IMPLANT
HEMOSTAT SURGICEL 2X14 (HEMOSTASIS) ×1 IMPLANT
HEMOSTAT SURGICEL 2X3 (HEMOSTASIS) IMPLANT
HEMOSTAT SURGICEL 4X8 (HEMOSTASIS) ×1 IMPLANT
HOLDER FOLEY CATH W/STRAP (MISCELLANEOUS) ×1 IMPLANT
HOOK STAY BLUNT/RETRACTOR 5M (MISCELLANEOUS) ×3 IMPLANT
KIT TURNOVER KIT A (KITS) ×2 IMPLANT
MANIFOLD NEPTUNE II (INSTRUMENTS) ×2 IMPLANT
MARKER SKIN DUAL TIP RULER LAB (MISCELLANEOUS) ×4 IMPLANT
MAT ABSORB  FLUID 56X50 GRAY (MISCELLANEOUS) ×1
MAT ABSORB FLUID 56X50 GRAY (MISCELLANEOUS) ×1 IMPLANT
NEEDLE HYPO 22GX1.5 SAFETY (NEEDLE) ×2 IMPLANT
NS IRRIG 1000ML POUR BTL (IV SOLUTION) ×2 IMPLANT
PACK CRANIOTOMY CUSTOM (CUSTOM PROCEDURE TRAY) ×2 IMPLANT
PAD ARMBOARD 7.5X6 YLW CONV (MISCELLANEOUS) ×4 IMPLANT
PAD DRESSING TELFA 3X8 NADH (GAUZE/BANDAGES/DRESSINGS) IMPLANT
PIN MAYFIELD SKULL DISP (PIN) ×1 IMPLANT
PLATE 1.5/0.5 13MM BURR HOLE (Plate) ×2 IMPLANT
PLATE 1.5/0.5 18.5MM BURR HOLE (Plate) ×2 IMPLANT
SCREW SELF DRILL HT 1.5/4MM (Screw) ×13 IMPLANT
SET CATH VENT DRAIN 3-15 1.9D (DRAIN) IMPLANT
SHEET NEURO XL SOL CTL (MISCELLANEOUS) ×2 IMPLANT
SOL PREP PROV IODINE SCRUB 4OZ (MISCELLANEOUS) ×1 IMPLANT
SOL PREP PVP 2OZ (MISCELLANEOUS) ×2
SOL SCRUB PVP POV-IOD 4OZ 7.5% (MISCELLANEOUS) ×2
SOLUTION PREP PVP 2OZ (MISCELLANEOUS) ×1 IMPLANT
SOLUTION SCRB POV-IOD 4OZ 7.5% (MISCELLANEOUS) IMPLANT
SPONGE NEURO XRAY DETECT 1X3 (DISPOSABLE) ×1 IMPLANT
STAPLER SKIN PROX 35W (STAPLE) ×6 IMPLANT
SURGIFLO W/THROMBIN 8M KIT (HEMOSTASIS) ×4 IMPLANT
SURGILUBE 2OZ TUBE FLIPTOP (MISCELLANEOUS) ×2 IMPLANT
SUT ETHILON 3-0 FS-10 30 BLK (SUTURE) ×2
SUT NURALON 4 0 TR CR/8 (SUTURE) ×3 IMPLANT
SUT VIC AB 2-0 CT1 18 (SUTURE) ×7 IMPLANT
SUTURE EHLN 3-0 FS-10 30 BLK (SUTURE) IMPLANT
SYR 10ML LL (SYRINGE) ×2 IMPLANT
SYR 20ML LL LF (SYRINGE) ×4 IMPLANT
TAPE CLOTH 3X10 WHT NS LF (GAUZE/BANDAGES/DRESSINGS) ×2 IMPLANT
TOWEL OR 17X26 4PK STRL BLUE (TOWEL DISPOSABLE) ×8 IMPLANT
TRAY FOLEY SLVR 16FR TEMP STAT (SET/KITS/TRAYS/PACK) ×2 IMPLANT
TUBING CONNECTING 10 (TUBING) ×1 IMPLANT
WATER STERILE IRR 1000ML POUR (IV SOLUTION) ×6 IMPLANT
WATER STERILE IRR 500ML POUR (IV SOLUTION) ×2 IMPLANT

## 2021-12-18 NOTE — Anesthesia Preprocedure Evaluation (Addendum)
Anesthesia Evaluation  ?Patient identified by MRN, date of birth, ID band ?Patient awake ? ?General Assessment Comment: ? ?Patient presenting emergently for subdural hematoma with midline shift. Patient is actually alert and conscious, AOx3. She is vomiting profusely however.  ? ?Reviewed: ?Allergy & Precautions, NPO status , Patient's Chart, lab work & pertinent test results ? ?History of Anesthesia Complications ?Negative for: history of anesthetic complications ? ?Airway ?Mallampati: III ? ?TM Distance: >3 FB ?Neck ROM: Full ? ? ? Dental ?no notable dental hx. ?(+) Teeth Intact ?  ?Pulmonary ?sleep apnea , neg COPD, Patient abstained from smoking.Not current smoker,  ?  ?Pulmonary exam normal ?breath sounds clear to auscultation ? ? ? ? ? ? Cardiovascular ?Exercise Tolerance: Good ?METShypertension, +CHF  ?(-) CAD and (-) Past MI (-) dysrhythmias  ?Rhythm:Regular Rate:Normal ?- Systolic murmurs ?S/p AVR ? ?TTE 2021: ??1. Left ventricular ejection fraction, by visual estimation, is 60 to  ?65%. The left ventricle has normal function. There is no left ventricular  ?hypertrophy.  ??2. Left ventricular diastolic parameters are consistent with Grade I  ?diastolic dysfunction (impaired relaxation).  ??3. The left ventricle has no regional wall motion abnormalities.  ??4. Global right ventricle has normal systolic function.The right  ?ventricular size is normal. No increase in right ventricular wall  ?thickness.  ??5. Left atrial size was mildly dilated.  ??6. Tricuspid valve regurgitation is moderate.  ??7. Aortic valve not well visualized, bioprosthetic valve, mean gradient  ?measures 6.5 mmHg.  ??8. Aortic valve peak gradient measures 12.6 mmHg.  ??9. Mildly elevated pulmonary artery systolic pressure.  ?  ?Neuro/Psych ?PSYCHIATRIC DISORDERS Depression CVA   ? GI/Hepatic ?PUD, GERD  ,(+)  ?  ? (-) substance abuse ? ,   ?Endo/Other  ?neg diabetes ? Renal/GU ?negative Renal ROS  ? ?   ?Musculoskeletal ? ?(+) Arthritis ,  ? Abdominal ?  ?Peds ? Hematology ?  ?Anesthesia Other Findings ?Past Medical History: ?No date: Aortic stenosis ?No date: Bilateral cataracts ?No date: Bleeding nose ?    Comment:  Thurs night (09/08/15) and Fri (09/09/15) left  ?             side.Bleeding didn't last long ?1999: Breast cancer ([place]) ?    Comment:  right breast lumpectomy and rad tx ?No date: Chronic diastolic (congestive) heart failure ([place]) ?No date: Depression ?No date: Diverticulosis ?No date: Gastroesophageal reflux disease ?No date: Heart murmur ?No date: Hyperlipidemia ?No date: Hypertension ?No date: Iron deficiency anemia ?No date: Obstructive sleep apnea ?No date: Osteoarthritis ?No date: Osteopenia ?No date: Peptic ulcer disease ?No date: Personal history of radiation therapy ?09/16/2015: S/P aortic valve replacement with bioprosthetic valve ?    Comment:  23 mm Edwards Intuity Elite bioprosthetic tissue valve ?2020: Skin cancer ?No date: Stroke [facility]) ?    Comment:  11/14/13 ?No date: Supraventricular tachycardia ([place]) ?No date: Syncope and collapse ?No date: Urinary tract infection ? Reproductive/Obstetrics ? ?  ? ? ? ? ? ? ? ? ? ? ? ? ? ?  ?  ? ? ? ? ? ? ? ?Anesthesia Physical ?Anesthesia Plan ? ?ASA: 4 and emergent ? ?Anesthesia Plan: General  ? ?Post-op Pain Management: Ofirmev IV (intra-op)*  ? ?Induction: Intravenous and Rapid sequence ? ?PONV Risk Score and Plan: 3 and Ondansetron, Dexamethasone and Treatment may vary due to age or medical condition ? ?Airway Management Planned: Oral ETT and Video Laryngoscope Planned ? ?Additional Equipment: Arterial line ? ?Intra-op Plan:  ? ?Post-operative Plan: Extubation  in OR and Possible Post-op intubation/ventilation ? ?Informed Consent: I have reviewed the patients History and Physical, chart, labs and discussed the procedure including the risks, benefits and alternatives for the proposed anesthesia with the patient or authorized representative who has  indicated his/her understanding and acceptance.  ? ? ? ?Dental advisory given ? ?Plan Discussed with: CRNA and Surgeon ? ?Anesthesia Plan Comments: (Discussed risks of anesthesia with patient, including PONV, sore throat, lip/dental/eye damage. Rare risks discussed as well, such as cardiorespiratory and neurological sequelae, and allergic reactions. Discussed possibility of prolonged intubation but given her alertness preop, will make every effort to attempt extubation immediately postop. Discussed the role of CRNA in patient's perioperative care. Patient understands.)  ? ? ? ? ? ? ?Anesthesia Quick Evaluation ? ?

## 2021-12-18 NOTE — ED Triage Notes (Signed)
Pt reports that she was in her house and she was standing up and the next thing she knew she was on the ground.She does not remember falling. Pt has hematoma on the back of her. She does take '81mg'$  of ASA ?

## 2021-12-18 NOTE — ED Provider Triage Note (Signed)
Emergency Medicine Provider Triage Evaluation Note ? ?Kayla Gray , a 86 y.o. female  was evaluated in triage.  Pt complains of presents to the emergency department after a fall.  Patient cannot recall what caused her to fall.  She is complaining of occipital headache.  No chest pain, chest tightness or shortness of breath. ? ?Review of Systems  ?Positive: Patient has headache. ?Negative: No vomiting or abdominal pain. ? ?Physical Exam  ?There were no vitals taken for this visit. ?Gen:   Awake, no distress   ?Resp:  Normal effort  ?MSK:   Moves extremities without difficulty  ?Other:   ? ?Medical Decision Making  ?Medically screening exam initiated at 3:20 PM.  Appropriate orders placed.  Kayla Gray was informed that the remainder of the evaluation will be completed by another provider, this initial triage assessment does not replace that evaluation, and the importance of remaining in the ED until their evaluation is complete. ? ? ?  ?Vallarie Mare [place], PA-C ?12/18/21 1522 ? ?

## 2021-12-18 NOTE — ED Notes (Signed)
First Nurse Note:  Pt to ED via [facility] from home for Fall. Pt states that she was mopping in her kitchen and next thing she knew she woke up in her chair. Pt has hematoma to the back of the head. Bleeding is controlled at this time. VSS. Pt is in NAD. ?

## 2021-12-18 NOTE — ED Notes (Signed)
Report given to OR RN via phone. ?

## 2021-12-18 NOTE — Anesthesia Procedure Notes (Signed)
Arterial Line Insertion ?Performed by: Arita Miss, MD, anesthesiologist ? Patient location: OR. ?Preanesthetic checklist: patient identified, IV checked, site marked, risks and benefits discussed, surgical consent, monitors and equipment checked, pre-op evaluation, timeout performed and anesthesia consent ?Patient sedated ?Left, radial was placed ?Catheter size: 20 G ?Hand hygiene performed  and maximum sterile barriers used  ? ?Attempts: 2 ?Procedure performed using ultrasound guided technique. ?Ultrasound Notes:anatomy identified, needle tip was noted to be adjacent to the nerve/plexus identified and no ultrasound evidence of intravascular and/or intraneural injection ?Following insertion, dressing applied and Biopatch. ?Post procedure assessment: normal and unchanged ? ?Post procedure complications: local hematoma. ?Patient tolerated the procedure well with no immediate complications. ?Additional procedure comments: First attempt by Eliberto Ivory, CRNA using landmark/palpation technique. Second attempt successfully placed by Bertell Maria MD using ultrasound guidance.. ? ? ? ?

## 2021-12-18 NOTE — Consult Note (Addendum)
Referring Physician:  No referring provider defined for this encounter.  Primary Physician:  Derinda Late, MD  Chief Complaint:  intracranial hemorrhage  History of Present Illness: 12/18/2021 Kayla Gray is a 86 y.o. female who presents with the chief complaint of fall.  She does not remember the fall.  She has severe headache and has been vomiting.  She is otherwise asymptomatic as far as she can tell. She lives independently with her husband.    Review of Systems:  A 10 point review of systems is negative, except for the pertinent positives and negatives detailed in the HPI.  Past Medical History: Past Medical History:  Diagnosis Date   Aortic stenosis    Bilateral cataracts    Bleeding nose    Thurs night (09/08/15) and Fri (09/09/15) left side.Bleeding didn't last long   Breast cancer ([place]) 1999   right breast lumpectomy and rad tx   Chronic diastolic (congestive) heart failure (HCC)    Depression    Diverticulosis    Gastroesophageal reflux disease    Heart murmur    Hyperlipidemia    Hypertension    Iron deficiency anemia    Obstructive sleep apnea    Osteoarthritis    Osteopenia    Peptic ulcer disease    Personal history of radiation therapy    S/P aortic valve replacement with bioprosthetic valve 09/16/2015   23 mm Edwards Intuity Elite bioprosthetic tissue valve   Skin cancer 2020   Stroke ([place])    11/14/13   Supraventricular tachycardia (HCC)    Syncope and collapse    Urinary tract infection     Past Surgical History: Past Surgical History:  Procedure Laterality Date   AORTIC VALVE REPLACEMENT N/A 09/16/2015   Procedure: AORTIC VALVE REPLACEMENT (AVR);  Surgeon: Rexene Alberts, MD;  Location: [facility];  Service: Open Heart Surgery;  Laterality: N/A;   BREAST BIOPSY Right 1999   breast ca radation   BREAST EXCISIONAL BIOPSY Left yrs ago    benign   BREAST LUMPECTOMY Right 1999   with radiation   CARDIAC CATHETERIZATION N/A 08/17/2015    Procedure: Left Heart Cath and Coronary Angiography;  Surgeon: Minna Merritts, MD;  Location: [facility];  Service: Cardiovascular;  Laterality: N/A;   CATARACT EXTRACTION, BILATERAL     CHOLECYSTECTOMY     JOINT REPLACEMENT Bilateral    knee replacement   KNEE ARTHROSCOPY     right   KNEE ARTHROSCOPY     left   SKIN CANCER EXCISION  2020   TEE WITHOUT CARDIOVERSION N/A 09/16/2015   Procedure: TRANSESOPHAGEAL ECHOCARDIOGRAM (TEE);  Surgeon: Rexene Alberts, MD;  Location: [facility];  Service: Open Heart Surgery;  Laterality: N/A;   TONSILLECTOMY     VAGINAL HYSTERECTOMY      Allergies: Allergies as of 12/18/2021 - Review Complete 12/18/2021  Allergen Reaction Noted   Evista [raloxifene]  11/17/2012   Sulfa antibiotics Hives 11/17/2012   Codeine Hives 11/17/2012   Nitrofurantoin Rash 02/24/2013   Oxycodone-acetaminophen Rash 11/09/2014   Statins Other (See Comments) and Rash 11/18/2012    Medications:  Current Facility-Administered Medications:    nicardipine (CARDENE) '20mg'$  in 0.86% saline 258m IV infusion (0.1 mg/ml), 0-15 mg/hr, Intravenous, Continuous, IDuffy Bruce MD, Last Rate: 125 mL/hr at 12/18/21 1638, 12.5 mg/hr at 12/18/21 1638   Tdap (BOOSTRIX) injection 0.5 mL, 0.5 mL, Intramuscular, Once, IDuffy Bruce MD  Current Outpatient Medications:    acetaminophen (TYLENOL) 325 MG tablet, Take 650  mg by mouth every 6 (six) hours as needed., Disp: , Rfl:    amoxicillin (AMOXIL) 500 MG tablet, Take 4 tablets (2,000 mg total) by mouth as directed. Take 2 hours before surgery or dental work (Patient not taking: Reported on 11/06/2021), Disp: 4 tablet, Rfl: 1   aspirin EC 81 MG tablet, Take 1 tablet (81 mg total) by mouth daily., Disp: 90 tablet, Rfl: 3   Calcium Carbonate-Vitamin D (CALCIUM 600 + D PO), Take by mouth 2 (two) times daily., Disp: , Rfl:    ezetimibe (ZETIA) 10 MG tablet, Take 10 mg by mouth daily., Disp: , Rfl:    fluticasone (FLONASE) 50 MCG/ACT  nasal spray, Place 2 sprays into the nose daily as needed., Disp: , Rfl:    ketoconazole (NIZORAL) 2 % shampoo, Apply topically 3 (three) times a week., Disp: , Rfl:    metoprolol tartrate (LOPRESSOR) 25 MG tablet, TAKE 1 TABLET BY MOUTH TWICE A DAY, Disp: 180 tablet, Rfl: 1   Multiple Vitamin (MULTIVITAMIN) tablet, Take 1 tablet by mouth daily., Disp: , Rfl:    oxybutynin (DITROPAN-XL) 5 MG 24 hr tablet, Take 5 mg by mouth daily., Disp: , Rfl:    pantoprazole (PROTONIX) 40 MG tablet, Take 40 mg by mouth daily as needed.  (Patient not taking: Reported on 11/06/2021), Disp: , Rfl:    PARoxetine (PAXIL) 20 MG tablet, Take 20 mg by mouth daily., Disp: , Rfl:    Social History: Social History   Tobacco Use   Smoking status: Never   Smokeless tobacco: Never  Vaping Use   Vaping Use: Never used  Substance Use Topics   Alcohol use: Not Currently   Drug use: No    Family Medical History: Family History  Problem Relation Age of Onset   Heart failure Mother    Breast cancer Daughter 65   Breast cancer Maternal Aunt     Physical Examination: Vitals:   12/18/21 1620 12/18/21 1625  BP: (!) 170/90 (!) 185/94  Pulse: 78 92  Resp: 13 13  Temp:    SpO2: 97% 95%     General: Patient is well developed, well nourished, calm, collected, and in moderate apparent distress.  Psychiatric: Patient is non-anxious.  Head:  Pupils equal, round, and reactive to light.  ENT:  Oral mucosa appears well hydrated.  Neck:   Supple.  Full range of motion.  Respiratory: Patient is breathing without any difficulty.  Extremities: No edema.  Vascular: Palpable pulses in dorsal pedal vessels.  Skin:   On exposed skin, there are no abnormal skin lesions.  NEUROLOGICAL:  General: In moderate distress.  She vomited twice while I was examining her. Awake, alert, oriented to person, place, and time.  Pupils equal round and reactive to light.  Facial tone is symmetric.  Tongue protrusion is midline.   There is mild L pronator drift.   Strength: Side Biceps Triceps Deltoid Interossei Grip Wrist Ext. Wrist Flex.  R '5 5 5 5 5 5 5  '$ L 4+ 4+ 4+ '5 5 5 5   '$ Side Iliopsoas Quads Hamstring PF DF EHL  R '5 5 5 5 5 5  '$ L '5 5 5 5 5 5    '$ Bilateral upper and lower extremity sensation is intact to light touch. Reflexes are 1+ and symmetric at the biceps, triceps, brachioradialis, patella and achilles. Hoffman's is absent.  Clonus is not present.  Toes are down-going.    Gait untested.  Imaging: CT Head 12/18/2021 IMPRESSION: Holohemispheric mixed  density right subdural hematoma measuring up to 1.3 cm extending along the right hemispheric convexity, along the falx and right tentorium. Leftward midline shift measuring up to 1.0 cm with partial effacement of the right lateral ventricle.   Moderate-sized occipital scalp hematoma.   No acute cervical spine fracture.   Critical Value/emergent results were called by telephone at the time of interpretation on 12/18/2021 at 4:13 pm to provider Betha Loa PA, who verbally acknowledged these results.     Electronically Signed   By: Maurine Simmering M.D.   On: 12/18/2021 16:19  I have personally reviewed the images and agree with the above interpretation.  Labs: CBC Latest Ref Rng & Units 12/18/2021 08/30/2020 08/24/2020  WBC 4.0 - 10.5 K/uL 8.9 7.1 6.5  Hemoglobin 12.0 - 15.0 g/dL 11.8(L) 11.9(L) 11.0(L)  Hematocrit 36.0 - 46.0 % 37.5 36.4 34.0(L)  Platelets 150 - 400 K/uL 155 211 138(L)    Assessment and Plan: Ms. Carnegie is a pleasant 86 y.o. female with an acute subdural after an unwitnessed fall.  She does not remember the event, so likely syncopized.  She has current GCS of 15 but has nausea, vomiting, and a L pronator drift.  I have recommend surgical intervention to the patient and her husband.  I do not think observation will be successful as she already has substantial midline shift, nausea and vomiting as well as pronator drift.  The  subdural hematoma is causing brain compression.  I discussed the planned procedure at length with the patient, including the risks, benefits, alternatives, and indications. The risks discussed include but are not limited to bleeding, infection, need for reoperation, spinal fluid leak, stroke, vision loss, anesthetic complication, coma, paralysis, and even death. I also described in detail that improvement was not guaranteed.  The patient expressed understanding of these risks, and asked that we proceed with surgery. I described the surgery in layman's terms, and gave ample opportunity for questions, which were answered to the best of my ability.  We will proceed in emergency fashion.  The risks of waiting are too high to consider waiting for NPO status to clear.  Nina Mondor K. Izora Ribas MD, [facility] of Neurosurgery

## 2021-12-18 NOTE — Anesthesia Procedure Notes (Addendum)
Procedure Name: Intubation ?Date/Time: 12/18/2021 5:10 PM ?Performed by: Lily Peer, Karysa Heft, CRNA ?Pre-anesthesia Checklist: Patient identified, Emergency Drugs available, Suction available and Patient being monitored ?Patient Re-evaluated:Patient Re-evaluated prior to induction ?Oxygen Delivery Method: Circle system utilized ?Preoxygenation: Pre-oxygenation with 100% oxygen ?Induction Type: IV induction and Rapid sequence ?Laryngoscope Size: McGraph and 3 ?Grade View: Grade I ?Tube type: Oral ?Tube size: 7.0 mm ?Number of attempts: 1 ?Airway Equipment and Method: Stylet, Oral airway and Video-laryngoscopy ?Placement Confirmation: ETT inserted through vocal cords under direct vision, positive ETCO2 and breath sounds checked- equal and bilateral ?Secured at: 20 cm ?Tube secured with: Tape ?Dental Injury: Teeth and Oropharynx as per pre-operative assessment  ? ? ? ? ?

## 2021-12-18 NOTE — ED Notes (Signed)
COVID swab sent to lab.

## 2021-12-18 NOTE — Consult Note (Signed)
NAME:  Kayla Gray, MRN:  [id number], DOB:  Aug 28, 1936, LOS: 0 ADMISSION DATE:  12/18/2021, CONSULTATION DATE: 12/18/2021 REFERRING MD: Dr. Cari Caraway, CHIEF COMPLAINT: Fall   History of Present Illness:  This is an 86 yo female who presented to [facility] ER via EMS on 03/6 from home following an unwitnessed fall.  Per ER notes pt did not remember the fall, but woke up in her chair.  The last thing she remembered was mopping her kitchen floor.    ED Course Upon initial assessment in the ER pt had a hematoma on the back of her head along with nausea/vomiting, she does take 81 mg of aspirin daily.  CT Head revealed right subdural hematoma measuring up to 1.3 cm extending along the right hemispheric convexity, along the falx and right tentorium with leftward midline shift measuring up to 1.0 cm with partial effacement of the right lateral ventricle along with moderate-sized occipital scalp hematoma.  Neurosurgery consulted recommended emergent craniotomy.    Pertinent  Medical History  Aortic Stenosis  Bilateral Cataracts  Breast Cancer s/p Right Breast Lumpectomy/Radiation tx (8469) Chronic Diastolic CHF  Depression  Diverticulosis  GERD  Heart Murmur HLD HTN Iron Deficiency Anemia  OSA Osteopenia  Peptic Ulcer Disease S/p Aortic Valve Replacement with Bioprosthetic Valve (09/16/2015) Skin Cancer  Stroke SVT Syncope and Collapse   Significant Hospital Events: Including procedures, antibiotic start and stop dates in addition to other pertinent events   03/6: Pt admitted with right subdural hematoma with leftward midline shift following a fall admitted to ICU s/p craniotomy   Interim History / Subjective:  Patient admitted to the ICU postcraniotomy.  She is alert and complaining of head pain.  She has a drain that is putting out moderate amounts of serosanguineous fluid.  She is able to move all extremities and her speech is normal.  She denies chest pain, visual changes dizziness, and  headache.  Objective   Blood pressure (!) 185/94, pulse 92, temperature 98.2 F (36.8 C), temperature source Oral, resp. rate 13, height 5' 2.5" (1.588 m), weight 73.9 kg, SpO2 95 %.       No intake or output data in the 24 hours ending 12/18/21 1640 Filed Weights   12/18/21 1522  Weight: 73.9 kg    Examination: General: Well-nourished, appropriate for age, in no acute distress HENT: Pressure dressing in place over scalp, JP drain with serosanguineous fluid, PERRLA, extraocular eye movements intact Lungs: Lungs are clear to auscultation bilaterally Cardiovascular: Apical pulse regular, S1-S2, no murmur regurg or gallop, no edema Abdomen: Soft, nontender, normal bowel sounds Extremities: No cyanosis, no edema Neuro: Alert and oriented to person place and time, able to follow commands, moves all extremities, diminished grip strength in upper and lower extremities, speech is normal, visual fields intact GU: Foley in place draining clear urine  Resolved Hospital Problem list   None  Assessment & Plan:  Neurologic Subdural hematoma s/p traumatic fall: Patient is currently s/p craniotomy with evacuation of hematoma.   -Glasgow Coma Scale of 15 postoperatively.  Monitor and report any changes in Glasgow Coma Scale score -Continue pain management with as needed Tylenol and oxycodone. -Neurochecks per protocol -Keep head of the bed between 30 and 45 degrees -Strict fall precaution -PT OT eval -Monitor drainage and notify neurosurgeon if sanguinous -Blood pressure parameters per neurosurgery -Keppra and seizure precautions  Respiratory History of obstructive sleep apnea: No supplemental oxygen via nasal cannula  GI Patient had episodes of nausea and vomiting postoperatively.  It is unclear if this was related to her ICP or peptic ulcer disease. -Continue antiemetic -Start Protonix 40 mg daily  CV Hypertension: Currently on blood pressure parameters per neurosurgery - Give as  needed labetalol to maintain systolic blood pressure less than 160  12/18/2021 AT 2300. ADDENDUM Upon arrival in the ICU, patient had intractable nausea and vomiting.  Neurosurgery was notified and a stat CT head was obtained that was unremarkable.  Patient was given Phenergan and Zofran and morphine for pain and symptoms improved.  She is currently resting   Best Practice (right click and "Reselect all SmartList Selections" daily)   Diet/type: Regular consistency (see orders) DVT prophylaxis: SCD GI prophylaxis: PPI Lines: Arterial Line Foley:  N/A Code Status:  full code Last date of multidisciplinary goals of care discussion [PENDING]  Labs   CBC: Recent Labs  Lab 12/18/21 1533  WBC 8.9  NEUTROABS 6.8  HGB 11.8*  HCT 37.5  MCV 88.2  PLT 086    Basic Metabolic Panel: Recent Labs  Lab 12/18/21 1533  NA 138  K 3.8  CL 106  CO2 27  GLUCOSE 120*  BUN 18  CREATININE 0.91  CALCIUM 8.9   GFR: Estimated Creatinine Clearance: 44.6 mL/min (by C-G formula based on SCr of 0.91 mg/dL). Recent Labs  Lab 12/18/21 1533  WBC 8.9    Liver Function Tests: No results for input(s): AST, ALT, ALKPHOS, BILITOT, PROT, ALBUMIN in the last 168 hours. No results for input(s): LIPASE, AMYLASE in the last 168 hours. No results for input(s): AMMONIA in the last 168 hours.  ABG    Component Value Date/Time   PHART 7.369 09/17/2015 0400   PCO2ART 44.4 09/17/2015 0400   PO2ART 80.0 09/17/2015 0400   HCO3 25.4 (H) 09/17/2015 0400   TCO2 22 09/17/2015 1641   ACIDBASEDEF 5.0 (H) 09/16/2015 2344   O2SAT 95.0 09/17/2015 0400     Coagulation Profile: Recent Labs  Lab 12/18/21 1533  INR 1.1    Cardiac Enzymes: No results for input(s): CKTOTAL, CKMB, CKMBINDEX, TROPONINI in the last 168 hours.  HbA1C: Hgb A1c MFr Bld  Date/Time Value Ref Range Status  09/09/2015 12:38 PM 5.8 (H) 4.8 - 5.6 % Final    Comment:    (NOTE)         Pre-diabetes: 5.7 - 6.4         Diabetes:  >6.4         Glycemic control for adults with diabetes: <7.0     CBG: No results for input(s): GLUCAP in the last 168 hours.  Review of Systems:   All systems reviewed.  Pertinent positives include headache, scalp pain, and generalized weakness.  All other systems are negative  Past Medical History:  She,  has a past medical history of Aortic stenosis, Bilateral cataracts, Bleeding nose, Breast cancer ([place]) (1999), Chronic diastolic (congestive) heart failure ([place]), Depression, Diverticulosis, Gastroesophageal reflux disease, Heart murmur, Hyperlipidemia, Hypertension, Iron deficiency anemia, Obstructive sleep apnea, Osteoarthritis, Osteopenia, Peptic ulcer disease, Personal history of radiation therapy, S/P aortic valve replacement with bioprosthetic valve (09/16/2015), Skin cancer (2020), Stroke [facility]), Supraventricular tachycardia ([place]), Syncope and collapse, and Urinary tract infection.   Surgical History:   Past Surgical History:  Procedure Laterality Date   AORTIC VALVE REPLACEMENT N/A 09/16/2015   Procedure: AORTIC VALVE REPLACEMENT (AVR);  Surgeon: Rexene Alberts, MD;  Location: [facility];  Service: Open Heart Surgery;  Laterality: N/A;   BREAST BIOPSY Right 1999   breast ca radation  BREAST EXCISIONAL BIOPSY Left yrs ago    benign   BREAST LUMPECTOMY Right 1999   with radiation   CARDIAC CATHETERIZATION N/A 08/17/2015   Procedure: Left Heart Cath and Coronary Angiography;  Surgeon: Minna Merritts, MD;  Location: [facility];  Service: Cardiovascular;  Laterality: N/A;   CATARACT EXTRACTION, BILATERAL     CHOLECYSTECTOMY     JOINT REPLACEMENT Bilateral    knee replacement   KNEE ARTHROSCOPY     right   KNEE ARTHROSCOPY     left   SKIN CANCER EXCISION  2020   TEE WITHOUT CARDIOVERSION N/A 09/16/2015   Procedure: TRANSESOPHAGEAL ECHOCARDIOGRAM (TEE);  Surgeon: Rexene Alberts, MD;  Location: [facility];  Service: Open Heart Surgery;  Laterality: N/A;   TONSILLECTOMY      VAGINAL HYSTERECTOMY       Social History:   reports that she has never smoked. She has never used smokeless tobacco. She reports that she does not currently use alcohol. She reports that she does not use drugs.   Family History:  Her family history includes Breast cancer in her maternal aunt; Breast cancer (age of onset: 41) in her daughter; Heart failure in her mother.   Allergies Allergies  Allergen Reactions   Evista [Raloxifene]    Sulfa Antibiotics Hives   Codeine Hives   Nitrofurantoin Rash   Oxycodone-Acetaminophen Rash   Statins Other (See Comments) and Rash    Leg muscle cramps     Home Medications  Prior to Admission medications   Medication Sig Start Date End Date Taking? Authorizing Provider  acetaminophen (TYLENOL) 325 MG tablet Take 650 mg by mouth every 6 (six) hours as needed.    [provider]  amoxicillin (AMOXIL) 500 MG tablet Take 4 tablets (2,000 mg total) by mouth as directed. Take 2 hours before surgery or dental work Patient not taking: Reported on 11/06/2021 10/28/19   Minna Merritts, MD  aspirin EC 81 MG tablet Take 1 tablet (81 mg total) by mouth daily. 07/01/18   Minna Merritts, MD  Calcium Carbonate-Vitamin D (CALCIUM 600 + D PO) Take by mouth 2 (two) times daily.    [provider]  ezetimibe (ZETIA) 10 MG tablet Take 10 mg by mouth daily.    [provider]  fluticasone (FLONASE) 50 MCG/ACT nasal spray Place 2 sprays into the nose daily as needed.    [provider]  ketoconazole (NIZORAL) 2 % shampoo Apply topically 3 (three) times a week. 09/20/21   [provider]  metoprolol tartrate (LOPRESSOR) 25 MG tablet TAKE 1 TABLET BY MOUTH TWICE A DAY 12/15/21   Minna Merritts, MD  Multiple Vitamin (MULTIVITAMIN) tablet Take 1 tablet by mouth daily.    [provider]  oxybutynin (DITROPAN-XL) 5 MG 24 hr tablet Take 5 mg by mouth daily. 11/02/21   [provider]  pantoprazole (PROTONIX) 40  MG tablet Take 40 mg by mouth daily as needed.  Patient not taking: Reported on 11/06/2021 07/04/14   [provider]  PARoxetine (PAXIL) 20 MG tablet Take 20 mg by mouth daily.    [provider]     Critical care time: 65

## 2021-12-18 NOTE — Op Note (Signed)
Indications: The patient is a 86yo female who presented with a subdural hematoma.  Due to ongoing brain compression and symptoms, surgical intervention was recommended.  ? ?Findings: subdural hematoma ? ?Preoperative Diagnosis: subdural hematoma ?Postoperative Diagnosis: same ? ? ?EBL: 150 ml ?IVF: 900 ml ?Drains: none ?Disposition: Extubated  ?Complications: none ? ?A foley catheter was placed. ? ? ?Preoperative Note:  ? ?Risks of surgery discussed include: infection, bleeding, stroke, coma, death, paralysis, CSF leak, nerve/spinal cord injury, numbness, tingling, weakness, vascular injury, need for further surgery, persistent symptoms, and the risks of anesthesia. The patient and her spouse understood these risks and agreed to proceed. ? ?NAME OF PROCEDURE:               ?1. Right Craniotomy for evacuation of hematoma ? ? ?PROCEDURE:  Patient was brought to the operating room, intubated. The mayfield pins were applied.  The patient was then positioned for a right-sided frontotemporoparietal craniotomy.   ? ?The incision was planned, then prepped and draped in standard fashion.  The incision was opened sharply, then the galea opened.  Raney clips were placed.  The temporalis muscle was divided, then the periosteal used to reflect the muscle.  Fish hooks were used to retract the myocutaneous flap. A frontotemporoparietal craniotomy was then fashioned with the burr and craniotome. ? ?The dura was identified, then opened sharply.  A subdural hematoma was identified.  The acute subdural was then removed using irrigation and suction.  After removal, the intradural space was inspected and hemostasis achieved.   ? ?After hemostasis was achieved, we turned attention to closure.  The dura was approximated.  The craniotomy site was checked and a fixation plate used to reconstruct the skull.  A subgaleal drain was placed. The temporalis and galea were closed.  Staples were used on the skin. A sterile dressing was placed.    ? ?Needle, lap and all counts were correct at the end of the case.   ? ?Cooper Render PA assisted in the procedure.  ? ?Meade Maw MD ?Neurosurgery ?  ?

## 2021-12-18 NOTE — ED Notes (Signed)
Pt comes in from home after a fall at home. Pt did hit her head; hematoma to back of head noted. Pt is actively vomiting at this time. Pt A&O x4. Respirations unlabored & even. Pt does report HA. ?

## 2021-12-18 NOTE — ED Notes (Signed)
MD at bedside. 

## 2021-12-18 NOTE — ED Notes (Signed)
Pt transported to the OR. 

## 2021-12-18 NOTE — Progress Notes (Signed)
? ?   Attending Progress Note ? ?History: Kayla Gray is here for subdural hematoma. ? ?POD0: Sleeping but arousable.  No complaints ? ?Physical Exam: ?Vitals:  ? 12/18/21 2200 12/18/21 2300  ?BP: 117/69 136/67  ?Pulse: 79 70  ?Resp: 15 20  ?Temp:    ?SpO2: 100% 95%  ? ? ?[place] [facility] [place], hospital, 2023 ?CNI ?Not fully cooperative with drift but at least anti-gravity in all 4 limbs. ? ?Data: ? ?Recent Labs  ?Lab 12/18/21 ?1533  ?NA 138  ?K 3.8  ?CL 106  ?CO2 27  ?BUN 18  ?CREATININE 0.91  ?GLUCOSE 120*  ?CALCIUM 8.9  ? ?No results for input(s): AST, ALT, ALKPHOS in the last 168 hours. ? ?Invalid input(s): TBILI  ? Recent Labs  ?Lab 12/18/21 ?1533  ?WBC 8.9  ?HGB 11.8*  ?HCT 37.5  ?PLT 155  ? ?Recent Labs  ?Lab 12/18/21 ?1533  ?INR 1.1  ?  ?   ? ? ?Other tests/results: n/a ? ?Assessment/Plan: ? ?Kayla Gray is stable POD0 from R craniotomy for subdural hematoma ? ?- mobilize ?- pain control ?- CT in AM 12/19/21 ?- q2 hour neuro checks ?- elevate HOB ?- DVT prophylaxis tomorrow if AM head CT is stable ?- PTOT tomorrow ? ? ?Meade Maw MD, MPHS ?Department of Neurosurgery ? ? ? ?

## 2021-12-18 NOTE — Transfer of Care (Signed)
Immediate Anesthesia Transfer of Care Note ? ?Patient: Kayla Gray ? ?Procedure(s) Performed: CRANIOTOMY HEMATOMA EVACUATION SUBDURAL (Right: Head) ? ?Patient Location:  ICU ? ?Anesthesia Type:General ? ?Level of Consciousness: awake, oriented and patient cooperative ? ?Airway & Oxygen Therapy: Patient Spontanous Breathing and Patient connected to face mask oxygen ? ?Post-op Assessment: Report given to RN and Post -op Vital signs reviewed and stable ? ?Post vital signs: Reviewed and stable ? ?Last Vitals:  ?Vitals Value Taken Time  ?BP    ?Temp    ?Pulse    ?Resp    ?SpO2    ? ? ?Last Pain:  ?Vitals:  ? 12/18/21 1522  ?TempSrc:   ?PainSc: 10-Worst pain ever  ?   ? ?  ? ?Complications: No notable events documented. ?

## 2021-12-18 NOTE — ED Provider Notes (Signed)
? ?[facility] ?Provider Note ? ? ? Event Date/Time  ? First MD Initiated Contact with Patient 12/18/21 1600   ?  (approximate) ? ? ?History  ? ?Fall ? ? ?HPI ? ?Kayla Gray is a 86 y.o. female with past medical history of A-fib on aspirin only here with headache.  The patient states that she fell earlier today.  She does not necessarily know what made her fall, but she fell backwards, striking the back of her head.  Since then, she has had headache, aching pain, nausea, and no vomiting.  She has been slightly confused.  No history of previous injuries.  She states she was well prior to the fall and denies recent illness.  As mentioned, she does have history of atrial fibrillation but is on a baby aspirin only.  She did not take aspirin today.  No other recent changes in health. ?  ? ? ?Physical Exam  ? ?Triage Vital Signs: ?ED Triage Vitals  ?Enc Vitals Group  ?   BP 12/18/21 1521 (!) 173/88  ?   Pulse Rate 12/18/21 1521 67  ?   Resp 12/18/21 1521 18  ?   Temp 12/18/21 1521 98.2 ?F (36.8 ?C)  ?   Temp Source 12/18/21 1521 Oral  ?   SpO2 12/18/21 1521 96 %  ?   Weight 12/18/21 1522 163 lb (73.9 kg)  ?   Height 12/18/21 1522 5' 2.5" (1.588 m)  ?   Head Circumference --   ?   Peak Flow --   ?   Pain Score 12/18/21 1522 10  ?   Pain Loc --   ?   Pain Edu? --   ?   Excl. in [place]? --   ? ? ?Most recent vital signs: ?Vitals:  ? 12/18/21 1641 12/18/21 1645  ?BP: 132/66 127/67  ?Pulse:  99  ?Resp:  (!) 21  ?Temp:    ?SpO2:  99%  ? ? ? ?General: Awake, no distress.  ?CV:  Good peripheral perfusion.  Regular rate and rhythm. ?Resp:  Normal effort.  Lungs clear to auscultation bilaterally. ?Abd:  No distention.  No tenderness. ?Other:  Hematoma to the posterior parietal skull on the right.  No deformity.  No crepitance.  No periorbital or postauricular ecchymoses.  Vomiting but alert, oriented x4.  Strength out of 5 bilateral upper and lower extremities.  Normal sensation light touch.  No seizures.  No  tremors. ? ? ?ED Results / Procedures / Treatments  ? ?Labs ?(all labs ordered are listed, but only abnormal results are displayed) ?Labs Reviewed  ?BASIC METABOLIC PANEL - Abnormal; Notable for the following components:  ?    Result Value  ? Glucose, Bld 120 (*)   ? All other components within normal limits  ?CBC WITH DIFFERENTIAL/PLATELET - Abnormal; Notable for the following components:  ? Hemoglobin 11.8 (*)   ? All other components within normal limits  ?RESP PANEL BY RT-PCR (FLU A&B, COVID) [id number]  ?PROTIME-INR  ?TROPONIN I (HIGH SENSITIVITY)  ?TROPONIN I (HIGH SENSITIVITY)  ? ? ? ? ? ?RADIOLOGY ?CT head: Acute right subdural hematoma with approximately 1 cm midline shift ?CT C-spine: No acute cervical spine fracture ? ?I also independently reviewed and agree wit radiologist interpretations. ? ? ?PROCEDURES: ? ?Critical Care performed: Yes, see critical care procedure note(s) ? ?.Critical Care ?Performed by: Duffy Bruce, MD ?Authorized by: Duffy Bruce, MD  ? ?Critical care provider statement:  ?  Critical care time (  minutes):  30 ?  Critical care time was exclusive of:  Separately billable procedures and treating other patients ?  Critical care was necessary to treat or prevent imminent or life-threatening deterioration of the following conditions:  Cardiac failure, circulatory failure, respiratory failure and trauma ?  Critical care was time spent personally by me on the following activities:  Development of treatment plan with patient or surrogate, discussions with consultants, evaluation of patient's response to treatment, examination of patient, ordering and review of laboratory studies, ordering and review of radiographic studies, ordering and performing treatments and interventions, pulse oximetry, re-evaluation of patient's condition and review of old charts ?  I assumed direction of critical care for this patient from another provider in my specialty: no   ?  Care discussed with: admitting  provider   ? ? ? ?MEDICATIONS ORDERED IN ED: ?Medications  ?nicardipine (CARDENE) '20mg'$  in 0.86% saline 248m IV infusion (0.1 mg/ml) (0 mg/hr Intravenous Stopped 12/18/21 1709)  ?Tdap (BOOSTRIX) injection 0.5 mL ( Intramuscular MAR Hold 12/18/21 1701)  ?ondansetron (ZOFRAN) injection 4 mg (4 mg Intravenous Given 12/18/21 1609)  ? ? ? ?IMPRESSION / MDM / ASSESSMENT AND PLAN / ED COURSE  ?I reviewed the triage vital signs and the nursing notes. ?             ?               ?MDM:  ?86year old female with history of A-fib, hypertension, on aspirin only, here with fall and headache.  CT head obtained in triage, reviewed, and is concerning for acute subdural with significant midline shift.  Patient vomiting.  No other focal neurological deficits.  No seizure-like activity.  I immediately evaluated the patient once roomed in the ED and discussed with Dr. YCari Carawaywho will take the patient emergently to the operating room.  Patient is otherwise fairly healthy with good premorbid status, not on anticoagulation, and I confirmed this with her.  Cardene drip started for blood pressure control.  COVID sent.  Otherwise, no additional signs of trauma.  Denies any neck pain and has no signs to suggest occult cord injury. ? ?Per review of records including recent cardiology visits, patient is not currently on anticoagulation. ? ? ?MEDICATIONS GIVEN IN ED: ?Medications  ?nicardipine (CARDENE) '20mg'$  in 0.86% saline 2058mIV infusion (0.1 mg/ml) (0 mg/hr Intravenous Stopped 12/18/21 1709)  ?Tdap (BOOSTRIX) injection 0.5 mL ( Intramuscular MAR Hold 12/18/21 1701)  ?ondansetron (ZOFRAN) injection 4 mg (4 mg Intravenous Given 12/18/21 1609)  ? ? ? ?Consults:  ?Dr. YaCari Carawayeurosurgery consulted and will take the patient emergently to the operating room. ? ? ?EMR reviewed  ?Cardiology notes, including recent visit noting patient is not on anticoagulation. ?PCP note, with visit January 2023 with babaoff ? ? ? ? ?FINAL CLINICAL IMPRESSION(S) / ED  DIAGNOSES  ? ?Final diagnoses:  ?Subdural hematoma  ?Fall, initial encounter  ? ? ? ?Rx / DC Orders  ? ?ED Discharge Orders   ? ? None  ? ?  ? ? ? ?Note:  This document was prepared using Dragon voice recognition software and may include unintentional dictation errors. ?  ?IsDuffy BruceMD ?12/18/21 1710 ? ?

## 2021-12-18 NOTE — ED Notes (Signed)
Pt undressed & placed in a gown; yellow slippers placed on pt & pt given warm blankets. ?

## 2021-12-19 ENCOUNTER — Encounter: Payer: Self-pay | Admitting: Neurosurgery

## 2021-12-19 ENCOUNTER — Inpatient Hospital Stay: Payer: PPO

## 2021-12-19 DIAGNOSIS — S065XAA Traumatic subdural hemorrhage with loss of consciousness status unknown, initial encounter: Secondary | ICD-10-CM

## 2021-12-19 DIAGNOSIS — R569 Unspecified convulsions: Secondary | ICD-10-CM | POA: Diagnosis not present

## 2021-12-19 LAB — CBC
HCT: 33.3 % — ABNORMAL LOW (ref 36.0–46.0)
Hemoglobin: 10.7 g/dL — ABNORMAL LOW (ref 12.0–15.0)
MCH: 28.4 pg (ref 26.0–34.0)
MCHC: 32.1 g/dL (ref 30.0–36.0)
MCV: 88.3 fL (ref 80.0–100.0)
Platelets: 151 10*3/uL (ref 150–400)
RBC: 3.77 MIL/uL — ABNORMAL LOW (ref 3.87–5.11)
RDW: 14.4 % (ref 11.5–15.5)
WBC: 9.6 10*3/uL (ref 4.0–10.5)
nRBC: 0 % (ref 0.0–0.2)

## 2021-12-19 LAB — LIPID PANEL
Cholesterol: 161 mg/dL (ref 0–200)
HDL: 50 mg/dL (ref >40)
LDL Cholesterol: 93 mg/dL (ref 0–99)
Total CHOL/HDL Ratio: 3.2 RATIO
Triglycerides: 89 mg/dL (ref <150)
VLDL: 18 mg/dL (ref 0–40)

## 2021-12-19 LAB — GLUCOSE, CAPILLARY
Glucose-Capillary: 191 mg/dL — ABNORMAL HIGH (ref 70–99)
Glucose-Capillary: 119 mg/dL — ABNORMAL HIGH (ref 70–99)
Glucose-Capillary: 123 mg/dL — ABNORMAL HIGH (ref 70–99)
Glucose-Capillary: 120 mg/dL — ABNORMAL HIGH (ref 70–99)

## 2021-12-19 LAB — COMPREHENSIVE METABOLIC PANEL
ALT: 37 U/L (ref 0–44)
AST: 61 U/L — ABNORMAL HIGH (ref 15–41)
Albumin: 3.7 g/dL (ref 3.5–5.0)
Alkaline Phosphatase: 50 U/L (ref 38–126)
Anion gap: 12 (ref 5–15)
BUN: 15 mg/dL (ref 8–23)
CO2: 24 mmol/L (ref 22–32)
Calcium: 8.6 mg/dL — ABNORMAL LOW (ref 8.9–10.3)
Chloride: 103 mmol/L (ref 98–111)
Creatinine, Ser: 0.78 mg/dL (ref 0.44–1.00)
GFR, Estimated: >60 mL/min (ref >60)
Glucose, Bld: 240 mg/dL — ABNORMAL HIGH (ref 70–99)
Potassium: 3.8 mmol/L (ref 3.5–5.1)
Sodium: 139 mmol/L (ref 135–145)
Total Bilirubin: 0.5 mg/dL (ref 0.3–1.2)
Total Protein: 7.1 g/dL (ref 6.5–8.1)

## 2021-12-19 LAB — HEMOGLOBIN A1C
Hgb A1c MFr Bld: 5.7 % — ABNORMAL HIGH (ref 4.8–5.6)
Mean Plasma Glucose: 117 mg/dL

## 2021-12-19 LAB — MAGNESIUM: Magnesium: 1.9 mg/dL (ref 1.7–2.4)

## 2021-12-19 LAB — PHOSPHORUS: Phosphorus: 4 mg/dL (ref 2.5–4.6)

## 2021-12-19 MED ORDER — CHLORHEXIDINE GLUCONATE 0.12 % MT SOLN
15.0000 mL | Freq: Two times a day (BID) | OROMUCOSAL | Status: DC
  Administered 2021-12-19 – 2021-12-25 (×11): 15 mL via OROMUCOSAL
  Filled 2021-12-19 (×10): qty 15

## 2021-12-19 MED ORDER — LACTATED RINGERS IV SOLN: INTRAVENOUS | Status: DC

## 2021-12-19 MED ORDER — ORAL CARE MOUTH RINSE
15.0000 mL | Freq: Two times a day (BID) | OROMUCOSAL | Status: DC
  Administered 2021-12-19 – 2021-12-25 (×11): 15 mL via OROMUCOSAL

## 2021-12-19 MED ORDER — PANTOPRAZOLE SODIUM 40 MG IV SOLR
40.0000 mg | INTRAVENOUS | Status: DC
  Administered 2021-12-19 – 2021-12-22 (×4): 40 mg via INTRAVENOUS
  Filled 2021-12-19 (×4): qty 10

## 2021-12-19 MED ORDER — ENOXAPARIN SODIUM 40 MG/0.4ML IJ SOSY
40.0000 mg | PREFILLED_SYRINGE | INTRAMUSCULAR | Status: DC
  Administered 2021-12-19 – 2021-12-24 (×6): 40 mg via SUBCUTANEOUS
  Filled 2021-12-19 (×6): qty 0.4

## 2021-12-19 MED ORDER — INSULIN ASPART 100 UNIT/ML IJ SOLN
0.0000 [IU] | INTRAMUSCULAR | Status: DC
  Administered 2021-12-19 – 2021-12-20 (×3): 1 [IU] via SUBCUTANEOUS
  Filled 2021-12-19 (×3): qty 1

## 2021-12-19 NOTE — Progress Notes (Signed)
? ?   Attending Progress Note ? ?History: Kayla Gray is s/p right craniotomy for SDH evacuation  ? ?POD1: Complaints of headache overnight. Had CT head showing improved shift. Pt has been drowsy since 4 am.  ? ?Physical Exam: ?Vitals:  ? 12/19/21 0700 12/19/21 0800  ?BP: (!) 154/78 140/67  ?Pulse: 73 71  ?Resp: 15 13  ?Temp:  98.2 ?F (36.8 ?C)  ?SpO2: 99% 99%  ? ? ?Drowsy but arouses briefly to physical stimuli ?PERRL ?Squeezes hands and wiggles toes on command ?JP drain output 30 this am. ? ?Data: ? ?Recent Labs  ?Lab 12/18/21 ?1533 12/19/21 ?0706  ?NA 138 139  ?K 3.8 3.8  ?CL 106 103  ?CO2 27 24  ?BUN 18 15  ?CREATININE 0.91 0.78  ?GLUCOSE 120* 240*  ?CALCIUM 8.9 8.6*  ? ?Recent Labs  ?Lab 12/19/21 ?0706  ?AST 61*  ?ALT 37  ?ALKPHOS 50  ?  ? Recent Labs  ?Lab 12/18/21 ?1533 12/19/21 ?1950  ?WBC 8.9 9.6  ?HGB 11.8* 10.7*  ?HCT 37.5 33.3*  ?PLT 155 151  ? ?Recent Labs  ?Lab 12/18/21 ?1533  ?INR 1.1  ?  ?   ? ? ?Other tests/results:  ?CT head 12/19/21 ?IMPRESSION: ?1. Postoperative changes from interval right sided craniotomy for ?subdural evacuation. Residual small volume subdural hemorrhage along ?the falx and right tentorium measuring up to 6 mm in maximal ?thickness. Improved mass effect with trace 2 mm right-to-left shift. ?No hydrocephalus or trapping. ?2. No other new acute intracranial abnormality. ?  ?  ?Electronically Signed ?  By: Jeannine Boga M.D. ?  On: 12/19/2021 00:50 ? ?Assessment/Plan: ? ?Kayla Gray is a 86 y.o presenting after a fall with a right sided SDH with midline shift and left sided weakness s/p right craniotomy for evacuation ? ?- pt is more drowsy this morning. Ordered STAT head CT ?- will hold ppx lovenox for now ?- hold all sedative medications ?- if head CT stable and pt remains drowsy despite holding sedation, will consider EEG and MRI this afternoon for further evaluation. ?- please page neurosurgery with any questions or concerns ? ?Cooper Render PA-C ?Department of  Neurosurgery ? ?  ?

## 2021-12-19 NOTE — Plan of Care (Signed)
?  Problem: Clinical Measurements: ?Goal: Diagnostic test results will improve ?Outcome: Progressing ?Goal: Cardiovascular complication will be avoided ?Outcome: Progressing ?  ?Problem: Coping: ?Goal: Level of anxiety will decrease ?Outcome: Progressing ?  ?Problem: Pain Managment: ?Goal: General experience of comfort will improve ?Outcome: Progressing ?  ?Problem: Safety: ?Goal: Ability to remain free from injury will improve ?Outcome: Progressing ?  ?

## 2021-12-19 NOTE — Progress Notes (Signed)
Eeg done 

## 2021-12-19 NOTE — Progress Notes (Signed)
Inpatient Diabetes Program Recommendations ? ?AACE/ADA: New Consensus Statement on Inpatient Glycemic Control (2015) ? ?Target Ranges:  Prepandial:   less than 140 mg/dL ?     Peak postprandial:   less than 180 mg/dL (1-2 hours) ?     Critically ill patients:  140 - 180 mg/dL  ? ?Lab Results  ?Component Value Date  ? GLUCAP 191 (H) 12/19/2021  ? HGBA1C 5.8 (H) 09/09/2015  ? ? ?Review of Glycemic Control ? Latest Reference Range & Units 12/18/21 19:49 12/19/21 11:37  ?Glucose-Capillary 70 - 99 mg/dL 199 (H) 191 (H)  ?(H): Data is abnormally high ? ?Diabetes history: No DM history ? ? ?Inpatient Diabetes Program Recommendations:   ? ?Received Decadron 10 mg last evening.  A1C is pending.  Might consider: ? ?Novolog 0-9 units Q4H is NPO ? ?Will continue to follow while inpatient. ? ?Thank you, ?Reche Dixon, MSN, RN ?Diabetes Coordinator ?Inpatient Diabetes Program ?[phone] (team pager from 8a-5p) ? ? ? ?

## 2021-12-19 NOTE — Anesthesia Postprocedure Evaluation (Signed)
Anesthesia Post Note ? ?Patient: Kayla Gray ? ?Procedure(s) Performed: CRANIOTOMY HEMATOMA EVACUATION SUBDURAL (Right: Head) ? ?Patient location during evaluation: ICU ?Anesthesia Type: General ?Level of consciousness: awake ?Pain management: pain level controlled ?Vital Signs Assessment: post-procedure vital signs reviewed and stable ?Respiratory status: spontaneous breathing and respiratory function stable ?Cardiovascular status: blood pressure returned to baseline and stable ?Anesthetic complications: no ?Comments: Pt resting with eyes closed but easy to arouse. ? ? ?No notable events documented. ? ? ?Last Vitals:  ?Vitals:  ? 12/19/21 0700 12/19/21 0800  ?BP: (!) 154/78 140/67  ?Pulse: 73 71  ?Resp: 15 13  ?Temp:  36.8 ?C  ?SpO2: 99% 99%  ?  ?Last Pain:  ?Vitals:  ? 12/19/21 0800  ?TempSrc: Oral  ?PainSc: Asleep  ? ? ?  ?  ?  ?  ?  ?  ? ?Col[place]ta [place] Melah Ebling ? ? ? ? ?

## 2021-12-19 NOTE — Procedures (Signed)
Patient Name: Kayla Gray  ?MRN: [id number]  ?Epilepsy Attending: [name]  ?Referring Physician/Provider: Loleta Dicker, PA ?Date: 12/19/2021 ?Duration: 30.12 mins ? ?Patient history: 86 y.o presenting after a fall with a right sided SDH with midline shift and left sided weakness s/p right craniotomy for evacuation. EEG to evaluate for seizure ? ?Level of alertness: lethargic  ? ?AEDs during EEG study: LEV ? ?Technical aspects: This EEG study was done with scalp electrodes positioned according to the 10-20 International system of electrode placement. Electrical activity was acquired at a sampling rate of '500Hz'$  and reviewed with a high frequency filter of '70Hz'$  and a low frequency filter of '1Hz'$ . EEG data were recorded continuously and digitally stored.  ? ?Description: No clear posterior dominant rhythm was seen. EEG showed continuous generalized and lateralized right hemisphere 3 to 6 Hz theta-delta slowing admixed with overriding sharp transients in right frontal region consistent with breach artifact. Hyperventilation and photic stimulation were not performed.    ? ?ABNORMALITY ?- Continuous slow, generalized and lateralized right hemisphere ?- Breach artifact, right frontal region ? ?IMPRESSION: ?This study is suggestive of cortical dysfunction arising from right hemisphere, maximal right frontal region consistent with underlying craniotomy and SDH. Additionally there is moderate diffuse encephalopathy, nonspecific etiology. No seizures were seen throughout the recording. ? ?[name]  ? ?

## 2021-12-20 LAB — CBC WITH DIFFERENTIAL/PLATELET
Abs Immature Granulocytes: 0.03 10*3/uL (ref 0.00–0.07)
Basophils Absolute: 0 10*3/uL (ref 0.0–0.1)
Basophils Relative: 0 %
Eosinophils Absolute: 0 10*3/uL (ref 0.0–0.5)
Eosinophils Relative: 0 %
HCT: 30.5 % — ABNORMAL LOW (ref 36.0–46.0)
Hemoglobin: 9.5 g/dL — ABNORMAL LOW (ref 12.0–15.0)
Immature Granulocytes: 0 %
Lymphocytes Relative: 16 %
Lymphs Abs: 1.6 10*3/uL (ref 0.7–4.0)
MCH: 27.7 pg (ref 26.0–34.0)
MCHC: 31.1 g/dL (ref 30.0–36.0)
MCV: 88.9 fL (ref 80.0–100.0)
Monocytes Absolute: 0.8 10*3/uL (ref 0.1–1.0)
Monocytes Relative: 9 %
Neutro Abs: 7.2 10*3/uL (ref 1.7–7.7)
Neutrophils Relative %: 75 %
Platelets: 124 10*3/uL — ABNORMAL LOW (ref 150–400)
RBC: 3.43 MIL/uL — ABNORMAL LOW (ref 3.87–5.11)
RDW: 14.6 % (ref 11.5–15.5)
WBC: 9.7 10*3/uL (ref 4.0–10.5)
nRBC: 0 % (ref 0.0–0.2)

## 2021-12-20 LAB — BASIC METABOLIC PANEL
Anion gap: 7 (ref 5–15)
BUN: 24 mg/dL — ABNORMAL HIGH (ref 8–23)
CO2: 27 mmol/L (ref 22–32)
Calcium: 8.6 mg/dL — ABNORMAL LOW (ref 8.9–10.3)
Chloride: 106 mmol/L (ref 98–111)
Creatinine, Ser: 0.95 mg/dL (ref 0.44–1.00)
GFR, Estimated: 59 mL/min — ABNORMAL LOW (ref >60)
Glucose, Bld: 112 mg/dL — ABNORMAL HIGH (ref 70–99)
Potassium: 3.6 mmol/L (ref 3.5–5.1)
Sodium: 140 mmol/L (ref 135–145)

## 2021-12-20 LAB — GLUCOSE, CAPILLARY
Glucose-Capillary: 115 mg/dL — ABNORMAL HIGH (ref 70–99)
Glucose-Capillary: 99 mg/dL (ref 70–99)
Glucose-Capillary: 87 mg/dL (ref 70–99)
Glucose-Capillary: 132 mg/dL — ABNORMAL HIGH (ref 70–99)

## 2021-12-20 LAB — PHOSPHORUS: Phosphorus: 2.6 mg/dL (ref 2.5–4.6)

## 2021-12-20 LAB — MAGNESIUM: Magnesium: 2.1 mg/dL (ref 1.7–2.4)

## 2021-12-20 MED ORDER — LIDOCAINE HCL 1 % IJ SOLN
INTRAMUSCULAR | Status: AC
  Administered 2021-12-20: 5 mL
  Filled 2021-12-20: qty 10

## 2021-12-20 MED ORDER — LIDOCAINE HCL (PF) 1 % IJ SOLN: 5.0000 mL | Freq: Once | INTRAMUSCULAR | Status: DC

## 2021-12-20 MED ORDER — LIDOCAINE HCL 1 % IJ SOLN: 5.0000 mL | Freq: Once | INTRAMUSCULAR | Status: AC

## 2021-12-20 NOTE — Evaluation (Signed)
Occupational Therapy Evaluation Patient Details Name: Kayla Gray MRN: [id number] DOB: [birth year] Today's Date: 12/20/2021   History of Present Illness Pt is a 86 y.o. female who presents to the ED after falling and Pt report of "striking the back of her head". Admitted for Subdural hematoma s/p R craniotomy for evacuation of hematoma (3/6). PmHx: A-Fib, History of Cancer, CHF, Depression, HTN, HLD, OA, GERD.   Clinical Impression   Pt seen for OT evaluation this date. Upon arrival to room, pt asleep in bed, however easily awoken. Pt's supportive husband present throughout session. Pt oriented to self, place, and year, and reporting 5/10 headache pain however agreeable to OT eval/tx. Prior to admission, pt was independent in all ADLs, living in a 1-story home with husband. Pt currently presents with decreased awareness of deficits, decreased balance, decreased strength, and decreased activity tolerance. Pt endorsed that her vision is different than usual, however was unable to elaborate and had difficulty following cues during formal vision assessment; plan to assess in subsequent sessions. Due to current functional impairments, pt requires MIN GUARD for supine>sit transfer (with HOB elevated), MIN A for sit>stand transfers, MOD A for transfer to/from [facility], and SUPERVISION/SET-UP for bringing cup to mouth to drink. Pt would benefit from additional skilled OT services to maximize return to PLOF and minimize risk of future falls, injury, caregiver burden, and readmission. Upon discharge, recommend acute inpatient rehab.    Recommendations for follow up therapy are one component of a multi-disciplinary discharge planning process, led by the attending physician.  Recommendations may be updated based on patient status, additional functional criteria and insurance authorization.   Follow Up Recommendations  Acute inpatient rehab (3hours/day)    Assistance Recommended at Discharge Frequent or constant  Supervision/Assistance  Patient can return home with the following A lot of help with walking and/or transfers;A lot of help with bathing/dressing/bathroom;Assistance with cooking/housework;Help with stairs or ramp for entrance    Functional Status Assessment  Patient has had a recent decline in their functional status and demonstrates the ability to make significant improvements in function in a reasonable and predictable amount of time.  Equipment Recommendations  Other (comment) (defer to next venue of care)       Precautions / Restrictions Precautions Precautions: Fall Restrictions Weight Bearing Restrictions: No      Mobility Bed Mobility Overal bed mobility: Needs Assistance Bed Mobility: Supine to Sit     Supine to sit: Min guard, HOB elevated          Transfers Overall transfer level: Needs assistance Equipment used: Rolling walker (2 wheels) Transfers: Sit to/from Stand Sit to Stand: Min assist                  Balance Overall balance assessment: Needs assistance Sitting-balance support: Bilateral upper extremity supported, Feet supported Sitting balance-Leahy Scale: Fair Sitting balance - Comments: Requires only supervision for reaching within BOS at EOB   Standing balance support: Bilateral upper extremity supported, During functional activity, Reliant on assistive device for balance Standing balance-Leahy Scale: Poor Standing balance comment: Requires MIN-MOD A for steadying during stand pivot transfers                           ADL either performed or assessed with clinical judgement   ADL Overall ADL's : Needs assistance/impaired Eating/Feeding: Supervision/ safety;Set up;Sitting Eating/Feeding Details (indicate cue type and reason): able to bring cup to mouth and drink via straw  Toilet Transfer: Moderate assistance;Stand-pivot;BSC/3in1;Rolling walker (2 wheels)                   Vision    Additional Comments: Pt reported vision different than usual, however unable to elaborate and had difficulty following cues during formal vision assessment. Plan to assess in subsequent sessions            Pertinent Vitals/Pain Pain Assessment Pain Assessment: 0-10 Pain Score: 5  Pain Location: head Pain Descriptors / Indicators: Headache Pain Intervention(s): Limited activity within patient's tolerance, Repositioned        Extremity/Trunk Assessment Upper Extremity Assessment Upper Extremity Assessment: Generalized weakness (grossly at least 3/5 in all movements. Sensation symmetrical) RUE Sensation: WNL   Lower Extremity Assessment Lower Extremity Assessment: Generalized weakness       Communication Communication Communication: No difficulties   Cognition Arousal/Alertness: Lethargic, Suspect due to medications Behavior During Therapy: Flat affect Overall Cognitive Status: Impaired/Different from baseline                 [place] Levels of Cognitive Functioning [place] [employer] Scales of Cognitive Functioning: Automatic/appropriate               General Comments: Pt oriented to self, place, and year only. Lethargic, however alertness improving during functional transfers [place] of Cognitive Functioning: Automatic/appropriate              Home Living Family/patient expects to be discharged to:: Private residence Living Arrangements: Spouse/significant other Available Help at Discharge: Family Type of Home: House Home Access: Stairs to enter Technical brewer of Steps: 4 Entrance Stairs-Rails: Right Home Layout: Able to live on main level with bedroom/bathroom     Bathroom Shower/Tub: Teacher, early years/pre: Standard     Home Equipment: Grab bars - tub/shower;Toilet riser;Cane - single Barista (2 wheels);BSC/3in1   Additional Comments: Per past chart history: Pt utilizes SPC in community and no AD  at home      Prior Functioning/Environment Prior Level of Function : Independent/Modified Independent               ADLs Comments: Pt and pt's husband report she was independent with all ADLs at baseline        OT Problem List: Decreased strength;Decreased activity tolerance;Impaired balance (sitting and/or standing);Decreased cognition;Decreased safety awareness;Pain      OT Treatment/Interventions: Self-care/ADL training;Therapeutic exercise;Neuromuscular education;Energy conservation;DME and/or AE instruction;Therapeutic activities;Cognitive remediation/compensation;Patient/family education;Balance training;Visual/perceptual remediation/compensation    OT Goals(Current goals can be found in the care plan section) Acute Rehab OT Goals Patient Stated Goal: to regain independence OT Goal Formulation: With family Time For Goal Achievement: 01/03/22 Potential to Achieve Goals: Good ADL Goals Pt Will Perform Grooming: with supervision;sitting Pt Will Transfer to Toilet: with min guard assist;stand pivot transfer;bedside commode Pt Will Perform Toileting - Clothing Manipulation and hygiene: with min assist;sitting/lateral leans  OT Frequency: Min 4X/week       AM-PAC OT "6 Clicks" Daily Activity     Outcome Measure Help from another person eating meals?: A Little Help from another person taking care of personal grooming?: A Little Help from another person toileting, which includes using toliet, bedpan, or urinal?: A Lot Help from another person bathing (including washing, rinsing, drying)?: A Lot Help from another person to put on and taking off regular upper body clothing?: A Little Help from another person to put on and taking off regular lower body clothing?: A Lot 6 Click Score: 15  End of Session Equipment Utilized During Treatment: Rolling walker (2 wheels);Oxygen Nurse Communication: Mobility status  Activity Tolerance: Patient tolerated treatment well Patient  left: in chair;with call bell/phone within reach  OT Visit Diagnosis: Unsteadiness on feet (R26.81);Other symptoms and signs involving the nervous system (R29.898);History of falling (Z91.81)                Time: 6283-1517 OT Time Calculation (min): 25 min Charges:  OT General Charges $OT Visit: 1 Visit OT Evaluation $OT Eval Moderate Complexity: 1 Mod OT Treatments $Self Care/Home Management : 8-22 mins  Fredirick Maudlin, OTR/L [place]

## 2021-12-20 NOTE — Progress Notes (Signed)
Inpatient Rehab Admissions Coordinator Note:  ? ?Per PT recommendations patient was screened for CIR candidacy by Michel Santee, PT, DPT. At this time, pt appears to be a potential candidate for CIR. I will place an order for rehab consult for full assessment, per our protocol.  Please contact me any with questions. ? ?Shann Medal, PT, DPT ?[phone] ?12/20/21 ?2:31 PM  ?

## 2021-12-20 NOTE — Evaluation (Addendum)
Physical Therapy Evaluation ?Patient Details ?Name: Kayla Gray ?MRN: [id number] ?DOB: [birth year] ?Today's Date: 12/20/2021 ? ?History of Present Illness ? Pt is a 86 y.o. female who presents to the ED after falling and Pt report of "striking the back of her head". Admitted for Subdural hematoma s/p R craniotomy for evacuation of hematoma (3/6). PmHx: A-Fib, History of Cancer, CHF, Depression, HTN, HLD, OA, GERD. ?  ?Clinical Impression ? Pt resting in bed upon PT entrance into room for evaluation today. Pt is A&Ox3; she is able to recall she fell but does not remember why she is currently in the hospital. Prior to hospitalization she was independent w/ ADLs and lives with her husband in a 1-story house w/ 4 STE. Per chart history she used a [facility] for community ambulation, but no AD at home. ? ?Pt overall sensation and strength were [facility] for session; however, required increased time for L UE tactile distinguishing b/w R and L. She was able to perform all bed mobility w/ minA; not necessary, but required for trunk elevation for patient safety due to craniotomy. Once seated EOB, she was able to maintain static sitting balance for ~40mn. Sit to stand was performed w/ minA using RW and verbal cues were required for proper UE placement for optimal utilization and safety. Pt will benefit from continued skilled PT in order to increase LE strength, improve mobility/gait/balance, and restore PLOF. Current discharge recommendation to inpatient rehab to return patient to PLOF to maximize pt outcomes. Pt reported family support at home. ? ?   ? ?Recommendations for follow up therapy are one component of a multi-disciplinary discharge planning process, led by the attending physician.  Recommendations may be updated based on patient status, additional functional criteria and insurance authorization. ? ?Follow Up Recommendations Acute inpatient rehab (3hours/day) ? ?  ?Assistance Recommended at Discharge Frequent or constant  Supervision/Assistance  ?Patient can return home with the following ? A lot of help with walking and/or transfers;A lot of help with bathing/dressing/bathroom;Assistance with cooking/housework;Assist for transportation;Help with stairs or ramp for entrance ? ?  ?Equipment Recommendations None recommended by PT  ?Recommendations for Other Services ?    ?  ?Functional Status Assessment Patient has had a recent decline in their functional status and demonstrates the ability to make significant improvements in function in a reasonable and predictable amount of time.  ? ?  ?Precautions / Restrictions Precautions ?Precautions: Fall ?Restrictions ?Weight Bearing Restrictions: No  ? ?  ? ?Mobility ? Bed Mobility ?Overal bed mobility: Needs Assistance ?Bed Mobility: Supine to Sit, Sit to Supine ?  ?  ?Supine to sit: Min assist ?Sit to supine: Min assist ?  ?General bed mobility comments: increased time for safety ?  ? ?Transfers ?Overall transfer level: Needs assistance ?Equipment used: Rolling walker (2 wheels) ?Transfers: Sit to/from Stand ?Sit to Stand: Min assist ?  ?  ?  ?  ?  ?  ?  ? ?Ambulation/Gait ?  ?  ?  ?  ?  ?  ?  ?  ? ?Stairs ?  ?  ?  ?  ?  ? ?Wheelchair Mobility ?  ? ?Modified Rankin (Stroke Patients Only) ?  ? ?  ? ?Balance Overall balance assessment: Needs assistance ?Sitting-balance support: Bilateral upper extremity supported, Feet supported ?Sitting balance-Leahy Scale: Good ?  ?  ?Standing balance support: Bilateral upper extremity supported, During functional activity, Reliant on assistive device for balance ?Standing balance-Leahy Scale: Fair ?  ?  ?  ?  ?  ?  ?  ?  ?  ?  ?  ?  ?   ? ? ? ?  Pertinent Vitals/Pain Pain Assessment ?Pain Assessment: No/denies pain  ? ? ?Home Living Family/patient expects to be discharged to:: Private residence ?Living Arrangements: Spouse/significant other ?Available Help at Discharge: Family ?Type of Home: House ?Home Access: Stairs to enter ?Entrance Stairs-Rails:  Right ?Entrance Stairs-Number of Steps: 4 ?  ?Home Layout: Able to live on main level with bedroom/bathroom ?Home Equipment: Grab bars - tub/shower;Toilet riser;Cane - single point;Rolling Walker (2 wheels) ?Additional Comments: Per past chart history: Pt utilizes SPC in community and no AD at home  ?  ?Prior Function Prior Level of Function : Independent/Modified Independent ?  ?  ?  ?  ?  ?  ?  ?  ?  ? ? ?Hand Dominance  ?   ? ?  ?Extremity/Trunk Assessment  ? Upper Extremity Assessment ?Upper Extremity Assessment: Overall WFL for tasks assessed;LUE deficits/detail;RUE deficits/detail ?RUE Sensation:  (increased time required to distinguish tactile touch for L > R) ?RUE Coordination: decreased fine motor ?LUE Coordination: decreased fine motor ?  ? ?Lower Extremity Assessment ?Lower Extremity Assessment: Overall WFL for tasks assessed;Generalized weakness ?  ? ?   ?Communication  ?    ?Cognition Arousal/Alertness: Awake/alert ?Behavior During Therapy: WFL for tasks assessed/performed, Anxious (anxious about movement, but overall tolerated well) ?Overall Cognitive Status: Within Functional Limits for tasks assessed ?Area of Impairment: Orientation ?  ?  ?  ?  ?  ?  ?  ?Rancho Levels of Cognitive Functioning ?Rancho [employer] Scales of Cognitive Functioning: Automatic/appropriate ?Orientation Level: Person, Place, Time, Situation ?  ?  ?  ?  ?  ?  ?General Comments: Recalled she was in hospital; thought it was [facility] but self-corrected to [facility]. ?  ?Rancho [employer] Scales of Cognitive Functioning: Automatic/appropriate ? ?  ?General Comments   ? ?  ?Exercises    ? ?Assessment/Plan  ?  ?PT Assessment Patient needs continued PT services  ?PT Problem List Decreased strength;Decreased mobility;Decreased coordination;Decreased activity tolerance;Decreased balance;Decreased safety awareness ? ?   ?  ?PT Treatment Interventions DME instruction;Therapeutic exercise;Gait training;Balance training;Stair training;Neuromuscular  re-education;Functional mobility training;Therapeutic activities;Patient/family education   ? ?PT Goals (Current goals can be found in the Care Plan section)  ?Acute Rehab PT Goals ?Patient Stated Goal: to improve symptoms to go home ?PT Goal Formulation: With patient ?Time For Goal Achievement: 01/03/22 ?Potential to Achieve Goals: Fair ? ?  ?Frequency 7X/week ?  ? ? ?Co-evaluation   ?  ?  ?  ?  ? ? ?  ?AM-PAC PT "6 Clicks" Mobility  ?Outcome Measure Help needed turning from your back to your side while in a flat bed without using bedrails?: A Little ?Help needed moving from lying on your back to sitting on the side of a flat bed without using bedrails?: A Little ?Help needed moving to and from a bed to a chair (including a wheelchair)?: A Little ?Help needed standing up from a chair using your arms (e.g., wheelchair or bedside chair)?: A Little ?Help needed to walk in hospital room?: A Lot ?Help needed climbing 3-5 steps with a railing? : A Lot ?6 Click Score: 16 ? ?  ?End of Session Equipment Utilized During Treatment: Gait belt ?Activity Tolerance: Patient tolerated treatment well;Patient limited by fatigue ?Patient left: in bed;with call bell/phone within reach;with bed alarm set ?Nurse Communication: Mobility status ?PT Visit Diagnosis: Unsteadiness on feet (R26.81);History of falling (Z91.81);Muscle weakness (generalized) (M62.81) ?  ? ?Time: 8099-8338 ?PT Time Calculation (min) (ACUTE ONLY): 33 min ? ? ?Charges:     ?  ?  ?   ? ?  Jonnie Kind, SPT ?12/20/2021, 11:05 AM ? ?

## 2021-12-20 NOTE — Progress Notes (Signed)
? ? Attending Progress Note ? ?History: Kayla Gray is s/p right craniotomy for SDH evacuation  ? ?POD2: AMS yesterday. Head CT, MRI, and EEG without explanation. Improved with holding narcotics. Pt without complaints this morning ? ?POD1: Complaints of headache overnight. Had CT head showing improved shift. Pt has been drowsy since 4 am.  ? ?Physical Exam: ?Vitals:  ? 12/20/21 0500 12/20/21 0600  ?BP: 127/68   ?Pulse: 75 90  ?Resp: 17 14  ?Temp:    ?SpO2: 100% 100%  ? ? ?Drowsy but arouses briefly to physical stimuli ?Oriented to person and year. Responds August when asked the month ?PERRL ?No pronator drift.  ?Follows commands x 4. ?JP drain output 110 overnight ? ?Data: ? ?Recent Labs  ?Lab 12/18/21 ?1533 12/19/21 ?0706  ?NA 138 139  ?K 3.8 3.8  ?CL 106 103  ?CO2 27 24  ?BUN 18 15  ?CREATININE 0.91 0.78  ?GLUCOSE 120* 240*  ?CALCIUM 8.9 8.6*  ? ? ?Recent Labs  ?Lab 12/19/21 ?0706  ?AST 61*  ?ALT 37  ?ALKPHOS 50  ? ?  ? Recent Labs  ?Lab 12/18/21 ?1533 12/19/21 ?2703  ?WBC 8.9 9.6  ?HGB 11.8* 10.7*  ?HCT 37.5 33.3*  ?PLT 155 151  ? ? ?Recent Labs  ?Lab 12/18/21 ?1533  ?INR 1.1  ? ?  ?   ? ? ?Other tests/results:  ?CT head 12/19/21 ?IMPRESSION: ?1. Postoperative changes from interval right sided craniotomy for ?subdural evacuation. Residual small volume subdural hemorrhage along ?the falx and right tentorium measuring up to 6 mm in maximal ?thickness. Improved mass effect with trace 2 mm right-to-left shift. ?No hydrocephalus or trapping. ?2. No other new acute intracranial abnormality. ?Electronically Signed ?  By: Jeannine Boga M.D. ?  On: 12/19/2021 00:50 ? ?MRI brain 12/19/21 ?IMPRESSION: ?1. Small region of acute ischemia in the right superior frontal ?gyrus near the vertex corresponding to the finding on the same-day ?head CT, and punctate acute infarct in the right insular cortex. ?2. Stable postsurgical changes reflecting right frontoparietal ?craniotomy for subdural hematoma evacuation. Residual  subdural blood ?over the right cerebral convexity, layering along the falx ?posteriorly, and right tentorial leaflet is overall similar to the ?same-day head CT, with minimal mass effect and no measurable midline ?shift. ?3. Subdural collections are also seen overlying the bilateral ?cerebellar hemispheres extending to the craniocervical junction. ?Please also see the separately dictated cervical spine MRI. ?  ?Electronically Signed ?  By: Valetta Mole M.D. ?  On: 12/19/2021 15:08 ? ?MRI C-spine 12/19/21 ?IMPRESSION: ?Extramedullary hemorrhage within the upper cervical spinal canal ?(extending from the craniocervical junction to the C5 level). ?Resultant mild-to-moderate narrowing of the upper cervical spinal ?canal (without spinal cord mass effect). ?  ?Cervical spondylosis, as outlined and having progressed at multiple ?levels since the prior MRI of 05/01/2017. ?  ?At C6-C7, there is progressive moderate/advanced disc degeneration ?with mild degenerative endplate edema. A posterior disc osteophyte ?complex contributes to progressive moderate spinal canal stenosis. ?Multifactorial severe bilateral neural foraminal narrowing also ?present at this level. ?  ?No more than mild degenerative spinal canal narrowing at the ?remaining levels. ?  ?Multifactorial moderate left neural foraminal narrowing at C5-C6. ?  ?  ?Electronically Signed ?  By: [name] Simmering D.O. ?  On: 12/19/2021 15:23 ? ?EEG 12/19/21 ?IMPRESSION: ?This study is suggestive of cortical dysfunction arising from right hemisphere, maximal right frontal region consistent with underlying craniotomy and SDH. Additionally there is moderate diffuse encephalopathy, nonspecific etiology. No seizures  were seen throughout the recording. ?  ?[name]  ? ?Assessment/Plan: ? ?Kayla Gray is a 86 y.o presenting after a fall with a right sided SDH with midline shift and left sided weakness s/p right craniotomy for evacuation ? ?- imaging yesterday was reassuring.   ?- Started ppx Lovenox  ?- hold all sedative medications ?- Will remove JP drain this afternoon ?- will transfer to floor.  ?- please page neurosurgery with any questions or concerns ? ?Cooper Render PA-C ?Department of Neurosurgery ? ?  ?
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
## 2021-12-20 NOTE — Progress Notes (Signed)
NAME:  Kayla Gray, MRN:  [id number], DOB:  July 27, 1936, LOS: 2 ADMISSION DATE:  12/18/2021, CONSULTATION DATE: 12/18/2021 REFERRING MD: Dr. Cari Caraway, CHIEF COMPLAINT: Fall   History of Present Illness:  This is an 86 yo female who presented to [facility] ER via EMS on 03/6 from home following an unwitnessed fall.  Per ER notes pt did not remember the fall, but woke up in her chair.  The last thing she remembered was mopping her kitchen floor.    ED Course Upon initial assessment in the ER pt had a hematoma on the back of her head along with nausea/vomiting, she does take 81 mg of aspirin daily.  CT Head revealed right subdural hematoma measuring up to 1.3 cm extending along the right hemispheric convexity, along the falx and right tentorium with leftward midline shift measuring up to 1.0 cm with partial effacement of the right lateral ventricle along with moderate-sized occipital scalp hematoma.  Neurosurgery consulted recommended emergent craniotomy.    -12/20/21 - patient is stable and being optimized for TRH transfer. Discussed care plan with Dr Cari Caraway  Pertinent  Medical History  Aortic Stenosis  Bilateral Cataracts  Breast Cancer s/p Right Breast Lumpectomy/Radiation tx (6546) Chronic Diastolic CHF  Depression  Diverticulosis  GERD  Heart Murmur HLD HTN Iron Deficiency Anemia  OSA Osteopenia  Peptic Ulcer Disease S/p Aortic Valve Replacement with Bioprosthetic Valve (09/16/2015) Skin Cancer  Stroke SVT Syncope and Collapse   Significant Hospital Events: Including procedures, antibiotic start and stop dates in addition to other pertinent events   03/6: Pt admitted with right subdural hematoma with leftward midline shift following a fall admitted to ICU s/p craniotomy   Interim History / Subjective:  Patient admitted to the ICU postcraniotomy.  She is alert and complaining of head pain.  She has a drain that is putting out moderate amounts of serosanguineous fluid.  She is  able to move all extremities and her speech is normal.  She denies chest pain, visual changes dizziness, and headache.  Objective   Blood pressure 137/77, pulse 79, temperature 98.2 F (36.8 C), temperature source Oral, resp. rate 13, height '5\' 2"'$  (1.575 m), weight 76.7 kg, SpO2 100 %.    FiO2 (%):  [36 %] 36 %   Intake/Output Summary (Last 24 hours) at 12/20/2021 1059 Last data filed at 12/20/2021 1000 Gross per 24 hour  Intake 1028.46 ml  Output 820 ml  Net 208.46 ml   Filed Weights   12/18/21 1522 12/18/21 1945  Weight: 73.9 kg 76.7 kg    Examination: General: Well-nourished, appropriate for age, in no acute distress HENT: Pressure dressing in place over scalp, JP drain with serosanguineous fluid, PERRLA, extraocular eye movements intact Lungs: Lungs are clear to auscultation bilaterally Cardiovascular: Apical pulse regular, S1-S2, no murmur regurg or gallop, no edema Abdomen: Soft, nontender, normal bowel sounds Extremities: No cyanosis, no edema Neuro: Alert and oriented to person place and time, able to follow commands, moves all extremities, diminished grip strength in upper and lower extremities, speech is normal, visual fields intact GU: Foley in place draining clear urine  Resolved Hospital Problem list   None  Assessment & Plan:  Neurologic Subdural hematoma s/p traumatic fall: Patient is currently s/p craniotomy with evacuation of hematoma.   -Glasgow Coma Scale of 15 postoperatively.  Monitor and report any changes in Glasgow Coma Scale score -Continue pain management with as needed Tylenol and oxycodone. -Neurochecks per protocol -Keep head of the bed between 30 and  45 degrees -Strict fall precaution -PT OT eval -Monitor drainage and notify neurosurgeon if sanguinous -Blood pressure parameters per neurosurgery -Keppra and seizure precautions - s/p neurosurgery evaluation - restarted lovenox , recommendation for dc sedation patient remains on Keppra.    Respiratory History of obstructive sleep apnea: No supplemental oxygen via nasal cannula  GI Patient had episodes of nausea and vomiting postoperatively.  It is unclear if this was related to her ICP or peptic ulcer disease. -Continue antiemetic -Start Protonix 40 mg daily  CV Hypertension: Currently on blood pressure parameters per neurosurgery - Give as needed labetalol to maintain systolic blood pressure less than 160  12/18/2021 AT 2300. ADDENDUM Upon arrival in the ICU, patient had intractable nausea and vomiting.  Neurosurgery was notified and a stat CT head was obtained that was unremarkable.  Patient was given Phenergan and Zofran and morphine for pain and symptoms improved.  She is currently resting   Best Practice (right click and "Reselect all SmartList Selections" daily)   Diet/type: Regular consistency (see orders) DVT prophylaxis: SCD GI prophylaxis: PPI Lines: Arterial Line Foley:  N/A Code Status:  full code Last date of multidisciplinary goals of care discussion [PENDING]  Labs   CBC: Recent Labs  Lab 12/18/21 1533 12/19/21 0557 12/20/21 0725  WBC 8.9 9.6 9.7  NEUTROABS 6.8  --  7.2  HGB 11.8* 10.7* 9.5*  HCT 37.5 33.3* 30.5*  MCV 88.2 88.3 88.9  PLT 155 151 124*     Basic Metabolic Panel: Recent Labs  Lab 12/18/21 1533 12/19/21 0706 12/20/21 0725  NA 138 139 140  K 3.8 3.8 3.6  CL 106 103 106  CO2 '27 24 27  '$ GLUCOSE 120* 240* 112*  BUN 18 15 24*  CREATININE 0.91 0.78 0.95  CALCIUM 8.9 8.6* 8.6*  MG  --  1.9 2.1  PHOS  --  4.0 2.6    GFR: Estimated Creatinine Clearance: 43 mL/min (by C-G formula based on SCr of 0.95 mg/dL). Recent Labs  Lab 12/18/21 1533 12/19/21 0557 12/20/21 0725  WBC 8.9 9.6 9.7     Liver Function Tests: Recent Labs  Lab 12/19/21 0706  AST 61*  ALT 37  ALKPHOS 50  BILITOT 0.5  PROT 7.1  ALBUMIN 3.7   No results for input(s): LIPASE, AMYLASE in the last 168 hours. No results for input(s): AMMONIA  in the last 168 hours.  ABG    Component Value Date/Time   PHART 7.369 09/17/2015 0400   PCO2ART 44.4 09/17/2015 0400   PO2ART 80.0 09/17/2015 0400   HCO3 25.4 (H) 09/17/2015 0400   TCO2 22 09/17/2015 1641   ACIDBASEDEF 5.0 (H) 09/16/2015 2344   O2SAT 95.0 09/17/2015 0400      Coagulation Profile: Recent Labs  Lab 12/18/21 1533  INR 1.1     Cardiac Enzymes: No results for input(s): CKTOTAL, CKMB, CKMBINDEX, TROPONINI in the last 168 hours.  HbA1C: Hgb A1c MFr Bld  Date/Time Value Ref Range Status  12/19/2021 05:57 AM 5.7 (H) 4.8 - 5.6 % Final    Comment:    (NOTE)         Prediabetes: 5.7 - 6.4         Diabetes: >6.4         Glycemic control for adults with diabetes: <7.0   09/09/2015 12:38 PM 5.8 (H) 4.8 - 5.6 % Final    Comment:    (NOTE)         Pre-diabetes: 5.7 - 6.4  Diabetes: >6.4         Glycemic control for adults with diabetes: <7.0     CBG: Recent Labs  Lab 12/19/21 1536 12/19/21 1937 12/19/21 2329 12/20/21 0431 12/20/21 0720  GLUCAP 119* 123* 120* 115* 99    Review of Systems:   All systems reviewed.  Pertinent positives include headache, scalp pain, and generalized weakness.  All other systems are negative  Past Medical History:  She,  has a past medical history of Aortic stenosis, Bilateral cataracts, Bleeding nose, Breast cancer ([place]) (1999), Chronic diastolic (congestive) heart failure ([place]), Depression, Diverticulosis, Gastroesophageal reflux disease, Heart murmur, Hyperlipidemia, Hypertension, Iron deficiency anemia, Obstructive sleep apnea, Osteoarthritis, Osteopenia, Peptic ulcer disease, Personal history of radiation therapy, S/P aortic valve replacement with bioprosthetic valve (09/16/2015), Skin cancer (2020), Stroke [facility]), Supraventricular tachycardia ([place]), Syncope and collapse, and Urinary tract infection.   Surgical History:   Past Surgical History:  Procedure Laterality Date   AORTIC VALVE REPLACEMENT N/A 09/16/2015    Procedure: AORTIC VALVE REPLACEMENT (AVR);  Surgeon: Rexene Alberts, MD;  Location: [place];  Service: Open Heart Surgery;  Laterality: N/A;   BREAST BIOPSY Right 1999   breast ca radation   BREAST EXCISIONAL BIOPSY Left yrs ago    benign   BREAST LUMPECTOMY Right 1999   with radiation   CARDIAC CATHETERIZATION N/A 08/17/2015   Procedure: Left Heart Cath and Coronary Angiography;  Surgeon: Minna Merritts, MD;  Location: [place] [facility];  Service: Cardiovascular;  Laterality: N/A;   CATARACT EXTRACTION, BILATERAL     CHOLECYSTECTOMY     CRANIOTOMY Right 12/18/2021   Procedure: CRANIOTOMY HEMATOMA EVACUATION SUBDURAL;  Surgeon: Meade Maw, MD;  Location: [facility];  Service: Neurosurgery;  Laterality: Right;   JOINT REPLACEMENT Bilateral    knee replacement   KNEE ARTHROSCOPY     right   KNEE ARTHROSCOPY     left   SKIN CANCER EXCISION  2020   TEE WITHOUT CARDIOVERSION N/A 09/16/2015   Procedure: TRANSESOPHAGEAL ECHOCARDIOGRAM (TEE);  Surgeon: Rexene Alberts, MD;  Location: [place];  Service: Open Heart Surgery;  Laterality: N/A;   TONSILLECTOMY     VAGINAL HYSTERECTOMY       Social History:   reports that she has never smoked. She has never used smokeless tobacco. She reports that she does not currently use alcohol. She reports that she does not use drugs.   Family History:  Her family history includes Breast cancer in her maternal aunt; Breast cancer (age of onset: 61) in her daughter; Heart failure in her mother.   Allergies Allergies  Allergen Reactions   Evista [Raloxifene]    Sulfa Antibiotics Hives   Codeine Hives   Nitrofurantoin Rash   Oxycodone-Acetaminophen Rash   Statins Other (See Comments) and Rash    Leg muscle cramps     Home Medications  Prior to Admission medications   Medication Sig Start Date End Date Taking? Authorizing Provider  acetaminophen (TYLENOL) 325 MG tablet Take 650 mg by mouth every 6 (six) hours as needed.    [provider]  amoxicillin (AMOXIL) 500 MG tablet Take 4 tablets (2,000 mg total) by mouth as directed. Take 2 hours before surgery or dental work Patient not taking: Reported on 11/06/2021 10/28/19   Minna Merritts, MD  aspirin EC 81 MG tablet Take 1 tablet (81 mg total) by mouth daily. 07/01/18   Minna Merritts, MD  Calcium Carbonate-Vitamin D (CALCIUM 600 + D PO) Take by  mouth 2 (two) times daily.    [provider]  ezetimibe (ZETIA) 10 MG tablet Take 10 mg by mouth daily.    [provider]  fluticasone (FLONASE) 50 MCG/ACT nasal spray Place 2 sprays into the nose daily as needed.    [provider]  ketoconazole (NIZORAL) 2 % shampoo Apply topically 3 (three) times a week. 09/20/21   [provider]  metoprolol tartrate (LOPRESSOR) 25 MG tablet TAKE 1 TABLET BY MOUTH TWICE A DAY 12/15/21   Minna Merritts, MD  Multiple Vitamin (MULTIVITAMIN) tablet Take 1 tablet by mouth daily.    [provider]  oxybutynin (DITROPAN-XL) 5 MG 24 hr tablet Take 5 mg by mouth daily. 11/02/21   [provider]  pantoprazole (PROTONIX) 40 MG tablet Take 40 mg by mouth daily as needed.  Patient not taking: Reported on 11/06/2021 07/04/14   [provider]  PARoxetine (PAXIL) 20 MG tablet Take 20 mg by mouth daily.    [provider]    Critical care provider statement:   Total critical care time: 33 minutes   Performed by: Lanney Gins MD   Critical care time was exclusive of separately billable procedures and treating other patients.   Critical care was necessary to treat or prevent imminent or life-threatening deterioration.   Critical care was time spent personally by me on the following activities: development of treatment plan with patient and/or surrogate as well as nursing, discussions with consultants, evaluation of patient's response to treatment, examination of patient, obtaining history from patient or surrogate, ordering and performing  treatments and interventions, ordering and review of laboratory studies, ordering and review of radiographic studies, pulse oximetry and re-evaluation of patient's condition.    Ottie Glazier, M.D.  Pulmonary & Critical Care Medicine

## 2021-12-21 DIAGNOSIS — S065XAA Traumatic subdural hemorrhage with loss of consciousness status unknown, initial encounter: Secondary | ICD-10-CM | POA: Diagnosis not present

## 2021-12-21 LAB — GLUCOSE, CAPILLARY
Glucose-Capillary: 101 mg/dL — ABNORMAL HIGH (ref 70–99)
Glucose-Capillary: 113 mg/dL — ABNORMAL HIGH (ref 70–99)
Glucose-Capillary: 114 mg/dL — ABNORMAL HIGH (ref 70–99)
Glucose-Capillary: 117 mg/dL — ABNORMAL HIGH (ref 70–99)
Glucose-Capillary: 130 mg/dL — ABNORMAL HIGH (ref 70–99)
Glucose-Capillary: 97 mg/dL (ref 70–99)

## 2021-12-21 LAB — TSH: TSH: 0.568 u[IU]/mL (ref 0.350–4.500)

## 2021-12-21 NOTE — Progress Notes (Signed)
Occupational Therapy Treatment ?Patient Details ?Name: Kayla Gray ?MRN: [id number] ?DOB: 1936/02/27 ?Today's Date: 12/21/2021 ? ? ?History of present illness Pt is a 86 y.o. female who presents to the ED after falling and Pt report of "striking the back of her head". Admitted for Subdural hematoma s/p R craniotomy for evacuation of hematoma (3/6). PmHx: A-Fib, History of Cancer, CHF, Depression, HTN, HLD, OA, GERD. ?  ?OT comments ? Kayla Gray was seen for OT treatment on this date. Upon arrival to room pt reclined in bed, agreeable to tx, family at bedside. Pt requires MOD A + HHA for BSC t/f. SUPERVISION perihygiene in sitting however ultimately needed MAX A in standing for thoroughness. SETUP + SUPERVISION tooth brushing seated EOB. Extensive education with pt and family on AIR, all questions answered. Pt making good progress toward goals. Pt continues to benefit from skilled OT services to maximize return to PLOF and minimize risk of future falls, injury, caregiver burden, and readmission. Will continue to follow POC. Discharge recommendation remains appropriate.  ?  ? ?Recommendations for follow up therapy are one component of a multi-disciplinary discharge planning process, led by the attending physician.  Recommendations may be updated based on patient status, additional functional criteria and insurance authorization. ?   ?Follow Up Recommendations ? Acute inpatient rehab (3hours/day)  ?  ?Assistance Recommended at Discharge Frequent or constant Supervision/Assistance  ?Patient can return home with the following ? A lot of help with walking and/or transfers;A lot of help with bathing/dressing/bathroom;Assistance with cooking/housework;Help with stairs or ramp for entrance ?  ?Equipment Recommendations ? Other (comment) (defer to next venue of care)  ?  ?Recommendations for Other Services   ? ?  ?Precautions / Restrictions Precautions ?Precautions: Fall ?Restrictions ?Weight Bearing Restrictions: No  ? ? ?   ? ?Mobility Bed Mobility ?Overal bed mobility: Needs Assistance ?Bed Mobility: Supine to Sit, Sit to Supine ?  ?  ?Supine to sit: Mod assist ?Sit to supine: Min assist ?  ?  ?  ? ?Transfers ?Overall transfer level: Needs assistance ?Equipment used: 1 person hand held assist ?Transfers: Bed to chair/wheelchair/BSC ?  ?  ?  ?Step pivot transfers: Mod assist ?  ?  ?  ?  ?  ?Balance Overall balance assessment: Needs assistance ?Sitting-balance support: Feet supported, No upper extremity supported ?Sitting balance-Leahy Scale: Good ?  ?  ?Standing balance support: Bilateral upper extremity supported, During functional activity ?Standing balance-Leahy Scale: Poor ?  ?  ?  ?  ?  ?  ?  ?  ?  ?  ?  ?  ?   ? ?ADL either performed or assessed with clinical judgement  ? ?ADL Overall ADL's : Needs assistance/impaired ?  ?  ?  ?  ?  ?  ?  ?  ?  ?  ?  ?  ?  ?  ?  ?  ?  ?  ?  ?General ADL Comments: MOD A + HHA for BSC t/f. SUPERVISION perihygiene in sitting however ultimately needed MAX A in standing for thoroughness. SETUP + SUPERVISION tooth brushing seated EOB. ?  ? ? ? ?Cognition Arousal/Alertness: Awake/alert ?Behavior During Therapy: Flat affect ?Overall Cognitive Status: Impaired/Different from baseline ?Area of Impairment: Memory, Following commands, Problem solving ?  ?  ?  ?  ?  ?  ?  ?  ?  ?  ?Memory: Decreased recall of precautions, Decreased short-term memory ?Following Commands: Follows one step commands with increased time ?  ?  ?  Problem Solving: Decreased initiation, Difficulty sequencing, Requires verbal cues, Requires tactile cues, Slow processing ?General Comments: Pt requires MOD cues for safety with mobility. no cues for sequencing ADLs. ?  ?  ?   ?   ?   ?   ? ? ?Pertinent Vitals/ Pain       Pain Assessment ?Pain Assessment: Faces ?Faces Pain Scale: Hurts even more ?Pain Location: head ?Pain Descriptors / Indicators: Headache ?Pain Intervention(s): Limited activity within patient's tolerance, Patient  requesting pain meds-RN notified ? ? ?Frequency ? Min 4X/week  ? ? ? ? ?  ?Progress Toward Goals ? ?OT Goals(current goals can now be found in the care plan section) ? Progress towards OT goals: Progressing toward goals ? ?Acute Rehab OT Goals ?Patient Stated Goal: to go home once able ?OT Goal Formulation: With patient/family ?Time For Goal Achievement: 01/03/22 ?Potential to Achieve Goals: Good ?ADL Goals ?Pt Will Perform Grooming: with supervision;sitting ?Pt Will Transfer to Toilet: with min guard assist;stand pivot transfer;bedside commode ?Pt Will Perform Toileting - Clothing Manipulation and hygiene: with min assist;sitting/lateral leans  ?Plan Discharge plan remains appropriate;Frequency remains appropriate   ? ?Co-evaluation ? ? ?   ?  ?  ?  ?  ? ?  ?AM-PAC OT "6 Clicks" Daily Activity     ?Outcome Measure ? ? Help from another person eating meals?: A Little ?Help from another person taking care of personal grooming?: A Little ?Help from another person toileting, which includes using toliet, bedpan, or urinal?: A Lot ?Help from another person bathing (including washing, rinsing, drying)?: A Lot ?Help from another person to put on and taking off regular upper body clothing?: A Little ?Help from another person to put on and taking off regular lower body clothing?: A Lot ?6 Click Score: 15 ? ?  ?End of Session Equipment Utilized During Treatment: Oxygen ? ?OT Visit Diagnosis: Unsteadiness on feet (R26.81);Other symptoms and signs involving the nervous system (R29.898);History of falling (Z91.81) ?  ?Activity Tolerance Patient tolerated treatment well ?  ?Patient Left in bed;with call bell/phone within reach;with bed alarm set;with family/visitor present ?  ?Nurse Communication Patient requests pain meds ?  ? ?   ? ?Time: 4619-0122 ?OT Time Calculation (min): 53 min ? ?Charges: OT General Charges ?$OT Visit: 1 Visit ?OT Treatments ?$Self Care/Home Management : 53-67 mins ? ?Dessie Coma, M.S. OTR/L  ?12/21/21,  4:18 PM  ?ascom [phone] ? ?

## 2021-12-21 NOTE — Consult Note (Signed)
History and Physical    Kayla Gray KGY:[id number] DOB: 1935/11/11 DOA: 12/18/2021  PCP: Derinda Late, MD (Confirm with patient/family/NH records and if not entered, this has to be entered at [facility] point of entry) Patient coming from: Home  I have personally briefly reviewed patient's old medical records in [place]  Chief Complaint: I do not remember what had happened.  HPI: Kayla Gray is a 86 y.o. female with medical history significant of AS s/p bovine aortic valve replacement, cancer status post lumpectomy and radiation therapy, chronic diastolic CHF, HTN, paroxysmal SVT, who came to the hospital after 1 syncope episode.  While patient remembered was she was getting up from her bed in the morning, then the next thing was she woke up found herself on the floor with severe pain in the back of the head, it appears that she lost her consciousness and fell down and hit her head.  However she does not remember whether she had any prodromes of lightheadedness blurry vision feeling nauseous or palpitations before or during the episode.  She was by herself when this happened at home.  ED work-up found the patient has mixed density right subdural hematoma measuring up to 1.3 cm extending along the right hemispheric convexity, along the falx and the right tentorium.  Leftward midline shift measuring up to 1.60 cm with partial effacement of the right lateral ventricle.  Patient then underwent right craniotomy for evacuation of the hematoma.  Syncope work-up so far has been telemetry monitoring x24 hours.  And the patient's glucose was increased to 199 on admission, A1c 5.7.  Patient has no history of diabetes or prediabetes.  She does not recall any episodes of hypoglycemia such as nauseous vomiting, sweating recently.  She has a history of severe AS underwent bovine aortic valve replacement about 3 years ago.  She denied any chest pain, lightheadedness or shortness of breath with  ambulating at home.   Review of Systems: As per HPI otherwise 14 point review of systems negative.    Past Medical History:  Diagnosis Date   Aortic stenosis    Bilateral cataracts    Bleeding nose    Thurs night (09/08/15) and Fri (09/09/15) left side.Bleeding didn't last long   Breast cancer ([place]) 1999   right breast lumpectomy and rad tx   Chronic diastolic (congestive) heart failure (HCC)    Depression    Diverticulosis    Gastroesophageal reflux disease    Heart murmur    Hyperlipidemia    Hypertension    Iron deficiency anemia    Obstructive sleep apnea    Osteoarthritis    Osteopenia    Peptic ulcer disease    Personal history of radiation therapy    S/P aortic valve replacement with bioprosthetic valve 09/16/2015   23 mm Edwards Intuity Elite bioprosthetic tissue valve   Skin cancer 2020   Stroke ([place])    11/14/13   Supraventricular tachycardia (HCC)    Syncope and collapse    Urinary tract infection     Past Surgical History:  Procedure Laterality Date   AORTIC VALVE REPLACEMENT N/A 09/16/2015   Procedure: AORTIC VALVE REPLACEMENT (AVR);  Surgeon: Rexene Alberts, MD;  Location: [place];  Service: Open Heart Surgery;  Laterality: N/A;   BREAST BIOPSY Right 1999   breast ca radation   BREAST EXCISIONAL BIOPSY Left yrs ago    benign   BREAST LUMPECTOMY Right 1999   with radiation   CARDIAC CATHETERIZATION N/A 08/17/2015  Procedure: Left Heart Cath and Coronary Angiography;  Surgeon: Minna Merritts, MD;  Location: [place] [facility];  Service: Cardiovascular;  Laterality: N/A;   CATARACT EXTRACTION, BILATERAL     CHOLECYSTECTOMY     CRANIOTOMY Right 12/18/2021   Procedure: CRANIOTOMY HEMATOMA EVACUATION SUBDURAL;  Surgeon: Meade Maw, MD;  Location: [facility];  Service: Neurosurgery;  Laterality: Right;   JOINT REPLACEMENT Bilateral    knee replacement   KNEE ARTHROSCOPY     right   KNEE ARTHROSCOPY     left   SKIN CANCER EXCISION  2020   TEE  WITHOUT CARDIOVERSION N/A 09/16/2015   Procedure: TRANSESOPHAGEAL ECHOCARDIOGRAM (TEE);  Surgeon: Rexene Alberts, MD;  Location: [place];  Service: Open Heart Surgery;  Laterality: N/A;   TONSILLECTOMY     VAGINAL HYSTERECTOMY       reports that she has never smoked. She has never used smokeless tobacco. She reports that she does not currently use alcohol. She reports that she does not use drugs.  Allergies  Allergen Reactions   Evista [Raloxifene]    Sulfa Antibiotics Hives   Codeine Hives   Nitrofurantoin Rash   Oxycodone-Acetaminophen Rash   Statins Other (See Comments) and Rash    Leg muscle cramps    Family History  Problem Relation Age of Onset   Heart failure Mother    Breast cancer Daughter 93   Breast cancer Maternal Aunt      Prior to Admission medications   Medication Sig Start Date End Date Taking? Authorizing Provider  aspirin EC 81 MG tablet Take 1 tablet (81 mg total) by mouth daily. 07/01/18  Yes Gollan, [name] November, MD  Calcium Carbonate-Vitamin D (CALCIUM 600 + D PO) Take by mouth 2 (two) times daily.   Yes [provider]  ezetimibe (ZETIA) 10 MG tablet Take 10 mg by mouth daily.   Yes [provider]  fluticasone (FLONASE) 50 MCG/ACT nasal spray Place 2 sprays into the nose daily as needed.   Yes [provider]  metoprolol tartrate (LOPRESSOR) 25 MG tablet TAKE 1 TABLET BY MOUTH TWICE A DAY 12/15/21  Yes Gollan, [name] November, MD  Multiple Vitamin (MULTIVITAMIN) tablet Take 1 tablet by mouth daily.   Yes [provider]  pantoprazole (PROTONIX) 40 MG tablet Take 40 mg by mouth daily as needed. 07/04/14  Yes [provider]  acetaminophen (TYLENOL) 325 MG tablet Take 650 mg by mouth every 6 (six) hours as needed.    [provider]  amoxicillin (AMOXIL) 500 MG tablet Take 4 tablets (2,000 mg total) by mouth as directed. Take 2 hours before surgery or dental work Patient not taking: Reported on 11/06/2021 10/28/19    Minna Merritts, MD  ketoconazole (NIZORAL) 2 % shampoo Apply topically 3 (three) times a week. 09/20/21   [provider]  oxybutynin (DITROPAN-XL) 5 MG 24 hr tablet Take 5 mg by mouth daily. 11/02/21   [provider]  PARoxetine (PAXIL) 20 MG tablet Take 20 mg by mouth daily.    [provider]    Physical Exam: Vitals:   12/21/21 0424 12/21/21 0736 12/21/21 1120 12/21/21 1624  BP: 140/74 (!) 145/71 123/68 (!) 162/85  Pulse: 78 77 81 75  Resp: '16 17 19 18  '$ Temp: 98 F (36.7 C)  98.8 F (37.1 C) 98.2 F (36.8 C)  TempSrc: Oral     SpO2: 97% 98% 99% 100%  Weight:      Height:  Constitutional: NAD, calm, comfortable Vitals:   12/21/21 0424 12/21/21 0736 12/21/21 1120 12/21/21 1624  BP: 140/74 (!) 145/71 123/68 (!) 162/85  Pulse: 78 77 81 75  Resp: '16 17 19 18  '$ Temp: 98 F (36.7 C)  98.8 F (37.1 C) 98.2 F (36.8 C)  TempSrc: Oral     SpO2: 97% 98% 99% 100%  Weight:      Height:       Eyes: PERRL, lids and conjunctivae normal ENMT: Mucous membranes are moist. Posterior pharynx clear of any exudate or lesions.Normal dentition.  Neck: normal, supple, no masses, no thyromegaly Respiratory: clear to auscultation bilaterally, no wheezing, no crackles. Normal respiratory effort. No accessory muscle use.  Cardiovascular: Regular rate and rhythm, no murmurs / rubs / gallops. No extremity edema. 2+ pedal pulses. No carotid bruits.  Abdomen: no tenderness, no masses palpated. No hepatosplenomegaly. Bowel sounds positive.  Musculoskeletal: no clubbing / cyanosis. No joint deformity upper and lower extremities. Good ROM, no contractures. Normal muscle tone.  Skin: no rashes, lesions, ulcers. No induration Neurologic: CN 2-12 grossly intact. Sensation intact, DTR normal. Strength 5/5 in all 4.  Psychiatric: Normal judgment and insight. Alert and oriented x 3. Normal mood.    Labs on Admission: I have personally reviewed following labs and imaging  studies  CBC: Recent Labs  Lab 12/18/21 1533 12/19/21 0557 12/20/21 0725  WBC 8.9 9.6 9.7  NEUTROABS 6.8  --  7.2  HGB 11.8* 10.7* 9.5*  HCT 37.5 33.3* 30.5*  MCV 88.2 88.3 88.9  PLT 155 151 482*   Basic Metabolic Panel: Recent Labs  Lab 12/18/21 1533 12/19/21 0706 12/20/21 0725  NA 138 139 140  K 3.8 3.8 3.6  CL 106 103 106  CO2 '27 24 27  '$ GLUCOSE 120* 240* 112*  BUN 18 15 24*  CREATININE 0.91 0.78 0.95  CALCIUM 8.9 8.6* 8.6*  MG  --  1.9 2.1  PHOS  --  4.0 2.6   GFR: Estimated Creatinine Clearance: 43 mL/min (by C-G formula based on SCr of 0.95 mg/dL). Liver Function Tests: Recent Labs  Lab 12/19/21 0706  AST 61*  ALT 37  ALKPHOS 50  BILITOT 0.5  PROT 7.1  ALBUMIN 3.7   No results for input(s): LIPASE, AMYLASE in the last 168 hours. No results for input(s): AMMONIA in the last 168 hours. Coagulation Profile: Recent Labs  Lab 12/18/21 1533  INR 1.1   Cardiac Enzymes: No results for input(s): CKTOTAL, CKMB, CKMBINDEX, TROPONINI in the last 168 hours. BNP (last 3 results) No results for input(s): PROBNP in the last 8760 hours. HbA1C: Recent Labs    12/19/21 0557  HGBA1C 5.7*   CBG: Recent Labs  Lab 12/21/21 0008 12/21/21 0428 12/21/21 0736 12/21/21 1121 12/21/21 1622  GLUCAP 101* 113* 117* 114* 97   Lipid Profile: Recent Labs    12/19/21 0557  CHOL 161  HDL 50  LDLCALC 93  TRIG 89  CHOLHDL 3.2   Thyroid Function Tests: No results for input(s): TSH, T4TOTAL, FREET4, T3FREE, THYROIDAB in the last 72 hours. Anemia Panel: No results for input(s): VITAMINB12, FOLATE, FERRITIN, TIBC, IRON, RETICCTPCT in the last 72 hours. Urine analysis:    Component Value Date/Time   COLORURINE YELLOW (A) 08/24/2020 1737   APPEARANCEUR HAZY (A) 08/24/2020 1737   APPEARANCEUR Cloudy 10/31/2014 0815   LABSPEC 1.029 08/24/2020 1737   LABSPEC 1.013 10/31/2014 0815   PHURINE 5.0 08/24/2020 1737   GLUCOSEU NEGATIVE 08/24/2020 1737   GLUCOSEU Negative  10/31/2014 0815   HGBUR NEGATIVE 08/24/2020 1737   BILIRUBINUR NEGATIVE 08/24/2020 1737   BILIRUBINUR Negative 10/31/2014 0815   KETONESUR NEGATIVE 08/24/2020 1737   PROTEINUR NEGATIVE 08/24/2020 1737   NITRITE NEGATIVE 08/24/2020 1737   LEUKOCYTESUR NEGATIVE 08/24/2020 1737   LEUKOCYTESUR 3+ 10/31/2014 0815    Radiological Exams on Admission: No results found.  EKG: Independently reviewed.  Sinus, no PR or QTc intervals changes.  Assessment/Plan Principal Problem:   Subdural hematoma  (please populate well all problems here in Problem List. (For example, if patient is on BP meds at home and you resume or decide to hold them, it is a problem that needs to be her. Same for CAD, COPD, HLD and so on)  Syncope -No pro-dromes, -But given strong history of PAF and paroxysmal SVT in the past, suspect cardiac syncope, unclear telemetry monitoring since yesterday, there were 2 episodes of paroxysmal SVTs and both episode less than 10 seconds and the patient was asymptomatic.  I recommend continue telemetry monitoring while patient in the hospital, and also recommend outpatient Zio patch.  Consider consult Dr. [name] tomorrow after Echo.  Subdural hematoma status post craniotomy and hematoma evacuation -As per neurosurgery -On prophylactic Keppra  PAF -Sinus rhythm, on metoprolol for rate control -Off Coumadin as per cardiology about 1 years ago.  Paroxysmal SVT -Continue telemetry monitoring, likely will need outpatient Zio patch/Holter monitoring.     Lequita Halt MD [facility] Pager [phone]  12/21/2021, 5:24 PM

## 2021-12-21 NOTE — Progress Notes (Signed)
Physical Therapy Treatment ?Patient Details ?Name: Kayla Gray ?MRN: [id number] ?DOB: 01/13/1936 ?Today's Date: 12/21/2021 ? ? ?History of Present Illness Pt is a 86 y.o. female who presents to the ED after falling and Pt report of "striking the back of her head". Admitted for Subdural hematoma s/p R craniotomy for evacuation of hematoma (3/6). PmHx: A-Fib, History of Cancer, CHF, Depression, HTN, HLD, OA, GERD. ? ?  ?PT Comments  ? ? Pt was asleep in long sitting upon arriving. She does awake but remains very lethargic throughout session. When pt is awake, she seems oriented and is able to follow simple commands consistently throughout. Pt is very sweet/motivated but greatly limited and severely different than baseline. She tolerated sitting EOB and performing standing 1 x however due to lethargy, was unsafe to ambulate away from EOB. On 4 L O2 throughout session. CIR still seems most appropriate at DC. Will continue to benefit from skilled PT to address deficits while advancing to PLOF.    ?  ?Recommendations for follow up therapy are one component of a multi-disciplinary discharge planning process, led by the attending physician.  Recommendations may be updated based on patient status, additional functional criteria and insurance authorization. ? ?Follow Up Recommendations ? Acute inpatient rehab (3hours/day) ?  ?  ?Assistance Recommended at Discharge Frequent or constant Supervision/Assistance  ?Patient can return home with the following A lot of help with walking and/or transfers;A lot of help with bathing/dressing/bathroom;Assistance with cooking/housework;Assist for transportation;Help with stairs or ramp for entrance ?  ?Equipment Recommendations ? None recommended by PT  ?  ?   ?Precautions / Restrictions Precautions ?Precautions: Fall ?Restrictions ?Weight Bearing Restrictions: No  ?  ? ?Mobility ? Bed Mobility ?Overal bed mobility: Needs Assistance ?Bed Mobility: Supine to Sit ?  ?  ?Supine to sit: Min  assist, Mod assist, HOB elevated ?Sit to supine: Mod assist, Max assist, HOB elevated ?  ?General bed mobility comments: pt required increased assistance this date due to lethargy and struggling to stay away throughout session. ?  ? ?Transfers ?Overall transfer level: Needs assistance ?Equipment used: Rolling walker (2 wheels) ?Transfers: Sit to/from Stand ?Sit to Stand: Mod assist ?  ?  ?General transfer comment: pt stood 1 x EOB with mod assist + bed height elevated. pt stood ~ 5 sec prior to reporting" I dont feel well." Pt was returned to bed and repositioned. unable to state what didnt feel well prior to falling back to sleep. ?  ? ?Ambulation/Gait ? General Gait Details: pt too lethargic to safely advance away form EOB ? ? ?  ?Balance Overall balance assessment: Needs assistance ?Sitting-balance support: Bilateral upper extremity supported, Feet supported ?Sitting balance-Leahy Scale: Fair ?  ?  ?Standing balance support: Bilateral upper extremity supported, During functional activity, Reliant on assistive device for balance ?Standing balance-Leahy Scale: Poor ?  ?  ?  ?  ?Cognition Arousal/Alertness: Lethargic ?Behavior During Therapy: Flat affect (extremely lethargic throughout) ?Overall Cognitive Status: Impaired/Different from baseline ?Area of Impairment: Orientation ?  ? Orientation Level: Person, Place, Time, Situation ?  ? General Comments: pt greatly limited by lethargy. She struggles to stay awake long enough to fully participate. needs constant engagement for staying awake. ?  ?  ? ?  ?   ?   ? ?Pertinent Vitals/Pain Pain Assessment ?Facial Expression: Grimacing ?Body Movements: Absence of movements ?Muscle Tension: Tense, rigid ?Compliance with ventilator (intubated pts.): N/A ?Vocalization (extubated pts.): Sighing, moaning ?CPOT Total: 4 ?Pain Location: head ?Pain Descriptors /  Indicators: Headache ?Pain Intervention(s): Limited activity within patient's tolerance, Monitored during session,  Premedicated before session, Repositioned  ? ? ? ?PT Goals (current goals can now be found in the care plan section) Acute Rehab PT Goals ?Patient Stated Goal: none stated ?Progress towards PT goals: Progressing toward goals ? ?  ?Frequency ? ? ? 7X/week ? ? ? ?  ?PT Plan Current plan remains appropriate  ? ? ?   ?AM-PAC PT "6 Clicks" Mobility   ?Outcome Measure ? Help needed turning from your back to your side while in a flat bed without using bedrails?: A Little ?Help needed moving from lying on your back to sitting on the side of a flat bed without using bedrails?: A Little ?Help needed moving to and from a bed to a chair (including a wheelchair)?: A Little ?Help needed standing up from a chair using your arms (e.g., wheelchair or bedside chair)?: A Little ?Help needed to walk in hospital room?: A Lot ?Help needed climbing 3-5 steps with a railing? : A Lot ?6 Click Score: 16 ? ?  ?End of Session Equipment Utilized During Treatment: Gait belt ?Activity Tolerance: Patient limited by lethargy ?Patient left: in bed;with call bell/phone within reach;with bed alarm set ?Nurse Communication: Mobility status ?PT Visit Diagnosis: Unsteadiness on feet (R26.81);History of falling (Z91.81);Muscle weakness (generalized) (M62.81) ?  ? ? ?Time: 5329-9242 ?PT Time Calculation (min) (ACUTE ONLY): 24 min ? ?Charges:  $Therapeutic Activity: 23-37 mins          ?          ? ?Julaine Fusi PTA ?12/21/21, 11:11 AM  ? ?

## 2021-12-21 NOTE — Care Management Important Message (Signed)
Important Message ? ?Patient Details  ?Name: Kayla Gray ?MRN: [id number] ?Date of Birth: 02/18/1936 ? ? ?Medicare Important Message Given:  Yes ? ? ? ? ?[name] A [name] ?12/21/2021, 11:06 AM ?

## 2021-12-21 NOTE — Progress Notes (Signed)
? ? Attending Progress Note ? ?History: Kayla Gray is s/p right craniotomy for SDH evacuation  ? ?POD3: NAEO ? ?POD2: AMS yesterday. Head CT, MRI, and EEG without explanation. Improved with holding narcotics. Pt without complaints this morning ? ?POD1: Complaints of headache overnight. Had CT head showing improved shift. Pt has been drowsy since 4 am.  ? ?Physical Exam: ?Vitals:  ? 12/21/21 0424 12/21/21 0736  ?BP: 140/74 (!) 145/71  ?Pulse: 78 77  ?Resp: 16 17  ?Temp: 98 ?F (36.7 ?C)   ?SpO2: 97% 98%  ? ? ?Drowsy but arouses briefly to physical stimuli ?Oriented to person.  ?No pronator drift.  ?Follows commands x 4. ? ?Data: ? ?Recent Labs  ?Lab 12/18/21 ?1533 12/19/21 ?0706 12/20/21 ?0725  ?NA 138 139 140  ?K 3.8 3.8 3.6  ?CL 106 103 106  ?CO2 '27 24 27  '$ ?BUN 18 15 24*  ?CREATININE 0.91 0.78 0.95  ?GLUCOSE 120* 240* 112*  ?CALCIUM 8.9 8.6* 8.6*  ? ? ?Recent Labs  ?Lab 12/19/21 ?0706  ?AST 61*  ?ALT 37  ?ALKPHOS 50  ? ?  ? Recent Labs  ?Lab 12/18/21 ?1533 12/19/21 ?9604 12/20/21 ?0725  ?WBC 8.9 9.6 9.7  ?HGB 11.8* 10.7* 9.5*  ?HCT 37.5 33.3* 30.5*  ?PLT 155 151 124*  ? ? ?Recent Labs  ?Lab 12/18/21 ?1533  ?INR 1.1  ? ?  ?   ? ? ?Other tests/results:  ?CT head 12/19/21 ?IMPRESSION: ?1. Postoperative changes from interval right sided craniotomy for ?subdural evacuation. Residual small volume subdural hemorrhage along ?the falx and right tentorium measuring up to 6 mm in maximal ?thickness. Improved mass effect with trace 2 mm right-to-left shift. ?No hydrocephalus or trapping. ?2. No other new acute intracranial abnormality. ?Electronically Signed ?  By: Jeannine Boga M.D. ?  On: 12/19/2021 00:50 ? ?MRI brain 12/19/21 ?IMPRESSION: ?1. Small region of acute ischemia in the right superior frontal ?gyrus near the vertex corresponding to the finding on the same-day ?head CT, and punctate acute infarct in the right insular cortex. ?2. Stable postsurgical changes reflecting right frontoparietal ?craniotomy for  subdural hematoma evacuation. Residual subdural blood ?over the right cerebral convexity, layering along the falx ?posteriorly, and right tentorial leaflet is overall similar to the ?same-day head CT, with minimal mass effect and no measurable midline ?shift. ?3. Subdural collections are also seen overlying the bilateral ?cerebellar hemispheres extending to the craniocervical junction. ?Please also see the separately dictated cervical spine MRI. ?  ?Electronically Signed ?  By: Valetta Mole M.D. ?  On: 12/19/2021 15:08 ? ?MRI C-spine 12/19/21 ?IMPRESSION: ?Extramedullary hemorrhage within the upper cervical spinal canal ?(extending from the craniocervical junction to the C5 level). ?Resultant mild-to-moderate narrowing of the upper cervical spinal ?canal (without spinal cord mass effect). ?  ?Cervical spondylosis, as outlined and having progressed at multiple ?levels since the prior MRI of 05/01/2017. ?  ?At C6-C7, there is progressive moderate/advanced disc degeneration ?with mild degenerative endplate edema. A posterior disc osteophyte ?complex contributes to progressive moderate spinal canal stenosis. ?Multifactorial severe bilateral neural foraminal narrowing also ?present at this level. ?  ?No more than mild degenerative spinal canal narrowing at the ?remaining levels. ?  ?Multifactorial moderate left neural foraminal narrowing at C5-C6. ?  ?  ?Electronically Signed ?  By: [name] Simmering D.O. ?  On: 12/19/2021 15:23 ? ?EEG 12/19/21 ?IMPRESSION: ?This study is suggestive of cortical dysfunction arising from right hemisphere, maximal right frontal region consistent with underlying craniotomy and SDH.  Additionally there is moderate diffuse encephalopathy, nonspecific etiology. No seizures were seen throughout the recording. ?  ?[name]  ? ?Assessment/Plan: ? ?Kayla Gray is a 86 y.o presenting after a fall with a right sided SDH with midline shift and left sided weakness s/p right craniotomy for  evacuation ? ?- Started ppx Lovenox  ?- hold all sedative medications ?- continue PT/OT. Considering IPR ?- please page neurosurgery with any questions or concerns ? ?Cooper Render PA-C ?Department of Neurosurgery ? ?  ?

## 2021-12-21 NOTE — Progress Notes (Addendum)
Inpatient Rehab Admissions Coordinator:  ? ?Spoke to pt's spouse on the phone to discuss CIR recommendations and goals/expectations of CIR stay.  I reviewed 3 hrs/day of therapy and average length of stay 2 weeks with close physician follow up.  Spouse is reluctant to make a decision regarding CIR without pt input but states that "her mind just isn't clear enough to make that decision and I don't see where it's going to be a quick process."  Tried to explain that he would likely need to discuss with pt and family members and make decision before cognition improved, and that cognition was something we typically address on rehab, but he continues to be hesitant.  I will try and f/u tomorrow but spouse would benefit from conversation with medical team regarding pt's cognition and expectations.   ? ?Addendum: Spoke to pt's daughter over the phone to discuss CIR recommendations goals/expectations.  Per daughter they would love inpatient rehab.  I explained likely need for 24/7 supervision and she is concerned that pt may not have that available.  Spouse is home but dtr concerned about his ability to safely provide assist for patient.  She is going to discuss with other family members this evening and get back to me tomorrow.  I also requested SLP cog eval to better assist me in estimating goals from CIR. I will f/u with them tomorrow.  ? ?Shann Medal, PT, DPT ?Admissions Coordinator ?[phone] ?12/21/21  ?3:20 PM ? ?

## 2021-12-22 ENCOUNTER — Inpatient Hospital Stay (HOSPITAL_COMMUNITY)
Admit: 2021-12-22 | Discharge: 2021-12-22 | Disposition: A | Discharge status: Home / Self Care | Payer: PPO | Attending: Internal Medicine | Admitting: Internal Medicine

## 2021-12-22 DIAGNOSIS — R55 Syncope and collapse: Secondary | ICD-10-CM | POA: Diagnosis not present

## 2021-12-22 DIAGNOSIS — S065XAA Traumatic subdural hemorrhage with loss of consciousness status unknown, initial encounter: Secondary | ICD-10-CM | POA: Diagnosis not present

## 2021-12-22 LAB — ECHOCARDIOGRAM COMPLETE
AR max vel: 2.55 cm2
AV Area VTI: 1.98 cm2
AV Area mean vel: 2.25 cm2
AV Mean grad: 2 mmHg
AV Peak grad: 3.3 mmHg
Ao pk vel: 0.91 m/s
Area-P 1/2: 3.15 cm2
Height: 62 in
MV VTI: 1.74 cm2
S' Lateral: 2.53 cm
Weight: 2705.49 oz

## 2021-12-22 LAB — GLUCOSE, CAPILLARY
Glucose-Capillary: 104 mg/dL — ABNORMAL HIGH (ref 70–99)
Glucose-Capillary: 107 mg/dL — ABNORMAL HIGH (ref 70–99)
Glucose-Capillary: 129 mg/dL — ABNORMAL HIGH (ref 70–99)
Glucose-Capillary: 137 mg/dL — ABNORMAL HIGH (ref 70–99)
Glucose-Capillary: 91 mg/dL (ref 70–99)
Glucose-Capillary: 95 mg/dL (ref 70–99)
Glucose-Capillary: 99 mg/dL (ref 70–99)

## 2021-12-22 MED ORDER — PANTOPRAZOLE SODIUM 40 MG PO TBEC
40.0000 mg | DELAYED_RELEASE_TABLET | Freq: Every day | ORAL | Status: DC
  Administered 2021-12-23 – 2021-12-25 (×3): 40 mg via ORAL
  Filled 2021-12-22 (×3): qty 1

## 2021-12-22 MED ORDER — INSULIN ASPART 100 UNIT/ML IJ SOLN: 0.0000 [IU] | Freq: Every day | INTRAMUSCULAR | Status: DC

## 2021-12-22 MED ORDER — INSULIN ASPART 100 UNIT/ML IJ SOLN
0.0000 [IU] | Freq: Three times a day (TID) | INTRAMUSCULAR | Status: DC
  Administered 2021-12-22: 1 [IU] via SUBCUTANEOUS
  Administered 2021-12-23: 2 [IU] via SUBCUTANEOUS
  Filled 2021-12-22 (×2): qty 1

## 2021-12-22 NOTE — Assessment & Plan Note (Signed)
Remained in sinus rhythm. ?-Continue to monitor ?

## 2021-12-22 NOTE — Progress Notes (Signed)
Occupational Therapy Treatment ?Patient Details ?Name: Kayla Gray ?MRN: [id number] ?DOB: 07/03/1936 ?Today's Date: 12/22/2021 ? ? ?History of present illness Pt is a 86 y.o. female who presents to the ED after falling and Pt report of "striking the back of her head". Admitted for Subdural hematoma s/p R craniotomy for evacuation of hematoma (3/6). PmHx: A-Fib, History of Cancer, CHF, Depression, HTN, HLD, OA, GERD. ?  ?OT comments ? Ms Downard was seen for OT treatment on this date. Upon arrival to room pt seated in chair, family at bed side, agreeable to tx. Pt requires MIN A + RW for toilet t/f decreasing to MOD A + HHA when trialed without AD. MIN A hand washing standing sink side. Pt completes card sorting activity with 100% accuracy sorting by suit. Difficulty noted maintaining grip on full deck with L hand holding cards and R hand flipping cards, improved with card split into thirds. Attempted using L hand to flip with difficulty supinating and maintaining grip.  ? ?Pt making good progress toward goals. Pt continues to benefit from skilled OT services to maximize return to PLOF and minimize risk of future falls, injury, caregiver burden, and readmission. Will continue to follow POC. Discharge recommendation remains appropriate.  ?  ? ?Recommendations for follow up therapy are one component of a multi-disciplinary discharge planning process, led by the attending physician.  Recommendations may be updated based on patient status, additional functional criteria and insurance authorization. ?   ?Follow Up Recommendations ? Acute inpatient rehab (3hours/day)  ?  ?Assistance Recommended at Discharge Frequent or constant Supervision/Assistance  ?Patient can return home with the following ? A lot of help with walking and/or transfers;A lot of help with bathing/dressing/bathroom;Assistance with cooking/housework;Help with stairs or ramp for entrance ?  ?Equipment Recommendations ? Other (comment) (defer)  ?   ?Recommendations for Other Services   ? ?  ?Precautions / Restrictions Precautions ?Precautions: Fall ?Restrictions ?Weight Bearing Restrictions: No  ? ? ?  ? ?Mobility Bed Mobility ?Overal bed mobility: Needs Assistance ?Bed Mobility: Sit to Supine ?  ?  ?  ?Sit to supine: Min guard ?  ?  ?  ? ?Transfers ?Overall transfer level: Needs assistance ?Equipment used: Rolling walker (2 wheels) ?Transfers: Sit to/from Stand ?Sit to Stand: Min assist ?  ?  ?  ?  ?  ?  ?  ?  ?Balance Overall balance assessment: Needs assistance ?Sitting-balance support: Feet supported, No upper extremity supported ?Sitting balance-Leahy Scale: Good ?  ?  ?Standing balance support: No upper extremity supported, During functional activity ?Standing balance-Leahy Scale: Fair ?  ?  ?  ?  ?  ?  ?  ?  ?  ?  ?  ?  ?   ? ?ADL either performed or assessed with clinical judgement  ? ?ADL Overall ADL's : Needs assistance/impaired ?  ?  ?  ?  ?  ?  ?  ?  ?  ?  ?  ?  ?  ?  ?  ?  ?  ?  ?  ?General ADL Comments: MIN A + RW for toilet t/f decreasing to MOD A + HHA when trialed without AD. MIN A hand washing standing sink side. ?  ? ? ? ?Cognition Arousal/Alertness: Awake/alert ?Behavior During Therapy: Flat affect ?Overall Cognitive Status: Impaired/Different from baseline ?Area of Impairment: Memory, Following commands, Safety/judgement ?  ?  ?  ?  ?  ?  ?  ?  ?  ?  ?Memory: Decreased  short-term memory ?Following Commands: Follows one step commands with increased time ?Safety/Judgement: Decreased awareness of safety, Decreased awareness of deficits ?  ?Problem Solving: Decreased initiation, Difficulty sequencing, Requires verbal cues, Requires tactile cues, Slow processing ?  ?  ?  ?   ?   ?   ?General Comments SpO2 95% on RA  ? ? ?Pertinent Vitals/ Pain       Pain Assessment ?Pain Assessment: No/denies pain ? ?Home Living   ?  ?Available Help at Discharge:  (limit assistance by family) ?Type of Home: House ?  ?  ?  ?  ?  ?  ?  ?  ?  ?  ?  ?  ?  ?  ?  Lives With: Spouse ? ?  ?Prior Functioning/Environment    ?  ?  ?  ?   ? ?Frequency ? Min 4X/week  ? ? ? ? ?  ?Progress Toward Goals ? ?OT Goals(current goals can now be found in the care plan section) ? Progress towards OT goals: Progressing toward goals ? ?Acute Rehab OT Goals ?Patient Stated Goal: to go home ?OT Goal Formulation: With patient/family ?Time For Goal Achievement: 01/03/22 ?Potential to Achieve Goals: Good ?ADL Goals ?Pt Will Perform Grooming: with supervision;sitting ?Pt Will Transfer to Toilet: with min guard assist;stand pivot transfer;bedside commode ?Pt Will Perform Toileting - Clothing Manipulation and hygiene: with min assist;sitting/lateral leans  ?Plan Discharge plan remains appropriate;Frequency remains appropriate   ? ?Co-evaluation ? ? ?   ?  ?  ?  ?  ? ?  ?AM-PAC OT "6 Clicks" Daily Activity     ?Outcome Measure ? ? Help from another person eating meals?: A Little ?Help from another person taking care of personal grooming?: A Little ?Help from another person toileting, which includes using toliet, bedpan, or urinal?: A Little ?Help from another person bathing (including washing, rinsing, drying)?: A Lot ?Help from another person to put on and taking off regular upper body clothing?: A Little ?Help from another person to put on and taking off regular lower body clothing?: A Lot ?6 Click Score: 16 ? ?  ?End of Session Equipment Utilized During Treatment: Rolling walker (2 wheels) ? ?OT Visit Diagnosis: Unsteadiness on feet (R26.81);Other symptoms and signs involving the nervous system (R29.898);History of falling (Z91.81) ?  ?Activity Tolerance Patient tolerated treatment well ?  ?Patient Left in bed;with call bell/phone within reach;with bed alarm set;with family/visitor present;with nursing/sitter in room ?  ?Nurse Communication   ?  ? ?   ? ?Time: 1478-2956 ?OT Time Calculation (min): 41 min ? ?Charges: OT General Charges ?$OT Visit: 1 Visit ?OT Treatments ?$Self Care/Home Management :  23-37 mins ?$Therapeutic Activity: 8-22 mins ? ?Dessie Coma, M.S. OTR/L  ?12/22/21, 4:33 PM  ?ascom [phone] ? ?

## 2021-12-22 NOTE — Assessment & Plan Note (Signed)
Patient developed large subdural hematoma with a midline shift after hitting her head during the syncopal episode s/p right craniotomy and hematoma evacuation. ?-Being managed by neurosurgery ?-On prophylactic Keppra ?

## 2021-12-22 NOTE — Evaluation (Signed)
Speech Language Pathology Evaluation ?Patient Details ?Name: Kayla Gray ?MRN: [id number] ?DOB: [birth year] ?Today's Date: 12/22/2021 ?Time: 5009-3818 ?SLP Time Calculation (min) (ACUTE ONLY): 30 min ? ?Problem List:  ?Patient Active Problem List  ? Diagnosis Date Noted  ? Subdural hematoma 12/18/2021  ? Diverticulitis 05/27/2019  ? Unsteady gait 12/27/2018  ? Paroxysmal atrial fibrillation ([place]) 11/14/2015  ? Encounter for therapeutic drug monitoring 09/29/2015  ? Chronic diastolic (congestive) heart failure (HCC)   ? SOB (shortness of breath) 08/17/2015  ? Angina pectoris ([place]) 08/17/2015  ? Low back pain 08/12/2015  ? Sinus tachycardia 01/06/2014  ? Osteoarthritis of both knees 01/06/2014  ? Aortic valve stenosis 11/27/2012  ? SVT (supraventricular tachycardia) ([place]) 11/27/2012  ? Syncope 11/27/2012  ? ?Past Medical History:  ?Past Medical History:  ?Diagnosis Date  ? Aortic stenosis   ? Bilateral cataracts   ? Bleeding nose   ? Thurs night (09/08/15) and Fri (09/09/15) left side.Bleeding didn't last long  ? Breast cancer ([place]) 1999  ? right breast lumpectomy and rad tx  ? Chronic diastolic (congestive) heart failure (HCC)   ? Depression   ? Diverticulosis   ? Gastroesophageal reflux disease   ? Heart murmur   ? Hyperlipidemia   ? Hypertension   ? Iron deficiency anemia   ? Obstructive sleep apnea   ? Osteoarthritis   ? Osteopenia   ? Peptic ulcer disease   ? Personal history of radiation therapy   ? S/P aortic valve replacement with bioprosthetic valve 09/16/2015  ? 23 mm Edwards Intuity Elite bioprosthetic tissue valve  ? Skin cancer 2020  ? Stroke [facility])   ? 11/14/13  ? Supraventricular tachycardia ([place])   ? Syncope and collapse   ? Urinary tract infection   ? ?Past Surgical History:  ?Past Surgical History:  ?Procedure Laterality Date  ? AORTIC VALVE REPLACEMENT N/A 09/16/2015  ? Procedure: AORTIC VALVE REPLACEMENT (AVR);  Surgeon: Rexene Alberts, MD;  Location: [facility];  Service: Open Heart Surgery;  Laterality:  N/A;  ? BREAST BIOPSY Right 1999  ? breast ca radation  ? BREAST EXCISIONAL BIOPSY Left yrs ago   ? benign  ? BREAST LUMPECTOMY Right 1999  ? with radiation  ? CARDIAC CATHETERIZATION N/A 08/17/2015  ? Procedure: Left Heart Cath and Coronary Angiography;  Surgeon: Minna Merritts, MD;  Location: [facility];  Service: Cardiovascular;  Laterality: N/A;  ? CATARACT EXTRACTION, BILATERAL    ? CHOLECYSTECTOMY    ? CRANIOTOMY Right 12/18/2021  ? Procedure: CRANIOTOMY HEMATOMA EVACUATION SUBDURAL;  Surgeon: Meade Maw, MD;  Location: [facility];  Service: Neurosurgery;  Laterality: Right;  ? JOINT REPLACEMENT Bilateral   ? knee replacement  ? KNEE ARTHROSCOPY    ? right  ? KNEE ARTHROSCOPY    ? left  ? SKIN CANCER EXCISION  2020  ? TEE WITHOUT CARDIOVERSION N/A 09/16/2015  ? Procedure: TRANSESOPHAGEAL ECHOCARDIOGRAM (TEE);  Surgeon: Rexene Alberts, MD;  Location: [place];  Service: Open Heart Surgery;  Laterality: N/A;  ? TONSILLECTOMY    ? VAGINAL HYSTERECTOMY    ? ?HPI:  ?Kayla Gray is a 86 y.o. female with medical history significant of AS s/p bovine aortic valve replacement, cancer status post lumpectomy and radiation therapy, chronic diastolic CHF, HTN, paroxysmal SVT, who came to the hospital after 1 syncope episode that resulted in fall with large subdural hematoma with midline shift requiring right-sided craniotomy by neurosurgery.  ? ?Assessment / Plan / Recommendation ?Clinical Impression ?  Pt presents with mild to moderate cognitive impairment c/b delayed processing, decreased selective attention, moderate memory impairment especially recalling new information, insight, emergent awareness and  higher level cognition is impaired including executive function, reasoning, sequencing, organizing and task initiation. As such, she takes more than a reasonable amount of time to perform/complete basic tasks such as open containers. This evaluation was completed while pt had lunch tray in front of her,  so basic tasks were evaluated but pt would definitely benefit from a comprehensive cognition evaluation at the next venue of care targeting medication and money management, recall of safety precautions. During this evaluation, pt's spoke of numerous falls resulting in concussions and hx of stroke. Pt's husband voices that he loves his wife dearly but at 58 years old, he is limited as to how much assistance he can provide at home. Given the nature of pt's current deficits and husband's limitation, recommend short-term rehab at this time.      ? ? ?Of note, an order was also placed for a Bedside Swallow Evaluation as pt had difficulty swallowing her medicine with thin liquids. Despite varying reports from pt, it appears that she consumes her medicine whole with applesauce at baseline. No overt s/s of aspiration/dysphagia were observed during lunch. Education provided to pt's nurse. ?   ?SLP Assessment ? SLP Recommendation/Assessment: All further Speech Lanaguage Pathology  needs can be addressed in the next venue of care ?SLP Visit Diagnosis: Cognitive communication deficit (R41.841);Frontal lobe and executive function deficit;Attention and concentration deficit  ?  ?Recommendations for follow up therapy are one component of a multi-disciplinary discharge planning process, led by the attending physician.  Recommendations may be updated based on patient status, additional functional criteria and insurance authorization. ?   ?Follow Up Recommendations ? Skilled nursing-short term rehab (<3 hours/day)  ?  ?Assistance Recommended at Discharge ? Frequent or constant Supervision/Assistance  ?Functional Status Assessment Patient has had a recent decline in their functional status and demonstrates the ability to make significant improvements in function in a reasonable and predictable amount of time.  ?Frequency and Duration    ? N/A ?  ?   ?SLP Evaluation ?Cognition ? Overall Cognitive Status: Impaired/Different from  baseline ?Arousal/Alertness: Awake/alert ?Orientation Level: Oriented to person;Oriented to place ?Year: 2022 ?Month: March ?Day of Week: Incorrect ?Attention: Selective ?Selective Attention: Impaired ?Selective Attention Impairment: Verbal basic;Functional basic ?Memory: Impaired ?Memory Impairment: Retrieval deficit;Decreased recall of new information;Decreased short term memory ?Decreased Short Term Memory: Verbal basic;Functional basic ?Awareness: Impaired ?Awareness Impairment: Intellectual impairment;Emergent impairment ?Problem Solving: Impaired ?Problem Solving Impairment: Verbal complex;Functional complex ?Executive Function:  (all impaired by lower level deficits) ?Behaviors: Poor frustration tolerance ?Safety/Judgment: Impaired ?[place] Scales of Cognitive Functioning: Confused/appropriate  ?  ?   ?Comprehension ? Auditory Comprehension ?Overall Auditory Comprehension: Appears within functional limits for tasks assessed ?Visual Recognition/Discrimination ?Discrimination: Not tested ?Reading Comprehension ?Reading Status: Not tested  ?  ?Expression Expression ?Primary Mode of Expression: Verbal ?Verbal Expression ?Overall Verbal Expression: Appears within functional limits for tasks assessed ?Written Expression ?Dominant Hand: Right ?Written Expression: Not tested   ?Oral / Motor ? Oral Motor/Sensory Function ?Overall Oral Motor/Sensory Function: Within functional limits ?Motor Speech ?Overall Motor Speech: Appears within functional limits for tasks assessed   ?        ?Maresha Anastos B. Rutherford Nail, M.S., CCC-SLP, CBIS ?Speech-Language Pathologist ?Rehabilitation Services ?Office [phone] ? ?Lashawn Orrego ?12/22/2021, 4:08 PM ? ?

## 2021-12-22 NOTE — Progress Notes (Signed)
? ? Attending Progress Note ? ?History: Kayla Gray is s/p right craniotomy for SDH evacuation  ? ?POD4: NAEO. ? ?POD3: NAEO ? ?POD2: AMS yesterday. Head CT, MRI, and EEG without explanation. Improved with holding narcotics. Pt without complaints this morning ? ?POD1: Complaints of headache overnight. Had CT head showing improved shift. Pt has been drowsy since 4 am.  ? ?Physical Exam: ?Vitals:  ? 12/22/21 0441 12/22/21 0743  ?BP: (!) 161/77 (!) 155/72  ?Pulse: 68 64  ?Resp: 20 16  ?Temp: 98.1 ?F (36.7 ?C) 98.4 ?F (36.9 ?C)  ?SpO2: 99% 99%  ? ? ?Sleeping but arouses to voice.  ?Oriented to person and year.  ?No pronator drift.  ?Follows commands x 4. ? ?Data: ? ?Recent Labs  ?Lab 12/18/21 ?1533 12/19/21 ?0706 12/20/21 ?0725  ?NA 138 139 140  ?K 3.8 3.8 3.6  ?CL 106 103 106  ?CO2 '27 24 27  '$ ?BUN 18 15 24*  ?CREATININE 0.91 0.78 0.95  ?GLUCOSE 120* 240* 112*  ?CALCIUM 8.9 8.6* 8.6*  ? ? ?Recent Labs  ?Lab 12/19/21 ?0706  ?AST 61*  ?ALT 37  ?ALKPHOS 50  ? ?  ? Recent Labs  ?Lab 12/18/21 ?1533 12/19/21 ?1610 12/20/21 ?0725  ?WBC 8.9 9.6 9.7  ?HGB 11.8* 10.7* 9.5*  ?HCT 37.5 33.3* 30.5*  ?PLT 155 151 124*  ? ? ?Recent Labs  ?Lab 12/18/21 ?1533  ?INR 1.1  ? ?  ?   ? ? ?Other tests/results:  ?CT head 12/19/21 ?IMPRESSION: ?1. Postoperative changes from interval right sided craniotomy for ?subdural evacuation. Residual small volume subdural hemorrhage along ?the falx and right tentorium measuring up to 6 mm in maximal ?thickness. Improved mass effect with trace 2 mm right-to-left shift. ?No hydrocephalus or trapping. ?2. No other new acute intracranial abnormality. ?Electronically Signed ?  By: Jeannine Boga M.D. ?  On: 12/19/2021 00:50 ? ?MRI brain 12/19/21 ?IMPRESSION: ?1. Small region of acute ischemia in the right superior frontal ?gyrus near the vertex corresponding to the finding on the same-day ?head CT, and punctate acute infarct in the right insular cortex. ?2. Stable postsurgical changes reflecting right  frontoparietal ?craniotomy for subdural hematoma evacuation. Residual subdural blood ?over the right cerebral convexity, layering along the falx ?posteriorly, and right tentorial leaflet is overall similar to the ?same-day head CT, with minimal mass effect and no measurable midline ?shift. ?3. Subdural collections are also seen overlying the bilateral ?cerebellar hemispheres extending to the craniocervical junction. ?Please also see the separately dictated cervical spine MRI. ?  ?Electronically Signed ?  By: Valetta Mole M.D. ?  On: 12/19/2021 15:08 ? ?MRI C-spine 12/19/21 ?IMPRESSION: ?Extramedullary hemorrhage within the upper cervical spinal canal ?(extending from the craniocervical junction to the C5 level). ?Resultant mild-to-moderate narrowing of the upper cervical spinal ?canal (without spinal cord mass effect). ?  ?Cervical spondylosis, as outlined and having progressed at multiple ?levels since the prior MRI of 05/01/2017. ?  ?At C6-C7, there is progressive moderate/advanced disc degeneration ?with mild degenerative endplate edema. A posterior disc osteophyte ?complex contributes to progressive moderate spinal canal stenosis. ?Multifactorial severe bilateral neural foraminal narrowing also ?present at this level. ?  ?No more than mild degenerative spinal canal narrowing at the ?remaining levels. ?  ?Multifactorial moderate left neural foraminal narrowing at C5-C6. ?  ?  ?Electronically Signed ?  By: [name] Simmering D.O. ?  On: 12/19/2021 15:23 ? ?EEG 12/19/21 ?IMPRESSION: ?This study is suggestive of cortical dysfunction arising from right hemisphere, maximal right  frontal region consistent with underlying craniotomy and SDH. Additionally there is moderate diffuse encephalopathy, nonspecific etiology. No seizures were seen throughout the recording. ?  ?[name]  ? ?Assessment/Plan: ? ?Kayla Gray is a 86 y.o presenting after a fall with a right sided SDH with midline shift and left sided weakness s/p  right craniotomy for evacuation ? ?- Started ppx Lovenox  ?- hold all sedative medications ?- continue PT/OT. Considering IPR ?- syncope workup underway- appreciate medicines assistance.   ?- please page neurosurgery with any questions or concerns ? ?Cooper Render PA-C ?Department of Neurosurgery ? ?  ?

## 2021-12-22 NOTE — Assessment & Plan Note (Signed)
Patient denies any prodromal symptoms.  Patient has an history of paroxysmal atrial fibrillation and paroxysmal SVT in the past, there was a concern of cardiac syncope.  Telemetry remains inconclusive. ?-Patient might get benefit from an outpatient Zio patch. ?-Cardiology consult should be considered ?

## 2021-12-22 NOTE — Progress Notes (Signed)
?Progress Note ? ? ?Patient: Kayla Gray WEX:[id number] DOB: [birth year] DOA: 12/18/2021     4 ?DOS: the patient was seen and examined on 12/22/2021 ?  ?Brief hospital course: ?Taken from initial consult note. ? ?ALONA DANFORD is a 86 y.o. female with medical history significant of AS s/p bovine aortic valve replacement, cancer status post lumpectomy and radiation therapy, chronic diastolic CHF, HTN, paroxysmal SVT, who came to the hospital after 1 syncope episode. ?During that episode patient fell and hit her head.  No prodromal symptoms. ?Fall resulted in a large subdural hematoma with midline shift requiring right-sided craniotomy by neurosurgery.  Patient was admitted under neuro surgery service. ?We will ask for syncopal work-up. ? ?Syncope work-up so far remains inconclusive, telemetry with normal sinus rhythm, 2 very small episodes of SVT, lasting for less than 10 seconds and patient remained asymptomatic.  A1c of 5.7.  Patient does not have any history of diabetes or prediabetes.  No recent illnesses. ? ?Patient has an history of severe aortic stenosis s/p bioprosthetic aortic valve replacement about 3 years ago.  Repeat echocardiogram done today was with normal EF, grade 1 diastolic dysfunction, no regional wall abnormalities and properly functioning bioprosthetic valve without any abnormality noted. ? ?Patient denies any chest pain or shortness of breath. ? ?She was little more somnolent yesterday-became more alert today after decreasing opioids. ? ? ?Assessment and Plan: ?* Subdural hematoma ?Patient developed large subdural hematoma with a midline shift after hitting her head during the syncopal episode s/p right craniotomy and hematoma evacuation. ?-Being managed by neurosurgery ?-On prophylactic Keppra ? ?Syncope ?Patient denies any prodromal symptoms.  Patient has an history of paroxysmal atrial fibrillation and paroxysmal SVT in the past, there was a concern of cardiac syncope.  Telemetry remains  inconclusive. ?-Patient might get benefit from an outpatient Zio patch. ?-Cardiology consult should be considered ? ?Chronic diastolic (congestive) heart failure (HCC) ?Appears euvolemic.  Repeat echocardiogram with no other significant abnormality. ?-Continue to monitor ? ?Paroxysmal atrial fibrillation (HCC) ?Remained in sinus rhythm. ?-Continue to monitor ? ? ?Subjective: Patient was seen and examined today.  Stating that she is not feeling good, but unable to explain why she is not feeling well.  Denies any pain or shortness of breath.  She was just feeling very fatigued. ? ?Physical Exam: ?Vitals:  ? 12/21/21 2339 12/22/21 0441 12/22/21 0743 12/22/21 1143  ?BP: (!) 144/66 (!) 161/77 (!) 155/72 (!) 133/92  ?Pulse: 71 68 64 75  ?Resp: '18 20 16 20  '$ ?Temp: 98 ?F (36.7 ?C) 98.1 ?F (36.7 ?C) 98.4 ?F (36.9 ?C) 98.3 ?F (36.8 ?C)  ?TempSrc: Oral Oral Oral Oral  ?SpO2: 100% 99% 99% 97%  ?Weight:      ?Height:      ? ?General.  Ill-appearing elderly lady in no acute distress.  Right craniotomy healing sutures. ?Pulmonary.  Lungs clear bilaterally, normal respiratory effort. ?CV.  Regular rate and rhythm, no JVD, rub or murmur. ?Abdomen.  Soft, nontender, nondistended, BS positive. ?CNS.  Alert and oriented .  No focal neurologic deficit. ?Extremities.  No edema, no cyanosis, pulses intact and symmetrical. ? ?Data Reviewed: ?I reviewed prior notes, labs and images. ? ?Family Communication: Primary team is communicating with family. ? ?Disposition: ?Status is: Inpatient ?Remains inpatient appropriate because: Severity of illness. ? ? ?Time spent: 50 minutes ? ?This record has been created using Systems analyst. Errors have been sought and corrected,but may not always be located. Such creation errors do not  reflect on the standard of care. ? ?Author: ?Lorella Nimrod, MD ?12/22/2021 2:23 PM ? ?For on call review [email or web address].  ?

## 2021-12-22 NOTE — [place] FL2 (Signed)
?[place] MEDICAID FL2 LEVEL OF CARE SCREENING TOOL  ?  ? ?IDENTIFICATION  ?Patient Name: ?BANEEN WIESELER Birthdate: 1936/02/07 Sex: female Admission Date (Current Location): ?12/18/2021  ?[place] and [place] Number: ? [place] ?  Facility and Address:  ?[facility], [address], [place], [place] [address] ?     Provider Number: ?[id number]  ?Attending Physician Name and Address:  ?Meade Maw, MD ? Relative Name and Phone Number:  ?  ?   ?Current Level of Care: ?Hospital Recommended Level of Care: ?[place] Prior Approval Number: ?  ? ?Date Approved/Denied: ?  PASRR Number: ?[id number] A ? ?Discharge Plan: ?SNF ?  ? ?Current Diagnoses: ?Patient Active Problem List  ? Diagnosis Date Noted  ? Subdural hematoma 12/18/2021  ? Diverticulitis 05/27/2019  ? Unsteady gait 12/27/2018  ? Paroxysmal atrial fibrillation ([place]) 11/14/2015  ? Encounter for therapeutic drug monitoring 09/29/2015  ? Chronic diastolic (congestive) heart failure (HCC)   ? SOB (shortness of breath) 08/17/2015  ? Angina pectoris ([place]) 08/17/2015  ? Low back pain 08/12/2015  ? Sinus tachycardia 01/06/2014  ? Osteoarthritis of both knees 01/06/2014  ? Aortic valve stenosis 11/27/2012  ? SVT (supraventricular tachycardia) ([place]) 11/27/2012  ? Syncope 11/27/2012  ? ? ?Orientation RESPIRATION BLADDER Height & Weight   ?  ?Self, Situation ? O2 Continent Weight: 169 lb 1.5 oz (76.7 kg) ?Height:  '5\' 2"'$  (157.5 cm)  ?BEHAVIORAL SYMPTOMS/MOOD NEUROLOGICAL BOWEL NUTRITION STATUS  ?    Continent Diet (soft diet thin liquids)  ?AMBULATORY STATUS COMMUNICATION OF NEEDS Skin   ?Limited Assist Verbally Other (Comment) (closed incision right head) ?  ?  ?  ?    ?     ?     ? ? ?Personal Care Assistance Level of Assistance  ?Bathing, Feeding, Dressing Bathing Assistance: Limited assistance ?Feeding assistance: Independent ?Dressing Assistance: Limited assistance ?   ? ?Functional Limitations Info  ?Sight, Hearing, Speech  Sight Info: Adequate ?Hearing Info: Adequate ?Speech Info: Adequate  ? ? ?SPECIAL CARE FACTORS FREQUENCY  ?PT (By licensed PT), OT (By licensed OT)   ?  ?PT Frequency: 5x ?OT Frequency: 5x ?  ?  ?  ?   ? ? ?Contractures Contractures Info: Not present  ? ? ?Additional Factors Info  ?Code Status, Allergies Code Status Info: full code ?Allergies Info: Evista (Raloxifene), Sulfa Antibiotics, Codeine, Nitrofurantoin, Oxycodone-acetaminophen, Statins ?  ?  ?  ?   ? ?Current Medications (12/22/2021):  This is the current hospital active medication list ?Current Facility-Administered Medications  ?Medication Dose Route Frequency Provider Last Rate Last Admin  ? acetaminophen (TYLENOL) tablet 650 mg  650 mg Oral Q4H PRN Loleta Dicker, PA   650 mg at 12/21/21 1618  ? Or  ? acetaminophen (TYLENOL) suppository 650 mg  650 mg Rectal Q4H PRN Loleta Dicker, PA      ? bisacodyl (DULCOLAX) suppository 10 mg  10 mg Rectal Daily PRN Loleta Dicker, PA      ? chlorhexidine (PERIDEX) 0.12 % solution 15 mL  15 mL Mouth Rinse BID Ottie Glazier, MD   15 mL at 12/22/21 1310  ? Chlorhexidine Gluconate Cloth 2 % PADS 6 each  6 each Topical Q0600 Meade Maw, MD   6 each at 12/22/21 0510  ? enoxaparin (LOVENOX) injection 40 mg  40 mg Subcutaneous Q24H Loleta Dicker, [place]   40 mg at 12/21/21 2038  ? ezetimibe (ZETIA) tablet 10 mg  10 mg Oral Daily Derrill Kay,  Luciano Cutter, PA   10 mg at 12/22/21 3154  ? insulin aspart (novoLOG) injection 0-9 Units  0-9 Units Subcutaneous TID WC Chappell, Alex B, RPH      ? labetalol (NORMODYNE) injection 10-40 mg  10-40 mg Intravenous Q10 min PRN Loleta Dicker, PA   20 mg at 12/20/21 1110  ? lactated ringers infusion   Intravenous Continuous Ottie Glazier, MD 40 mL/hr at 12/21/21 1758 Infusion Verify at 12/21/21 1758  ? levETIRAcetam (KEPPRA) IVPB 500 mg/100 mL premix  500 mg Intravenous Q12H Loleta Dicker, [place] 400 mL/hr at 12/22/21 0901 500 mg at 12/22/21 0901  ? MEDLINE mouth  rinse  15 mL Mouth Rinse q12n4p Ottie Glazier, MD   15 mL at 12/22/21 1200  ? metoprolol tartrate (LOPRESSOR) tablet 25 mg  25 mg Oral BID Loleta Dicker, [place]   25 mg at 12/22/21 0086  ? multivitamin with minerals tablet 1 tablet  1 tablet Oral Daily Loleta Dicker, [place]   1 tablet at 12/22/21 7619  ? naloxone [facility]) injection 0.08 mg  0.08 mg Intravenous PRN Loleta Dicker, PA      ? ondansetron [facility]) tablet 4 mg  4 mg Oral Q4H PRN Loleta Dicker, PA   4 mg at 12/18/21 2102  ? Or  ? ondansetron (ZOFRAN) injection 4 mg  4 mg Intravenous Q4H PRN Loleta Dicker, PA   4 mg at 12/20/21 5093  ? oxybutynin (DITROPAN-XL) 24 hr tablet 5 mg  5 mg Oral Daily Loleta Dicker, [place]   5 mg at 12/22/21 0840  ? [START ON 12/23/2021] pantoprazole (PROTONIX) EC tablet 40 mg  40 mg Oral Daily Benita Gutter, RPH      ? PARoxetine (PAXIL) tablet 20 mg  20 mg Oral Daily Loleta Dicker, [place]   20 mg at 12/22/21 2671  ? polyethylene glycol (MIRALAX / GLYCOLAX) packet 17 g  17 g Oral Daily PRN Loleta Dicker, PA      ? promethazine (PHENERGAN) 12.5 mg in sodium chloride 0.9 % 50 mL IVPB  12.5 mg Intravenous Q6H PRN Tukov-Yual, Arlyss Gandy, NP   Stopped at 12/19/21 1021  ? promethazine (PHENERGAN) tablet 12.5-25 mg  12.5-25 mg Oral Q4H PRN Loleta Dicker, PA      ? senna (SENOKOT) tablet 8.6 mg  1 tablet Oral BID Loleta Dicker, PA   8.6 mg at 12/22/21 2458  ? sodium phosphate (FLEET) 7-19 GM/118ML enema 1 enema  1 enema Rectal Once PRN Loleta Dicker, PA      ? ? ? ?Discharge Medications: ?Please see discharge summary for a list of discharge medications. ? ?Relevant Imaging Results: ? ?Relevant Lab Results: ? ? ?Additional Information ?[id number] ? ?Robertlee Rogacki A Tanaia Hawkey, LCSW ? ? ? ? ?

## 2021-12-22 NOTE — Assessment & Plan Note (Signed)
Appears euvolemic.  Repeat echocardiogram with no other significant abnormality. ?-Continue to monitor ?

## 2021-12-22 NOTE — Progress Notes (Signed)
PHARMACIST - PHYSICIAN COMMUNICATION ? ?CONCERNING: IV to Oral Route Change Policy ? ?RECOMMENDATION: ?This patient is receiving pantoprazole by the intravenous route.  Based on criteria approved by the Pharmacy and Therapeutics Committee, the intravenous medication(s) is/are being converted to the equivalent oral dose form(s). ? ? ?DESCRIPTION: ?These criteria include: ?The patient is eating (either orally or via tube) and/or has been taking other orally administered medications for a least 24 hours ?The patient has no evidence of active gastrointestinal bleeding or impaired GI absorption (gastrectomy, short bowel, patient on TNA or NPO). ? ?If you have questions about this conversion, please contact the Pharmacy Department  ? ?Benita Gutter, RPH ?12/22/2021 7:59 AM  ?

## 2021-12-22 NOTE — Progress Notes (Addendum)
Physical Therapy Treatment ?Patient Details ?Name: Kayla Gray ?MRN: [id number] ?DOB: 1935-12-15 ?Today's Date: 12/22/2021 ? ? ?History of Present Illness Pt is a 86 y.o. female who presents to the ED after falling and Pt report of "striking the back of her head". Admitted for Subdural hematoma s/p R craniotomy for evacuation of hematoma (3/6). PmHx: A-Fib, History of Cancer, CHF, Depression, HTN, HLD, OA, GERD. ? ?  ?PT Comments  ? ? Pt awake and alert sitting in recliner w/ family at bedside upon PT entrance into room for today's session. Pt is A&Ox4, but requires increased time for processing month/year. Pt is able to perform sit to stand from recliner w/ minA using RW; verbal cues provided for UE placement for optimal utilization and safety. Pt was able to take 4 steps forwards/backwards and return to recliner prior to continuation w/ ambulation. After a brief rest break, she was able to ambulate ~59f w/ CGA-minA using RW. She denied any c/o pain throughout, but noted increased fatigue following exercises. Once seated back in recliner Pt was able to perform LE exercises (see "General LE Exercises" section). Pt will benefit from continued skilled PT in order to increase LE strength, improve mobility/gait, and restore PLOF. Current discharge recommendation remains appropriate due to the level of assistance required by the patient to ensure safety and improve overall function. ?  ?Recommendations for follow up therapy are one component of a multi-disciplinary discharge planning process, led by the attending physician.  Recommendations may be updated based on patient status, additional functional criteria and insurance authorization. ? ?Follow Up Recommendations ? Acute inpatient rehab (3hours/day) ?  ?  ?Assistance Recommended at Discharge Frequent or constant Supervision/Assistance  ?Patient can return home with the following A lot of help with walking and/or transfers;A lot of help with  bathing/dressing/bathroom;Assistance with cooking/housework;Assist for transportation;Help with stairs or ramp for entrance ?  ?Equipment Recommendations ? Rolling walker (2 wheels)  ?  ?Recommendations for Other Services   ? ? ?  ?Precautions / Restrictions Precautions ?Precautions: Fall ?Restrictions ?Weight Bearing Restrictions: No  ?  ? ?Mobility ? Bed Mobility ?  ?  ?  ?  ?  ?  ?  ?General bed mobility comments: Pt received sitting in recliner ?  ? ?Transfers ?Overall transfer level: Needs assistance ?Equipment used: Rolling walker (2 wheels) ?Transfers: Sit to/from Stand ?Sit to Stand: Min assist ?  ?  ?  ?  ?  ?  ?  ? ?Ambulation/Gait ?Ambulation/Gait assistance: Min guard, MinA ?Gait Distance (Feet): 20 Feet ?Assistive device: Rolling walker (2 wheels) ?Gait Pattern/deviations: Step-to pattern, Decreased step length - right, Decreased step length - left, Decreased stride length ?Gait velocity: decreased ?  ?  ?  ? ? ?Stairs ?  ?  ?  ?  ?  ? ? ?Wheelchair Mobility ?  ? ?Modified Rankin (Stroke Patients Only) ?  ? ? ?  ?Balance Overall balance assessment: Needs assistance ?Sitting-balance support: Feet supported, No upper extremity supported ?Sitting balance-Leahy Scale: Good ?  ?  ?Standing balance support: Bilateral upper extremity supported, During functional activity, Reliant on assistive device for balance ?Standing balance-Leahy Scale: Fair ?  ?  ?  ?  ?  ?  ?  ?  ?  ?  ?  ?  ?  ? ?  ?Cognition Arousal/Alertness: Awake/alert ?Behavior During Therapy: Flat affect ?Overall Cognitive Status: Impaired/Different from baseline ?Area of Impairment: Memory, Following commands, Problem solving ?  ?  ?  ?  ?  ?  ?  ?  ?  Orientation Level: Person, Place, Time, Situation ?  ?  ?Following Commands: Follows one step commands with increased time ?  ?  ?  ?General Comments: Pt requires MOD cues for safety with mobility. no cues for sequencing ADLs. ?  ?  ? ?  ?Exercises General Exercises - Lower Extremity ?Long Arc Quad:  AROM, Both, 10 reps ?Hip ABduction/ADduction: AROM, Both, 10 reps ?Straight Leg Raises: AROM, Both, 10 reps ? ?  ?General Comments   ?  ?  ? ?Pertinent Vitals/Pain Pain Assessment ?Pain Assessment: No/denies pain  ? ? ?Home Living   ?  ?  ?  ?  ?  ?  ?  ?  ?  ?   ?  ?Prior Function    ?  ?  ?   ? ?PT Goals (current goals can now be found in the care plan section) Progress towards PT goals: Progressing toward goals ? ?  ?Frequency ? ? ? 7X/week ? ? ? ?  ?PT Plan Current plan remains appropriate  ? ? ?Co-evaluation   ?  ?  ?  ?  ? ?  ?AM-PAC PT "6 Clicks" Mobility   ?Outcome Measure ? Help needed turning from your back to your side while in a flat bed without using bedrails?: A Little ?Help needed moving from lying on your back to sitting on the side of a flat bed without using bedrails?: A Little ?Help needed moving to and from a bed to a chair (including a wheelchair)?: A Little ?Help needed standing up from a chair using your arms (e.g., wheelchair or bedside chair)?: A Little ?Help needed to walk in hospital room?: A Lot ?Help needed climbing 3-5 steps with a railing? : A Lot ?6 Click Score: 16 ? ?  ?End of Session Equipment Utilized During Treatment: Gait belt ?Activity Tolerance: Patient tolerated treatment well;Patient limited by fatigue ?Patient left: in chair;with chair alarm set;with family/visitor present;with call bell/phone within reach ?Nurse Communication: Mobility status ?PT Visit Diagnosis: Unsteadiness on feet (R26.81);History of falling (Z91.81);Muscle weakness (generalized) (M62.81) ?  ? ? ?Time: 0998-3382 ?PT Time Calculation (min) (ACUTE ONLY): 30 min ? ?Charges:             ?          ? ? ?Jonnie Kind, SPT ?12/22/2021, 1:04 PM ? ?

## 2021-12-22 NOTE — Progress Notes (Signed)
*  PRELIMINARY RESULTS* ?Echocardiogram ?2D Echocardiogram has been performed. ? ?Kayla Gray Petro ?12/22/2021, 10:58 AM ?

## 2021-12-22 NOTE — Progress Notes (Signed)
Inpatient Rehab Admissions Coordinator:  ? ?Spoke to pt's daughter, [name], over the phone to discuss CIR.  Main concern for family at this point is what kind of support pt will require at end of short term rehab at [facility] and whether or not pt's spouse can reliably provide that level of assist, versus other family members stepping in.  SLP to evaluate for cognition to hopefully provide some insight onto expected goals from CIR.  We discussed basic differences between CIR versus SNF and Kelli would like to speak to LCSW about what SNF would look like in more detail.  She also asked for update from MD on pt's current status and what to expect recovery-wise.  I passed along to both.  She did agree for me to start insurance prior auth, in case decision was made that family could support pt following a dc from CIR.  We reviewed that if CIR were approved that subsequent admission to SNF were unlikely to be approved.  She verbalized understanding and is hopeful that with more information from LCSW and medical team they will be able to determine best venue for pt's rehab.  I will f/u on Monday.   ? ?Shann Medal, PT, DPT ?Admissions Coordinator ?[phone] ?12/22/21  ?2:23 PM ? ?

## 2021-12-22 NOTE — Hospital Course (Addendum)
Taken from initial consult note. ? ?Kayla Gray is a 86 y.o. female with medical history significant of AS s/p bovine aortic valve replacement, cancer status post lumpectomy and radiation therapy, chronic diastolic CHF, HTN, paroxysmal SVT, who came to the hospital after 1 syncope episode. ?During that episode patient fell and hit her head.  No prodromal symptoms. ?Fall resulted in a large subdural hematoma with midline shift requiring right-sided craniotomy by neurosurgery.  Patient was admitted under neuro surgery service. ?We will ask for syncopal work-up. ? ?Syncope work-up so far remains inconclusive, telemetry with normal sinus rhythm, 2 very small episodes of SVT, lasting for less than 10 seconds and patient remained asymptomatic.  A1c of 5.7.  Patient does not have any history of diabetes or prediabetes.  No recent illnesses. ? ?Patient has an history of severe aortic stenosis s/p bioprosthetic aortic valve replacement about 3 years ago.  Repeat echocardiogram done today was with normal EF, grade 1 diastolic dysfunction, no regional wall abnormalities and properly functioning bioprosthetic valve without any abnormality noted. ? ?Patient denies any chest pain or shortness of breath. ? ?She was little more somnolent yesterday-became more alert today after decreasing opioids. ? ?3/11: Patient seems to be at her baseline now.  Syncope work-up so far negative.  Patient will need ZIO monitor on discharge, [facility] cardiology has to be notified on the day of discharge. ?Today husband and patient are telling me different story which was not consistent with syncopal episode.  Per patient and husband she was mopping and somehow lost her balance, may be slipped on a wet floor, per patient she never lost her consciousness, able to get herself up and walk to the recliner and then called her husband who was out of the house at that time.  When husband saw her she appears confused so they called the EMS. ?This is more  consistent with mechanical fall. ? ?3/12: Family decided to proceed with CIR, pending insurance authorization. ?Losartan was added for better control of hypertension as blood pressure remained elevated. ? ?3/13: Patient remained stable and is being discharged to inpatient rehab. ?Primary team contacted her cardiologist for a ZIO monitor. ?Rest of the management as advised by primary team. ? ?

## 2021-12-22 NOTE — TOC Progression Note (Signed)
Transition of Care (TOC) - Progression Note  ? ? ?Patient Details  ?Name: Kayla Gray ?MRN: [id number] ?Date of Birth: [birth year] ? ?Transition of Care (TOC) CM/SW Contact  ?Tryton Bodi A Athan Casalino, LCSW ?Phone Number: ?12/22/2021, 3:51 PM ? ?Clinical Narrative: CSW spoke with pt's daughter and they are unsure if pt will go to CIR or not. Pt's daughter is just unsure of dc plan after CIR if pt is still needing assistance. CSW explained the difference in SNF and CIR. Pt's daughter is agreeable to a f/o just to know the options. CSW will send referral.   ? ? ? ?Expected Discharge Plan: [place] ?Barriers to Discharge: Continued Medical Work up ? ?Expected Discharge Plan and Services ?Expected Discharge Plan: [place] ?  ?Discharge Planning Services: CM Consult ?Post Acute Care Choice: IP Rehab ?Living arrangements for the past 2 months: [place] ?                ?DME Arranged: N/A ?DME Agency: NA ?  ?  ?  ?  ?  ?  ?  ?  ? ? ?Social Determinants of Health (SDOH) Interventions ?  ? ?Readmission Risk Interventions ?Readmission Risk Prevention Plan 12/20/2021  ?Transportation Screening Complete  ?PCP or Specialist Appt within 5-7 Days Complete  ?Home Care Screening Complete  ?Medication Review (RN CM) Complete  ?Some recent data might be hidden  ? ? ?

## 2021-12-23 LAB — GLUCOSE, CAPILLARY
Glucose-Capillary: 105 mg/dL — ABNORMAL HIGH (ref 70–99)
Glucose-Capillary: 106 mg/dL — ABNORMAL HIGH (ref 70–99)
Glucose-Capillary: 110 mg/dL — ABNORMAL HIGH (ref 70–99)
Glucose-Capillary: 163 mg/dL — ABNORMAL HIGH (ref 70–99)
Glucose-Capillary: 71 mg/dL (ref 70–99)
Glucose-Capillary: 93 mg/dL (ref 70–99)

## 2021-12-23 NOTE — Progress Notes (Signed)
Occupational Therapy Treatment Patient Details Name: Kayla Gray MRN: [id number] DOB: Apr 30, 1936 Today's Date: 12/23/2021   History of present illness Pt is a 86 y.o. female who presents to the ED after falling and Pt report of "striking the back of her head". Admitted for Subdural hematoma s/p R craniotomy for evacuation of hematoma (3/6). PmHx: A-Fib, History of Cancer, CHF, Depression, HTN, HLD, OA, GERD.   OT comments  Kayla Gray was seen for OT tx session this date. Pt presents with supportive spouse at bedside. Pt endorses desire to use commode and agreeable to attempting transfer to room bathroom rather than BSC near chair. Pt is motivated to participate t/o session and engages in functional activity with assist as described below (See ADL section for additional detail regarding occupational performance). After ADL management pt returns to recliner and agreeable to participation in med task activity. Pt requires increased time/cueing for processing written instructions of fake pill bottles. She requires Supervision to MAX A to open pill bottles with assist variable depending on bottle type. Pt noted with difficulty determining where to put AM vs. PM medications in pill box, and requires consistent cueing t/o session to appropriately dispense fake medications (beads) according to bottle instructions. Pt able to correct mistakes with cueing. She return verbalizes understanding of safety education and cueing t/o session with good recall demonstrated from past sessions.   Pt making good progress toward goals. Pt continues to benefit from skilled OT services to maximize return to PLOF and minimize risk of future falls, injury, caregiver burden, and readmission. Will continue to follow POC as written. Discharge recommendation for acute inpatient rehabilitation remains appropriate.    Recommendations for follow up therapy are one component of a multi-disciplinary discharge planning process, led by  the attending physician.  Recommendations may be updated based on patient status, additional functional criteria and insurance authorization.    Follow Up Recommendations  Acute inpatient rehab (3hours/day)    Assistance Recommended at Discharge Frequent or constant Supervision/Assistance  Patient can return home with the following  Assistance with cooking/housework;Help with stairs or ramp for entrance;A little help with walking and/or transfers;A little help with bathing/dressing/bathroom;Direct supervision/assist for medications management;Assist for transportation   Equipment Recommendations   (defer to next venue of care.)    Recommendations for Other Services      Precautions / Restrictions Precautions Precautions: Fall Restrictions Weight Bearing Restrictions: No       Mobility Bed Mobility Overal bed mobility: Needs Assistance             General bed mobility comments: Pt received sitting in recliner    Transfers Overall transfer level: Needs assistance Equipment used: Rolling walker (2 wheels) Transfers: Sit to/from Stand Sit to Stand: Min assist (extra time, cuing to power up)                 Balance Overall balance assessment: Needs assistance Sitting-balance support: Feet supported, No upper extremity supported Sitting balance-Leahy Scale: Good Sitting balance - Comments: Steady static sitting, reaching within BOS.   Standing balance support: No upper extremity supported, During functional activity, Single extremity supported Standing balance-Leahy Scale: Poor Standing balance comment: Requires increased assist with standing balance without UE support on RW.                           ADL either performed or assessed with clinical judgement   ADL Overall ADL's : Needs assistance/impaired  Toilet Transfer: Minimal assistance;Min guard;BSC/3in1;Rolling walker (2 wheels) Toilet Transfer Details  (indicate cue type and reason): Close SBA to MIN A during STS during transfer to [facility] placed over room toilet. Pt rquires significant increased time/effort to perform functional mobility during session. Moderate multimodal cueing provided t/o session to alert to obstacles in room/safety during session. Toileting- Water quality scientist and Hygiene: Min guard;Sit to/from stand;Cueing for safety;With adaptive equipment Toileting - Clothing Manipulation Details (indicate cue type and reason): Close SBA for management of mesh underwear and peri-care after using commode. Cueing for safety during task. Close SBA to MIN A for standing hand washing at sink.       General ADL Comments: Anticipate increased assist required without use of RW (pt baseline) during functional tasks.    Extremity/Trunk Assessment              Vision Baseline Vision/History: 1 Wears glasses Ability to See in Adequate Light: 1 Impaired     Perception     Praxis      Cognition Arousal/Alertness: Awake/alert Behavior During Therapy: WFL for tasks assessed/performed, Flat affect (Mildly flat, but talkative and eager to discuss AIR during session.) Overall Cognitive Status: Impaired/Different from baseline                       Memory: Decreased short-term memory Following Commands: Follows one step commands with increased time Safety/Judgement: Decreased awareness of safety, Decreased awareness of deficits   Problem Solving: Slow processing, Decreased initiation, Difficulty sequencing, Requires verbal cues General Comments: Pt requires MOD cues for safety with mobility. MOD cueing for safety with med task activity with increased assist required to follow multi-step directions on pill bottles. MAX assist to open some pill bottles this date.         Exercises Other Exercises Other Exercises: OT facilitated ADL tasks as described above (see ADL section for additional detail), and med task activity during  session. Pt requires increased time/cueing t/o session for sequencing and processing VCs. Moderate cueing required t/o med task activity in addition to MOD A to open pill bottles t/o.    Shoulder Instructions       General Comments      Pertinent Vitals/ Pain       Pain Assessment Pain Assessment: No/denies pain  Home Living                                          Prior Functioning/Environment              Frequency  Min 4X/week        Progress Toward Goals  OT Goals(current goals can now be found in the care plan section)  Progress towards OT goals: Progressing toward goals  Acute Rehab OT Goals Patient Stated Goal: to get my independence back. OT Goal Formulation: With patient Time For Goal Achievement: 01/03/22 Potential to Achieve Goals: Good  Plan Discharge plan remains appropriate;Frequency remains appropriate    Co-evaluation                 AM-PAC OT "6 Clicks" Daily Activity     Outcome Measure   Help from another person eating meals?: A Little Help from another person taking care of personal grooming?: A Little Help from another person toileting, which includes using toliet, bedpan, or urinal?: A Little Help from another person bathing (including washing, rinsing,  drying)?: A Lot Help from another person to put on and taking off regular upper body clothing?: A Little Help from another person to put on and taking off regular lower body clothing?: A Lot 6 Click Score: 16    End of Session Equipment Utilized During Treatment: Rolling walker (2 wheels)  OT Visit Diagnosis: Unsteadiness on feet (R26.81);Other symptoms and signs involving the nervous system (R29.898);History of falling (Z91.81)   Activity Tolerance Patient tolerated treatment well   Patient Left in bed;with call bell/phone within reach;with bed alarm set;with family/visitor present;with nursing/sitter in room   Nurse Communication          Time:  2671-2458 OT Time Calculation (min): 39 min  Charges: OT General Charges $OT Visit: 1 Visit OT Treatments $Self Care/Home Management : 8-22 mins $Therapeutic Activity: 23-37 mins  Shara Blazing, M.S., OTR/L [facility] - [facility] Ascom: [phone] 12/23/21, 3:46 PM

## 2021-12-23 NOTE — Progress Notes (Signed)
Attending Progress Note ? ?History: Kayla Gray is s/p right craniotomy for SDH evacuation  ? ?24-hour events: Kayla Gray is now postop day 5 from her subdural evacuation.  She appears stable overall.  She is having no new complaints. ? ?POD4: NAEO. ?POD3: NAEO ?POD2: AMS yesterday. Head CT, MRI, and EEG without explanation. Improved with holding narcotics. Pt without complaints this morning ?POD1: Complaints of headache overnight. Had CT head showing improved shift. Pt has been drowsy since 4 am.  ? ?Physical Exam: ?Vitals:  ? 12/23/21 0450 12/23/21 0733  ?BP: (!) 146/72 136/65  ?Pulse: 74 68  ?Resp: 16 17  ?Temp: 99.4 ?F (37.4 ?C) 98.9 ?F (37.2 ?C)  ?SpO2: 99% 99%  ? ? ?Awake, cooperative ?Oriented to person and year.  ?Names and repeats ?Strength appears 5 out of 5 throughout upper and lower extremities ?Incision is well approximated ? ?Data: ? ?Recent Labs  ?Lab 12/18/21 ?1533 12/19/21 ?0706 12/20/21 ?0725  ?NA 138 139 140  ?K 3.8 3.8 3.6  ?CL 106 103 106  ?CO2 '27 24 27  '$ ?BUN 18 15 24*  ?CREATININE 0.91 0.78 0.95  ?GLUCOSE 120* 240* 112*  ?CALCIUM 8.9 8.6* 8.6*  ? ?Recent Labs  ?Lab 12/19/21 ?0706  ?AST 61*  ?ALT 37  ?ALKPHOS 50  ?  ? Recent Labs  ?Lab 12/18/21 ?1533 12/19/21 ?3007 12/20/21 ?0725  ?WBC 8.9 9.6 9.7  ?HGB 11.8* 10.7* 9.5*  ?HCT 37.5 33.3* 30.5*  ?PLT 155 151 124*  ? ?Recent Labs  ?Lab 12/18/21 ?1533  ?INR 1.1  ?  ?   ? ? ?Other tests/results:  ?CT head 12/19/21 ?IMPRESSION: ?1. Postoperative changes from interval right sided craniotomy for ?subdural evacuation. Residual small volume subdural hemorrhage along ?the falx and right tentorium measuring up to 6 mm in maximal ?thickness. Improved mass effect with trace 2 mm right-to-left shift. ?No hydrocephalus or trapping. ?2. No other new acute intracranial abnormality. ?Electronically Signed ?  By: Jeannine Boga M.D. ?  On: 12/19/2021 00:50 ? ?MRI brain 12/19/21 ?IMPRESSION: ?1. Small region of acute ischemia in the right superior  frontal ?gyrus near the vertex corresponding to the finding on the same-day ?head CT, and punctate acute infarct in the right insular cortex. ?2. Stable postsurgical changes reflecting right frontoparietal ?craniotomy for subdural hematoma evacuation. Residual subdural blood ?over the right cerebral convexity, layering along the falx ?posteriorly, and right tentorial leaflet is overall similar to the ?same-day head CT, with minimal mass effect and no measurable midline ?shift. ?3. Subdural collections are also seen overlying the bilateral ?cerebellar hemispheres extending to the craniocervical junction. ?Please also see the separately dictated cervical spine MRI. ?  ?Electronically Signed ?  By: Valetta Mole M.D. ?  On: 12/19/2021 15:08 ? ?MRI C-spine 12/19/21 ?IMPRESSION: ?Extramedullary hemorrhage within the upper cervical spinal canal ?(extending from the craniocervical junction to the C5 level). ?Resultant mild-to-moderate narrowing of the upper cervical spinal ?canal (without spinal cord mass effect). ?  ?Cervical spondylosis, as outlined and having progressed at multiple ?levels since the prior MRI of 05/01/2017. ?  ?At C6-C7, there is progressive moderate/advanced disc degeneration ?with mild degenerative endplate edema. A posterior disc osteophyte ?complex contributes to progressive moderate spinal canal stenosis. ?Multifactorial severe bilateral neural foraminal narrowing also ?present at this level. ?  ?No more than mild degenerative spinal canal narrowing at the ?remaining levels. ?  ?Multifactorial moderate left neural foraminal narrowing at C5-C6. ?  ?  ?Electronically Signed ?  By: [name] Simmering D.O. ?  On: 12/19/2021 15:23 ? ?EEG 12/19/21 ?IMPRESSION: ?This study is suggestive of cortical dysfunction arising from right hemisphere, maximal right frontal region consistent with underlying craniotomy and SDH. Additionally there is moderate diffuse encephalopathy, nonspecific etiology. No seizures were seen  throughout the recording. ?  ?Kayla Gray  ? ?Assessment/Plan: ? ?Kayla Gray is a 86 y.o presenting after a fall with a right sided SDH with midline shift and left sided weakness s/p right craniotomy for evacuation ? ?- Started ppx Lovenox  ?- hold all sedative medications ?- continue PT/OT. Considering IPR ?- Okay to shower ?- syncope workup underway-medicine team recommending monitor on discharge, order will need to be placed ?-Dispo pending placement ?- please page neurosurgery with any questions or concerns ? ?Deetta Perla, MD ?Department of Neurosurgery ? ?  ?

## 2021-12-23 NOTE — Progress Notes (Signed)
Physical Therapy Treatment ?Patient Details ?Name: Kayla Gray ?MRN: [id number] ?DOB: [birth year] ?Today's Date: 12/23/2021 ? ? ?History of Present Illness Pt is a 86 y.o. female who presents to the ED after falling and Pt report of "striking the back of her head". Admitted for Subdural hematoma s/p R craniotomy for evacuation of hematoma (3/6). PmHx: A-Fib, History of Cancer, CHF, Depression, HTN, HLD, OA, GERD. ? ?  ?PT Comments  ? ? Pt seen for PT tx in handoff from NT. Pt on room air throughout session with SPO2 >90%. Pt ambulates into hallway with RW & close supervision with significantly decreased gait speed & gait pattern as noted below. Pt requires frequent cuing to ambulate within base of AD. Pt ambulates in room without AD with min assist with even slower gait speed & more guarded gait pattern with further decreased step length BLE. Pt reports she was independent without AD at home & able to cook & clean. On this date, pt is able to brush teeth standing at sink but leans on counter with BUE for stability & requires assistance to open toothpaste. Pt also requires assistance for peri hygiene as pt did not fully clean herself after earlier BM. Pt also required max assist to thread mesh underwear on BLE despite pt reporting she could do this independently prior. Pt is quite far from her independent PLOF and would benefit from therapy that address physical mobility & cognition, as that affects her physically. Will continue to follow pt acutely to progress gait with LRAD, endurance & balance. ?   ?Recommendations for follow up therapy are one component of a multi-disciplinary discharge planning process, led by the attending physician.  Recommendations may be updated based on patient status, additional functional criteria and insurance authorization. ? ?Follow Up Recommendations ? Acute inpatient rehab (3hours/day) ?  ?  ?Assistance Recommended at Discharge Frequent or constant Supervision/Assistance  ?Patient  can return home with the following A little help with walking and/or transfers;A lot of help with bathing/dressing/bathroom;Assistance with cooking/housework;Assist for transportation;Direct supervision/assist for medications management;Help with stairs or ramp for entrance;Direct supervision/assist for financial management ?  ?Equipment Recommendations ? Rolling walker (2 wheels);BSC/3in1  ?  ?Recommendations for Other Services   ? ? ?  ?Precautions / Restrictions Precautions ?Precautions: Fall ?Restrictions ?Weight Bearing Restrictions: No  ?  ? ?Mobility ? Bed Mobility ?  ?  ?  ?  ?  ?  ?  ?  ?  ? ?Transfers ?Overall transfer level: Needs assistance ?  ?Transfers: Sit to/from Stand ?Sit to Stand: Min assist (extra time, cuing to power up) ?  ?  ?  ?  ?  ?  ?  ? ?Ambulation/Gait ?Ambulation/Gait assistance: Min assist, Supervision ?Gait Distance (Feet): 70 Feet (+ 15 ft) ?Assistive device: Rolling walker (2 wheels), None ?Gait Pattern/deviations: Decreased step length - right, Decreased step length - left, Decreased stride length ?Gait velocity: significantly decreased ?  ?  ?General Gait Details: decreased heel strike BLE, flexed posture, cuing to ambulate within base of AD ? ? ?Stairs ?  ?  ?  ?  ?  ? ? ?Wheelchair Mobility ?  ? ?Modified Rankin (Stroke Patients Only) ?  ? ? ?  ?Balance Overall balance assessment: Needs assistance ?Sitting-balance support: Feet supported, No upper extremity supported ?Sitting balance-Leahy Scale: Good ?  ?  ?Standing balance support: No upper extremity supported, During functional activity ?Standing balance-Leahy Scale: Poor ?  ?  ?  ?  ?  ?  ?  ?  ?  ?  ?  ?  ?  ? ?  ?  Cognition Arousal/Alertness: Awake/alert ?Behavior During Therapy: Flat affect ?Overall Cognitive Status: Impaired/Different from baseline ?  ?  ?  ?  ?  ?  ?  ?  ?  ?  ?  ?Memory: Decreased short-term memory ?Following Commands: Follows one step commands with increased time ?Safety/Judgement: Decreased awareness  of safety, Decreased awareness of deficits ?  ?  ?  ?  ?  ? ?  ?Exercises   ? ?  ?General Comments   ?  ?  ? ?Pertinent Vitals/Pain Pain Assessment ?Pain Assessment: No/denies pain  ? ? ?Home Living   ?  ?  ?  ?  ?  ?  ?  ?  ?  ?   ?  ?Prior Function    ?  ?  ?   ? ?PT Goals (current goals can now be found in the care plan section) Acute Rehab PT Goals ?Patient Stated Goal: none stated ?PT Goal Formulation: With patient ?Time For Goal Achievement: 01/03/22 ?Potential to Achieve Goals: Fair ?Progress towards PT goals: Progressing toward goals ? ?  ?Frequency ? ? ? 7X/week ? ? ? ?  ?PT Plan Current plan remains appropriate  ? ? ?Co-evaluation   ?  ?  ?  ?  ? ?  ?AM-PAC PT "6 Clicks" Mobility   ?Outcome Measure ? Help needed turning from your back to your side while in a flat bed without using bedrails?: None ?Help needed moving from lying on your back to sitting on the side of a flat bed without using bedrails?: A Little ?Help needed moving to and from a bed to a chair (including a wheelchair)?: A Little ?Help needed standing up from a chair using your arms (e.g., wheelchair or bedside chair)?: A Little ?Help needed to walk in hospital room?: A Little ?Help needed climbing 3-5 steps with a railing? : A Lot ?6 Click Score: 18 ? ?  ?End of Session Equipment Utilized During Treatment: Gait belt ?Activity Tolerance: Patient tolerated treatment well ?Patient left: in chair;with chair alarm set;with call bell/phone within reach;with family/visitor present ?Nurse Communication: Mobility status ?PT Visit Diagnosis: Unsteadiness on feet (R26.81);History of falling (Z91.81);Muscle weakness (generalized) (M62.81) ?  ? ? ?Time: 4290-3795 ?PT Time Calculation (min) (ACUTE ONLY): 26 min ? ?Charges:  $Therapeutic Activity: 23-37 mins          ?          ? ?Lavone Nian, PT, DPT ?12/23/21, 12:09 PM ? ? ? ?Waunita Schooner ?12/23/2021, 12:06 PM ? ?

## 2021-12-23 NOTE — Assessment & Plan Note (Signed)
Patient denies any prodromal symptoms.  Patient has an history of paroxysmal atrial fibrillation and paroxysmal SVT in the past, there was a concern of cardiac syncope.  Telemetry remains inconclusive. ?-After talking with cardiology yesterday they agreed to put a ZIO monitor on discharge. ?-After talking with patient and husband today does not look like a syncopal episode. ?

## 2021-12-23 NOTE — TOC Progression Note (Addendum)
Transition of Care (TOC) - Progression Note  ? ? ?Patient Details  ?Name: Kayla Gray ?MRN: [id number] ?Date of Birth: 22-Dec-1935 ? ?Transition of Care (TOC) CM/SW Contact  ?Jaquanda Wickersham E Toia Micale, LCSW ?Phone Number: ?12/23/2021, 1:49 PM ? ?Clinical Narrative:   Per PT that worked with patient today, patient would most benefit from CIR (versus SNF). ?Spoke with daughter to inform her of update from PT. Explained differences of CIR versus SNF. She stated she thinks they want CIR, she just wants to confirm with her mother one more time. She agreed to let CSW know if they change their minds or have any questions.  ? ?CSW reached out to Lauren in [employer] Admissions requesting update on insurance auth. Per Jacelyn Pi still pending.  ? ? ? ?Expected Discharge Plan: [place] ?Barriers to Discharge: Continued Medical Work up ? ?Expected Discharge Plan and Services ?Expected Discharge Plan: [place] ?  ?Discharge Planning Services: CM Consult ?Post Acute Care Choice: IP Rehab ?Living arrangements for the past 2 months: [place] ?                ?DME Arranged: N/A ?DME Agency: NA ?  ?  ?  ?  ?  ?  ?  ?  ? ? ?Social Determinants of Health (SDOH) Interventions ?  ? ?Readmission Risk Interventions ?Readmission Risk Prevention Plan 12/20/2021  ?Transportation Screening Complete  ?PCP or Specialist Appt within 5-7 Days Complete  ?Home Care Screening Complete  ?Medication Review (RN CM) Complete  ?Some recent data might be hidden  ? ? ?

## 2021-12-23 NOTE — Progress Notes (Signed)
?Progress Note ? ? ?Patient: Kayla Gray YIF:[id number] DOB: 07/15/1936 DOA: 12/18/2021     5 ?DOS: the patient was seen and examined on 12/23/2021 ?  ?Brief hospital course: ?Taken from initial consult note. ? ?Kayla Gray is a 86 y.o. female with medical history significant of AS s/p bovine aortic valve replacement, cancer status post lumpectomy and radiation therapy, chronic diastolic CHF, HTN, paroxysmal SVT, who came to the hospital after 1 syncope episode. ?During that episode patient fell and hit her head.  No prodromal symptoms. ?Fall resulted in a large subdural hematoma with midline shift requiring right-sided craniotomy by neurosurgery.  Patient was admitted under neuro surgery service. ?We will ask for syncopal work-up. ? ?Syncope work-up so far remains inconclusive, telemetry with normal sinus rhythm, 2 very small episodes of SVT, lasting for less than 10 seconds and patient remained asymptomatic.  A1c of 5.7.  Patient does not have any history of diabetes or prediabetes.  No recent illnesses. ? ?Patient has an history of severe aortic stenosis s/p bioprosthetic aortic valve replacement about 3 years ago.  Repeat echocardiogram done today was with normal EF, grade 1 diastolic dysfunction, no regional wall abnormalities and properly functioning bioprosthetic valve without any abnormality noted. ? ?Patient denies any chest pain or shortness of breath. ? ?She was little more somnolent yesterday-became more alert today after decreasing opioids. ? ?3/11: Patient seems to be at her baseline now.  Syncope work-up so far negative.  Patient will need ZIO monitor on discharge, [facility] cardiology has to be notified on the day of discharge. ?Today husband and patient are telling me different story which was not consistent with syncopal episode.  Per patient and husband she was mopping and somehow lost her balance, may be slipped on a wet floor, per patient she never lost her consciousness, able to get herself up  and walk to the recliner and then called her husband who was out of the house at that time.  When husband saw her she appears confused so they called the EMS. ?This is more consistent with mechanical fall. ? ? ?Assessment and Plan: ?* Subdural hematoma ?Patient developed large subdural hematoma with a midline shift after hitting her head during the syncopal episode s/p right craniotomy and hematoma evacuation. ?-Being managed by neurosurgery ?-On prophylactic Keppra ? ?Syncope ?Patient denies any prodromal symptoms.  Patient has an history of paroxysmal atrial fibrillation and paroxysmal SVT in the past, there was a concern of cardiac syncope.  Telemetry remains inconclusive. ?-After talking with cardiology yesterday they agreed to put a ZIO monitor on discharge. ?-After talking with patient and husband today does not look like a syncopal episode. ? ?Chronic diastolic (congestive) heart failure (HCC) ?Appears euvolemic.  Repeat echocardiogram with no other significant abnormality. ?-Continue to monitor ? ?Paroxysmal atrial fibrillation (HCC) ?Remained in sinus rhythm. ?-Continue to monitor ? ? ?Subjective: Patient was seen and examined today.  Husband at bedside.  No new complaints.  Per patient and husband she never lost consciousness and most likely lost her balance while mopping the floors.  She was able to get up herself and walk to the recliner from where she talked with her husband who was out of house at that time. ? ?Physical Exam: ?Vitals:  ? 12/23/21 0027 12/23/21 0450 12/23/21 0733 12/23/21 1218  ?BP: 136/66 (!) 146/72 136/65 132/62  ?Pulse: 76 74 68 78  ?Resp: '16 16 17 18  '$ ?Temp: 99.1 ?F (37.3 ?C) 99.4 ?F (37.4 ?C) 98.9 ?F (37.2 ?C)  98.2 ?F (36.8 ?C)  ?TempSrc:      ?SpO2: 99% 99% 99% 97%  ?Weight:      ?Height:      ? ?General.     In no acute distress.  Right craniotomy sutures seems healing. ?Pulmonary.  Lungs clear bilaterally, normal respiratory effort. ?CV.  Regular rate and rhythm, no JVD, rub or  murmur. ?Abdomen.  Soft, nontender, nondistended, BS positive. ?CNS.  Alert and oriented x3.  No focal neurologic deficit. ?Extremities.  No edema, no cyanosis, pulses intact and symmetrical. ?Psychiatry.  Judgment and insight appears normal. ? ?Data Reviewed: ?CBG reviewed-no other new labs today. ? ?Family Communication: Discussed with husband at bedside ? ?Disposition: ?Status is: Inpatient ?Remains inpatient appropriate because: Waiting for rehab. ? ? Planned Discharge Destination: Skilled nursing facility ? ?DVT prophylaxis.  Lovenox-started yesterday by neurosurgery ? ?Time spent: 40 minutes ? ?This record has been created using Systems analyst. Errors have been sought and corrected,but may not always be located. Such creation errors do not reflect on the standard of care. ? ?Author: ?Lorella Nimrod, MD ?12/23/2021 12:47 PM ? ?For on call review [email or web address].  ?

## 2021-12-24 DIAGNOSIS — I10 Essential (primary) hypertension: Secondary | ICD-10-CM

## 2021-12-24 LAB — GLUCOSE, CAPILLARY
Glucose-Capillary: 89 mg/dL (ref 70–99)
Glucose-Capillary: 107 mg/dL — ABNORMAL HIGH (ref 70–99)
Glucose-Capillary: 114 mg/dL — ABNORMAL HIGH (ref 70–99)
Glucose-Capillary: 175 mg/dL — ABNORMAL HIGH (ref 70–99)

## 2021-12-24 MED ORDER — LOSARTAN POTASSIUM 25 MG PO TABS
25.0000 mg | ORAL_TABLET | Freq: Every day | ORAL | Status: DC
Start: 2021-12-24 — End: 2021-12-25
  Administered 2021-12-24 – 2021-12-25 (×2): 25 mg via ORAL
  Filled 2021-12-24 (×2): qty 1

## 2021-12-24 NOTE — Progress Notes (Signed)
Attending Progress Note ? ?History: Kayla Gray is s/p right craniotomy for SDH evacuation  ? ?24-hour events: Kayla Gray is now postop day 6 from her subdural evacuation.  She ate breakfast but has decreased appetite. She is having no pain. She did get out of bed yesterday.  ? ?POD5: She appears stable overall.  She is having no new complaints. ? ?POD4: NAEO. ?POD3: NAEO ?POD2: AMS yesterday. Head CT, MRI, and EEG without explanation. Improved with holding narcotics. Pt without complaints this morning ?POD1: Complaints of headache overnight. Had CT head showing improved shift. Pt has been drowsy since 4 am.  ? ?Physical Exam: ?Vitals:  ? 12/24/21 1619 12/24/21 1935  ?BP: 132/68 126/66  ?Pulse: 85 87  ?Resp: 20 18  ?Temp: 98.3 ?F (36.8 ?C) 98.4 ?F (36.9 ?C)  ?SpO2: 99% 97%  ? ? ?Awake, cooperative ?Oriented to person and year.  ?Names and repeats ?Strength appears 5 out of 5 throughout upper and lower extremities ?Incision is well approximated ? ?Data: ? ?Recent Labs  ?Lab 12/18/21 ?1533 12/19/21 ?0706 12/20/21 ?0725  ?NA 138 139 140  ?K 3.8 3.8 3.6  ?CL 106 103 106  ?CO2 '27 24 27  '$ ?BUN 18 15 24*  ?CREATININE 0.91 0.78 0.95  ?GLUCOSE 120* 240* 112*  ?CALCIUM 8.9 8.6* 8.6*  ? ? ?Recent Labs  ?Lab 12/19/21 ?0706  ?AST 61*  ?ALT 37  ?ALKPHOS 50  ? ?  ? Recent Labs  ?Lab 12/18/21 ?1533 12/19/21 ?6301 12/20/21 ?0725  ?WBC 8.9 9.6 9.7  ?HGB 11.8* 10.7* 9.5*  ?HCT 37.5 33.3* 30.5*  ?PLT 155 151 124*  ? ? ?Recent Labs  ?Lab 12/18/21 ?1533  ?INR 1.1  ? ?  ?   ? ? ?Other tests/results:  ?CT head 12/19/21 ?IMPRESSION: ?1. Postoperative changes from interval right sided craniotomy for ?subdural evacuation. Residual small volume subdural hemorrhage along ?the falx and right tentorium measuring up to 6 mm in maximal ?thickness. Improved mass effect with trace 2 mm right-to-left shift. ?No hydrocephalus or trapping. ?2. No other new acute intracranial abnormality. ?Electronically Signed ?  By: Jeannine Boga M.D. ?  On:  12/19/2021 00:50 ? ?MRI brain 12/19/21 ?IMPRESSION: ?1. Small region of acute ischemia in the right superior frontal ?gyrus near the vertex corresponding to the finding on the same-day ?head CT, and punctate acute infarct in the right insular cortex. ?2. Stable postsurgical changes reflecting right frontoparietal ?craniotomy for subdural hematoma evacuation. Residual subdural blood ?over the right cerebral convexity, layering along the falx ?posteriorly, and right tentorial leaflet is overall similar to the ?same-day head CT, with minimal mass effect and no measurable midline ?shift. ?3. Subdural collections are also seen overlying the bilateral ?cerebellar hemispheres extending to the craniocervical junction. ?Please also see the separately dictated cervical spine MRI. ?  ?Electronically Signed ?  By: Valetta Mole M.D. ?  On: 12/19/2021 15:08 ? ?MRI C-spine 12/19/21 ?IMPRESSION: ?Extramedullary hemorrhage within the upper cervical spinal canal ?(extending from the craniocervical junction to the C5 level). ?Resultant mild-to-moderate narrowing of the upper cervical spinal ?canal (without spinal cord mass effect). ?  ?Cervical spondylosis, as outlined and having progressed at multiple ?levels since the prior MRI of 05/01/2017. ?  ?At C6-C7, there is progressive moderate/advanced disc degeneration ?with mild degenerative endplate edema. A posterior disc osteophyte ?complex contributes to progressive moderate spinal canal stenosis. ?Multifactorial severe bilateral neural foraminal narrowing also ?present at this level. ?  ?No more than mild degenerative spinal canal narrowing at the ?  remaining levels. ?  ?Multifactorial moderate left neural foraminal narrowing at C5-C6. ?  ?  ?Electronically Signed ?  By: [name] Simmering D.O. ?  On: 12/19/2021 15:23 ? ?EEG 12/19/21 ?IMPRESSION: ?This study is suggestive of cortical dysfunction arising from right hemisphere, maximal right frontal region consistent with underlying craniotomy and  SDH. Additionally there is moderate diffuse encephalopathy, nonspecific etiology. No seizures were seen throughout the recording. ?  ?Kayla Gray  ? ?Assessment/Plan: ? ?Kayla Gray is a 86 y.o presenting after a fall with a right sided SDH with midline shift and left sided weakness s/p right craniotomy for evacuation ? ?- Started ppx Lovenox  ?- hold all sedative medications ?- continue PT/OT. Considering IPR ?- Okay to shower ?- syncope workup underway-medicine team recommending monitor on discharge, order will need to be placed ?- Dispo pending placement ?- please page neurosurgery with any questions or concerns ? ?Deetta Perla, MD ?Department of Neurosurgery ? ?  ?

## 2021-12-24 NOTE — Progress Notes (Signed)
?Progress Note ? ? ?Patient: Kayla Gray EXN:[id number] DOB: 1936-07-01 DOA: 12/18/2021     6 ?DOS: the patient was seen and examined on 12/24/2021 ?  ?Brief hospital course: ?Taken from initial consult note. ? ?Kayla Gray is a 86 y.o. female with medical history significant of AS s/p bovine aortic valve replacement, cancer status post lumpectomy and radiation therapy, chronic diastolic CHF, HTN, paroxysmal SVT, who came to the hospital after 1 syncope episode. ?During that episode patient fell and hit her head.  No prodromal symptoms. ?Fall resulted in a large subdural hematoma with midline shift requiring right-sided craniotomy by neurosurgery.  Patient was admitted under neuro surgery service. ?We will ask for syncopal work-up. ? ?Syncope work-up so far remains inconclusive, telemetry with normal sinus rhythm, 2 very small episodes of SVT, lasting for less than 10 seconds and patient remained asymptomatic.  A1c of 5.7.  Patient does not have any history of diabetes or prediabetes.  No recent illnesses. ? ?Patient has an history of severe aortic stenosis s/p bioprosthetic aortic valve replacement about 3 years ago.  Repeat echocardiogram done today was with normal EF, grade 1 diastolic dysfunction, no regional wall abnormalities and properly functioning bioprosthetic valve without any abnormality noted. ? ?Patient denies any chest pain or shortness of breath. ? ?She was little more somnolent yesterday-became more alert today after decreasing opioids. ? ?3/11: Patient seems to be at her baseline now.  Syncope work-up so far negative.  Patient will need ZIO monitor on discharge, [facility] cardiology has to be notified on the day of discharge. ?Today husband and patient are telling me different story which was not consistent with syncopal episode.  Per patient and husband she was mopping and somehow lost her balance, may be slipped on a wet floor, per patient she never lost her consciousness, able to get herself up  and walk to the recliner and then called her husband who was out of the house at that time.  When husband saw her she appears confused so they called the EMS. ?This is more consistent with mechanical fall. ? ?3/12: Family decided to proceed with CIR, pending insurance authorization. ?Losartan was added for better control of hypertension as blood pressure remained elevated. ? ? ?Assessment and Plan: ?* Subdural hematoma ?Patient developed large subdural hematoma with a midline shift after hitting her head during the syncopal episode s/p right craniotomy and hematoma evacuation. ?-Being managed by neurosurgery ?-On prophylactic Keppra ? ?Syncope ?Patient denies any prodromal symptoms.  Patient has an history of paroxysmal atrial fibrillation and paroxysmal SVT in the past, there was a concern of cardiac syncope.  Telemetry remains inconclusive. ?-After talking with cardiology yesterday they agreed to put a ZIO monitor on discharge. ?-After talking with patient and husband on 12/23/21 does not look like a syncopal episode. ? ?Chronic diastolic (congestive) heart failure (HCC) ?Appears euvolemic.  Repeat echocardiogram with no other significant abnormality. ?-Continue to monitor ? ?Paroxysmal atrial fibrillation (HCC) ?Remained in sinus rhythm. ?-Continue to monitor ? ?Essential hypertension ?Blood pressure remained.  Patient needs improved control of blood pressure due to recent subdural hemorrhage requiring craniotomy and evacuation. ?-Add losartan ?-Continue with metoprolol ?-Monitor blood pressure and titrate doses as needed ? ? ?Subjective: Patient was lying in her bed when seen today.  Stating that she is not feeling well but denies any pain. ? ?Physical Exam: ?Vitals:  ? 12/23/21 2339 12/24/21 0517 12/24/21 0732 12/24/21 1151  ?BP: (!) 171/93 (!) 165/77 (!) 155/69 (!) 150/87  ?Pulse:  71 77 71 75  ?Resp: '16 16 16 18  '$ ?Temp: 98.8 ?F (37.1 ?C) 98.6 ?F (37 ?C) 98.4 ?F (36.9 ?C) 98.2 ?F (36.8 ?C)  ?TempSrc:   Oral    ?SpO2: 97% 97% 95% 97%  ?Weight:      ?Height:      ? ?General.     In no acute distress. ?Pulmonary.  Lungs clear bilaterally, normal respiratory effort. ?CV.  Regular rate and rhythm, no JVD, rub or murmur. ?Abdomen.  Soft, nontender, nondistended, BS positive. ?CNS.  Alert and oriented .  No focal neurologic deficit. ?Extremities.  No edema, no cyanosis, pulses intact and symmetrical. ?Psychiatry.  Judgment and insight appears normal. ? ?Data Reviewed: ?Prior notes and data reviewed ? ?Family Communication:  ? ?Disposition: ?Status is: Inpatient ?Remains inpatient appropriate because: Waiting for CIR ? ? Planned Discharge Destination: Rehab  ? ?DVT prophylaxis.  Lovenox ? ?Time spent: 40 minutes ? ?This record has been created using Systems analyst. Errors have been sought and corrected,but may not always be located. Such creation errors do not reflect on the standard of care. ? ?Author: ?Lorella Nimrod, MD ?12/24/2021 2:55 PM ? ?For on call review [email or web address].  ?

## 2021-12-24 NOTE — Assessment & Plan Note (Signed)
Blood pressure remained.  Patient needs improved control of blood pressure due to recent subdural hemorrhage requiring craniotomy and evacuation. ?-Add losartan ?-Continue with metoprolol ?-Monitor blood pressure and titrate doses as needed ?

## 2021-12-24 NOTE — TOC Progression Note (Addendum)
Transition of Care (TOC) - Progression Note  ? ? ?Patient Details  ?Name: Kayla Gray ?MRN: [id number] ?Date of Birth: 08/22/1936 ? ?Transition of Care (TOC) CM/SW Contact  ?Lydian Chavous E Baelynn Schmuhl, LCSW ?Phone Number: ?12/24/2021, 11:02 AM ? ?Clinical Narrative:    ?Call from patient's daughter [name] who confirms they have officially decided on CIR. ?She had some questions related to CIR, this CSW answered to the best of my ability then asked CIR Rep Lauren to call [facility]. ?Also asked CIR Rep Lauren for an update on insurance auth/bed availability at [facility]. Per Ander Purpura they should be able to take patient at the beginning of the week.  ? ? ? ?Expected Discharge Plan: [place] ?Barriers to Discharge: Continued Medical Work up ? ?Expected Discharge Plan and Services ?Expected Discharge Plan: [place] ?  ?Discharge Planning Services: CM Consult ?Post Acute Care Choice: IP Rehab ?Living arrangements for the past 2 months: [place] ?                ?DME Arranged: N/A ?DME Agency: NA ?  ?  ?  ?  ?  ?  ?  ?  ? ? ?Social Determinants of Health (SDOH) Interventions ?  ? ?Readmission Risk Interventions ?Readmission Risk Prevention Plan 12/20/2021  ?Transportation Screening Complete  ?PCP or Specialist Appt within 5-7 Days Complete  ?Home Care Screening Complete  ?Medication Review (RN CM) Complete  ?Some recent data might be hidden  ? ? ?

## 2021-12-24 NOTE — Assessment & Plan Note (Signed)
Patient denies any prodromal symptoms.  Patient has an history of paroxysmal atrial fibrillation and paroxysmal SVT in the past, there was a concern of cardiac syncope.  Telemetry remains inconclusive. ?-After talking with cardiology yesterday they agreed to put a ZIO monitor on discharge. ?-After talking with patient and husband on 12/23/21 does not look like a syncopal episode. ?

## 2021-12-24 NOTE — Progress Notes (Signed)
Physical Therapy Treatment Patient Details Name: Kayla Gray MRN: [id number] DOB: Jan 28, 1936 Today's Date: 12/24/2021   History of Present Illness Pt is a 86 y.o. female who presents to the ED after falling and Pt report of "striking the back of her head". Admitted for Subdural hematoma s/p R craniotomy for evacuation of hematoma (3/6). PmHx: A-Fib, History of Cancer, CHF, Depression, HTN, HLD, OA, GERD.    PT Comments    Pt seen for PT tx with pt received in bed & agreeable to tx. Pt requires significantly extra time to initiate & execute all tasks throughout session. Pt is able to complete bed mobility with supervision but reliance on HOB elevated. PT assisted with changing clothing & using BSC 2/2 incontinent followed by continent void. Pt is able to ambulate around bed to recliner without AD & min assist with impaired gait pattern as noted below. Pt completes [employer] with PT educating pt on interpretation of score & current fall risk. Patient demonstrates increased fall risk as noted by score of 14/56 on Berg Balance Scale.  (<36= high risk for falls, close to 100%; 37-45 significant >80%; 46-51 moderate >50%; 52-55 lower >25%). Continue to recommend CIR for pt as she would benefit from intensive multidisciplinary therapy to maximize independence with mobility & for increased safety & cognition.    Recommendations for follow up therapy are one component of a multi-disciplinary discharge planning process, led by the attending physician.  Recommendations may be updated based on patient status, additional functional criteria and insurance authorization.  Follow Up Recommendations  Acute inpatient rehab (3hours/day)     Assistance Recommended at Discharge Frequent or constant Supervision/Assistance  Patient can return home with the following A little help with walking and/or transfers;A lot of help with bathing/dressing/bathroom;Assistance with cooking/housework;Assist for  transportation;Direct supervision/assist for medications management;Help with stairs or ramp for entrance;Direct supervision/assist for financial management   Equipment Recommendations  Rolling walker (2 wheels);BSC/3in1    Recommendations for Other Services       Precautions / Restrictions Precautions Precautions: Fall Restrictions Weight Bearing Restrictions: No     Mobility  Bed Mobility Overal bed mobility: Needs Assistance Bed Mobility: Supine to Sit     Supine to sit: Supervision, HOB elevated (significantly extra time & use of bed rails, cuing to come to sitting on EOB with BLE feet on floor)          Transfers Overall transfer level: Needs assistance   Transfers: Sit to/from Stand, Bed to chair/wheelchair/BSC Sit to Stand: Min assist (pt completes multiple sit>stand from EOB but reaches out for support on counter; is able to complete sit>stand from recliner with close supervision x 1 occasion)   Step pivot transfers: Min assist, Min guard (bed>BSC with CGA<>Min assist with BUE support on bed/armrests of BSC)            Ambulation/Gait Ambulation/Gait assistance: Min assist Gait Distance (Feet): 15 Feet Assistive device: None Gait Pattern/deviations: Decreased step length - right, Decreased step length - left, Decreased stride length, Decreased dorsiflexion - right, Decreased dorsiflexion - left, Wide base of support, Trunk flexed Gait velocity: significantly decreased     General Gait Details: Guarded gait, cuing to not hold to furniture in room; pt reports feeling proud of herself for walking today   Stairs             Wheelchair Mobility    Modified Rankin (Stroke Patients Only)       Balance Overall balance assessment: Needs assistance  Sitting-balance support: No upper extremity supported, Feet supported Sitting balance-Leahy Scale: Fair Sitting balance - Comments: supervision static sitting, reaches for counter for support when leaning  over to attempt to unthread soiled underwear off BLE   Standing balance support: No upper extremity supported, During functional activity Standing balance-Leahy Scale: Poor                   Standardized Balance Assessment Standardized Balance Assessment : Berg Balance Test Berg Balance Test Sit to Stand: Able to stand using hands after several tries Standing Unsupported: Able to stand 2 minutes with supervision Sitting with Back Unsupported but Feet Supported on Floor or Stool: Able to sit 2 minutes under supervision Stand to Sit: Sits independently, has uncontrolled descent Transfers: Able to transfer with verbal cueing and /or supervision Standing Unsupported with Eyes Closed: Able to stand 10 seconds with supervision Standing Ubsupported with Feet Together: Needs help to attain position and unable to hold for 15 seconds From Standing, Reach Forward with Outstretched Arm: Loses balance while trying/requires external support From Standing Position, Pick up Object from Floor: Unable to try/needs assist to keep balance From Standing Position, Turn to Look Behind Over each Shoulder: Needs assist to keep from losing balance and falling Turn 360 Degrees: Needs assistance while turning Standing Unsupported, Alternately Place Feet on Step/Stool: Needs assistance to keep from falling or unable to try Standing Unsupported, One Foot in Front: Loses balance while stepping or standing Standing on One Leg: Unable to try or needs assist to prevent fall Total Score: 14        Cognition Arousal/Alertness: Awake/alert Behavior During Therapy: Flat affect Overall Cognitive Status: Impaired/Different from baseline Area of Impairment: Memory, Following commands, Safety/judgement               [place] Levels of Cognitive Functioning [employer] Scales of Cognitive Functioning: Confused/appropriate     Memory: Decreased short-term memory Following Commands: Follows one step  commands with increased time Safety/Judgement: Decreased awareness of safety, Decreased awareness of deficits   Problem Solving: Slow processing, Decreased initiation, Difficulty sequencing, Requires verbal cues     [place] [employer] Scales of Cognitive Functioning: Confused/appropriate    Exercises      General Comments General comments (skin integrity, edema, etc.): Pt incontinent of urine upon PT arrival with PT assisting with donning clean clothing. Pt with additional continent void on [facility] & performs peri hygiene without physical assistance.      Pertinent Vitals/Pain Pain Assessment Pain Assessment: No/denies pain    Home Living                          Prior Function            PT Goals (current goals can now be found in the care plan section) Acute Rehab PT Goals Patient Stated Goal: none stated PT Goal Formulation: With patient Time For Goal Achievement: 01/03/22 Potential to Achieve Goals: Fair Progress towards PT goals: Progressing toward goals    Frequency    7X/week      PT Plan Current plan remains appropriate    Co-evaluation              AM-PAC PT "6 Clicks" Mobility   Outcome Measure  Help needed turning from your back to your side while in a flat bed without using bedrails?: None Help needed moving from lying on your back to sitting on the side of a flat bed  without using bedrails?: A Little Help needed moving to and from a bed to a chair (including a wheelchair)?: A Little Help needed standing up from a chair using your arms (e.g., wheelchair or bedside chair)?: A Little Help needed to walk in hospital room?: A Little Help needed climbing 3-5 steps with a railing? : A Lot 6 Click Score: 18    End of Session Equipment Utilized During Treatment: Gait belt Activity Tolerance: Patient tolerated treatment well Patient left: in chair;with chair alarm set;with call bell/phone within reach;with nursing/sitter in room Nurse  Communication: Mobility status PT Visit Diagnosis: Unsteadiness on feet (R26.81);History of falling (Z91.81);Muscle weakness (generalized) (M62.81)     Time: 6222-9798 PT Time Calculation (min) (ACUTE ONLY): 30 min  Charges:  $Therapeutic Activity: 23-37 mins                     Lavone Nian, PT, DPT 12/24/21, 10:54 AM    Kayla Gray 12/24/2021, 10:52 AM

## 2021-12-25 ENCOUNTER — Inpatient Hospital Stay (INDEPENDENT_AMBULATORY_CARE_PROVIDER_SITE_OTHER): Payer: PPO

## 2021-12-25 ENCOUNTER — Telehealth: Payer: Self-pay | Admitting: *Deleted

## 2021-12-25 ENCOUNTER — Other Ambulatory Visit: Payer: Self-pay

## 2021-12-25 ENCOUNTER — Inpatient Hospital Stay (HOSPITAL_COMMUNITY)
Admission: RE | Admit: 2021-12-25 | Discharge: 2022-01-05 | DRG: 945 | Disposition: A | Discharge status: Home / Self Care | Payer: PPO | Source: Other Acute Inpatient Hospital | Attending: Physical Medicine & Rehabilitation | Admitting: Physical Medicine & Rehabilitation

## 2021-12-25 ENCOUNTER — Encounter (HOSPITAL_COMMUNITY): Payer: Self-pay | Admitting: Physical Medicine & Rehabilitation

## 2021-12-25 DIAGNOSIS — E785 Hyperlipidemia, unspecified: Secondary | ICD-10-CM | POA: Diagnosis present

## 2021-12-25 DIAGNOSIS — I472 Ventricular tachycardia, unspecified: Secondary | ICD-10-CM | POA: Diagnosis not present

## 2021-12-25 DIAGNOSIS — Z923 Personal history of irradiation: Secondary | ICD-10-CM

## 2021-12-25 DIAGNOSIS — I480 Paroxysmal atrial fibrillation: Secondary | ICD-10-CM | POA: Diagnosis present

## 2021-12-25 DIAGNOSIS — W1830XD Fall on same level, unspecified, subsequent encounter: Secondary | ICD-10-CM

## 2021-12-25 DIAGNOSIS — Z8673 Personal history of transient ischemic attack (TIA), and cerebral infarction without residual deficits: Secondary | ICD-10-CM | POA: Diagnosis not present

## 2021-12-25 DIAGNOSIS — Z96653 Presence of artificial knee joint, bilateral: Secondary | ICD-10-CM | POA: Diagnosis not present

## 2021-12-25 DIAGNOSIS — R55 Syncope and collapse: Secondary | ICD-10-CM

## 2021-12-25 DIAGNOSIS — R1312 Dysphagia, oropharyngeal phase: Secondary | ICD-10-CM | POA: Diagnosis not present

## 2021-12-25 DIAGNOSIS — I5032 Chronic diastolic (congestive) heart failure: Secondary | ICD-10-CM | POA: Diagnosis not present

## 2021-12-25 DIAGNOSIS — G4089 Other seizures: Secondary | ICD-10-CM | POA: Diagnosis not present

## 2021-12-25 DIAGNOSIS — R531 Weakness: Secondary | ICD-10-CM | POA: Diagnosis present

## 2021-12-25 DIAGNOSIS — D62 Acute posthemorrhagic anemia: Secondary | ICD-10-CM | POA: Diagnosis present

## 2021-12-25 DIAGNOSIS — I471 Supraventricular tachycardia: Secondary | ICD-10-CM

## 2021-12-25 DIAGNOSIS — Z953 Presence of xenogenic heart valve: Secondary | ICD-10-CM | POA: Diagnosis not present

## 2021-12-25 DIAGNOSIS — Z90710 Acquired absence of both cervix and uterus: Secondary | ICD-10-CM | POA: Diagnosis not present

## 2021-12-25 DIAGNOSIS — K219 Gastro-esophageal reflux disease without esophagitis: Secondary | ICD-10-CM | POA: Diagnosis present

## 2021-12-25 DIAGNOSIS — Z85828 Personal history of other malignant neoplasm of skin: Secondary | ICD-10-CM

## 2021-12-25 DIAGNOSIS — R1310 Dysphagia, unspecified: Secondary | ICD-10-CM

## 2021-12-25 DIAGNOSIS — S065XAA Traumatic subdural hemorrhage with loss of consciousness status unknown, initial encounter: Secondary | ICD-10-CM

## 2021-12-25 DIAGNOSIS — Z8249 Family history of ischemic heart disease and other diseases of the circulatory system: Secondary | ICD-10-CM | POA: Diagnosis not present

## 2021-12-25 DIAGNOSIS — I110 Hypertensive heart disease with heart failure: Secondary | ICD-10-CM | POA: Diagnosis not present

## 2021-12-25 DIAGNOSIS — S065XAD Traumatic subdural hemorrhage with loss of consciousness status unknown, subsequent encounter: Secondary | ICD-10-CM | POA: Diagnosis not present

## 2021-12-25 DIAGNOSIS — Z853 Personal history of malignant neoplasm of breast: Secondary | ICD-10-CM | POA: Diagnosis not present

## 2021-12-25 DIAGNOSIS — R7303 Prediabetes: Secondary | ICD-10-CM | POA: Diagnosis not present

## 2021-12-25 DIAGNOSIS — Z803 Family history of malignant neoplasm of breast: Secondary | ICD-10-CM

## 2021-12-25 DIAGNOSIS — I10 Essential (primary) hypertension: Secondary | ICD-10-CM

## 2021-12-25 DIAGNOSIS — N3281 Overactive bladder: Secondary | ICD-10-CM | POA: Diagnosis not present

## 2021-12-25 LAB — GLUCOSE, CAPILLARY
Glucose-Capillary: 96 mg/dL (ref 70–99)
Glucose-Capillary: 114 mg/dL — ABNORMAL HIGH (ref 70–99)
Glucose-Capillary: 125 mg/dL — ABNORMAL HIGH (ref 70–99)

## 2021-12-25 MED ORDER — GUAIFENESIN-DM 100-10 MG/5ML PO SYRP: 5.0000 mL | ORAL_SOLUTION | Freq: Four times a day (QID) | ORAL | Status: DC | PRN

## 2021-12-25 MED ORDER — ADULT MULTIVITAMIN W/MINERALS CH
1.0000 | ORAL_TABLET | Freq: Every day | ORAL | Status: DC
  Administered 2021-12-26 – 2022-01-05 (×11): 1 via ORAL
  Filled 2021-12-25 (×11): qty 1

## 2021-12-25 MED ORDER — TRAZODONE HCL 50 MG PO TABS
25.0000 mg | ORAL_TABLET | Freq: Every evening | ORAL | Status: DC | PRN
  Filled 2021-12-25: qty 1

## 2021-12-25 MED ORDER — ORAL CARE MOUTH RINSE
15.0000 mL | Freq: Two times a day (BID) | OROMUCOSAL | Status: DC
Start: 2021-12-25 — End: 2022-01-05
  Administered 2021-12-25 – 2022-01-04 (×20): 15 mL via OROMUCOSAL

## 2021-12-25 MED ORDER — METOPROLOL TARTRATE 25 MG PO TABS
25.0000 mg | ORAL_TABLET | Freq: Two times a day (BID) | ORAL | Status: DC
  Administered 2021-12-25 – 2022-01-05 (×22): 25 mg via ORAL
  Filled 2021-12-25 (×23): qty 1

## 2021-12-25 MED ORDER — ALUM & MAG HYDROXIDE-SIMETH 200-200-20 MG/5ML PO SUSP
30.0000 mL | ORAL | Status: DC | PRN
Start: 2021-12-25 — End: 2022-01-05

## 2021-12-25 MED ORDER — PANTOPRAZOLE SODIUM 40 MG PO TBEC
40.0000 mg | DELAYED_RELEASE_TABLET | Freq: Every day | ORAL | Status: DC
  Administered 2021-12-26 – 2022-01-01 (×7): 40 mg via ORAL
  Filled 2021-12-25 (×7): qty 1

## 2021-12-25 MED ORDER — SENNA 8.6 MG PO TABS
1.0000 | ORAL_TABLET | Freq: Two times a day (BID) | ORAL | Status: DC
  Administered 2021-12-25 – 2022-01-05 (×19): 8.6 mg via ORAL
  Filled 2021-12-25 (×21): qty 1

## 2021-12-25 MED ORDER — ACETAMINOPHEN 325 MG PO TABS
325.0000 mg | ORAL_TABLET | ORAL | Status: DC | PRN
  Administered 2021-12-28 – 2022-01-04 (×4): 650 mg via ORAL
  Filled 2021-12-25 (×4): qty 2

## 2021-12-25 MED ORDER — ENOXAPARIN SODIUM 40 MG/0.4ML IJ SOSY
40.0000 mg | PREFILLED_SYRINGE | INTRAMUSCULAR | Status: DC
Start: 2021-12-25 — End: 2022-01-05
  Administered 2021-12-25 – 2022-01-04 (×11): 40 mg via SUBCUTANEOUS
  Filled 2021-12-25 (×11): qty 0.4

## 2021-12-25 MED ORDER — INSULIN ASPART 100 UNIT/ML IJ SOLN
0.0000 [IU] | Freq: Three times a day (TID) | INTRAMUSCULAR | Status: DC
  Administered 2021-12-25: 1 [IU] via SUBCUTANEOUS

## 2021-12-25 MED ORDER — PAROXETINE HCL 20 MG PO TABS
20.0000 mg | ORAL_TABLET | Freq: Every day | ORAL | Status: DC
  Administered 2021-12-26 – 2022-01-05 (×11): 20 mg via ORAL
  Filled 2021-12-25 (×11): qty 1

## 2021-12-25 MED ORDER — FLEET ENEMA 7-19 GM/118ML RE ENEM: 1.0000 | ENEMA | Freq: Once | RECTAL | Status: DC | PRN

## 2021-12-25 MED ORDER — LOSARTAN POTASSIUM 25 MG PO TABS: 25.0000 mg | ORAL_TABLET | Freq: Every day | ORAL | Status: DC

## 2021-12-25 MED ORDER — CHLORHEXIDINE GLUCONATE 0.12 % MT SOLN
15.0000 mL | Freq: Two times a day (BID) | OROMUCOSAL | Status: DC
  Administered 2021-12-25 – 2022-01-05 (×22): 15 mL via OROMUCOSAL
  Filled 2021-12-25 (×22): qty 15

## 2021-12-25 MED ORDER — DIPHENHYDRAMINE HCL 12.5 MG/5ML PO ELIX
12.5000 mg | ORAL_SOLUTION | Freq: Four times a day (QID) | ORAL | Status: DC | PRN
  Administered 2021-12-25: 25 mg via ORAL
  Filled 2021-12-25: qty 10

## 2021-12-25 MED ORDER — OXYBUTYNIN CHLORIDE ER 5 MG PO TB24
5.0000 mg | ORAL_TABLET | Freq: Every day | ORAL | Status: DC
  Administered 2021-12-26 – 2022-01-05 (×11): 5 mg via ORAL
  Filled 2021-12-25 (×12): qty 1

## 2021-12-25 MED ORDER — PROCHLORPERAZINE MALEATE 5 MG PO TABS: 5.0000 mg | ORAL_TABLET | Freq: Four times a day (QID) | ORAL | Status: DC | PRN

## 2021-12-25 MED ORDER — PROCHLORPERAZINE 25 MG RE SUPP: 12.5000 mg | Freq: Four times a day (QID) | RECTAL | Status: DC | PRN

## 2021-12-25 MED ORDER — EZETIMIBE 10 MG PO TABS
10.0000 mg | ORAL_TABLET | Freq: Every day | ORAL | Status: DC
  Administered 2021-12-26 – 2022-01-05 (×11): 10 mg via ORAL
  Filled 2021-12-25 (×11): qty 1

## 2021-12-25 MED ORDER — LOSARTAN POTASSIUM 50 MG PO TABS
25.0000 mg | ORAL_TABLET | Freq: Every day | ORAL | Status: DC
  Administered 2021-12-26 – 2022-01-05 (×11): 25 mg via ORAL
  Filled 2021-12-25 (×11): qty 1

## 2021-12-25 MED ORDER — POLYETHYLENE GLYCOL 3350 17 G PO PACK: 17.0000 g | PACK | Freq: Every day | ORAL | Status: DC | PRN

## 2021-12-25 MED ORDER — BISACODYL 10 MG RE SUPP: 10.0000 mg | Freq: Every day | RECTAL | Status: DC | PRN

## 2021-12-25 MED ORDER — PROCHLORPERAZINE EDISYLATE 10 MG/2ML IJ SOLN: 5.0000 mg | Freq: Four times a day (QID) | INTRAMUSCULAR | Status: DC | PRN

## 2021-12-25 MED ORDER — LEVETIRACETAM 500 MG PO TABS
500.0000 mg | ORAL_TABLET | Freq: Two times a day (BID) | ORAL | Status: DC
  Administered 2021-12-25 – 2022-01-01 (×14): 500 mg via ORAL
  Filled 2021-12-25 (×14): qty 1

## 2021-12-25 MED ORDER — CHLORHEXIDINE GLUCONATE CLOTH 2 % EX PADS: 6.0000 | MEDICATED_PAD | Freq: Every day | CUTANEOUS | Status: DC

## 2021-12-25 NOTE — TOC Progression Note (Signed)
Transition of Care (TOC) - Progression Note  ? ? ?Patient Details  ?Name: Kayla Gray ?MRN: [id number] ?Date of Birth: 09-14-1936 ? ?Transition of Care (TOC) CM/SW Contact  ?Jonisha Kindig A Oyuki Hogan, LCSW ?Phone Number: ?12/25/2021, 12:14 PM ? ?Clinical Narrative:   Per Urban Gibson with CIR transport has been arranged for 12:30. EMS ppwk on chart. RN aware of transport time. ? ? ? ?Expected Discharge Plan: [place] ?Barriers to Discharge: Continued Medical Work up ? ?Expected Discharge Plan and Services ?Expected Discharge Plan: [place] ?  ?Discharge Planning Services: CM Consult ?Post Acute Care Choice: IP Rehab ?Living arrangements for the past 2 months: [place] ?Expected Discharge Date: 12/25/21               ?DME Arranged: N/A ?DME Agency: NA ?  ?  ?  ?  ?  ?  ?  ?  ? ? ?Social Determinants of Health (SDOH) Interventions ?  ? ?Readmission Risk Interventions ?Readmission Risk Prevention Plan 12/20/2021  ?Transportation Screening Complete  ?PCP or Specialist Appt within 5-7 Days Complete  ?Home Care Screening Complete  ?Medication Review (RN CM) Complete  ?Some recent data might be hidden  ? ? ?

## 2021-12-25 NOTE — H&P (Signed)
Physical Medicine and Rehabilitation Admission H&P    CC: Functional deficits due to SDH   HPI: Kayla Gray is an 86 year old female with history of HTN, iron deficiency anemia, chronic dCHF, breast cancer, SVT who was admitted to [facility] on 12/18/21 after fall (while mopping her floor), striking back of her head with syncope. She was noted to have hematoma on the back of her head, reported HA and was vomiting at admission. CT head done revealing 1.3 cm R-SDH along falx and right tentorium with 1 cm leftward midline shift and moderate size occipital scalp hematoma. She was taken to OR for right crani for evacuation of SDH with placement of SD drain by Dr. Marcell Barlow the same day. Post op with lethargy with ongoing HA and EEG done revealing right frontal cortical dysfunction with  moderate diffuse encephalopathy and was negative for seizures.   AMS felt to be due to narcotics which was discontinued and follow up CT head stable post op changes with blood extending along dorsal and ventral aspects of craniocervical junction into upper cervical spine surrounding cord. Cervical MRI done for work up showing extramedullary hemorrhage extending to C5 with mild to moderate narrowing of upper cervical canal without mass effect and moderate advanced disc degeneration C6-C7 with severe bilateral neural foraminal narrowing. MRI Brain showed stable bleed with acute ischemia right superior frontal gyrus and right insular cortex. 2D echo showed EF 60-65%, mild MVR and functioning bioprosthetic AV.  Mentation has improved and cardiology consulted for input due to history of PAF. ZIO patch recommended to rule out cardiac source. BP has been labile and Losartan added for better control. Therapy ongoing and patient limited by weakness, balance deficits and need increased time for processing. CIR recommended due to functional decline.      Review of Systems  Constitutional:  Negative for fever.  HENT:  Negative for  hearing loss.   Eyes:  Negative for blurred vision.  Respiratory:  Negative for cough and shortness of breath.   Cardiovascular:  Negative for chest pain and leg swelling.  Gastrointestinal:  Negative for abdominal pain, constipation and heartburn.  Genitourinary:        Nocturia --wears depends at nights  Musculoskeletal:  Negative for back pain, myalgias and neck pain.  Skin:  Negative for rash.  Neurological:  Positive for focal weakness and headaches. Negative for dizziness.    Past Medical History:  Diagnosis Date   Aortic stenosis    Bilateral cataracts    Bleeding nose    Thurs night (09/08/15) and Fri (09/09/15) left side.Bleeding didn't last long   Breast cancer (HCC) 1999   right breast lumpectomy and rad tx   Chronic diastolic (congestive) heart failure (HCC)    Depression    Diverticulosis    Gastroesophageal reflux disease    Heart murmur    Hyperlipidemia    Hypertension    Iron deficiency anemia    Obstructive sleep apnea    Osteoarthritis    Osteopenia    Peptic ulcer disease    Personal history of radiation therapy    S/P aortic valve replacement with bioprosthetic valve 09/16/2015   23 mm Edwards Intuity Elite bioprosthetic tissue valve   Skin cancer 2020   Stroke (HCC)    11/14/13   Supraventricular tachycardia (HCC)    Syncope and collapse    Urinary tract infection     Past Surgical History:  Procedure Laterality Date   AORTIC VALVE REPLACEMENT N/A 09/16/2015  Procedure: AORTIC VALVE REPLACEMENT (AVR);  Surgeon: Purcell Nails, MD;  Location: [facility] OR;  Service: Open Heart Surgery;  Laterality: N/A;   BREAST BIOPSY Right 1999   breast ca radation   BREAST EXCISIONAL BIOPSY Left yrs ago    benign   BREAST LUMPECTOMY Right 1999   with radiation   CARDIAC CATHETERIZATION N/A 08/17/2015   Procedure: Left Heart Cath and Coronary Angiography;  Surgeon: Antonieta Iba, MD;  Location: [facility] INVASIVE CV LAB;  Service: Cardiovascular;  Laterality: N/A;    CATARACT EXTRACTION, BILATERAL     CHOLECYSTECTOMY     CRANIOTOMY Right 12/18/2021   Procedure: CRANIOTOMY HEMATOMA EVACUATION SUBDURAL;  Surgeon: Venetia Night, MD;  Location: [facility] ORS;  Service: Neurosurgery;  Laterality: Right;   JOINT REPLACEMENT Bilateral    knee replacement   KNEE ARTHROSCOPY     right   KNEE ARTHROSCOPY     left   SKIN CANCER EXCISION  2020   TEE WITHOUT CARDIOVERSION N/A 09/16/2015   Procedure: TRANSESOPHAGEAL ECHOCARDIOGRAM (TEE);  Surgeon: Purcell Nails, MD;  Location: [facility] OR;  Service: Open Heart Surgery;  Laterality: N/A;   TONSILLECTOMY     VAGINAL HYSTERECTOMY      Family History  Problem Relation Age of Onset   Heart failure Mother    Breast cancer Daughter 105   Breast cancer Maternal Aunt     Social History:  reports that she has never smoked. She has never used smokeless tobacco. She reports that she does not currently use alcohol. She reports that she does not use drugs.   Allergies  Allergen Reactions   Evista [Raloxifene]    Sulfa Antibiotics Hives   Codeine Hives   Nitrofurantoin Rash   Oxycodone-Acetaminophen Rash   Statins Other (See Comments) and Rash    Leg muscle cramps    Medications Prior to Admission  Medication Sig Dispense Refill   aspirin EC 81 MG tablet Take 1 tablet (81 mg total) by mouth daily. 90 tablet 3   Calcium Carbonate-Vitamin D (CALCIUM 600 + D PO) Take by mouth 2 (two) times daily.     ezetimibe (ZETIA) 10 MG tablet Take 10 mg by mouth daily.     fluticasone (FLONASE) 50 MCG/ACT nasal spray Place 2 sprays into the nose daily as needed.     metoprolol tartrate (LOPRESSOR) 25 MG tablet TAKE 1 TABLET BY MOUTH TWICE A DAY 180 tablet 1   Multiple Vitamin (MULTIVITAMIN) tablet Take 1 tablet by mouth daily.     pantoprazole (PROTONIX) 40 MG tablet Take 40 mg by mouth daily as needed.     acetaminophen (TYLENOL) 325 MG tablet Take 650 mg by mouth every 6 (six) hours as needed.     amoxicillin (AMOXIL) 500 MG  tablet Take 4 tablets (2,000 mg total) by mouth as directed. Take 2 hours before surgery or dental work (Patient not taking: Reported on 11/06/2021) 4 tablet 1   ketoconazole (NIZORAL) 2 % shampoo Apply topically 3 (three) times a week.     oxybutynin (DITROPAN-XL) 5 MG 24 hr tablet Take 5 mg by mouth daily.     PARoxetine (PAXIL) 20 MG tablet Take 20 mg by mouth daily.        Home: Home Living Family/patient expects to be discharged to:: Private residence Living Arrangements: Spouse/significant other Available Help at Discharge:  (limit assistance by family) Type of Home: House Home Access: Stairs to enter [employer] of Steps: 4 Entrance Stairs-Rails: Right Home Layout: Able to  live on main level with bedroom/bathroom Bathroom Shower/Tub: Engineer, manufacturing systems: Standard Home Equipment: Grab bars - tub/shower, Toilet riser, Cane - single point, Agricultural consultant (2 wheels), BSC/3in1 Additional Comments: Per past chart history: Pt utilizes SPC in community and no AD at home  Lives With: Spouse   Functional History: Prior Function Prior Level of Function : Independent/Modified Independent ADLs Comments: Pt and pt's husband report she was independent with all ADLs at baseline  Functional Status:  Mobility: Bed Mobility Overal bed mobility: Needs Assistance Bed Mobility: Supine to Sit Supine to sit: Supervision, HOB elevated (significantly extra time & use of bed rails, cuing to come to sitting on EOB with BLE feet on floor) Sit to supine: Min guard General bed mobility comments: Pt received sitting in recliner Transfers Overall transfer level: Needs assistance Equipment used: Rolling walker (2 wheels) Transfers: Sit to/from Stand, Bed to chair/wheelchair/BSC Sit to Stand: Min assist (pt completes multiple sit>stand from EOB but reaches out for support on counter; is able to complete sit>stand from recliner with close supervision x 1 occasion) Bed to/from  chair/wheelchair/BSC transfer type:: Step pivot Step pivot transfers: Min assist, Min guard (bed>BSC with CGA<>Min assist with BUE support on bed/armrests of BSC) General transfer comment: pt stood 1 x EOB with mod assist + bed height elevated. pt stood ~ 5 sec prior to reporting" I dont feel well." Pt was returned to bed and repositioned. unable to state what didnt feel well prior to falling back to sleep. Ambulation/Gait Ambulation/Gait assistance: Min assist Gait Distance (Feet): 15 Feet Assistive device: None Gait Pattern/deviations: Decreased step length - right, Decreased step length - left, Decreased stride length, Decreased dorsiflexion - right, Decreased dorsiflexion - left, Wide base of support, Trunk flexed General Gait Details: Guarded gait, cuing to not hold to furniture in room; pt reports feeling proud of herself for walking today Gait velocity: significantly decreased    ADL: ADL Overall ADL's : Needs assistance/impaired Eating/Feeding: Supervision/ safety, Set up, Sitting Eating/Feeding Details (indicate cue type and reason): able to bring cup to mouth and drink via straw Toilet Transfer: Minimal assistance, Min guard, BSC/3in1, Rolling walker (2 wheels) Toilet Transfer Details (indicate cue type and reason): Close SBA to MIN A during STS during transfer to [facility] placed over room toilet. Pt rquires significant increased time/effort to perform functional mobility during session. Moderate multimodal cueing provided t/o session to alert to obstacles in room/safety during session. Toileting- Architect and Hygiene: Min guard, Sit to/from stand, Cueing for safety, With adaptive equipment Toileting - Clothing Manipulation Details (indicate cue type and reason): Close SBA for management of mesh underwear and peri-care after using commode. Cueing for safety during task. Close SBA to MIN A for standing hand washing at sink. General ADL Comments: Anticipate increased assist  required without use of RW (pt baseline) during functional tasks.  Cognition: Cognition Overall Cognitive Status: Impaired/Different from baseline Arousal/Alertness: Awake/alert Orientation Level: Oriented to person, Oriented to place Year: 2022 Month: March Day of Week: Incorrect Attention: Selective Selective Attention: Impaired Selective Attention Impairment: Verbal basic, Functional basic Memory: Impaired Memory Impairment: Retrieval deficit, Decreased recall of new information, Decreased short term memory Decreased Short Term Memory: Verbal basic, Functional basic Awareness: Impaired Awareness Impairment: Intellectual impairment, Emergent impairment Problem Solving: Impaired Problem Solving Impairment: Verbal complex, Functional complex Executive Function:  (all impaired by lower level deficits) Behaviors: Poor frustration tolerance Safety/Judgment: Impaired [place] [employer] Scales of Cognitive Functioning: Confused/appropriate Cognition Arousal/Alertness: Awake/alert Behavior During Therapy: Flat affect  Overall Cognitive Status: Impaired/Different from baseline Area of Impairment: Memory, Following commands, Safety/judgement Orientation Level: Person, Place, Time, Situation Memory: Decreased short-term memory Following Commands: Follows one step commands with increased time Safety/Judgement: Decreased awareness of safety, Decreased awareness of deficits Problem Solving: Slow processing, Decreased initiation, Difficulty sequencing, Requires verbal cues General Comments: Pt requires MOD cues for safety with mobility. MOD cueing for safety with med task activity with increased assist required to follow multi-step directions on pill bottles.  Physical Exam: Blood pressure (!) 153/81, pulse 76, temperature 99.4 F (37.4 C), resp. rate 16, height 5\' 2"  (1.575 m), weight 76.7 kg, SpO2 96 %. Physical Exam Vitals and nursing note reviewed.  Constitutional:      Appearance:  Normal appearance.  HENT:     Head:     Comments: Right curvilinear incision C/D/I with staples in place.     Right Ear: External ear normal.     Left Ear: External ear normal.     Nose: Nose normal.     Mouth/Throat:     Mouth: Mucous membranes are moist.     Pharynx: Oropharynx is clear.  Eyes:     Pupils: Pupils are equal, round, and reactive to light.  Cardiovascular:     Rate and Rhythm: Normal rate and regular rhythm.     Heart sounds: No murmur heard.   No gallop.  Pulmonary:     Effort: Pulmonary effort is normal. No respiratory distress.     Breath sounds: No wheezing.  Abdominal:     General: Bowel sounds are normal. There is no distension.     Palpations: Abdomen is soft.  Musculoskeletal:        General: No swelling or tenderness. Normal range of motion.     Cervical back: Normal range of motion.     Comments: Well healed old B-TKR incisions.   Skin:    General: Skin is warm and dry.  Neurological:     Mental Status: She is alert and oriented to person, place, and time.     Comments: Pt alert, oriented to name, hospital, month/year, reason she's here. Recalls moments leading up to fall. Recalls events after fall. Normal language, speech. No focal CN abnl. Motor: 4/5 UE prox to distal. RLE 3/5 prox to 4/5 distally. LLE 3- prox to 4- distally. No focal sensory deficits. No obvious ataxia. Normal tone and DTR's  Psychiatric:        Mood and Affect: Mood normal.        Behavior: Behavior normal.    Results for orders placed or performed during the hospital encounter of 12/18/21 (from the past 48 hour(s))  Glucose, capillary     Status: Abnormal   Collection Time: 12/23/21 12:20 PM  Result Value Ref Range   Glucose-Capillary 163 (H) 70 - 99 mg/dL    Comment: Glucose reference range applies only to samples taken after fasting for at least 8 hours.  Glucose, capillary     Status: Abnormal   Collection Time: 12/23/21  3:30 PM  Result Value Ref Range    Glucose-Capillary 106 (H) 70 - 99 mg/dL    Comment: Glucose reference range applies only to samples taken after fasting for at least 8 hours.  Glucose, capillary     Status: None   Collection Time: 12/23/21  7:55 PM  Result Value Ref Range   Glucose-Capillary 71 70 - 99 mg/dL    Comment: Glucose reference range applies only to samples taken after fasting for at  least 8 hours.  Glucose, capillary     Status: None   Collection Time: 12/24/21  7:32 AM  Result Value Ref Range   Glucose-Capillary 89 70 - 99 mg/dL    Comment: Glucose reference range applies only to samples taken after fasting for at least 8 hours.  Glucose, capillary     Status: Abnormal   Collection Time: 12/24/21 11:52 AM  Result Value Ref Range   Glucose-Capillary 107 (H) 70 - 99 mg/dL    Comment: Glucose reference range applies only to samples taken after fasting for at least 8 hours.  Glucose, capillary     Status: Abnormal   Collection Time: 12/24/21  4:20 PM  Result Value Ref Range   Glucose-Capillary 114 (H) 70 - 99 mg/dL    Comment: Glucose reference range applies only to samples taken after fasting for at least 8 hours.  Glucose, capillary     Status: Abnormal   Collection Time: 12/24/21  9:00 PM  Result Value Ref Range   Glucose-Capillary 175 (H) 70 - 99 mg/dL    Comment: Glucose reference range applies only to samples taken after fasting for at least 8 hours.  Glucose, capillary     Status: None   Collection Time: 12/25/21  7:30 AM  Result Value Ref Range   Glucose-Capillary 96 70 - 99 mg/dL    Comment: Glucose reference range applies only to samples taken after fasting for at least 8 hours.   No results found.    Blood pressure (!) 153/81, pulse 76, temperature 99.4 F (37.4 C), resp. rate 16, height 5\' 2"  (1.575 m), weight 76.7 kg, SpO2 96 %.  Medical Problem List and Plan: 1. Functional deficits secondary to right SDH after fall. Pt s/p craniotomy  -patient may  shower  -ELOS/Goals: 12-14 days,  supervision 2.  Antithrombotics: -DVT/anticoagulation:  Pharmaceutical: Lovenox  -antiplatelet therapy: N/A due to bleed.  3. Pain Management: Tylenol prn.  4. Mood: LCSW to follow for evaluation and support.   -antipsychotic agents: N/A 5. Neuropsych: This patient is capable of making decisions on her own behalf. 6. Skin/Wound Care: Routine pressure relief measures.  7. Fluids/Electrolytes/Nutrition: Monitor I/O. Check lytes in am. 8. HTN: Monitor BP TID--continue metoprolol and Losartan.  9. Chronic diastolic CHF: Low salt diet.   -Check daily weights.  --Monitor for signs of overload.  10. Pre-diabetes:  Hgb A1c-5.7. Will add low carb restrictions. Continue to monitor BS for a few more days.  --RD consulted for diet education.  11. H/o SVT/ PAF: Monitor HR TID--continue metoprolol. Zio patch to be mailed to pt  -apparently had a short run of VT prior transfer this morning 12. Seizure prophylaxis: Continue Keppra BID.  13. Hyperactive bladder: Now on ditropan.   -pt reports baseline urinary frequency   Jacquelynn Cree, PA-C 12/25/2021

## 2021-12-25 NOTE — TOC Progression Note (Addendum)
Transition of Care (TOC) - Progression Note  ? ? ?Patient Details  ?Name: Kayla Gray ?MRN: [id number] ?Date of Birth: 05/06/1936 ? ?Transition of Care (TOC) CM/SW Contact  ?Hatsue Sime A Shylie Polo, LCSW ?Phone Number: ?12/25/2021, 11:07 AM ? ?Clinical Narrative:   Per CIR pt can admit today.  ?Spoke with Marley with CIR and there is not a clean bed yet for pt she will let CSW know when it is ready and CSW will set up transport. ? ? ?Expected Discharge Plan: [place] ?Barriers to Discharge: Continued Medical Work up ? ?Expected Discharge Plan and Services ?Expected Discharge Plan: [place] ?  ?Discharge Planning Services: CM Consult ?Post Acute Care Choice: IP Rehab ?Living arrangements for the past 2 months: [place] ?Expected Discharge Date: 12/25/21               ?DME Arranged: N/A ?DME Agency: NA ?  ?  ?  ?  ?  ?  ?  ?  ? ? ?Social Determinants of Health (SDOH) Interventions ?  ? ?Readmission Risk Interventions ?Readmission Risk Prevention Plan 12/20/2021  ?Transportation Screening Complete  ?PCP or Specialist Appt within 5-7 Days Complete  ?Home Care Screening Complete  ?Medication Review (RN CM) Complete  ?Some recent data might be hidden  ? ? ?

## 2021-12-25 NOTE — Telephone Encounter (Signed)
-----   Message from Rise Mu, PA-C sent at 12/25/2021 10:34 AM EDT ----- ?Pam, ? ?Please mail a Zio AT to this patient (it could not be placed while inpatient). Diagnosis is syncope. She will need follow up with APP or Dr. [name] once [facility] has resulted.  ? ?Thanks,  ? ?Ryan ? ?

## 2021-12-25 NOTE — Progress Notes (Signed)
PMR Admission Coordinator Pre-Admission Assessment   Patient: Kayla Gray is an 86 y.o., female MRN: [id number] DOB: 26-May-1936 Height: '5\' 2"'  (157.5 cm) Weight: 76.7 kg   Insurance Information HMO:     PPO: yes     PCP:      IPA:      80/20:      OTHER:  PRIMARY: [employer]      Policy#: [id number]      Subscriber: pt CM Name: Lynelle Smoke      Phone#: [phone]     Fax#: epic access Pre-Cert#: [id number] auth for CIR from [place] with [employer] for 7 days.  They have epic access for updates      Employer:  Benefits:  Phone #:      Name:  Eff. Date: 10/15/21     Deduct: $0      Out of Pocket Max: $3200 (met $25)      Life Max:  CIR: $295/day for days 1-6, $0/day for days 7+      SNF: 20 full days Outpatient:      Co-Pay: $15 Home Health: 100%      Co-Pay:  DME: 80%     Co-Ins: 20% Providers:   SECONDARY:       Policy#:      Phone#:    Development worker, community:       Phone#:    The Actuary for patients in Inpatient Rehabilitation Facilities with attached Privacy Act [place] Records was provided and verbally reviewed with: Family   Emergency Contact Information Contact Information       Name Relation Home Work Mobile    Scardina,Harold Spouse [phone]        [place]) Raynelle Jan Daughter [phone]        Dixon,Shirley Sister [phone]   [phone]           Current Medical History  Patient Admitting Diagnosis: SDH s/p crani   History of Present Illness: Pt is a 86 y/o female presenting to [facility] on 12/18/21 after a fall.  +N/V in ED.  Imaging revealed acute subdural hematoma measuring up to 1.3 cm with substantial leftward midline shift up to 1 cm with partial effacement of the right lateral ventricle.  She also had moderate sized occipital hematoma.  Neurosurgery consulted and recommended urgent craniotomy for decompression.  Post op course complicated by worsening neurologic status on POD 1.  Extensive workup negative for explanatory  cause and pt recovered significantly with adjustment in pain medication.  Syncopal workup negative and ZIO patch was recommended.  Therapy ongoing and patient was recommended for CIR.    Complete NIHSS TOTAL: 2   Patient's medical record from [facility] has been reviewed by the rehabilitation admission coordinator and physician.   Past Medical History      Past Medical History:  Diagnosis Date   Aortic stenosis     Bilateral cataracts     Bleeding nose      Thurs night (09/08/15) and Fri (09/09/15) left side.Bleeding didn't last long   Breast cancer ([place]) 1999    right breast lumpectomy and rad tx   Chronic diastolic (congestive) heart failure (HCC)     Depression     Diverticulosis     Gastroesophageal reflux disease     Heart murmur     Hyperlipidemia     Hypertension     Iron deficiency anemia     Obstructive sleep apnea     Osteoarthritis  Osteopenia     Peptic ulcer disease     Personal history of radiation therapy     S/P aortic valve replacement with bioprosthetic valve 09/16/2015    23 mm Edwards Intuity Elite bioprosthetic tissue valve   Skin cancer 2020   Stroke ([place])      11/14/13   Supraventricular tachycardia (HCC)     Syncope and collapse     Urinary tract infection        Has the patient had major surgery during 100 days prior to admission? Yes   Family History   family history includes Breast cancer in her maternal aunt; Breast cancer (age of onset: 64) in her daughter; Heart failure in her mother.   Current Medications   Current Facility-Administered Medications:    acetaminophen (TYLENOL) tablet 650 mg, 650 mg, Oral, Q4H PRN, 650 mg at 12/23/21 1348 **OR** acetaminophen (TYLENOL) suppository 650 mg, 650 mg, Rectal, Q4H PRN, Loleta Dicker, PA   bisacodyl (DULCOLAX) suppository 10 mg, 10 mg, Rectal, Daily PRN, Loleta Dicker, PA   chlorhexidine (PERIDEX) 0.12 % solution 15 mL, 15 mL, Mouth Rinse, BID, Lanney Gins, Fuad, MD, 15 mL at 12/25/21 0901    Chlorhexidine Gluconate Cloth 2 % PADS 6 each, 6 each, Topical, Q0600, Meade Maw, MD, 6 each at 12/25/21 0502   enoxaparin (LOVENOX) injection 40 mg, 40 mg, Subcutaneous, Q24H, Loleta Dicker, [place], 40 mg at 12/24/21 2051   ezetimibe (ZETIA) tablet 10 mg, 10 mg, Oral, Daily, Loleta Dicker, PA, 10 mg at 12/25/21 0839   insulin aspart (novoLOG) injection 0-9 Units, 0-9 Units, Subcutaneous, TID WC, Benita Gutter, RPH, 2 Units at 12/23/21 1250   labetalol (NORMODYNE) injection 10-40 mg, 10-40 mg, Intravenous, Q10 min PRN, Loleta Dicker, PA, 20 mg at 12/20/21 1110   levETIRAcetam (KEPPRA) IVPB 500 mg/100 mL premix, 500 mg, Intravenous, Q12H, Loleta Dicker, PA, Last Rate: 400 mL/hr at 12/25/21 0838, 500 mg at 12/25/21 0838   losartan (COZAAR) tablet 25 mg, 25 mg, Oral, Daily, Lorella Nimrod, MD, 25 mg at 12/25/21 4970   MEDLINE mouth rinse, 15 mL, Mouth Rinse, q12n4p, Aleskerov, Fuad, MD, 15 mL at 12/24/21 1622   metoprolol tartrate (LOPRESSOR) tablet 25 mg, 25 mg, Oral, BID, Loleta Dicker, PA, 25 mg at 12/25/21 2637   multivitamin with minerals tablet 1 tablet, 1 tablet, Oral, Daily, Loleta Dicker, PA, 1 tablet at 12/25/21 0839   naloxone (NARCAN) injection 0.08 mg, 0.08 mg, Intravenous, PRN, Loleta Dicker, PA   ondansetron [facility]) tablet 4 mg, 4 mg, Oral, Q4H PRN, 4 mg at 12/18/21 2102 **OR** ondansetron (ZOFRAN) injection 4 mg, 4 mg, Intravenous, Q4H PRN, Loleta Dicker, PA, 4 mg at 12/20/21 8588   oxybutynin (DITROPAN-XL) 24 hr tablet 5 mg, 5 mg, Oral, Daily, Loleta Dicker, PA, 5 mg at 12/25/21 0839   pantoprazole (PROTONIX) EC tablet 40 mg, 40 mg, Oral, Daily, Benita Gutter, RPH, 40 mg at 12/25/21 5027   PARoxetine (PAXIL) tablet 20 mg, 20 mg, Oral, Daily, Loleta Dicker, PA, 20 mg at 12/25/21 0839   polyethylene glycol (MIRALAX / GLYCOLAX) packet 17 g, 17 g, Oral, Daily PRN, Loleta Dicker, PA   promethazine (PHENERGAN) 12.5 mg in sodium  chloride 0.9 % 50 mL IVPB, 12.5 mg, Intravenous, Q6H PRN, Tukov-Yual, Magdalene S, NP, Stopped at 12/19/21 1021   senna (SENOKOT) tablet 8.6 mg, 1 tablet, Oral, BID, Loleta Dicker, PA, 8.6 mg at 12/25/21 [phone]  Patients Current Diet:  Diet Order                  DIET SOFT Room service appropriate? Yes; Fluid consistency: Thin  Diet effective now                         Precautions / Restrictions Precautions Precautions: Fall Restrictions Weight Bearing Restrictions: No    Has the patient had 2 or more falls or a fall with injury in the past year? Yes   Prior Activity Level Limited Community (1-2x/wk): still driving about 1x/week, needed cane/walker but didn't use   Prior Functional Level Self Care: Did the patient need help bathing, dressing, using the toilet or eating? Independent   Indoor Mobility: Did the patient need assistance with walking from room to room (with or without device)? Independent   Stairs: Did the patient need assistance with internal or external stairs (with or without device)? Independent   Functional Cognition: Did the patient need help planning regular tasks such as shopping or remembering to take medications? Needed some help   Patient Information Are you of Hispanic, Latino/a,or Spanish origin?: A. No, not of Hispanic, Latino/a, or Spanish origin What is your race?: A. White Do you need or want an interpreter to communicate with a doctor or health care staff?: 0. No   Patient's Response To:  Health Literacy and Transportation Is the patient able to respond to health literacy and transportation needs?: No Health Literacy - How often do you need to have someone help you when you read instructions, pamphlets, or other written material from your doctor or pharmacy?: Patient unable to respond In the past 12 months, has lack of transportation kept you from medical appointments or from getting medications?: No (per daughter) In the past 12 months,  has lack of transportation kept you from meetings, work, or from getting things needed for daily living?: No (per daughter)   Development worker, international aid / [place]: Grab bars - tub/shower, Toilet riser, Radio producer - single point, Conservation officer, nature (2 wheels), BSC/3in1   Prior Device Use: Indicate devices/aids used by the patient prior to current illness, exacerbation or injury?  Per family no device used.    Current Functional Level Cognition   Arousal/Alertness: Awake/alert Overall Cognitive Status: Impaired/Different from baseline Orientation Level: Oriented to person, Oriented to place Following Commands: Follows one step commands with increased time Safety/Judgement: Decreased awareness of safety, Decreased awareness of deficits General Comments: Pt requires MOD cues for safety with mobility. MOD cueing for safety with med task activity with increased assist required to follow multi-step directions on pill bottles. Attention: Selective Selective Attention: Impaired Selective Attention Impairment: Verbal basic, Functional basic Memory: Impaired Memory Impairment: Retrieval deficit, Decreased recall of new information, Decreased short term memory Decreased Short Term Memory: Verbal basic, Functional basic Awareness: Impaired Awareness Impairment: Intellectual impairment, Emergent impairment Problem Solving: Impaired Problem Solving Impairment: Verbal complex, Functional complex Executive Function:  (all impaired by lower level deficits) Behaviors: Poor frustration tolerance Safety/Judgment: Impaired [employer] Scales of Cognitive Functioning: Confused/appropriate    Extremity Assessment (includes Sensation/Coordination)   Upper Extremity Assessment: Generalized weakness (grossly at least 3/5 in all movements. Sensation symmetrical) RUE Sensation: WNL RUE Coordination: decreased fine motor LUE Coordination: decreased fine motor  Lower Extremity Assessment: Generalized  weakness     ADLs   Overall ADL's : Needs assistance/impaired Eating/Feeding: Supervision/ safety, Set up, Sitting Eating/Feeding Details (indicate cue type and reason): able  to bring cup to mouth and drink via straw Toilet Transfer: Minimal assistance, Min guard, BSC/3in1, Rolling walker (2 wheels) Toilet Transfer Details (indicate cue type and reason): Close SBA to MIN A during STS during transfer to [facility] placed over room toilet. Pt rquires significant increased time/effort to perform functional mobility during session. Moderate multimodal cueing provided t/o session to alert to obstacles in room/safety during session. Toileting- Water quality scientist and Hygiene: Min guard, Sit to/from stand, Cueing for safety, With adaptive equipment Toileting - Clothing Manipulation Details (indicate cue type and reason): Close SBA for management of mesh underwear and peri-care after using commode. Cueing for safety during task. Close SBA to MIN A for standing hand washing at sink. General ADL Comments: Anticipate increased assist required without use of RW (pt baseline) during functional tasks.     Mobility   Overal bed mobility: Needs Assistance Bed Mobility: Supine to Sit Supine to sit: Supervision, HOB elevated (significantly extra time & use of bed rails, cuing to come to sitting on EOB with BLE feet on floor) Sit to supine: Min guard General bed mobility comments: Pt received sitting in recliner     Transfers   Overall transfer level: Needs assistance Equipment used: Rolling walker (2 wheels) Transfers: Sit to/from Stand, Bed to chair/wheelchair/BSC Sit to Stand: Min assist (pt completes multiple sit>stand from EOB but reaches out for support on counter; is able to complete sit>stand from recliner with close supervision x 1 occasion) Bed to/from chair/wheelchair/BSC transfer type:: Step pivot Step pivot transfers: Min assist, Min guard (bed>BSC with CGA<>Min assist with BUE support on bed/armrests  of BSC) General transfer comment: pt stood 1 x EOB with mod assist + bed height elevated. pt stood ~ 5 sec prior to reporting" I dont feel well." Pt was returned to bed and repositioned. unable to state what didnt feel well prior to falling back to sleep.     Ambulation / Gait / Stairs / Wheelchair Mobility   Ambulation/Gait Ambulation/Gait assistance: Herbalist (Feet): 15 Feet Assistive device: None Gait Pattern/deviations: Decreased step length - right, Decreased step length - left, Decreased stride length, Decreased dorsiflexion - right, Decreased dorsiflexion - left, Wide base of support, Trunk flexed General Gait Details: Guarded gait, cuing to not hold to furniture in room; pt reports feeling proud of herself for walking today Gait velocity: significantly decreased     Posture / Balance Dynamic Sitting Balance Sitting balance - Comments: supervision static sitting, reaches for counter for support when leaning over to attempt to unthread soiled underwear off BLE Balance Overall balance assessment: Needs assistance Sitting-balance support: No upper extremity supported, Feet supported Sitting balance-Leahy Scale: Fair Sitting balance - Comments: supervision static sitting, reaches for counter for support when leaning over to attempt to unthread soiled underwear off BLE Standing balance support: No upper extremity supported, During functional activity Standing balance-Leahy Scale: Poor Standing balance comment: Requires increased assist with standing balance without UE support on RW. Standardized Balance Assessment Standardized Balance Assessment : Berg Balance Test Berg Balance Test Sit to Stand: Able to stand using hands after several tries Standing Unsupported: Able to stand 2 minutes with supervision Sitting with Back Unsupported but Feet Supported on Floor or Stool: Able to sit 2 minutes under supervision Stand to Sit: Sits independently, has uncontrolled  descent Transfers: Able to transfer with verbal cueing and /or supervision Standing Unsupported with Eyes Closed: Able to stand 10 seconds with supervision Standing Ubsupported with Feet Together: Needs help to attain position  and unable to hold for 15 seconds From Standing, Reach Forward with Outstretched Arm: Loses balance while trying/requires external support From Standing Position, Pick up Object from Floor: Unable to try/needs assist to keep balance From Standing Position, Turn to Look Behind Over each Shoulder: Needs assist to keep from losing balance and falling Turn 360 Degrees: Needs assistance while turning Standing Unsupported, Alternately Place Feet on Step/Stool: Needs assistance to keep from falling or unable to try Standing Unsupported, One Foot in Front: Loses balance while stepping or standing Standing on One Leg: Unable to try or needs assist to prevent fall Total Score: 14     Special needs/care consideration Continuous Drip IV  keppra, Skin scalp incision, and Diabetic management yes    Previous Home Environment (from acute therapy documentation) Living Arrangements: Spouse/significant other  Lives With: Spouse Available Help at Discharge:  (limit assistance by family) Type of Home: House Home Layout: Able to live on main level with bedroom/bathroom Home Access: Stairs to enter Entrance Stairs-Rails: Right Entrance Stairs-Number of Steps: 4 Bathroom Shower/Tub: Chiropodist: Standard Additional Comments: Per past chart history: Pt utilizes SPC in community and no AD at home   Discharge Living Setting Plans for Discharge Living Setting: Patient's home, Lives with (comment) (family will come stay at discharge) Type of Home at Discharge: House Discharge Home Layout: One level Discharge Home Access: Stairs to enter Entrance Stairs-Rails: Right, Left Entrance Stairs-Number of Steps: 2 (through back) Discharge Bathroom Shower/Tub: Tub/shower  unit Discharge Bathroom Toilet: Standard Discharge Bathroom Accessibility: Yes How Accessible: Accessible via walker Does the patient have any problems obtaining your medications?: No   Social/Family/Support Systems Patient Roles: Spouse Anticipated Caregiver: Nolon Lennert (dtr), Enid Derry (sister), Joneen Boers (spouse) Anticipated Caregiver's Contact Information: [name] is primary contact [phone] Ability/Limitations of Caregiver: [name] is undergoing tx for cancer, but organizing care for pt; Joneen Boers is almost 62 and difficulty with mobility and cognition Caregiver Availability: 24/7 Discharge Plan Discussed with Primary Caregiver: Yes Is Caregiver In Agreement with Plan?: Yes Does Caregiver/Family have Issues with Lodging/Transportation while Pt is in Rehab?: No   Goals Patient/Family Goal for Rehab: PT/OT/SLP supervision to min  assist Expected length of stay: 12-14 days Additional Information: frequent falls at home Pt/Family Agrees to Admission and willing to participate: Yes Program Orientation Provided & Reviewed with Pt/Caregiver Including Roles  & Responsibilities: Yes  Barriers to Discharge: Insurance for SNF coverage   Decrease burden of Care through IP rehab admission: n/a   Possible need for SNF placement upon discharge: no   Patient Condition: I have reviewed medical records from [facility], spoken with CM, and patient, spouse, and daughter. I discussed via phone for inpatient rehabilitation assessment.  Patient will benefit from ongoing PT, OT, and SLP, can actively participate in 3 hours of therapy a day 5 days of the week, and can make measurable gains during the admission.  Patient will also benefit from the coordinated team approach during an Inpatient Acute Rehabilitation admission.  The patient will receive intensive therapy as well as Rehabilitation physician, nursing, social worker, and care management interventions.  Due to bladder management, bowel management, safety,  skin/wound care, disease management, medication administration, pain management, and patient education the patient requires 24 hour a day rehabilitation nursing.  The patient is currently min to mod assist with mobility and basic ADLs.  Discharge setting and therapy post discharge at home with home health is anticipated.  Patient has agreed to participate in the Acute Inpatient Rehabilitation Program and will  admit today.   Preadmission Screen Completed By:  Michel Santee, PT, DPT 12/25/2021 10:35 AM ______________________________________________________________________   Discussed status with Dr. Naaman Plummer on 12/25/21  at 10:46 AM  and received approval for admission today.   Admission Coordinator:  Michel Santee, PT, DPT time 10:46 AM Sudie Grumbling 12/25/21     Assessment/Plan: Diagnosis: right SDH after fall Does the need for close, 24 hr/day Medical supervision in concert with the patient's rehab needs make it unreasonable for this patient to be served in a less intensive setting? Yes Co-Morbidities requiring supervision/potential complications: post-op, AS, breast cancer, cdCHF Due to bladder management, bowel management, safety, skin/wound care, disease management, medication administration, pain management, and patient education, does the patient require 24 hr/day rehab nursing? Yes Does the patient require coordinated care of a physician, rehab nurse, PT, OT, and SLP to address physical and functional deficits in the context of the above medical diagnosis(es)? Yes Addressing deficits in the following areas: balance, endurance, locomotion, strength, transferring, bowel/bladder control, bathing, dressing, feeding, grooming, toileting, cognition, speech, and psychosocial support Can the patient actively participate in an intensive therapy program of at least 3 hrs of therapy 5 days a week? Yes The potential for patient to make measurable gains while on inpatient rehab is excellent Anticipated  functional outcomes upon discharge from inpatient rehab: supervision and min assist PT, supervision and min assist OT, supervision and min assist SLP Estimated rehab length of stay to reach the above functional goals is: 12-14 days Anticipated discharge destination: Home 10. Overall Rehab/Functional Prognosis: excellent     MD Signature: Meredith Staggers, MD, [place] Director Rehabilitation Services 12/25/2021

## 2021-12-25 NOTE — Progress Notes (Signed)
Pt arrived to 4w18 from [facility] at 3 p.m.Marland Kitchen Pt A&Ox3. Pt welcomed by Dr. Naaman Plummer, oriented to unit by nurse. Pt expressed understanding. Pt denies any chest pain, SOB, or pain in other areas. Skin intact. Needs mets, call light within reach, bed in lowest setting. ?

## 2021-12-25 NOTE — Care Management Important Message (Signed)
Important Message ? ?Patient Details  ?Name: Kayla Gray ?MRN: [id number] ?Date of Birth: 12-Oct-1936 ? ? ?Medicare Important Message Given:  Yes ? ? ? ? ?[name] A Rayanna Matusik ?12/25/2021, 11:08 AM ?

## 2021-12-25 NOTE — Progress Notes (Signed)
Inpatient Rehab Admissions Coordinator:   ? ?I have insurance approval and a bed available for pt to admit to CIR today.  Pt and family wish to pursue.  Cooper Render, PA-C, in agreement.  Will let pt/family and TOC team know.  ? ?Shann Medal, PT, DPT ?Admissions Coordinator ?[phone] ?12/25/21  ?10:49 AM   ?

## 2021-12-25 NOTE — Progress Notes (Signed)
Occupational Therapy Treatment ?Patient Details ?Name: Kayla Gray ?MRN: [id number] ?DOB: 1936-02-16 ?Today's Date: 12/25/2021 ? ? ?History of present illness Pt is a 86 y.o. female who presents to the ED after falling and Pt report of "striking the back of her head". Admitted for Subdural hematoma s/p R craniotomy for evacuation of hematoma (3/6). PmHx: A-Fib, History of Cancer, CHF, Depression, HTN, HLD, OA, GERD. ?  ?OT comments ? Ms Kerins was seen for OT treatment on this date. Upon arrival to room pt seated EOB eating breakfast with family in room, pt agreeable to tx. Pt requires MIN A + HHA for BSC t/f and perihygiene. MIN A for clothing mgmt - assist to pull pants up/down over rear. CGA + MIN cues for hand washing and tooth brushing stnading sink side. Requires cues to use LUE for assistance with feeding. Noted difficulty throughout maintaining grasp with L hand, drops tooth paste and knife. Encouraged to complete LUE theraputty, reviewed exercises. Completed gross grip loop, lateral pinch, 3pt. pinch, gross digit extension, digit extension table spread, and lumbical ex. Pt making good progress toward goals. Pt continues to benefit from skilled OT services to maximize return to PLOF and minimize risk of future falls, injury, caregiver burden, and readmission. Will continue to follow POC. Discharge recommendation remains appropriate.  ?  ? ?Recommendations for follow up therapy are one component of a multi-disciplinary discharge planning process, led by the attending physician.  Recommendations may be updated based on patient status, additional functional criteria and insurance authorization. ?   ?Follow Up Recommendations ? Acute inpatient rehab (3hours/day)  ?  ?Assistance Recommended at Discharge Frequent or constant Supervision/Assistance  ?Patient can return home with the following ? Assistance with cooking/housework;Help with stairs or ramp for entrance;A little help with walking and/or transfers;A  little help with bathing/dressing/bathroom;Direct supervision/assist for medications management;Assist for transportation ?  ?Equipment Recommendations ? Other (comment) (defer)  ?  ?Recommendations for Other Services   ? ?  ?Precautions / Restrictions Precautions ?Precautions: Fall ?Restrictions ?Weight Bearing Restrictions: No  ? ? ?  ? ?Mobility Bed Mobility ?Overal bed mobility: Needs Assistance ?Bed Mobility: Sit to Supine ?  ?  ?  ?Sit to supine: Min guard ?  ?  ?  ? ?Transfers ?Overall transfer level: Needs assistance ?Equipment used: 1 person hand held assist ?Transfers: Sit to/from Stand ?Sit to Stand: Min assist ?  ?  ?  ?  ?  ?General transfer comment: cues to sequence ?  ?  ?Balance Overall balance assessment: Needs assistance ?Sitting-balance support: No upper extremity supported, Feet supported ?Sitting balance-Leahy Scale: Good ?  ?  ?Standing balance support: No upper extremity supported, During functional activity ?Standing balance-Leahy Scale: Fair ?  ?  ?  ?  ?  ?  ?  ?  ?  ?  ?  ?  ?   ? ?ADL either performed or assessed with clinical judgement  ? ?ADL Overall ADL's : Needs assistance/impaired ?  ?  ?  ?  ?  ?  ?  ?  ?  ?  ?  ?  ?  ?  ?  ?  ?  ?  ?  ?General ADL Comments: MIN A + HHA for BSC t/f and perihygiene. MIN A for clothing mgmt - assist to pull on/off over rear. CGA + MIN cues for hand washing and tooth brushing stnading sink side. ?  ? ? ? ?Cognition Arousal/Alertness: Awake/alert ?Behavior During Therapy: Flat affect ?Overall Cognitive Status: Impaired/Different  from baseline ?Area of Impairment: Memory, Safety/judgement, Problem solving ?  ?  ?  ?  ?  ?  ?  ?  ?  ?  ?Memory: Decreased short-term memory ?Following Commands: Follows multi-step commands with increased time ?Safety/Judgement: Decreased awareness of safety, Decreased awareness of deficits ?  ?Problem Solving: Slow processing, Difficulty sequencing, Requires verbal cues ?  ?  ?  ?   ?   ?   ?   ? ? ?Pertinent Vitals/ Pain        Pain Assessment ?Pain Assessment: No/denies pain ? ? ?Frequency ? Min 4X/week  ? ? ? ? ?  ?Progress Toward Goals ? ?OT Goals(current goals can now be found in the care plan section) ? Progress towards OT goals: Progressing toward goals ? ?Acute Rehab OT Goals ?Patient Stated Goal: to go to inpatient rehab ?OT Goal Formulation: With patient/family ?Time For Goal Achievement: 01/03/22 ?Potential to Achieve Goals: Good ?ADL Goals ?Pt Will Perform Grooming: with supervision;sitting ?Pt Will Transfer to Toilet: with min guard assist;stand pivot transfer;bedside commode ?Pt Will Perform Toileting - Clothing Manipulation and hygiene: with min assist;sitting/lateral leans  ?Plan Discharge plan remains appropriate;Frequency remains appropriate   ? ?Co-evaluation ? ? ?   ?  ?  ?  ?  ? ?  ?AM-PAC OT "6 Clicks" Daily Activity     ?Outcome Measure ? ? Help from another person eating meals?: A Little ?Help from another person taking care of personal grooming?: A Little ?Help from another person toileting, which includes using toliet, bedpan, or urinal?: A Little ?Help from another person bathing (including washing, rinsing, drying)?: A Lot ?Help from another person to put on and taking off regular upper body clothing?: A Little ?Help from another person to put on and taking off regular lower body clothing?: A Lot ?6 Click Score: 16 ? ?  ?End of Session   ? ?OT Visit Diagnosis: Unsteadiness on feet (R26.81);Other symptoms and signs involving the nervous system (R29.898);History of falling (Z91.81) ?  ?Activity Tolerance Patient tolerated treatment well ?  ?Patient Left in bed;with call bell/phone within reach;with bed alarm set;with family/visitor present ?  ?Nurse Communication   ?  ? ?   ? ?Time: 1130-1203 ?OT Time Calculation (min): 33 min ? ?Charges: OT General Charges ?$OT Visit: 1 Visit ?OT Treatments ?$Self Care/Home Management : 8-22 mins ?$Therapeutic Exercise: 8-22 mins ? ?Dessie Coma, M.S. OTR/L  ?12/25/21, 1:07 PM   ?ascom [phone] ? ?

## 2021-12-25 NOTE — Assessment & Plan Note (Signed)
Blood pressure with some improvement after adding losartan.  ? Patient needs improved control of blood pressure due to recent subdural hemorrhage requiring craniotomy and evacuation. ?-Continue losartan ?-Continue with metoprolol ?-Monitor blood pressure and titrate doses as needed ?

## 2021-12-25 NOTE — Telephone Encounter (Signed)
Attempted to schedule vm full.   ? ?If patient calls back pam needs to discuss POC after making fu  ?

## 2021-12-25 NOTE — Progress Notes (Signed)
Requested to place a Zio patch for syncope. I have messaged our office and they will mail out a Zio patch to her house. She will be going to inpatient rehab from [facility], and her husband will bring the Zio to the patient to have this applied while at rehab. All questions were answered.  ?

## 2021-12-25 NOTE — Progress Notes (Signed)
Attending Progress Note ? ?History: Kayla Gray is s/p right craniotomy for SDH evacuation  ? ?POD7: NAEO ? ?24-hour events: Kayla Gray is now postop day 6 from her subdural evacuation.  She ate breakfast but has decreased appetite. She is having no pain. She did get out of bed yesterday.  ? ?POD5: She appears stable overall.  She is having no new complaints. ? ?POD4: NAEO. ?POD3: NAEO ?POD2: AMS yesterday. Head CT, MRI, and EEG without explanation. Improved with holding narcotics. Pt without complaints this morning ?POD1: Complaints of headache overnight. Had CT head showing improved shift. Pt has been drowsy since 4 am.  ? ?Physical Exam: ?Vitals:  ? 12/24/21 2325 12/25/21 0518  ?BP: (!) 148/78 (!) 159/72  ?Pulse: (!) 102 81  ?Resp: 18 17  ?Temp: 98.6 ?F (37 ?C) 98.1 ?F (36.7 ?C)  ?SpO2: 94% 95%  ? ? ?Resting comfortably. Opens eyes to voice ?Oriented to person and year.  ?MAEW ?Incision c/d/I with staples in place ? ?Data: ? ?Recent Labs  ?Lab 12/18/21 ?1533 12/19/21 ?0706 12/20/21 ?0725  ?NA 138 139 140  ?K 3.8 3.8 3.6  ?CL 106 103 106  ?CO2 '27 24 27  '$ ?BUN 18 15 24*  ?CREATININE 0.91 0.78 0.95  ?GLUCOSE 120* 240* 112*  ?CALCIUM 8.9 8.6* 8.6*  ? ? ?Recent Labs  ?Lab 12/19/21 ?0706  ?AST 61*  ?ALT 37  ?ALKPHOS 50  ? ?  ? Recent Labs  ?Lab 12/18/21 ?1533 12/19/21 ?0865 12/20/21 ?0725  ?WBC 8.9 9.6 9.7  ?HGB 11.8* 10.7* 9.5*  ?HCT 37.5 33.3* 30.5*  ?PLT 155 151 124*  ? ? ?Recent Labs  ?Lab 12/18/21 ?1533  ?INR 1.1  ? ?  ?   ? ? ?Other tests/results:  ?CT head 12/19/21 ?IMPRESSION: ?1. Postoperative changes from interval right sided craniotomy for ?subdural evacuation. Residual small volume subdural hemorrhage along ?the falx and right tentorium measuring up to 6 mm in maximal ?thickness. Improved mass effect with trace 2 mm right-to-left shift. ?No hydrocephalus or trapping. ?2. No other new acute intracranial abnormality. ?Electronically Signed ?  By: Jeannine Boga M.D. ?  On: 12/19/2021 00:50 ? ?MRI  brain 12/19/21 ?IMPRESSION: ?1. Small region of acute ischemia in the right superior frontal ?gyrus near the vertex corresponding to the finding on the same-day ?head CT, and punctate acute infarct in the right insular cortex. ?2. Stable postsurgical changes reflecting right frontoparietal ?craniotomy for subdural hematoma evacuation. Residual subdural blood ?over the right cerebral convexity, layering along the falx ?posteriorly, and right tentorial leaflet is overall similar to the ?same-day head CT, with minimal mass effect and no measurable midline ?shift. ?3. Subdural collections are also seen overlying the bilateral ?cerebellar hemispheres extending to the craniocervical junction. ?Please also see the separately dictated cervical spine MRI. ?  ?Electronically Signed ?  By: Valetta Mole M.D. ?  On: 12/19/2021 15:08 ? ?MRI C-spine 12/19/21 ?IMPRESSION: ?Extramedullary hemorrhage within the upper cervical spinal canal ?(extending from the craniocervical junction to the C5 level). ?Resultant mild-to-moderate narrowing of the upper cervical spinal ?canal (without spinal cord mass effect). ?  ?Cervical spondylosis, as outlined and having progressed at multiple ?levels since the prior MRI of 05/01/2017. ?  ?At C6-C7, there is progressive moderate/advanced disc degeneration ?with mild degenerative endplate edema. A posterior disc osteophyte ?complex contributes to progressive moderate spinal canal stenosis. ?Multifactorial severe bilateral neural foraminal narrowing also ?present at this level. ?  ?No more than mild degenerative spinal canal narrowing at the ?remaining  levels. ?  ?Multifactorial moderate left neural foraminal narrowing at C5-C6. ?  ?  ?Electronically Signed ?  By: [name] Simmering D.O. ?  On: 12/19/2021 15:23 ? ?EEG 12/19/21 ?IMPRESSION: ?This study is suggestive of cortical dysfunction arising from right hemisphere, maximal right frontal region consistent with underlying craniotomy and SDH. Additionally there  is moderate diffuse encephalopathy, nonspecific etiology. No seizures were seen throughout the recording. ?  ?Kayla Gray  ? ?Assessment/Plan: ? ?Kayla Gray is a 86 y.o presenting after a fall with a right sided SDH with midline shift and left sided weakness s/p right craniotomy for evacuation ? ?- Started ppx Lovenox  ?- hold all sedative medications ?- continue PT/OT. Considering IPR ?- Okay to shower ?- syncope workup underway-medicine team recommending monitor on discharge, order will need to be placed ?- Dispo pending placement ?- please page neurosurgery with any questions or concerns ? ?Cooper Render PA-C ?Department of Neurosurgery ? ?  ?

## 2021-12-25 NOTE — Discharge Summary (Signed)
Physician Discharge Summary  ?Patient ID: ?Fay Records ?MRN: [id number] ?DOB/AGE: 11-28-1935 86 y.o. ? ?Admit date: 12/18/2021 ?Discharge date: 12/25/2021 ? ?Admission Diagnoses: Subdural Hematoma  ? ?Discharge Diagnoses:  ?Principal Problem: ?  Subdural hematoma ?Active Problems: ?  Syncope ?  Chronic diastolic (congestive) heart failure (HCC) ?  Paroxysmal atrial fibrillation (HCC) ?  Essential hypertension ? ? ?Discharged Condition: fair ? ?Hospital Course:  ?Ommie Wollin is a 86 y.o presenting after a fall resulting in right sided SDH. She underwent evacuation on 12/18/21. Her surgery was uncomplicated. She experienced worsening neurologic status on POD1 resulting in extensive work up including MRI, repeat head CT, and EEG which were negative for explanatory cause. She recovered significantly and remained neurologically stable throughout the remainder of her hospital stay.  ?Internal medicine was consulted for syncopal work up which was negative. An outpatient ZIO monitor was recommended at discharge.  ?She was discharged to inpatient rehab.  ? ?Consults: None ? ?Significant Diagnostic Studies:  ?CT head 12/19/21 ?IMPRESSION: ?1. Postoperative changes from interval right sided craniotomy for ?subdural evacuation. Residual small volume subdural hemorrhage along ?the falx and right tentorium measuring up to 6 mm in maximal ?thickness. Improved mass effect with trace 2 mm right-to-left shift. ?No hydrocephalus or trapping. ?2. No other new acute intracranial abnormality. ?Electronically Signed ?  By: Jeannine Boga M.D. ?  On: 12/19/2021 00:50 ?  ?MRI brain 12/19/21 ?IMPRESSION: ?1. Small region of acute ischemia in the right superior frontal ?gyrus near the vertex corresponding to the finding on the same-day ?head CT, and punctate acute infarct in the right insular cortex. ?2. Stable postsurgical changes reflecting right frontoparietal ?craniotomy for subdural hematoma evacuation. Residual subdural blood ?over  the right cerebral convexity, layering along the falx ?posteriorly, and right tentorial leaflet is overall similar to the ?same-day head CT, with minimal mass effect and no measurable midline ?shift. ?3. Subdural collections are also seen overlying the bilateral ?cerebellar hemispheres extending to the craniocervical junction. ?Please also see the separately dictated cervical spine MRI. ?  ?Electronically Signed ?  By: Valetta Mole M.D. ?  On: 12/19/2021 15:08 ?  ?MRI C-spine 12/19/21 ?IMPRESSION: ?Extramedullary hemorrhage within the upper cervical spinal canal ?(extending from the craniocervical junction to the C5 level). ?Resultant mild-to-moderate narrowing of the upper cervical spinal ?canal (without spinal cord mass effect). ?  ?Cervical spondylosis, as outlined and having progressed at multiple ?levels since the prior MRI of 05/01/2017. ?  ?At C6-C7, there is progressive moderate/advanced disc degeneration ?with mild degenerative endplate edema. A posterior disc osteophyte ?complex contributes to progressive moderate spinal canal stenosis. ?Multifactorial severe bilateral neural foraminal narrowing also ?present at this level. ?  ?No more than mild degenerative spinal canal narrowing at the ?remaining levels. ?  ?Multifactorial moderate left neural foraminal narrowing at C5-C6. ?  ?  ?Electronically Signed ?  By: [name] Simmering D.O. ?  On: 12/19/2021 15:23 ?  ?EEG 12/19/21 ?IMPRESSION: ?This study is suggestive of cortical dysfunction arising from right hemisphere, maximal right frontal region consistent with underlying craniotomy and SDH. Additionally there is moderate diffuse encephalopathy, nonspecific etiology. No seizures were seen throughout the recording. ?  ?[name]  ? ?Treatments: surgery: as above. Please see separately dictated operative report for further details.  ? ?Discharge Exam: ?Blood pressure (!) 153/81, pulse 76, temperature 99.4 ?F (37.4 ?C), resp. rate 16, height '5\' 2"'$  (1.575 m),  weight 76.7 kg, SpO2 96 %. ? ?Resting comfortably. Opens eyes to voice ?Oriented to person  and year.  ?MAEW ?Incision c/d/I with staples in place ?  ? ?Disposition: Discharge disposition: 02-Transferred to [facility] ? ? ? ? ? ? ?Discharge Instructions   ? ? Discharge wound care:   Complete by: As directed ?  ? Patient's staples can be removed on post-op day 10  ? ?  ? ?Allergies as of 12/25/2021   ? ?   Reactions  ? Evista [raloxifene]   ? Sulfa Antibiotics Hives  ? Codeine Hives  ? Nitrofurantoin Rash  ? Oxycodone-acetaminophen Rash  ? Statins Other (See Comments), Rash  ? Leg muscle cramps  ? ?  ? ?  ?Medication List  ?  ? ?STOP taking these medications   ? ?amoxicillin 500 MG tablet ?Commonly known as: AMOXIL ?  ?aspirin EC 81 MG tablet ?  ? ?  ? ?TAKE these medications   ? ?acetaminophen 325 MG tablet ?Commonly known as: TYLENOL ?Take 650 mg by mouth every 6 (six) hours as needed. ?  ?CALCIUM 600 + D PO ?Take by mouth 2 (two) times daily. ?  ?ezetimibe 10 MG tablet ?Commonly known as: ZETIA ?Take 10 mg by mouth daily. ?  ?fluticasone 50 MCG/ACT nasal spray ?Commonly known as: FLONASE ?Place 2 sprays into the nose daily as needed. ?  ?ketoconazole 2 % shampoo ?Commonly known as: NIZORAL ?Apply topically 3 (three) times a week. ?  ?losartan 25 MG tablet ?Commonly known as: COZAAR ?Take 1 tablet (25 mg total) by mouth daily. ?Start taking on: December 26, 2021 ?  ?metoprolol tartrate 25 MG tablet ?Commonly known as: LOPRESSOR ?TAKE 1 TABLET BY MOUTH TWICE A DAY ?  ?multivitamin tablet ?Take 1 tablet by mouth daily. ?  ?oxybutynin 5 MG 24 hr tablet ?Commonly known as: DITROPAN-XL ?Take 5 mg by mouth daily. ?  ?pantoprazole 40 MG tablet ?Commonly known as: PROTONIX ?Take 40 mg by mouth daily as needed. ?  ?PARoxetine 20 MG tablet ?Commonly known as: PAXIL ?Take 20 mg by mouth daily. ?  ? ?  ? ?  ?  ? ? ?  ?Discharge Care Instructions  ?(From admission, onward)  ?  ? ? ?  ? ?  Start     Ordered  ? 12/25/21 0000   Discharge wound care:       ?Comments: Patient's staples can be removed on post-op day 10  ? 12/25/21 1000  ? ?  ?  ? ?  ? ? Follow-up Information   ? ? Loleta Dicker, PA Follow up in 1 week(s).   ?Why: for staple removal. ?Contact information: ?[place] [address] ?[phone] ? ? ?  ?  ? ?  ?  ? ?  ? ? ?Signed: ?Loleta Dicker ?12/25/2021, 10:00 AM ? ? ?

## 2021-12-25 NOTE — Progress Notes (Signed)
?Progress Note ? ? ?Patient: Kayla Gray PYK:[id number] DOB: 1936/07/13 DOA: 12/18/2021     7 ?DOS: the patient was seen and examined on 12/25/2021 ?  ?Brief hospital course: ?Taken from initial consult note. ? ?AVRIEL KANDEL is a 86 y.o. female with medical history significant of AS s/p bovine aortic valve replacement, cancer status post lumpectomy and radiation therapy, chronic diastolic CHF, HTN, paroxysmal SVT, who came to the hospital after 1 syncope episode. ?During that episode patient fell and hit her head.  No prodromal symptoms. ?Fall resulted in a large subdural hematoma with midline shift requiring right-sided craniotomy by neurosurgery.  Patient was admitted under neuro surgery service. ?We will ask for syncopal work-up. ? ?Syncope work-up so far remains inconclusive, telemetry with normal sinus rhythm, 2 very small episodes of SVT, lasting for less than 10 seconds and patient remained asymptomatic.  A1c of 5.7.  Patient does not have any history of diabetes or prediabetes.  No recent illnesses. ? ?Patient has an history of severe aortic stenosis s/p bioprosthetic aortic valve replacement about 3 years ago.  Repeat echocardiogram done today was with normal EF, grade 1 diastolic dysfunction, no regional wall abnormalities and properly functioning bioprosthetic valve without any abnormality noted. ? ?Patient denies any chest pain or shortness of breath. ? ?She was little more somnolent yesterday-became more alert today after decreasing opioids. ? ?3/11: Patient seems to be at her baseline now.  Syncope work-up so far negative.  Patient will need ZIO monitor on discharge, [facility] cardiology has to be notified on the day of discharge. ?Today husband and patient are telling me different story which was not consistent with syncopal episode.  Per patient and husband she was mopping and somehow lost her balance, may be slipped on a wet floor, per patient she never lost her consciousness, able to get herself up  and walk to the recliner and then called her husband who was out of the house at that time.  When husband saw her she appears confused so they called the EMS. ?This is more consistent with mechanical fall. ? ?3/12: Family decided to proceed with CIR, pending insurance authorization. ?Losartan was added for better control of hypertension as blood pressure remained elevated. ? ?3/13: Patient remained stable and is being discharged to inpatient rehab. ?Primary team contacted her cardiologist for a ZIO monitor. ?Rest of the management as advised by primary team. ? ? ? ?Assessment and Plan: ?* Subdural hematoma ?Patient developed large subdural hematoma with a midline shift after hitting her head during the syncopal episode s/p right craniotomy and hematoma evacuation. ?-Being managed by neurosurgery ?-On prophylactic Keppra ? ?Syncope ?Patient denies any prodromal symptoms.  Patient has an history of paroxysmal atrial fibrillation and paroxysmal SVT in the past, there was a concern of cardiac syncope.  Telemetry remains inconclusive. ?-After talking with cardiology yesterday they agreed to put a ZIO monitor on discharge. ?-After talking with patient and husband on 12/23/21 does not look like a syncopal episode. ? ?Chronic diastolic (congestive) heart failure (HCC) ?Appears euvolemic.  Repeat echocardiogram with no other significant abnormality. ?-Continue to monitor ? ?Paroxysmal atrial fibrillation (HCC) ?Remained in sinus rhythm. ?-Continue to monitor ? ?Essential hypertension ?Blood pressure with some improvement after adding losartan.  ? Patient needs improved control of blood pressure due to recent subdural hemorrhage requiring craniotomy and evacuation. ?-Continue losartan ?-Continue with metoprolol ?-Monitor blood pressure and titrate doses as needed ? ? ?Subjective: Patient was seen and examined today.  No new complaints.  She was excited to go to rehab.  Denies any headaches or chest pain. ? ?Physical  Exam: ?Vitals:  ? 12/24/21 1935 12/24/21 2325 12/25/21 0518 12/25/21 0729  ?BP: 126/66 (!) 148/78 (!) 159/72 (!) 153/81  ?Pulse: 87 (!) 102 81 76  ?Resp: '18 18 17 16  '$ ?Temp: 98.4 ?F (36.9 ?C) 98.6 ?F (37 ?C) 98.1 ?F (36.7 ?C) 99.4 ?F (37.4 ?C)  ?TempSrc:      ?SpO2: 97% 94% 95% 96%  ?Weight:      ?Height:      ? ?General.     In no acute distress. ?Pulmonary.  Lungs clear bilaterally, normal respiratory effort. ?CV.  Regular rate and rhythm, no JVD, rub or murmur. ?Abdomen.  Soft, nontender, nondistended, BS positive. ?CNS.  Alert and oriented .  No focal neurologic deficit. ?Extremities.  No edema, no cyanosis, pulses intact and symmetrical. ?Psychiatry.  Judgment and insight appears normal. ? ?Data Reviewed: ?No new labs to be reviewed today ? ?Family Communication: Discussed with patient ? ?Disposition: ?Status is: Inpatient ?Remains inpatient appropriate because: Being discharged to rehab. ? ? Planned Discharge Destination: Rehab ? ?Time spent: 40 minutes ? ?This record has been created using Systems analyst. Errors have been sought and corrected,but may not always be located. Such creation errors do not reflect on the standard of care. ? ?Author: ?Lorella Nimrod, MD ?12/25/2021 11:24 AM ? ?For on call review [email or web address].  ?

## 2021-12-25 NOTE — Discharge Instructions (Signed)
NEUROSURGERY DISCHARGE INSTRUCTIONS ? ?Admission diagnosis: Subdural hematoma [S06.5XAA] ? ?Operative procedure: craniotomy for evacuation of SDH ? ?What to do after you leave the hospital: ? ?Recommended diet: regular diet. Increase protein intake to promote wound healing. ? ?Recommended activity: no lifting, driving, or strenuous exercise for 4 weeks . You should walk multiple times per day ? ?Special Instructions ? ?No straining, no heavy lifting > 10lbs x 4 weeks.  Keep incision area clean and dry. May shower in 2 days. No baths or pools for 6 weeks.  ?Please remove dressing tomorrow, no need to apply a bandage afterwards ? ?You have sutures or staples that will be removed in clinic.  ? ?Please take pain medications as directed. Take a stool softener if on pain medications ? ? ?Please Report any of the following: ?Nausea or Vomiting, Temperature is greater than 101.77F (38.1C) degrees, Dizziness, Abdominal Pain, Difficulty Breathing or Shortness of Breath, Inability to Eat, drink Fluids, or Take medications, Bleeding, swelling, or drainage from surgical incision sites, New numbness or weakness, and Bowel or bladder dysfunction to the neurosurgeon on call at [phone] ? ?Additional Follow up appointments ?Please follow up with Cooper Render PA-C in [facility] as scheduled in 2-3 weeks ? ? ?Please see below for scheduled appointments: ? ?No future appointments. ? ?  ?

## 2021-12-25 NOTE — Progress Notes (Signed)
Inpatient Rehabilitation Admission Medication Review by a Pharmacist ? ?A complete drug regimen review was completed for this patient to identify any potential clinically significant medication issues. ? ?High Risk Drug Classes Is patient taking? Indication by Medication  ?Antipsychotic Yes Compazine- N/V  ?Anticoagulant Yes Lovenox- VTE prophylaxis  ?Antibiotic No   ?Opioid No   ?Antiplatelet No   ?Hypoglycemics/insulin Yes iSS- T2DM  ?Vasoactive Medication Yes Cozaar, Lopressor- Hypertension  ?Chemotherapy No   ?Other Yes Zetia- HLD ?Ditropan- OAB ?Keppra- seizure prophylaxis ?Trazodone- sleep ?Paxil- MDD / anxiety ?Protonix- GERD  ? ? ? ?Type of Medication Issue Identified Description of Issue Recommendation(s)  ?Drug Interaction(s) (clinically significant) ?    ?Duplicate Therapy ?    ?Allergy ?    ?No Medication Administration End Date ?    ?Incorrect Dose ?    ?Additional Drug Therapy Needed ?    ?Significant med changes from prior encounter (inform family/care partners about these prior to discharge).    ?Other ? PTA meds: ?Aspirin Continue PTA meds if and when clinically necessary during CIR admission or at time of discharge, if warranted  ? ? ?Clinically significant medication issues were identified that warrant physician communication and completion of prescribed/recommended actions by midnight of the next day:  No ? ?Time spent performing this drug regimen review (minutes):  30 ? ? ?Ellyson Rarick BS, PharmD, BCPS ?Clinical Pharmacist ?12/25/2021 2:14 PM ? ?

## 2021-12-25 NOTE — H&P (Signed)
Physical Medicine and Rehabilitation Admission H&P     CC: Functional deficits due to SDH     HPI: Kayla Gray is an 86 year old female with history of HTN, iron deficiency anemia, chronic dCHF, breast cancer, SVT who was admitted to [facility] on 12/18/21 after fall (while mopping her floor), striking back of her head with syncope. She was noted to have hematoma on the back of her head, reported HA and was vomiting at admission. CT head done revealing 1.3 cm R-SDH along falx and right tentorium with 1 cm leftward midline shift and moderate size occipital scalp hematoma. She was taken to OR for right crani for evacuation of SDH with placement of SD drain by Dr. Cari Caraway the same day. Post op with lethargy with ongoing HA and EEG done revealing right frontal cortical dysfunction with  moderate diffuse encephalopathy and was negative for seizures.    AMS felt to be due to narcotics which was discontinued and follow up CT head stable post op changes with blood extending along dorsal and ventral aspects of craniocervical junction into upper cervical spine surrounding cord. Cervical MRI done for work up showing extramedullary hemorrhage extending to C5 with mild to moderate narrowing of upper cervical canal without mass effect and moderate advanced disc degeneration C6-C7 with severe bilateral neural foraminal narrowing. MRI Brain showed stable bleed with acute ischemia right superior frontal gyrus and right insular cortex. 2D echo showed EF 60-65%, mild MVR and functioning bioprosthetic AV.  Mentation has improved and cardiology consulted for input due to history of PAF. ZIO patch recommended to rule out cardiac source. BP has been labile and Losartan added for better control. Therapy ongoing and patient limited by weakness, balance deficits and need increased time for processing. CIR recommended due to functional decline.        Review of Systems  Constitutional:  Negative for fever.  HENT:   Negative for hearing loss.   Eyes:  Negative for blurred vision.  Respiratory:  Negative for cough and shortness of breath.   Cardiovascular:  Negative for chest pain and leg swelling.  Gastrointestinal:  Negative for abdominal pain, constipation and heartburn.  Genitourinary:        Nocturia --wears depends at nights  Musculoskeletal:  Negative for back pain, myalgias and neck pain.  Skin:  Negative for rash.  Neurological:  Positive for focal weakness and headaches. Negative for dizziness.          Past Medical History:  Diagnosis Date   Aortic stenosis     Bilateral cataracts     Bleeding nose      Thurs night (09/08/15) and Fri (09/09/15) left side.Bleeding didn't last long   Breast cancer ([place]) 1999    right breast lumpectomy and rad tx   Chronic diastolic (congestive) heart failure (HCC)     Depression     Diverticulosis     Gastroesophageal reflux disease     Heart murmur     Hyperlipidemia     Hypertension     Iron deficiency anemia     Obstructive sleep apnea     Osteoarthritis     Osteopenia     Peptic ulcer disease     Personal history of radiation therapy     S/P aortic valve replacement with bioprosthetic valve 09/16/2015    23 mm Edwards Intuity Elite bioprosthetic tissue valve   Skin cancer 2020   Stroke ([place])      11/14/13   Supraventricular  tachycardia ([place])     Syncope and collapse     Urinary tract infection             Past Surgical History:  Procedure Laterality Date   AORTIC VALVE REPLACEMENT N/A 09/16/2015    Procedure: AORTIC VALVE REPLACEMENT (AVR);  Surgeon: Rexene Alberts, MD;  Location: [facility];  Service: Open Heart Surgery;  Laterality: N/A;   BREAST BIOPSY Right 1999    breast ca radation   BREAST EXCISIONAL BIOPSY Left yrs ago     benign   BREAST LUMPECTOMY Right 1999    with radiation   CARDIAC CATHETERIZATION N/A 08/17/2015    Procedure: Left Heart Cath and Coronary Angiography;  Surgeon: Minna Merritts, MD;  Location: [facility];  Service: Cardiovascular;  Laterality: N/A;   CATARACT EXTRACTION, BILATERAL       CHOLECYSTECTOMY       CRANIOTOMY Right 12/18/2021    Procedure: CRANIOTOMY HEMATOMA EVACUATION SUBDURAL;  Surgeon: Meade Maw, MD;  Location: [facility];  Service: Neurosurgery;  Laterality: Right;   JOINT REPLACEMENT Bilateral      knee replacement   KNEE ARTHROSCOPY        right   KNEE ARTHROSCOPY        left   SKIN CANCER EXCISION   2020   TEE WITHOUT CARDIOVERSION N/A 09/16/2015    Procedure: TRANSESOPHAGEAL ECHOCARDIOGRAM (TEE);  Surgeon: Rexene Alberts, MD;  Location: [facility];  Service: Open Heart Surgery;  Laterality: N/A;   TONSILLECTOMY       VAGINAL HYSTERECTOMY               Family History  Problem Relation Age of Onset   Heart failure Mother     Breast cancer Daughter 94   Breast cancer Maternal Aunt        Social History:  reports that she has never smoked. She has never used smokeless tobacco. She reports that she does not currently use alcohol. She reports that she does not use drugs.          Allergies  Allergen Reactions   Evista [Raloxifene]     Sulfa Antibiotics Hives   Codeine Hives   Nitrofurantoin Rash   Oxycodone-Acetaminophen Rash   Statins Other (See Comments) and Rash      Leg muscle cramps            Medications Prior to Admission  Medication Sig Dispense Refill   aspirin EC 81 MG tablet Take 1 tablet (81 mg total) by mouth daily. 90 tablet 3   Calcium Carbonate-Vitamin D (CALCIUM 600 + D PO) Take by mouth 2 (two) times daily.       ezetimibe (ZETIA) 10 MG tablet Take 10 mg by mouth daily.       fluticasone (FLONASE) 50 MCG/ACT nasal spray Place 2 sprays into the nose daily as needed.       metoprolol tartrate (LOPRESSOR) 25 MG tablet TAKE 1 TABLET BY MOUTH TWICE A DAY 180 tablet 1   Multiple Vitamin (MULTIVITAMIN) tablet Take 1 tablet by mouth daily.       pantoprazole (PROTONIX) 40 MG tablet Take 40 mg by mouth daily as needed.        acetaminophen (TYLENOL) 325 MG tablet Take 650 mg by mouth every 6 (six) hours as needed.       amoxicillin (AMOXIL) 500 MG tablet Take 4 tablets (2,000 mg total) by mouth as directed. Take 2 hours before surgery or  dental work (Patient not taking: Reported on 11/06/2021) 4 tablet 1   ketoconazole (NIZORAL) 2 % shampoo Apply topically 3 (three) times a week.       oxybutynin (DITROPAN-XL) 5 MG 24 hr tablet Take 5 mg by mouth daily.       PARoxetine (PAXIL) 20 MG tablet Take 20 mg by mouth daily.              Home: Home Living Family/patient expects to be discharged to:: Private residence Living Arrangements: Spouse/significant other Available Help at Discharge:  (limit assistance by family) Type of Home: House Home Access: Stairs to enter Technical brewer of Steps: 4 Entrance Stairs-Rails: Right Home Layout: Able to live on main level with bedroom/bathroom Bathroom Shower/Tub: Chiropodist: Standard Home Equipment: Grab bars - tub/shower, Toilet riser, Cane - single point, Conservation officer, nature (2 wheels), BSC/3in1 Additional Comments: Per past chart history: Pt utilizes SPC in community and no AD at home  Lives With: Spouse   Functional History: Prior Function Prior Level of Function : Independent/Modified Independent ADLs Comments: Pt and pt's husband report she was independent with all ADLs at baseline   Functional Status:  Mobility: Bed Mobility Overal bed mobility: Needs Assistance Bed Mobility: Supine to Sit Supine to sit: Supervision, HOB elevated (significantly extra time & use of bed rails, cuing to come to sitting on EOB with BLE feet on floor) Sit to supine: Min guard General bed mobility comments: Pt received sitting in recliner Transfers Overall transfer level: Needs assistance Equipment used: Rolling walker (2 wheels) Transfers: Sit to/from Stand, Bed to chair/wheelchair/BSC Sit to Stand: Min assist (pt completes multiple sit>stand from EOB but  reaches out for support on counter; is able to complete sit>stand from recliner with close supervision x 1 occasion) Bed to/from chair/wheelchair/BSC transfer type:: Step pivot Step pivot transfers: Min assist, Min guard (bed>BSC with CGA<>Min assist with BUE support on bed/armrests of BSC) General transfer comment: pt stood 1 x EOB with mod assist + bed height elevated. pt stood ~ 5 sec prior to reporting" I dont feel well." Pt was returned to bed and repositioned. unable to state what didnt feel well prior to falling back to sleep. Ambulation/Gait Ambulation/Gait assistance: Min assist Gait Distance (Feet): 15 Feet Assistive device: None Gait Pattern/deviations: Decreased step length - right, Decreased step length - left, Decreased stride length, Decreased dorsiflexion - right, Decreased dorsiflexion - left, Wide base of support, Trunk flexed General Gait Details: Guarded gait, cuing to not hold to furniture in room; pt reports feeling proud of herself for walking today Gait velocity: significantly decreased   ADL: ADL Overall ADL's : Needs assistance/impaired Eating/Feeding: Supervision/ safety, Set up, Sitting Eating/Feeding Details (indicate cue type and reason): able to bring cup to mouth and drink via straw Toilet Transfer: Minimal assistance, Min guard, BSC/3in1, Rolling walker (2 wheels) Toilet Transfer Details (indicate cue type and reason): Close SBA to MIN A during STS during transfer to [facility] placed over room toilet. Pt rquires significant increased time/effort to perform functional mobility during session. Moderate multimodal cueing provided t/o session to alert to obstacles in room/safety during session. Toileting- Water quality scientist and Hygiene: Min guard, Sit to/from stand, Cueing for safety, With adaptive equipment Toileting - Clothing Manipulation Details (indicate cue type and reason): Close SBA for management of mesh underwear and peri-care after using commode. Cueing for  safety during task. Close SBA to MIN A for standing hand washing at sink. General ADL Comments: Anticipate increased assist required without  use of RW (pt baseline) during functional tasks.   Cognition: Cognition Overall Cognitive Status: Impaired/Different from baseline Arousal/Alertness: Awake/alert Orientation Level: Oriented to person, Oriented to place Year: 2022 Month: March Day of Week: Incorrect Attention: Selective Selective Attention: Impaired Selective Attention Impairment: Verbal basic, Functional basic Memory: Impaired Memory Impairment: Retrieval deficit, Decreased recall of new information, Decreased short term memory Decreased Short Term Memory: Verbal basic, Functional basic Awareness: Impaired Awareness Impairment: Intellectual impairment, Emergent impairment Problem Solving: Impaired Problem Solving Impairment: Verbal complex, Functional complex Executive Function:  (all impaired by lower level deficits) Behaviors: Poor frustration tolerance Safety/Judgment: Impaired [employer] Scales of Cognitive Functioning: Confused/appropriate Cognition Arousal/Alertness: Awake/alert Behavior During Therapy: Flat affect Overall Cognitive Status: Impaired/Different from baseline Area of Impairment: Memory, Following commands, Safety/judgement Orientation Level: Person, Place, Time, Situation Memory: Decreased short-term memory Following Commands: Follows one step commands with increased time Safety/Judgement: Decreased awareness of safety, Decreased awareness of deficits Problem Solving: Slow processing, Decreased initiation, Difficulty sequencing, Requires verbal cues General Comments: Pt requires MOD cues for safety with mobility. MOD cueing for safety with med task activity with increased assist required to follow multi-step directions on pill bottles.   Physical Exam: Blood pressure (!) 153/81, pulse 76, temperature 99.4 F (37.4 C), resp. rate 16, height '5\' 2"'$   (1.575 m), weight 76.7 kg, SpO2 96 %. Physical Exam Vitals and nursing note reviewed.  Constitutional:      Appearance: Normal appearance.  HENT:     Head:     Comments: Right curvilinear incision C/D/I with staples in place.     Right Ear: External ear normal.     Left Ear: External ear normal.     Nose: Nose normal.     Mouth/Throat:     Mouth: Mucous membranes are moist.     Pharynx: Oropharynx is clear.  Eyes:     Pupils: Pupils are equal, round, and reactive to light.  Cardiovascular:     Rate and Rhythm: Normal rate and regular rhythm.     Heart sounds: No murmur heard.   No gallop.  Pulmonary:     Effort: Pulmonary effort is normal. No respiratory distress.     Breath sounds: No wheezing.  Abdominal:     General: Bowel sounds are normal. There is no distension.     Palpations: Abdomen is soft.  Musculoskeletal:        General: No swelling or tenderness. Normal range of motion.     Cervical back: Normal range of motion.     Comments: Well healed old B-TKR incisions.   Skin:    General: Skin is warm and dry.  Neurological:     Mental Status: She is alert and oriented to person, place, and time.     Comments: Pt alert, oriented to name, hospital, month/year, reason she's here. Recalls moments leading up to fall. Recalls events after fall. Normal language, speech. No focal CN abnl. Motor: 4/5 UE prox to distal. RLE 3/5 prox to 4/5 distally. LLE 3- prox to 4- distally. No focal sensory deficits. No obvious ataxia. Normal tone and DTR's  Psychiatric:        Mood and Affect: Mood normal.        Behavior: Behavior normal.      Lab Results Last 48 Hours        Results for orders placed or performed during the hospital encounter of 12/18/21 (from the past 48 hour(s))  Glucose, capillary     Status: Abnormal  Collection Time: 12/23/21 12:20 PM  Result Value Ref Range    Glucose-Capillary 163 (H) 70 - 99 mg/dL      Comment: Glucose reference range applies only to samples  taken after fasting for at least 8 hours.  Glucose, capillary     Status: Abnormal    Collection Time: 12/23/21  3:30 PM  Result Value Ref Range    Glucose-Capillary 106 (H) 70 - 99 mg/dL      Comment: Glucose reference range applies only to samples taken after fasting for at least 8 hours.  Glucose, capillary     Status: None    Collection Time: 12/23/21  7:55 PM  Result Value Ref Range    Glucose-Capillary 71 70 - 99 mg/dL      Comment: Glucose reference range applies only to samples taken after fasting for at least 8 hours.  Glucose, capillary     Status: None    Collection Time: 12/24/21  7:32 AM  Result Value Ref Range    Glucose-Capillary 89 70 - 99 mg/dL      Comment: Glucose reference range applies only to samples taken after fasting for at least 8 hours.  Glucose, capillary     Status: Abnormal    Collection Time: 12/24/21 11:52 AM  Result Value Ref Range    Glucose-Capillary 107 (H) 70 - 99 mg/dL      Comment: Glucose reference range applies only to samples taken after fasting for at least 8 hours.  Glucose, capillary     Status: Abnormal    Collection Time: 12/24/21  4:20 PM  Result Value Ref Range    Glucose-Capillary 114 (H) 70 - 99 mg/dL      Comment: Glucose reference range applies only to samples taken after fasting for at least 8 hours.  Glucose, capillary     Status: Abnormal    Collection Time: 12/24/21  9:00 PM  Result Value Ref Range    Glucose-Capillary 175 (H) 70 - 99 mg/dL      Comment: Glucose reference range applies only to samples taken after fasting for at least 8 hours.  Glucose, capillary     Status: None    Collection Time: 12/25/21  7:30 AM  Result Value Ref Range    Glucose-Capillary 96 70 - 99 mg/dL      Comment: Glucose reference range applies only to samples taken after fasting for at least 8 hours.      Imaging Results (Last 48 hours)  No results found.         Blood pressure (!) 153/81, pulse 76, temperature 99.4 F (37.4 C), resp.  rate 16, height '5\' 2"'$  (1.575 m), weight 76.7 kg, SpO2 96 %.   Medical Problem List and Plan: 1. Functional deficits secondary to right SDH after fall. Pt s/p craniotomy             -patient may  shower             -ELOS/Goals: 12-14 days, supervision 2.  Antithrombotics: -DVT/anticoagulation:  Pharmaceutical: Lovenox             -antiplatelet therapy: N/A due to bleed.  3. Pain Management: Tylenol prn.  4. Mood: LCSW to follow for evaluation and support.              -antipsychotic agents: N/A 5. Neuropsych: This patient is capable of making decisions on her own behalf. 6. Skin/Wound Care: Routine pressure relief measures.  7. Fluids/Electrolytes/Nutrition: Monitor I/O. Check lytes in  am. 8. HTN: Monitor BP TID--continue metoprolol and Losartan.  9. Chronic diastolic CHF: Low salt diet.   -Check daily weights.  --Monitor for signs of overload.  10. Pre-diabetes:  Hgb A1c-5.7. Will add low carb restrictions. Continue to monitor BS for a few more days.  --RD consulted for diet education.  11. H/o SVT/ PAF: Monitor HR TID--continue metoprolol. Zio patch to be mailed to pt             -apparently had a short run of VT prior transfer this morning 12. Seizure prophylaxis: Continue Keppra BID.  13. Hyperactive bladder: Now on ditropan.              -pt reports baseline urinary frequency     Bary Leriche, PA-C 12/25/2021   I have personally performed a face to face diagnostic evaluation of this patient and formulated the key components of the plan.  Additionally, I have personally reviewed laboratory data, imaging studies, as well as relevant notes and concur with the physician assistant's documentation above.  The patient's status has not changed from the original H&P.  Any changes in documentation from the acute care chart have been noted above.  Meredith Staggers, MD, [facility]

## 2021-12-25 NOTE — PMR Pre-admission (Signed)
PMR Admission Coordinator Pre-Admission Assessment  Patient: Kayla Gray is an 86 y.o., female MRN: [id number] DOB: [birth year] Height: '5\' 2"'  (157.5 cm) Weight: 76.7 kg  Insurance Information HMO:     PPO: yes     PCP:      IPA:      80/20:      OTHER:  PRIMARY: [employer]      Policy#: [id number]      Subscriber: pt CM Name: Lynelle Smoke      Phone#: [phone]     Fax#: epic access Pre-Cert#: [id number] auth for CIR from [place] with [employer] for 7 days.  They have epic access for updates      Employer:  Benefits:  Phone #:      Name:  Eff. Date: 10/15/21     Deduct: $0      Out of Pocket Max: $3200 (met $25)      Life Max:  CIR: $295/day for days 1-6, $0/day for days 7+      SNF: 20 full days Outpatient:      Co-Pay: $15 Home Health: 100%      Co-Pay:  DME: 80%     Co-Ins: 20% Providers:   SECONDARY:       Policy#:      Phone#:   Development worker, community:       Phone#:   The Actuary for patients in Inpatient Rehabilitation Facilities with attached Privacy Act [place] Records was provided and verbally reviewed with: Family  Emergency Contact Information Contact Information     Name Relation Home Work Mobile   Miklas,Harold Spouse [phone]     Hammond) Raynelle Jan Daughter [phone]     Dixon,Shirley Sister [phone]  [phone]       Current Medical History  Patient Admitting Diagnosis: SDH s/p crani  History of Present Illness: Pt is a 86 y/o female presenting to [facility] on 12/18/21 after a fall.  +N/V in ED.  Imaging revealed acute subdural hematoma measuring up to 1.3 cm with substantial leftward midline shift up to 1 cm with partial effacement of the right lateral ventricle.  She also had moderate sized occipital hematoma.  Neurosurgery consulted and recommended urgent craniotomy for decompression.  Post op course complicated by worsening neurologic status on POD 1.  Extensive workup negative for explanatory cause and pt recovered  significantly with adjustment in pain medication.  Syncopal workup negative and ZIO patch was recommended.  Therapy ongoing and patient was recommended for CIR.   Complete NIHSS TOTAL: 2  Patient's medical record from [facility] has been reviewed by the rehabilitation admission coordinator and physician.  Past Medical History  Past Medical History:  Diagnosis Date   Aortic stenosis    Bilateral cataracts    Bleeding nose    Thurs night (09/08/15) and Fri (09/09/15) left side.Bleeding didn't last long   Breast cancer ([place]) 1999   right breast lumpectomy and rad tx   Chronic diastolic (congestive) heart failure (HCC)    Depression    Diverticulosis    Gastroesophageal reflux disease    Heart murmur    Hyperlipidemia    Hypertension    Iron deficiency anemia    Obstructive sleep apnea    Osteoarthritis    Osteopenia    Peptic ulcer disease    Personal history of radiation therapy    S/P aortic valve replacement with bioprosthetic valve 09/16/2015   23 mm Edwards Intuity Elite bioprosthetic tissue valve   Skin cancer 2020  Stroke [facility])    11/14/13   Supraventricular tachycardia (HCC)    Syncope and collapse    Urinary tract infection     Has the patient had major surgery during 100 days prior to admission? Yes  Family History   family history includes Breast cancer in her maternal aunt; Breast cancer (age of onset: 47) in her daughter; Heart failure in her mother.  Current Medications  Current Facility-Administered Medications:    acetaminophen (TYLENOL) tablet 650 mg, 650 mg, Oral, Q4H PRN, 650 mg at 12/23/21 1348 **OR** acetaminophen (TYLENOL) suppository 650 mg, 650 mg, Rectal, Q4H PRN, Loleta Dicker, PA   bisacodyl (DULCOLAX) suppository 10 mg, 10 mg, Rectal, Daily PRN, Loleta Dicker, PA   chlorhexidine (PERIDEX) 0.12 % solution 15 mL, 15 mL, Mouth Rinse, BID, Lanney Gins, Fuad, MD, 15 mL at 12/25/21 0901   Chlorhexidine Gluconate Cloth 2 % PADS 6 each, 6 each,  Topical, Q0600, Meade Maw, MD, 6 each at 12/25/21 0502   enoxaparin (LOVENOX) injection 40 mg, 40 mg, Subcutaneous, Q24H, Loleta Dicker, [place], 40 mg at 12/24/21 2051   ezetimibe (ZETIA) tablet 10 mg, 10 mg, Oral, Daily, Loleta Dicker, PA, 10 mg at 12/25/21 0839   insulin aspart (novoLOG) injection 0-9 Units, 0-9 Units, Subcutaneous, TID WC, Benita Gutter, RPH, 2 Units at 12/23/21 1250   labetalol (NORMODYNE) injection 10-40 mg, 10-40 mg, Intravenous, Q10 min PRN, Loleta Dicker, PA, 20 mg at 12/20/21 1110   levETIRAcetam (KEPPRA) IVPB 500 mg/100 mL premix, 500 mg, Intravenous, Q12H, Loleta Dicker, PA, Last Rate: 400 mL/hr at 12/25/21 0838, 500 mg at 12/25/21 0838   losartan (COZAAR) tablet 25 mg, 25 mg, Oral, Daily, Lorella Nimrod, MD, 25 mg at 12/25/21 6599   MEDLINE mouth rinse, 15 mL, Mouth Rinse, q12n4p, Aleskerov, Fuad, MD, 15 mL at 12/24/21 1622   metoprolol tartrate (LOPRESSOR) tablet 25 mg, 25 mg, Oral, BID, Loleta Dicker, PA, 25 mg at 12/25/21 3570   multivitamin with minerals tablet 1 tablet, 1 tablet, Oral, Daily, Loleta Dicker, PA, 1 tablet at 12/25/21 0839   naloxone (NARCAN) injection 0.08 mg, 0.08 mg, Intravenous, PRN, Loleta Dicker, PA   ondansetron [employer]) tablet 4 mg, 4 mg, Oral, Q4H PRN, 4 mg at 12/18/21 2102 **OR** ondansetron (ZOFRAN) injection 4 mg, 4 mg, Intravenous, Q4H PRN, Loleta Dicker, PA, 4 mg at 12/20/21 1779   oxybutynin (DITROPAN-XL) 24 hr tablet 5 mg, 5 mg, Oral, Daily, Loleta Dicker, PA, 5 mg at 12/25/21 0839   pantoprazole (PROTONIX) EC tablet 40 mg, 40 mg, Oral, Daily, Benita Gutter, RPH, 40 mg at 12/25/21 3903   PARoxetine (PAXIL) tablet 20 mg, 20 mg, Oral, Daily, Loleta Dicker, PA, 20 mg at 12/25/21 0839   polyethylene glycol (MIRALAX / GLYCOLAX) packet 17 g, 17 g, Oral, Daily PRN, Loleta Dicker, PA   promethazine (PHENERGAN) 12.5 mg in sodium chloride 0.9 % 50 mL IVPB, 12.5 mg, Intravenous, Q6H  PRN, Tukov-Yual, Magdalene S, NP, Stopped at 12/19/21 1021   senna (SENOKOT) tablet 8.6 mg, 1 tablet, Oral, BID, Loleta Dicker, PA, 8.6 mg at 12/25/21 0092  Patients Current Diet:  Diet Order             DIET SOFT Room service appropriate? Yes; Fluid consistency: Thin  Diet effective now                   Precautions / Restrictions Precautions Precautions: Fall Restrictions  Weight Bearing Restrictions: No   Has the patient had 2 or more falls or a fall with injury in the past year? Yes  Prior Activity Level Limited Community (1-2x/wk): still driving about 1x/week, needed cane/walker but didn't use  Prior Functional Level Self Care: Did the patient need help bathing, dressing, using the toilet or eating? Independent  Indoor Mobility: Did the patient need assistance with walking from room to room (with or without device)? Independent  Stairs: Did the patient need assistance with internal or external stairs (with or without device)? Independent  Functional Cognition: Did the patient need help planning regular tasks such as shopping or remembering to take medications? Needed some help  Patient Information Are you of Hispanic, Latino/a,or Spanish origin?: A. No, not of Hispanic, Latino/a, or Spanish origin What is your race?: A. White Do you need or want an interpreter to communicate with a doctor or health care staff?: 0. No  Patient's Response To:  Health Literacy and Transportation Is the patient able to respond to health literacy and transportation needs?: No Health Literacy - How often do you need to have someone help you when you read instructions, pamphlets, or other written material from your doctor or pharmacy?: Patient unable to respond In the past 12 months, has lack of transportation kept you from medical appointments or from getting medications?: No (per daughter) In the past 12 months, has lack of transportation kept you from meetings, work, or from getting  things needed for daily living?: No (per daughter)  Development worker, international aid / [place]: Grab bars - tub/shower, Toilet riser, Radio producer - single point, Conservation officer, nature (2 wheels), BSC/3in1  Prior Device Use: Indicate devices/aids used by the patient prior to current illness, exacerbation or injury?  Per family no device used.   Current Functional Level Cognition  Arousal/Alertness: Awake/alert Overall Cognitive Status: Impaired/Different from baseline Orientation Level: Oriented to person, Oriented to place Following Commands: Follows one step commands with increased time Safety/Judgement: Decreased awareness of safety, Decreased awareness of deficits General Comments: Pt requires MOD cues for safety with mobility. MOD cueing for safety with med task activity with increased assist required to follow multi-step directions on pill bottles. Attention: Selective Selective Attention: Impaired Selective Attention Impairment: Verbal basic, Functional basic Memory: Impaired Memory Impairment: Retrieval deficit, Decreased recall of new information, Decreased short term memory Decreased Short Term Memory: Verbal basic, Functional basic Awareness: Impaired Awareness Impairment: Intellectual impairment, Emergent impairment Problem Solving: Impaired Problem Solving Impairment: Verbal complex, Functional complex Executive Function:  (all impaired by lower level deficits) Behaviors: Poor frustration tolerance Safety/Judgment: Impaired [employer] Scales of Cognitive Functioning: Confused/appropriate    Extremity Assessment (includes Sensation/Coordination)  Upper Extremity Assessment: Generalized weakness (grossly at least 3/5 in all movements. Sensation symmetrical) RUE Sensation: WNL RUE Coordination: decreased fine motor LUE Coordination: decreased fine motor  Lower Extremity Assessment: Generalized weakness    ADLs  Overall ADL's : Needs assistance/impaired Eating/Feeding:  Supervision/ safety, Set up, Sitting Eating/Feeding Details (indicate cue type and reason): able to bring cup to mouth and drink via straw Toilet Transfer: Minimal assistance, Min guard, BSC/3in1, Rolling walker (2 wheels) Toilet Transfer Details (indicate cue type and reason): Close SBA to MIN A during STS during transfer to [facility] placed over room toilet. Pt rquires significant increased time/effort to perform functional mobility during session. Moderate multimodal cueing provided t/o session to alert to obstacles in room/safety during session. Toileting- Water quality scientist and Hygiene: Min guard, Sit to/from stand, Cueing for safety, With  adaptive equipment Toileting - Clothing Manipulation Details (indicate cue type and reason): Close SBA for management of mesh underwear and peri-care after using commode. Cueing for safety during task. Close SBA to MIN A for standing hand washing at sink. General ADL Comments: Anticipate increased assist required without use of RW (pt baseline) during functional tasks.    Mobility  Overal bed mobility: Needs Assistance Bed Mobility: Supine to Sit Supine to sit: Supervision, HOB elevated (significantly extra time & use of bed rails, cuing to come to sitting on EOB with BLE feet on floor) Sit to supine: Min guard General bed mobility comments: Pt received sitting in recliner    Transfers  Overall transfer level: Needs assistance Equipment used: Rolling walker (2 wheels) Transfers: Sit to/from Stand, Bed to chair/wheelchair/BSC Sit to Stand: Min assist (pt completes multiple sit>stand from EOB but reaches out for support on counter; is able to complete sit>stand from recliner with close supervision x 1 occasion) Bed to/from chair/wheelchair/BSC transfer type:: Step pivot Step pivot transfers: Min assist, Min guard (bed>BSC with CGA<>Min assist with BUE support on bed/armrests of BSC) General transfer comment: pt stood 1 x EOB with mod assist + bed height  elevated. pt stood ~ 5 sec prior to reporting" I dont feel well." Pt was returned to bed and repositioned. unable to state what didnt feel well prior to falling back to sleep.    Ambulation / Gait / Stairs / Wheelchair Mobility  Ambulation/Gait Ambulation/Gait assistance: Herbalist (Feet): 15 Feet Assistive device: None Gait Pattern/deviations: Decreased step length - right, Decreased step length - left, Decreased stride length, Decreased dorsiflexion - right, Decreased dorsiflexion - left, Wide base of support, Trunk flexed General Gait Details: Guarded gait, cuing to not hold to furniture in room; pt reports feeling proud of herself for walking today Gait velocity: significantly decreased    Posture / Balance Dynamic Sitting Balance Sitting balance - Comments: supervision static sitting, reaches for counter for support when leaning over to attempt to unthread soiled underwear off BLE Balance Overall balance assessment: Needs assistance Sitting-balance support: No upper extremity supported, Feet supported Sitting balance-Leahy Scale: Fair Sitting balance - Comments: supervision static sitting, reaches for counter for support when leaning over to attempt to unthread soiled underwear off BLE Standing balance support: No upper extremity supported, During functional activity Standing balance-Leahy Scale: Poor Standing balance comment: Requires increased assist with standing balance without UE support on RW. Standardized Balance Assessment Standardized Balance Assessment : Berg Balance Test Berg Balance Test Sit to Stand: Able to stand using hands after several tries Standing Unsupported: Able to stand 2 minutes with supervision Sitting with Back Unsupported but Feet Supported on Floor or Stool: Able to sit 2 minutes under supervision Stand to Sit: Sits independently, has uncontrolled descent Transfers: Able to transfer with verbal cueing and /or supervision Standing  Unsupported with Eyes Closed: Able to stand 10 seconds with supervision Standing Ubsupported with Feet Together: Needs help to attain position and unable to hold for 15 seconds From Standing, Reach Forward with Outstretched Arm: Loses balance while trying/requires external support From Standing Position, Pick up Object from Floor: Unable to try/needs assist to keep balance From Standing Position, Turn to Look Behind Over each Shoulder: Needs assist to keep from losing balance and falling Turn 360 Degrees: Needs assistance while turning Standing Unsupported, Alternately Place Feet on Step/Stool: Needs assistance to keep from falling or unable to try Standing Unsupported, One Foot in Front: Loses balance while stepping or  standing Standing on One Leg: Unable to try or needs assist to prevent fall Total Score: 14    Special needs/care consideration Continuous Drip IV  keppra, Skin scalp incision, and Diabetic management yes   Previous Home Environment (from acute therapy documentation) Living Arrangements: Spouse/significant other  Lives With: Spouse Available Help at Discharge:  (limit assistance by family) Type of Home: House Home Layout: Able to live on main level with bedroom/bathroom Home Access: Stairs to enter Entrance Stairs-Rails: Right Entrance Stairs-Number of Steps: 4 Bathroom Shower/Tub: Chiropodist: Standard Additional Comments: Per past chart history: Pt utilizes SPC in community and no AD at home  Discharge Living Setting Plans for Discharge Living Setting: Patient's home, Lives with (comment) (family will come stay at discharge) Type of Home at Discharge: House Discharge Home Layout: One level Discharge Home Access: Stairs to enter Entrance Stairs-Rails: Right, Left Entrance Stairs-Number of Steps: 2 (through back) Discharge Bathroom Shower/Tub: Tub/shower unit Discharge Bathroom Toilet: Standard Discharge Bathroom Accessibility: Yes How  Accessible: Accessible via walker Does the patient have any problems obtaining your medications?: No  Social/Family/Support Systems Patient Roles: Spouse Anticipated Caregiver: Nolon Lennert (dtr), Enid Derry (sister), Joneen Boers (spouse) Anticipated Caregiver's Contact Information: [name] is primary contact [phone] Ability/Limitations of Caregiver: [name] is undergoing tx for cancer, but organizing care for pt; Joneen Boers is almost 17 and difficulty with mobility and cognition Caregiver Availability: 24/7 Discharge Plan Discussed with Primary Caregiver: Yes Is Caregiver In Agreement with Plan?: Yes Does Caregiver/Family have Issues with Lodging/Transportation while Pt is in Rehab?: No  Goals Patient/Family Goal for Rehab: PT/OT/SLP supervision to min  assist Expected length of stay: 12-14 days Additional Information: frequent falls at home Pt/Family Agrees to Admission and willing to participate: Yes Program Orientation Provided & Reviewed with Pt/Caregiver Including Roles  & Responsibilities: Yes  Barriers to Discharge: Insurance for SNF coverage  Decrease burden of Care through IP rehab admission: n/a  Possible need for SNF placement upon discharge: no  Patient Condition: I have reviewed medical records from [facility], spoken with CM, and patient, spouse, and daughter. I discussed via phone for inpatient rehabilitation assessment.  Patient will benefit from ongoing PT, OT, and SLP, can actively participate in 3 hours of therapy a day 5 days of the week, and can make measurable gains during the admission.  Patient will also benefit from the coordinated team approach during an Inpatient Acute Rehabilitation admission.  The patient will receive intensive therapy as well as Rehabilitation physician, nursing, social worker, and care management interventions.  Due to bladder management, bowel management, safety, skin/wound care, disease management, medication administration, pain management, and patient  education the patient requires 24 hour a day rehabilitation nursing.  The patient is currently min to mod assist with mobility and basic ADLs.  Discharge setting and therapy post discharge at home with home health is anticipated.  Patient has agreed to participate in the Acute Inpatient Rehabilitation Program and will admit today.  Preadmission Screen Completed By:  Michel Santee, PT, DPT 12/25/2021 10:35 AM ______________________________________________________________________   Discussed status with Dr. Naaman Plummer on 12/25/21  at 10:46 AM  and received approval for admission today.  Admission Coordinator:  Michel Santee, PT, DPT time 10:46 AM Sudie Grumbling 12/25/21    Assessment/Plan: Diagnosis: right SDH after fall Does the need for close, 24 hr/day Medical supervision in concert with the patient's rehab needs make it unreasonable for this patient to be served in a less intensive setting? Yes Co-Morbidities requiring supervision/potential complications: post-op, AS,  breast cancer, cdCHF Due to bladder management, bowel management, safety, skin/wound care, disease management, medication administration, pain management, and patient education, does the patient require 24 hr/day rehab nursing? Yes Does the patient require coordinated care of a physician, rehab nurse, PT, OT, and SLP to address physical and functional deficits in the context of the above medical diagnosis(es)? Yes Addressing deficits in the following areas: balance, endurance, locomotion, strength, transferring, bowel/bladder control, bathing, dressing, feeding, grooming, toileting, cognition, speech, and psychosocial support Can the patient actively participate in an intensive therapy program of at least 3 hrs of therapy 5 days a week? Yes The potential for patient to make measurable gains while on inpatient rehab is excellent Anticipated functional outcomes upon discharge from inpatient rehab: supervision and min assist PT, supervision and  min assist OT, supervision and min assist SLP Estimated rehab length of stay to reach the above functional goals is: 12-14 days Anticipated discharge destination: Home 10. Overall Rehab/Functional Prognosis: excellent   MD Signature: Meredith Staggers, MD, [place] Director Rehabilitation Services 12/25/2021

## 2021-12-26 DIAGNOSIS — R1312 Dysphagia, oropharyngeal phase: Secondary | ICD-10-CM

## 2021-12-26 DIAGNOSIS — D62 Acute posthemorrhagic anemia: Secondary | ICD-10-CM

## 2021-12-26 LAB — COMPREHENSIVE METABOLIC PANEL
ALT: 19 U/L (ref 0–44)
AST: 26 U/L (ref 15–41)
Albumin: 2.7 g/dL — ABNORMAL LOW (ref 3.5–5.0)
Alkaline Phosphatase: 44 U/L (ref 38–126)
Anion gap: 5 (ref 5–15)
BUN: 11 mg/dL (ref 8–23)
CO2: 28 mmol/L (ref 22–32)
Calcium: 8.5 mg/dL — ABNORMAL LOW (ref 8.9–10.3)
Chloride: 106 mmol/L (ref 98–111)
Creatinine, Ser: 0.86 mg/dL (ref 0.44–1.00)
GFR, Estimated: >60 mL/min (ref >60)
Glucose, Bld: 102 mg/dL — ABNORMAL HIGH (ref 70–99)
Potassium: 3.7 mmol/L (ref 3.5–5.1)
Sodium: 139 mmol/L (ref 135–145)
Total Bilirubin: 0.4 mg/dL (ref 0.3–1.2)
Total Protein: 5.6 g/dL — ABNORMAL LOW (ref 6.5–8.1)

## 2021-12-26 LAB — CBC WITH DIFFERENTIAL/PLATELET
Abs Immature Granulocytes: 0.02 10*3/uL (ref 0.00–0.07)
Basophils Absolute: 0.1 10*3/uL (ref 0.0–0.1)
Basophils Relative: 1 %
Eosinophils Absolute: 0.4 10*3/uL (ref 0.0–0.5)
Eosinophils Relative: 6 %
HCT: 30.1 % — ABNORMAL LOW (ref 36.0–46.0)
Hemoglobin: 9.9 g/dL — ABNORMAL LOW (ref 12.0–15.0)
Immature Granulocytes: 0 %
Lymphocytes Relative: 26 %
Lymphs Abs: 1.7 10*3/uL (ref 0.7–4.0)
MCH: 28.6 pg (ref 26.0–34.0)
MCHC: 32.9 g/dL (ref 30.0–36.0)
MCV: 87 fL (ref 80.0–100.0)
Monocytes Absolute: 0.7 10*3/uL (ref 0.1–1.0)
Monocytes Relative: 11 %
Neutro Abs: 3.7 10*3/uL (ref 1.7–7.7)
Neutrophils Relative %: 56 %
Platelets: 147 10*3/uL — ABNORMAL LOW (ref 150–400)
RBC: 3.46 MIL/uL — ABNORMAL LOW (ref 3.87–5.11)
RDW: 14 % (ref 11.5–15.5)
WBC: 6.6 10*3/uL (ref 4.0–10.5)
nRBC: 0 % (ref 0.0–0.2)

## 2021-12-26 LAB — GLUCOSE, CAPILLARY
Glucose-Capillary: 107 mg/dL — ABNORMAL HIGH (ref 70–99)
Glucose-Capillary: 90 mg/dL (ref 70–99)

## 2021-12-26 LAB — HEMOGLOBIN A1C
Hgb A1c MFr Bld: 5.7 % — ABNORMAL HIGH (ref 4.8–5.6)
Mean Plasma Glucose: 117 mg/dL

## 2021-12-26 MED ORDER — ENSURE MAX PROTEIN PO LIQD
11.0000 [oz_av] | Freq: Every day | ORAL | Status: DC
  Administered 2021-12-26 – 2022-01-04 (×9): 11 [oz_av] via ORAL

## 2021-12-26 NOTE — Progress Notes (Signed)
Inpatient Rehabilitation  Patient information reviewed and entered into eRehab system by Paytyn Mesta M. Mcihael Hinderman, M.A., CCC/SLP, PPS Coordinator.  Information including medical coding, functional ability and quality indicators will be reviewed and updated through discharge.    

## 2021-12-26 NOTE — Progress Notes (Signed)
?                                                       PROGRESS NOTE ? ? ?Subjective/Complaints: ?Pt had an uneventful night. No complaints this morning. Had struggles during swallowing with SLP. May have choked. Pt reports some swallowing difficulties prior to this injury ? ?ROS: Patient denies fever, rash, sore throat, blurred vision, dizziness, nausea, vomiting, diarrhea, cough, shortness of breath or chest pain, joint or back/neck pain, headache, or mood change.  ? ? ?Objective: ?  ?No results found. ?Recent Labs  ?  12/26/21 ?0527  ?WBC 6.6  ?HGB 9.9*  ?HCT 30.1*  ?PLT 147*  ? ?Recent Labs  ?  12/26/21 ?0527  ?NA 139  ?K 3.7  ?CL 106  ?CO2 28  ?GLUCOSE 102*  ?BUN 11  ?CREATININE 0.86  ?CALCIUM 8.5*  ? ? ?Intake/Output Summary (Last 24 hours) at 12/26/2021 1245 ?Last data filed at 12/26/2021 0849 ?Gross per 24 hour  ?Intake 120 ml  ?Output --  ?Net 120 ml  ?  ? ?  ? ?Physical Exam: ?Vital Signs ?Blood pressure 128/83, pulse 99, temperature 97.7 ?F (36.5 ?C), temperature source Oral, resp. rate 18, height '5\' 2"'$  (1.575 m), weight 74.7 kg, SpO2 99 %. ? ?General: Alert and oriented x 3, No apparent distress ?HEENT: right crani incision with staples.  PERRLA, EOMI, sclera anicteric, oral mucosa pink and moist, dentition intact, ext ear canals clear,  ?Neck: Supple without JVD or lymphadenopathy ?Heart: Reg rate and rhythm. No murmurs rubs or gallops ?Chest: CTA bilaterally without wheezes, rales, or rhonchi; no distress ?Abdomen: Soft, non-tender, non-distended, bowel sounds positive. ?Extremities: No clubbing, cyanosis, or edema. Pulses are 2+ ?Psych: Pt's affect is appropriate. Pt is cooperative ?Skin: Clean and intact without signs of breakdown ?Neuro:  Alert and oriented x 3. fair insight and awareness. Intact Memory. Normal language and speech. Cranial nerve exam unremarkable. Mild left sided weakness. RUE and RLE 5/5. Sensory exam normal for light touch and pain in all 4 limbs. No limb ataxia or cerebellar  signs. No abnormal tone appreciated.   ?Musculoskeletal: no pain. Old TKA incisions  ? ? ?Assessment/Plan: ?1. Functional deficits which require 3+ hours per day of interdisciplinary therapy in a comprehensive inpatient rehab setting. ?Physiatrist is providing close team supervision and 24 hour management of active medical problems listed below. ?Physiatrist and rehab team continue to assess barriers to discharge/monitor patient progress toward functional and medical goals ? ?Care Tool: ? ?Bathing ?   ?Body parts bathed by patient: Right arm, Left arm, Chest, Abdomen, Front perineal area, Buttocks, Right upper leg, Left upper leg, Face  ? Body parts bathed by helper: Right lower leg, Left lower leg ?  ?  ?Bathing assist Assist Level: Minimal Assistance - Patient > 75% ?  ?  ?Upper Body Dressing/Undressing ?Upper body dressing   ?What is the patient wearing?: Pull over shirt ?   ?Upper body assist Assist Level: Moderate Assistance - Patient 50 - 74% ?   ?Lower Body Dressing/Undressing ?Lower body dressing ? ? ?   ?What is the patient wearing?: Underwear/pull up, Pants ? ?  ? ?Lower body assist Assist for lower body dressing: Maximal Assistance - Patient 25 - 49% ?   ? ?Toileting ?Toileting    ?Toileting assist Assist for toileting:  Minimal Assistance - Patient > 75% ?Assistive Device Comment: walker and contact guard ?  ?Transfers ?Chair/bed transfer ? ?Transfers assist ?   ? ?Chair/bed transfer assist level: Minimal Assistance - Patient > 75% ?  ?  ?Locomotion ?Ambulation ? ? ?Ambulation assist ? ?   ? ?Assist level: Minimal Assistance - Patient > 75% ?Assistive device: Hand held assist ?Max distance: 60 ft  ? ?Walk 10 feet activity ? ? ?Assist ?   ? ?Assist level: Minimal Assistance - Patient > 75% ?Assistive device: Hand held assist  ? ?Walk 50 feet activity ? ? ?Assist   ? ?Assist level: Minimal Assistance - Patient > 75% ?Assistive device: Hand held assist  ? ? ?Walk 150 feet activity ? ? ?Assist Walk 150 feet  activity did not occur: Safety/medical concerns ? ?  ?  ?  ? ?Walk 10 feet on uneven surface  ?activity ? ? ?Assist Walk 10 feet on uneven surfaces activity did not occur: Safety/medical concerns ? ? ?  ?   ? ?Wheelchair ? ? ? ? ?Assist Is the patient using a wheelchair?: Yes ?Type of Wheelchair: Manual ?  ? ?Wheelchair assist level: Total Assistance - Patient < 25% ?Max wheelchair distance: 150 ft  ? ? ?Wheelchair 50 feet with 2 turns activity ? ? ? ?Assist ? ?  ?  ? ? ?Assist Level: Total Assistance - Patient < 25%  ? ?Wheelchair 150 feet activity  ? ? ? ?Assist ?   ? ? ?Assist Level: Total Assistance - Patient < 25%  ? ?Blood pressure 128/83, pulse 99, temperature 97.7 ?F (36.5 ?C), temperature source Oral, resp. rate 18, height '5\' 2"'$  (1.575 m), weight 74.7 kg, SpO2 99 %. ? ?Medical Problem List and Plan: ?1. Functional deficits secondary to right SDH after fall. Pt s/p craniotomy ?            -patient may  shower ?            -ELOS/Goals: 12-14 days, supervision ? -Patient is beginning CIR therapies today including PT, OT, and SLP  ?2.  Antithrombotics: ?-DVT/anticoagulation:  Pharmaceutical: Lovenox ?            -antiplatelet therapy: N/A due to bleed.  ?3. Pain Management: Tylenol prn.  ?4. Mood: LCSW to follow for evaluation and support.  ?            -antipsychotic agents: N/A ?5. Neuropsych: This patient is capable of making decisions on her own behalf. ?6. Skin/Wound Care: Routine pressure relief measures.  ?7. Fluids/Electrolytes/Nutrition: encourage PO ? -I personally reviewed the patient's labs today.   ? -add protein ?8. HTN: Monitor BP TID--continue metoprolol and Losartan.  ?9. Chronic diastolic CHF: Low salt diet.  ? -Check daily weights.  ?--Monitor for signs of overload.  ?10. Pre-diabetes:  Hgb A1c-5.7. Added low carb restrictions.  ?-fairly well controlled. Can reduce cbg's to AM only  ?--RD consulted for diet education.  ?11. H/o SVT/ PAF: Monitor HR TID--continue metoprolol. Zio patch to be  mailed to pt ?            -apparently had a short run of VT prior transfer this morning ? -no issues, sx so far ?12. Seizure prophylaxis: Continue Keppra BID.  ?13. Hyperactive bladder: Now on ditropan.  ?            -pt reports baseline urinary frequency ?14. Post-op anemia ? -hgb 9.9 ? -add fe supp ? -recheck cbc Monday ?15. Dysphagia: ? -pt coughed  and choked on thin liquids ? -pt now on D3/nectars ? -MBS tomorrow per SLP ?  ? ?LOS: ?1 days ?A FACE TO FACE EVALUATION WAS PERFORMED ? ?Meredith Staggers ?12/26/2021, 12:45 PM  ? ?  ?

## 2021-12-26 NOTE — Evaluation (Signed)
Physical Therapy Assessment and Plan ? ?Patient Details  ?Name: Kayla Gray ?MRN: [id number] ?Date of Birth: Nov 16, 1935 ? ?PT Diagnosis: Abnormal posture, Abnormality of gait, Cognitive deficits, Difficulty walking, Edema, and Muscle weakness ?Rehab Potential: Good ?ELOS: 10-12 days  ? ?Today's Date: 12/26/2021 ?PT Individual Time: 1100-1200 ?PT Individual Time Calculation (min): 60 min   ? ?Hospital Problem: Principal Problem: ?  SDH (subdural hematoma) ? ? ?Past Medical History:  ?Past Medical History:  ?Diagnosis Date  ? Aortic stenosis   ? Bilateral cataracts   ? Bleeding nose   ? Thurs night (09/08/15) and Fri (09/09/15) left side.Bleeding didn't last long  ? Breast cancer ([place]) 1999  ? right breast lumpectomy and rad tx  ? Chronic diastolic (congestive) heart failure (HCC)   ? Depression   ? Diverticulosis   ? Gastroesophageal reflux disease   ? Heart murmur   ? Hyperlipidemia   ? Hypertension   ? Iron deficiency anemia   ? Obstructive sleep apnea   ? Osteoarthritis   ? Osteopenia   ? Peptic ulcer disease   ? Personal history of radiation therapy   ? S/P aortic valve replacement with bioprosthetic valve 09/16/2015  ? 23 mm Edwards Intuity Elite bioprosthetic tissue valve  ? Skin cancer 2020  ? Stroke [facility])   ? 11/14/13  ? Supraventricular tachycardia ([place])   ? Syncope and collapse   ? Urinary tract infection   ? ?Past Surgical History:  ?Past Surgical History:  ?Procedure Laterality Date  ? AORTIC VALVE REPLACEMENT N/A 09/16/2015  ? Procedure: AORTIC VALVE REPLACEMENT (AVR);  Surgeon: Rexene Alberts, MD;  Location: [facility];  Service: Open Heart Surgery;  Laterality: N/A;  ? BREAST BIOPSY Right 1999  ? breast ca radation  ? BREAST EXCISIONAL BIOPSY Left yrs ago   ? benign  ? BREAST LUMPECTOMY Right 1999  ? with radiation  ? CARDIAC CATHETERIZATION N/A 08/17/2015  ? Procedure: Left Heart Cath and Coronary Angiography;  Surgeon: Minna Merritts, MD;  Location: [facility];  Service: Cardiovascular;   Laterality: N/A;  ? CATARACT EXTRACTION, BILATERAL    ? CHOLECYSTECTOMY    ? CRANIOTOMY Right 12/18/2021  ? Procedure: CRANIOTOMY HEMATOMA EVACUATION SUBDURAL;  Surgeon: Meade Maw, MD;  Location: [facility];  Service: Neurosurgery;  Laterality: Right;  ? JOINT REPLACEMENT Bilateral   ? knee replacement  ? KNEE ARTHROSCOPY    ? right  ? KNEE ARTHROSCOPY    ? left  ? SKIN CANCER EXCISION  2020  ? TEE WITHOUT CARDIOVERSION N/A 09/16/2015  ? Procedure: TRANSESOPHAGEAL ECHOCARDIOGRAM (TEE);  Surgeon: Rexene Alberts, MD;  Location: [place];  Service: Open Heart Surgery;  Laterality: N/A;  ? TONSILLECTOMY    ? VAGINAL HYSTERECTOMY    ? ? ?Assessment & Plan ?Clinical Impression: Patient is a 86 y.o. year old female  with history of HTN, iron deficiency anemia, chronic dCHF, breast cancer, SVT who was admitted to [facility] on 12/18/21 after fall (while mopping her floor), striking back of her head with syncope. She was noted to have hematoma on the back of her head, reported HA and was vomiting at admission. CT head done revealing 1.3 cm R-SDH along falx and right tentorium with 1 cm leftward midline shift and moderate size occipital scalp hematoma. She was taken to OR for right crani for evacuation of SDH with placement of SD drain by Dr. Cari Caraway the same day. Post op with lethargy with ongoing HA and EEG done revealing right frontal  cortical dysfunction with  moderate diffuse encephalopathy and was negative for seizures.  ?  ?AMS felt to be due to narcotics which was discontinued and follow up CT head stable post op changes with blood extending along dorsal and ventral aspects of craniocervical junction into upper cervical spine surrounding cord. Cervical MRI done for work up showing extramedullary hemorrhage extending to C5 with mild to moderate narrowing of upper cervical canal without mass effect and moderate advanced disc degeneration C6-C7 with severe bilateral neural foraminal narrowing. MRI Brain showed stable  bleed with acute ischemia right superior frontal gyrus and right insular cortex. 2D echo showed EF 60-65%, mild MVR and functioning bioprosthetic AV.  Mentation has improved and cardiology consulted for input due to history of PAF. ZIO patch recommended to rule out cardiac source. BP has been labile and Losartan added for better control. Therapy ongoing and patient limited by weakness, balance deficits and need increased time for processing. CIR recommended due to functional decline.    Patient transferred to CIR on 12/25/2021 .  ? ?Patient currently requires min with mobility secondary to muscle weakness and muscle joint tightness, decreased cardiorespiratoy endurance, decreased motor planning, decreased attention to left, decreased initiation, decreased attention, decreased awareness, decreased problem solving, decreased safety awareness, decreased memory, delayed processing, and demonstrates behaviors consistent with [place], and decreased sitting balance, decreased standing balance, decreased postural control, and decreased balance strategies.  Prior to hospitalization, patient was modified independent  with mobility and lived with Spouse in a House home.  Home access is 4 per chair, 6 per ptStairs to enter. ? ?Patient will benefit from skilled PT intervention to maximize safe functional mobility, minimize fall risk, and decrease caregiver burden for planned discharge home with 24 hour supervision.  Anticipate patient will benefit from follow up [place] at discharge. ? ?PT - End of Session ?Activity Tolerance: Tolerates 30+ min activity with multiple rests ?Endurance Deficit: Yes ?Endurance Deficit Description: requires multiple rest breaks with minimal activity ?PT Assessment ?Rehab Potential (ACUTE/IP ONLY): Good ?PT Barriers to Discharge: Decreased caregiver support;Lack of/limited family support;Behavior;Incontinence ?PT Patient demonstrates impairments in the following area(s):  Balance;Perception;Safety;Behavior;Edema;Sensory;Skin Integrity;Endurance;Motor;Nutrition;Pain ?PT Transfers Functional Problem(s): Bed Mobility;Bed to Chair;Car;Furniture ?PT Locomotion Functional Problem(s): Ambulation;Wheelchair Mobility;Stairs ?PT Plan ?PT Intensity: Minimum of 1-2 x/day ,45 to 90 minutes ?PT Frequency: 5 out of 7 days ?PT Duration Estimated Length of Stay: 10-12 days ?PT Treatment/Interventions: Ambulation/gait training;Cognitive remediation/compensation;Discharge planning;DME/adaptive equipment instruction;Functional mobility training;Pain management;Psychosocial support;Splinting/orthotics;Therapeutic Activities;UE/LE Strength taining/ROM;Visual/perceptual remediation/compensation;Balance/vestibular training;Community reintegration;Disease management/prevention;Functional electrical stimulation;Neuromuscular re-education;Patient/family education;Skin care/wound management;Stair training;Therapeutic Exercise;UE/LE Coordination activities;Wheelchair propulsion/positioning ?PT Transfers Anticipated Outcome(s): supervision using LRAD ?PT Locomotion Anticipated Outcome(s): supervision using LRAD >100 feet ?PT Recommendation ?Follow Up Recommendations: Home health PT ?Patient destination: Home ?Equipment Recommended: To be determined ?Equipment Details: patient has RW and SPC ? ? ?PT Evaluation ?Precautions/Restrictions ?Precautions ?Precautions: Fall ?Restrictions ?Weight Bearing Restrictions: No ?Pain Interference ?Pain Interference ?Pain Effect on Sleep: 0. Does not apply - I have not had any pain or hurting in the past 5 days ?Pain Interference with Therapy Activities: 0. Does not apply - I have not received rehabilitationtherapy in the past 5 days ?Pain Interference with Day-to-Day Activities: 1. Rarely or not at all ?Home Living/Prior Functioning ?Home Living ?Available Help at Discharge: Available PRN/intermittently ?Type of Home: House ?Home Access: Stairs to enter ?Entrance Stairs-Number  of Steps: 4 per chair, 6 per pt ?Entrance Stairs-Rails: Right ?Home Layout: One level ?Bathroom Shower/Tub: Tub/shower unit ?Bathroom Toilet: Standard ?Additional Comments: Pt utilizes SPC in community  and no AD at home ? Lives With: Spou

## 2021-12-26 NOTE — Progress Notes (Addendum)
Initial Nutrition Assessment ? ?DOCUMENTATION CODES:  ? ?Not applicable ? ?INTERVENTION:  ?Continue Ensure Max po once daily, each supplement provides 150 kcal and 30 grams of protein.  ? ?Diet education handout given. ? ?NUTRITION DIAGNOSIS:  ? ?Increased nutrient needs related to chronic illness (CHF) as evidenced by estimated needs. ? ?GOAL:  ? ?Patient will meet greater than or equal to 90% of their needs ? ?MONITOR:  ? ?PO intake, Supplement acceptance, Diet advancement, Labs, Weight trends, Skin, I & O's ? ?REASON FOR ASSESSMENT:  ? ?Consult ?Diet education, Assessment of nutrition requirement/status ? ?ASSESSMENT:  ? ?86 year old female with history of HTN, chronic dCHF, breast cancer, SVT who was initially admitted after fall striking back of head with syncope. CT head done revealing 1.3 cm R-SDH along falx and right tentorium with 1 cm leftward midline shift and moderate size occipital scalp hematoma. She was taken to OR for right crani for evacuation of SDH. Therapy ongoing and patient limited by weakness, balance deficits and need increased time for processing. CIR admitted due to functional decline. ? ?Meal completion has been 60-100%. Pt reports having a good appetite currently and PTA with no difficulties. No abdominal pains or discomfort during time of visit. Pt currently has Ensure max ordered. RD to continue with current orders to aid in caloric and protein needs.  ? ?Diet handout "General, Healthful Nutrition Therapy" from the Academy of Nutrition and Dietetics Manual placed in pt discharge instructions. Pt with pre-diabetes.  ? ?Lab Results  ?Component Value Date  ? HGBA1C 5.7 (H) 12/25/2021  ? ? ?NUTRITION - FOCUSED PHYSICAL EXAM: ? ?Flowsheet Row Most Recent Value  ?Orbital Region No depletion  ?Upper Arm Region No depletion  ?Thoracic and Lumbar Region No depletion  ?Buccal Region No depletion  ?Temple Region No depletion  ?Clavicle Bone Region Moderate depletion  ?Clavicle and Acromion Bone  Region Moderate depletion  ?Scapular Bone Region Unable to assess  ?Dorsal Hand Unable to assess  ?Patellar Region No depletion  ?Anterior Thigh Region No depletion  ?Posterior Calf Region No depletion  ?Edema (RD Assessment) Mild  ?Hair Reviewed  ?Eyes Reviewed  ?Mouth Reviewed  ?Skin Reviewed  ?Nails Reviewed  ? ?  ? ?Labs and medications reviewed.  ? ?Diet Order:   ?Diet Order   ? ?       ?  DIET DYS 3 Room service appropriate? Yes; Fluid consistency: Nectar Thick  Diet effective now       ?  ? ?  ?  ? ?  ? ? ?EDUCATION NEEDS:  ? ?Not appropriate for education at this time ? ?Skin:  Skin Assessment: Skin Integrity Issues: ?Skin Integrity Issues:: Incisions ?Incisions: head ? ?Last BM:  3/11 ? ?Height:  ? ?Ht Readings from Last 1 Encounters:  ?12/25/21 '5\' 2"'$  (1.575 m)  ? ? ?Weight:  ? ?Wt Readings from Last 1 Encounters:  ?12/26/21 74.7 kg  ? ? ?BMI:  Body mass index is 30.12 kg/m?. ? ?Estimated Nutritional Needs:  ? ?Kcal:  1750-1900 ? ?Protein:  85-95 grams ? ?Fluid:  >/= 1.7 L/day ? ?Corrin Parker, MS, RD, LDN ?RD pager number/after hours weekend pager number on Amion. ? ?

## 2021-12-26 NOTE — Discharge Instructions (Addendum)
?Inpatient Rehab Discharge Instructions ? ?Fay Records ?Discharge date and time:  01/05/22 ? ?Activities/Precautions/ Functional Status: ?Activity: no lifting, driving, or strenuous exercise till cleared by MD ?Diet: diabetic diet ?Wound Care: keep wound clean and dry ? ? ?Functional status:  ?___ No restrictions     ___ Walk up steps independently ?_X__ 24/7 supervision/assistance   ___ Walk up steps with assistance ?___ Intermittent supervision/assistance  ___ Bathe/dress independently ?___ Walk with walker     _X__ Bathe/dress with assistance ?___ Walk Independently    ___ Shower independently ?___ Walk with assistance    ___ Shower with assistance ?__X_ No alcohol     ___ Return to work/school ________ ? ? ?COMMUNITY REFERRALS UPON DISCHARGE:   ? ?No Home Health agency secured at this time. Will explore home health agencies through Monday and will follow-up on if an agency was willing to accept. If not, a referral will be sent to [facility] [phone] for PT/OT/SLP. ? ?Medical Equipment/Items Ordered:rolling walker and tub transfer bench ?                                                Agency/Supplier:[facility] [phone] ? ?Special Instructions: ? ? ? ?My questions have been answered and I understand these instructions. I will adhere to these goals and the provided educational materials after my discharge from the hospital. ? ?Patient/Caregiver Signature _______________________________ Date __________ ? ?Clinician Signature _______________________________________ Date __________ ? ?Please bring this form and your medication list with you to all your follow-up doctor's appointments.  General, Healthful Nutrition Therapy ?This handout provides you with the information you'll need to follow a general, healthful diet, which can be tailored to your personal preferences. ?There are several benefits to following a general, healthful diet: ?It could mean less calories, less salt, less  added sugars, and less saturated fat than many other diets. This outcome will depend on the foods you choose. ?Eating more whole grains, beans, lentils, fruits, vegetables, nuts, and seeds may improve how much fiber, vitamins, and minerals you eat.  ?It can lower your risk for health conditions like diabetes, heart disease, hypertension, stroke, and cancer. ?Your registered dietitian nutritionist (RDN) may recommend portion sizes based on your individual needs and personal and cultural preferences. ?Tips ?Every day, eat a variety of fruits and vegetables in a variety of colors. ?Be sure to include lots of dark green, red, blue-purple, and orange vegetables. ?Choose whole grains for at least half of your grain selections. ?Eat more beans, peas, and lentils. ?Try meatless alternatives. ?Get protein in your diet from eggs, fish, poultry, beans, peas, lentils, and nuts/nut butters. ?Low-fat or fat-free dairy products are also good sources of protein. ?Keep your salt intake to a minimum (less than 2300 milligrams per day). ?Limit use of salt, soy sauce, or fish sauce when cooking. ?Eat freshly prepared meals at home. Processed, prepackaged, and restaurant foods contain more salt. ?Choose fresh fruits and vegetables for snacks. ?Choose products with lower sodium content when grocery shopping. ?Limit your daily sugar intake. ?Sugar may be used in sauces, marinades, dressings, and condiments - even those that do not taste sweet.  ?Sugar can be found in honey, syrups, jelly, fruit juice, and fruit juice concentrate. ?Limit sugar-sweetened beverages like sodas and fruit juice, sugary snacks, and candy. ?It's best to choose products without added sugar, but if  you do eat them, read labels carefully so you know how much sugar is in each portion. ?It is better to eat unsaturated fats than saturated fats. ?Use fats and oils in moderation, up to 5 servings per day. ?Unsaturated fat is found in fish, avocado, nuts, and oils like  sunflower, canola, avocado and olive oils. ?Saturated fat is found in fatty meat, butter, ice cream, palm and coconut oil, cream, cheese, and lard. ?Many processed foods, fried foods, fast food items, convenience foods like frozen pizza and snack foods, and sweets including pies, cookies, and other pastries are high in fat. Check nutrition labels and choose these foods less often. ?Use vegetable oil instead of lard or butter for cooking. ?Boil, steam, or bake your food instead of deep frying in oil. ?Remove the fatty part of meats before cooking. ?Foods to Choose or to Limit ?Choose a healthful balance of foods from each food group at your meals. Your RDN may make individualized portion size recommendations based on your needs. ?Food Group Foods to Choose Foods to Limit  ?Grains Whole wheat, barley, rye, buckwheat, corn, teff, quinoa, millet, amaranth, brown and wild rice, sorghum, and oats; focus on intact cooked whole grains ?Grain products, such as bread, rolls, prepared breakfast cereals, crackers, and pasta made from whole grains that are low in added sugars, saturated fat, and sodium Sweetened, low-fiber breakfast cereals ?Packaged (high sugar, refined ingredients) baked goods ?Snack crackers and chips made of refined ingredients, cheese crackers, butter crackers ?Breads made with refined ingredients and saturated fats, such as biscuits, frozen waffles, sweet breads, doughnuts, pastries, packaged baking mixes, pancakes, cakes, and cookies  ?Protein Foods Meat, including lean, trimmed cuts of beef, pork, or lamb a few times per week or less ?Poultry, including skinless chicken or [place] ?Seafood, including fish, shrimp, lobster, clams, and scallops at least twice per week. Focus on fatty fish, such as salmon, herring, and sardines, as a rich source of omega-3 fatty acids ?Eggs ?Nuts and seeds, such as peanuts, almonds, pistachios, and sunflower seeds (unsalted varieties) ?Nut and seed butters, such as peanut  butter, almond butter, and sunflower seed butter (reduced-sodium varieties)  ?Soy foods such as tofu, tempeh, or soy nuts ?Plant protein-based meat alternatives, such as veggie burgers and sausages (reduced-sodium varieties) ?Unsalted beans, lentils, or peas at least a few times per week in place of other protein sources Marbled or fatty red meats (beef, pork, lamb), such as ribs ?Processed red meats, such as bacon, sausage, and ham ?Poultry (chicken and [place]) with skin ?Fried meats, poultry, or fish ?King mackerel, shark, and tilefish (may contain high levels of mercury) ?Deli meats, such as pastrami, bologna, or salami ?Fried eggs ?Salted beans, peas, lentils, nuts, seeds, or nut/seed butters ?Meat alternatives with high levels of sodium or saturated fat  ?Dairy and Dairy Alternatives Low-fat or fat-free milk, yogurt (low in added sugars), cottage cheese, and cheeses ?Fortified soymilk or soy yogurt Whole milk, cream, cheeses made from whole milk, sour cream ?Yogurt or ice cream made from whole milk or with added sugar ?Cream cheese made from whole milk  ?Vegetables A variety of vegetables, including dark-green, red, blue-purple, and orange vegetables ?Low-sodium vegetable juices Canned or frozen vegetables with salt, fresh vegetables prepared with salt ?Fried vegetables ?Vegetables in cream sauce or cheese sauce ?Tomato or pasta sauce with high levels of salt or sugar  ?Fruit A variety of whole fruits, canned fruit packed in water, or dried fruit ?100% fruit juice (limited to ? cup per day)  Fruits packed in syrup or made with added sugar  ?Fats and Oils Unsaturated vegetable oils, including olive, peanut, and canola oils ?Vegetable oil-based margarines and spreads ?Salad dressing and mayonnaise made from unsaturated vegetable oils Solid shortening or partially hydrogenated oils ?Solid margarine made with hydrogenated or partially hydrogenated oils ?Butter  ?Beverages Coffee and tea (unsweetened) ?Water  Sweetened drinks, including sweetened coffee or tea drinks, soda, energy drinks, and sports drinks  ?Other Prepared foods, including soups, casseroles, salads, baked goods, and snacks made from recommended ingredient

## 2021-12-26 NOTE — IPOC Note (Signed)
Overall Plan of Care (IPOC) ?Patient Details ?Name: Kayla Gray ?MRN: [id number] ?DOB: [birth year] ? ?Admitting Diagnosis: SDH (subdural hematoma) ? ?Hospital Problems: Principal Problem: ?  SDH (subdural hematoma) ? ? ? ? Functional Problem List: ?Nursing Bladder, Bowel, Endurance, Motor, Perception, Safety, Skin Integrity  ?PT Balance, Perception, Safety, Behavior, Edema, Sensory, Skin Integrity, Endurance, Motor, Nutrition, Pain  ?OT Balance, Cognition, Endurance, Motor, Nutrition, Pain, Perception, Safety, Sensory, Vision  ?SLP Cognition, Safety  ?TR    ?    ? Basic ADL?s: ?OT Grooming, Bathing, Dressing, Toileting  ? ?  Advanced  ADL?s: ?OT Simple Meal Preparation, Laundry, Light Housekeeping  ?   ?Transfers: ?PT Bed Mobility, Bed to Chair, Car, Furniture  ?OT Toilet, Tub/Shower  ? ?  Locomotion: ?PT Ambulation, Wheelchair Mobility, Stairs  ? ?  Additional Impairments: ?OT Fuctional Use of Upper Extremity  ?SLP Swallowing ?  ?   ?TR    ? ? ?Anticipated Outcomes ?Item Anticipated Outcome  ?Self Feeding S  ?Swallowing ? Supervision ?  ?Basic self-care ? S  ?Toileting ? S ?  ?Bathroom Transfers S  ?Bowel/Bladder ? min assist  ?Transfers ? supervision using LRAD  ?Locomotion ? supervision using LRAD >100 feet  ?Communication ?    ?Cognition ? Min A  ?Pain ? n/a  ?Safety/Judgment ? min assist  ? ?Therapy Plan: ?PT Intensity: Minimum of 1-2 x/day ,45 to 90 minutes ?PT Frequency: 5 out of 7 days ?PT Duration Estimated Length of Stay: 10-12 days ?OT Intensity: Minimum of 1-2 x/day, 45 to 90 minutes ?OT Frequency: 5 out of 7 days ?OT Duration/Estimated Length of Stay: 10-14 ?SLP Intensity: Minumum of 1-2 x/day, 30 to 90 minutes ?SLP Frequency: 3 to 5 out of 7 days ?SLP Duration/Estimated Length of Stay: 9-12 days  ? ?Due to the current state of emergency, patients may not be receiving their 3-hours of Medicare-mandated therapy. ? ? Team Interventions: ?Nursing Interventions Patient/Family Education, Bladder Management,  Bowel Management, Disease Management/Prevention, Skin Care/Wound Management, Cognitive Remediation/Compensation, Discharge Planning  ?PT interventions Ambulation/gait training, Cognitive remediation/compensation, Discharge planning, DME/adaptive equipment instruction, Functional mobility training, Pain management, Psychosocial support, Splinting/orthotics, Therapeutic Activities, UE/LE Strength taining/ROM, Visual/perceptual remediation/compensation, Training and development officer, Community reintegration, Disease management/prevention, Functional electrical stimulation, Neuromuscular re-education, Patient/family education, Skin care/wound management, Stair training, Therapeutic Exercise, UE/LE Coordination activities, Wheelchair propulsion/positioning  ?OT Interventions Balance/vestibular training, Discharge planning, Pain management, Self Care/advanced ADL retraining, Therapeutic Activities, UE/LE Coordination activities, Visual/perceptual remediation/compensation, Therapeutic Exercise, Skin care/wound managment, Patient/family education, Functional mobility training, Disease mangement/prevention, Cognitive remediation/compensation, Community reintegration, DME/adaptive equipment instruction, Neuromuscular re-education, Psychosocial support, UE/LE Strength taining/ROM  ?SLP Interventions Cognitive remediation/compensation, Dysphagia/aspiration precaution training, Functional tasks, Internal/external aids, Medication managment, Patient/family education  ?TR Interventions    ?SW/CM Interventions Discharge Planning, Psychosocial Support, Patient/Family Education  ? ?Barriers to Discharge ?MD  Medical stability  ?Nursing Decreased caregiver support, Home environment access/layout, Incontinence, Wound Care, Lack of/limited family support ?Frequent falls at home. 1 level, 2 steps, 2 rails. Family to provide care at discharge. Husband 73 with mobility issues, daughter receiving cancer treatment.  ?PT Decreased caregiver  support, Lack of/limited family support, Behavior, Incontinence ?   ?OT Decreased caregiver support, Incontinence ?   ?SLP   ?   ?SW   ?   ? ?Team Discharge Planning: ?Destination: PT-Home ,OT- Home , SLP-Home ?Projected Follow-up: PT-Home health PT, OT-  Home health OT, SLP-Home Health SLP, Outpatient SLP ?Projected Equipment Needs: PT-To be determined, OT- Tub/shower bench, SLP-To be determined ?Equipment Details: PT-patient has RW  and SPC, OT-  ?Patient/family involved in discharge planning: PT- Patient,  OT-Patient, SLP-Patient ? ?MD ELOS: 10-12 days ?Medical Rehab Prognosis:  Excellent ?Assessment: The patient has been admitted for CIR therapies with the diagnosis of right SDH after fall. The team will be addressing functional mobility, strength, stamina, balance, safety, adaptive techniques and equipment, self-care, bowel and bladder mgt, patient and caregiver education, NMR, cognition, pain mgt, swallowing. Goals have been set at supervision to min assist with mobility, self-care and cognition. Anticipated discharge destination is home. ? ?Due to the current state of emergency, patients may not be receiving their 3 hours per day of Medicare-mandated therapy.  ? ? ?Meredith Staggers, MD, FAAPMR   ? ? ?See Team Conference Notes for weekly updates to the plan of care  ?

## 2021-12-26 NOTE — Evaluation (Addendum)
Speech Language Pathology Assessment and Plan ? ?Patient Details  ?Name: Kayla Gray ?MRN: [id number] ?Date of Birth: 20-Jun-1936 ? ?SLP Diagnosis: Cognitive Impairments;Dysphagia  ?Rehab Potential: Good ?ELOS: 9-12 days  ? ? ?Today's Date: 12/26/2021 ?SLP Individual Time: 0900-1000 ?SLP Individual Time Calculation (min): 60 min ? ? ?Hospital Problem: Principal Problem: ?  SDH (subdural hematoma) ? ?Past Medical History:  ?Past Medical History:  ?Diagnosis Date  ? Aortic stenosis   ? Bilateral cataracts   ? Bleeding nose   ? Thurs night (09/08/15) and Fri (09/09/15) left side.Bleeding didn't last long  ? Breast cancer ([place]) 1999  ? right breast lumpectomy and rad tx  ? Chronic diastolic (congestive) heart failure (HCC)   ? Depression   ? Diverticulosis   ? Gastroesophageal reflux disease   ? Heart murmur   ? Hyperlipidemia   ? Hypertension   ? Iron deficiency anemia   ? Obstructive sleep apnea   ? Osteoarthritis   ? Osteopenia   ? Peptic ulcer disease   ? Personal history of radiation therapy   ? S/P aortic valve replacement with bioprosthetic valve 09/16/2015  ? 23 mm Edwards Intuity Elite bioprosthetic tissue valve  ? Skin cancer 2020  ? Stroke [facility])   ? 11/14/13  ? Supraventricular tachycardia ([place])   ? Syncope and collapse   ? Urinary tract infection   ? ?Past Surgical History:  ?Past Surgical History:  ?Procedure Laterality Date  ? AORTIC VALVE REPLACEMENT N/A 09/16/2015  ? Procedure: AORTIC VALVE REPLACEMENT (AVR);  Surgeon: Rexene Alberts, MD;  Location: [facility];  Service: Open Heart Surgery;  Laterality: N/A;  ? BREAST BIOPSY Right 1999  ? breast ca radation  ? BREAST EXCISIONAL BIOPSY Left yrs ago   ? benign  ? BREAST LUMPECTOMY Right 1999  ? with radiation  ? CARDIAC CATHETERIZATION N/A 08/17/2015  ? Procedure: Left Heart Cath and Coronary Angiography;  Surgeon: Minna Merritts, MD;  Location: [facility] [facility];  Service: Cardiovascular;  Laterality: N/A;  ? CATARACT EXTRACTION, BILATERAL    ?  CHOLECYSTECTOMY    ? CRANIOTOMY Right 12/18/2021  ? Procedure: CRANIOTOMY HEMATOMA EVACUATION SUBDURAL;  Surgeon: Meade Maw, MD;  Location: [facility] OR[facility];  Service: Neurosurgery;  Laterality: Right;  ? JOINT REPLACEMENT Bilateral   ? knee replacement  ? KNEE ARTHROSCOPY    ? right  ? KNEE ARTHROSCOPY    ? left  ? SKIN CANCER EXCISION  2020  ? TEE WITHOUT CARDIOVERSION N/A 09/16/2015  ? Procedure: TRANSESOPHAGEAL ECHOCARDIOGRAM (TEE);  Surgeon: Rexene Alberts, MD;  Location: [place];  Service: Open Heart Surgery;  Laterality: N/A;  ? TONSILLECTOMY    ? VAGINAL HYSTERECTOMY    ? ? ?Assessment / Plan / Recommendation ?Clinical Impression HPI: Kayla Gray is an 86 year old female with history of HTN, iron deficiency anemia, chronic dCHF, breast cancer, SVT who was admitted to [facility] on 12/18/21 after fall (while mopping her floor), striking back of her head with syncope. She was noted to have hematoma on the back of her head, reported HA and was vomiting at admission. CT head done revealing 1.3 cm R-SDH along falx and right tentorium with 1 cm leftward midline shift and moderate size occipital scalp hematoma. She was taken to OR for right crani for evacuation of SDH with placement of SD drain by Dr. Cari Caraway the same Gray. Post op with lethargy with ongoing HA and EEG done revealing right frontal cortical dysfunction with  moderate diffuse encephalopathy  and was negative for seizures. Admitted to CIR on 12/25/2021 for further medical and therapeutic intervention. ? ?Per standardized screening instrument (COGNISTAT) and skilled + clinical assessment, pt presents with at least mild-moderate deficits in functional safety awareness + problem-solving, as well as severe impairment in delayed recall, word finding related to retrieval/recall deficits, and verbal abstract reasoning. Additionally, pt demonstrates poor intellectual awareness; prior to initiation of cognitive tasks, pt denies changes in cognitive skills in the  setting of R SDH with evacuation. However, once the assessment began and pt demonstrated difficulty with task completion, she verbalized she was experiencing unanticipated difficulty. Conversely, receptive, expressive, social pragmatics appeared grossly WFL. Speech fluent and intelligible at the conversational level. Assessment concluded due to pt reported fatigue. SLP provided pt with skilled education re: results of evaluation, proposed ST plan of care, and rationale/importance of therapy; she verbalized understanding. No caregiver/spouse present. ? ?Re: swallowing, bedside swallow evaluation completed. Per chart review, pt tolerating soft textures and thin liquids at [facility] on 12/22/2021. Oromotor exam appeared unremarkable for focal deficits. When given thin liquid (water) via straw, pt presented with immediate cough, cyanosis, and apnea (<10 sec). With verbal cues, pt able to produce strong cough response to clear her airway. Vitals obtained with SpO2 reading at 98% and HR at 89. Once pt recovered, SLP provided controlled trials of thin liquid (water) vis tsp and cup. Inconsistent s/sx remained present to include throat clearing and coughing; however, unclear if this was residual from suspected aspiration of water via straw. Given s/sx concerning for aspiration with thin liquid, trialed nectar-thick liquid via cup and straw with no overt s/sx of aspiration. Accepted puree via spoon with throat clear x 1. No overt difficulty with mechanical soft textures and demonstrated functional and adequate mastication, bolus control, and timely A-P transit. At this time, recommend initiation of Dysphagia 3 textures with nectar-thick liquids given adherence to aspiration precautions to include slow rate, small + single bites/sips, and limiting distractions. Will plan for instrumental swallow study (MBSS) to further assess biomechanics of pt's deglutition and determine least restrictive diet.  ? ?Given assessment findings, pt  would benefit from skilled ST intervention targeting dysphagia and cognitive-linguistic skills during CIR admission. Pt agreeable with plan. SLP will plan to initiate treatment.  ?  ?Skilled Therapeutic Interventions          Skilled ST intervention to address dysphagia management and cognitive-linguistic skills indicated during CIR admission  ?  ?SLP Assessment ? Patient will need skilled Speech Lanaguage Pathology Services during CIR admission  ?  ?Recommendations ? SLP Diet Recommendations: Dysphagia 3 (Mech soft);Nectar ?Liquid Administration via: Cup;Straw ?Medication Administration: Crushed with puree ?Supervision: Full supervision/cueing for compensatory strategies ?Compensations: Minimize environmental distractions;Slow rate;Small sips/bites ?Postural Changes and/or Swallow Maneuvers: Seated upright 90 degrees;Upright 30-60 min after meal ?Oral Care Recommendations: Oral care BID ?Patient destination: Home ?Follow up Recommendations: Home Health SLP;Outpatient SLP ?Equipment Recommended: To be determined  ?  ?SLP Frequency 3 to 5 out of 7 days   ?SLP Duration ? ?SLP Intensity ? ?SLP Treatment/Interventions 9-12 days ? ?Minumum of 1-2 x/Gray, 30 to 90 minutes ? ?Cognitive remediation/compensation;Dysphagia/aspiration precaution training;Functional tasks;Internal/external aids;Medication managment;Patient/family education   ? ?Pain ?Pain Assessment ?Pain Score: 0-No pain ? ?Prior Functioning ?Cognitive/Linguistic Baseline: Independent with most iADL tasks with the exception of financial management, which her husband manages.  ?Type of Home: House ? Lives With: Spouse ?Available Help at Discharge: Available PRN/intermittently ?Vocation: Retired ? ?SLP Evaluation ?Cognition ?Overall Cognitive Status: Impaired/Different from baseline ?Arousal/Alertness:  Awake/alert ? ?Orientation Level: Oriented to person;Oriented to place;Oriented to time;Oriented to situation (COGNISTAT orientation subtest - 11/12 [facility]) ?Year:  2023 ?Month: March ?Gray of Week: Incorrect ? ?Attention: Selective (COGNISTAT attention subtest - 7/8 - WFL) ?Selective Attention: Impaired  ?Selective Attention Impairment: Verbal basic;Functional basic ? ?Memory:

## 2021-12-26 NOTE — Patient Care Conference (Signed)
Inpatient RehabilitationTeam Conference and Plan of Care Update ?Date: 12/26/2021   Time: 10:18 AM  ? ? ?Patient Name: Kayla Gray      ?Medical Record Number: [id number]  ?Date of Birth: Aug 23, 1936 ?Sex: Female         ?Room/Bed: 3J00X/3G18E-99 ?Payor Info: Payor: [employer] / Plan: [employer] PPO / Product Type: *No Product type* /   ? ?Admit Date/Time:  12/25/2021  2:11 PM ? ?Primary Diagnosis:  SDH (subdural hematoma) ? ?Hospital Problems: Principal Problem: ?  SDH (subdural hematoma) ? ? ? ?Expected Discharge Date: Expected Discharge Date:  (9-12 days) ? ?Team Members Present: ?Physician leading conference: Dr. Alger Simons ?Social Worker Present: Loralee Pacas, LCSWA ?Nurse Present: Dorthula Nettles, RN ?PT Present: Apolinar Junes, PT ?OT Present: Laverle Hobby, OT ?SLP Present: Weston Anna, SLP ?PPS Coordinator present : Gunnar Fusi, SLP ? ?   Current Status/Progress Goal Weekly Team Focus  ?Bowel/Bladder ? ? Pt is continent/incontinent of bowel/bladder  Pt will become continent of bowel/bladder  Will assess qshift and PRN   ?Swallow/Nutrition/ Hydration ? ? Eval Pending         ?ADL's ? ? min A overall, poor safety awareness, processing  supervision  d/c planning, ADL, transfers, cognitive retraining   ?Mobility ? ? Eval Pending         ?Communication ? ? Eval Pending         ?Safety/Cognition/ Behavioral Observations ? Eval Pending         ?Pain ? ? Pt is currently pain free  Pt will remain pain free  Will assess qshift and PRN   ?Skin ? ? Pt has an incision to the right of the head that is closed w/staples  Pt's incision will heal and staples will be removed  Will assess qshift and PRN   ? ? ?Discharge Planning:  ?To Be assessed.   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 Fall at home while mopping floor. Current SDH with crani. Patient is aware she is not herself. Incontinent B/B, no pain reported, right crani incision. Husband is 23 and  daughter is receiving cancer treatment.  ? ?Patient on target to meet rehab goals: ?yes, supervision goals. Currently min assist transfers, decreased grasp in LUE. PT eval pending. Aspiration on thins, severe coughing, placed on Nectar this liquids. MBS scheduled for Wednesday. Cognition poor, safety poor. ? ?*See Care Plan and progress notes for long and short-term goals.  ? ?Revisions to Treatment Plan:  ?PT eval pending, Behavior plan scheduled. ?  ?Teaching Needs: ?Family education, medication management, bowel/bladder management, skin/wound care, transfer/gait training, etc. ?  ?Current Barriers to Discharge: ?Decreased caregiver support, Home enviroment access/layout, Incontinence, Wound care, and Lack of/limited family support ? ?Possible Resolutions to Barriers: ?Family education ?MBS scheduled ?Follow-up PT/OT/SLP ?  ? ? Medical Summary ?Current Status: right SDH s/p craniotomy. minimal pain. hx of cva with mild residual left hp. incontinent ? Barriers to Discharge: Medical stability ?  ?Possible Resolutions to [employer]: daily assessment of labs and pt data. mazimize sleep and nutrition ? ? ?Continued Need for Acute Rehabilitation Level of Care: The patient requires daily medical management by a physician with specialized training in physical medicine and rehabilitation for the following reasons: ?Direction of a multidisciplinary physical rehabilitation program to maximize functional independence : Yes ?Medical management of patient stability for increased activity during participation in an intensive rehabilitation regime.: Yes ?Analysis of laboratory values and/or radiology reports with any subsequent need  for medication adjustment and/or medical intervention. : Yes ? ? ?I attest that I was present, lead the team conference, and concur with the assessment and plan of the team. ? ? ?Dorthula Nettles G ?12/26/2021, 2:24 PM  ? ? ? ? ? ? ?

## 2021-12-26 NOTE — Evaluation (Signed)
Occupational Therapy Assessment and Plan ? ?Patient Details  ?Name: Kayla Gray ?MRN: [id number] ?Date of Birth: 06-10-1936 ? ?OT Diagnosis: abnormal posture, cognitive deficits, muscle weakness (generalized), and coordination disorder ?Rehab Potential: Rehab Potential (ACUTE ONLY): Good ?ELOS: 10-14  ? ?Today's Date: 12/26/2021 ?OT Individual Time: 0800-0900 ?OT Individual Time Calculation (min): 60 min    ? ?Hospital Problem: Principal Problem: ?  SDH (subdural hematoma) ? ? ?Past Medical History:  ?Past Medical History:  ?Diagnosis Date  ? Aortic stenosis   ? Bilateral cataracts   ? Bleeding nose   ? Thurs night (09/08/15) and Fri (09/09/15) left side.Bleeding didn't last long  ? Breast cancer ([place]) 1999  ? right breast lumpectomy and rad tx  ? Chronic diastolic (congestive) heart failure (HCC)   ? Depression   ? Diverticulosis   ? Gastroesophageal reflux disease   ? Heart murmur   ? Hyperlipidemia   ? Hypertension   ? Iron deficiency anemia   ? Obstructive sleep apnea   ? Osteoarthritis   ? Osteopenia   ? Peptic ulcer disease   ? Personal history of radiation therapy   ? S/P aortic valve replacement with bioprosthetic valve 09/16/2015  ? 23 mm Edwards Intuity Elite bioprosthetic tissue valve  ? Skin cancer 2020  ? Stroke [facility])   ? 11/14/13  ? Supraventricular tachycardia ([place])   ? Syncope and collapse   ? Urinary tract infection   ? ?Past Surgical History:  ?Past Surgical History:  ?Procedure Laterality Date  ? AORTIC VALVE REPLACEMENT N/A 09/16/2015  ? Procedure: AORTIC VALVE REPLACEMENT (AVR);  Surgeon: Rexene Alberts, MD;  Location: [place];  Service: Open Heart Surgery;  Laterality: N/A;  ? BREAST BIOPSY Right 1999  ? breast ca radation  ? BREAST EXCISIONAL BIOPSY Left yrs ago   ? benign  ? BREAST LUMPECTOMY Right 1999  ? with radiation  ? CARDIAC CATHETERIZATION N/A 08/17/2015  ? Procedure: Left Heart Cath and Coronary Angiography;  Surgeon: Minna Merritts, MD;  Location: [place] [facility];  Service:  Cardiovascular;  Laterality: N/A;  ? CATARACT EXTRACTION, BILATERAL    ? CHOLECYSTECTOMY    ? CRANIOTOMY Right 12/18/2021  ? Procedure: CRANIOTOMY HEMATOMA EVACUATION SUBDURAL;  Surgeon: Meade Maw, MD;  Location: [facility];  Service: Neurosurgery;  Laterality: Right;  ? JOINT REPLACEMENT Bilateral   ? knee replacement  ? KNEE ARTHROSCOPY    ? right  ? KNEE ARTHROSCOPY    ? left  ? SKIN CANCER EXCISION  2020  ? TEE WITHOUT CARDIOVERSION N/A 09/16/2015  ? Procedure: TRANSESOPHAGEAL ECHOCARDIOGRAM (TEE);  Surgeon: Rexene Alberts, MD;  Location: [place];  Service: Open Heart Surgery;  Laterality: N/A;  ? TONSILLECTOMY    ? VAGINAL HYSTERECTOMY    ? ? ?Assessment & Plan ?Clinical Impression:   [name]. Langton is an 86 year old female with history of HTN, iron deficiency anemia, chronic dCHF, breast cancer, SVT who was admitted to [facility] on 12/18/21 after fall (while mopping her floor), striking back of her head with syncope. She was noted to have hematoma on the back of her head, reported HA and was vomiting at admission. CT head done revealing 1.3 cm R-SDH along falx and right tentorium with 1 cm leftward midline shift and moderate size occipital scalp hematoma. She was taken to OR for right crani for evacuation of SDH with placement of SD drain by Dr. Cari Caraway the same day. Post op with lethargy with ongoing HA and EEG done  revealing right frontal cortical dysfunction with  moderate diffuse encephalopathy and was negative for seizures.  ?  ?AMS felt to be due to narcotics which was discontinued and follow up CT head stable post op changes with blood extending along dorsal and ventral aspects of craniocervical junction into upper cervical spine surrounding cord. Cervical MRI done for work up showing extramedullary hemorrhage extending to C5 with mild to moderate narrowing of upper cervical canal without mass effect and moderate advanced disc degeneration C6-C7 with severe bilateral neural foraminal narrowing. MRI  Brain showed stable bleed with acute ischemia right superior frontal gyrus and right insular cortex. 2D echo showed EF 60-65%, mild MVR and functioning bioprosthetic AV.  Mentation has improved and cardiology consulted for input due to history of PAF. ZIO patch recommended to rule out cardiac source. BP has been labile and Losartan added for better control. Therapy ongoing and patient limited by weakness, balance deficits and need increased time for processing. CIR recommended due to functional decline.    ?  ? ? ?Patient currently requires mod with basic self-care skills secondary to muscle weakness, decreased cardiorespiratoy endurance, impaired timing and sequencing and decreased coordination, decreased visual perceptual skills, decreased attention to left, decreased awareness, decreased problem solving, decreased safety awareness, decreased memory, delayed processing, and demonstrates behaviors consistent with [place] 7, and decreased sitting balance, decreased standing balance, decreased postural control, and difficulty maintaining precautions.  Prior to hospitalization, patient could complete BADL/IADL with modified independent . ? ?Patient will benefit from skilled intervention to increase independence with basic self-care skills and increase level of independence with iADL prior to discharge home with care partner.  Anticipate patient will require 24 hour supervision and follow up home health. ? ?OT - End of Session ?Activity Tolerance: Tolerates 30+ min activity with multiple rests ?Endurance Deficit: Yes ?Endurance Deficit Description: requires multiple rest breaks with minimal activity ?OT Assessment ?Rehab Potential (ACUTE ONLY): Good ?OT Barriers to Discharge: Decreased caregiver support;Incontinence ?OT Patient demonstrates impairments in the following area(s): Balance;Cognition;Endurance;Motor;Nutrition;Pain;Perception;Safety;Sensory;Vision ?OT Basic ADL's Functional Problem(s):  Grooming;Bathing;Dressing;Toileting ?OT Advanced ADL's Functional Problem(s): Simple Meal Preparation;Laundry;Light Housekeeping ?OT Transfers Functional Problem(s): Toilet;Tub/Shower ?OT Additional Impairment(s): Fuctional Use of Upper Extremity ?OT Plan ?OT Intensity: Minimum of 1-2 x/day, 45 to 90 minutes ?OT Frequency: 5 out of 7 days ?OT Duration/Estimated Length of Stay: 10-14 ?OT Treatment/Interventions: Balance/vestibular training;Discharge planning;Pain management;Self Care/advanced ADL retraining;Therapeutic Activities;UE/LE Coordination activities;Visual/perceptual remediation/compensation;Therapeutic Exercise;Skin care/wound managment;Patient/family education;Functional mobility training;Disease mangement/prevention;Cognitive remediation/compensation;Community reintegration;DME/adaptive equipment instruction;Neuromuscular re-education;Psychosocial support;UE/LE Strength taining/ROM ?OT Self Feeding Anticipated Outcome(s): S ?OT Basic Self-Care Anticipated Outcome(s): S ?OT Toileting Anticipated Outcome(s): S ?OT Bathroom Transfers Anticipated Outcome(s): S ?OT Recommendation ?Patient destination: Home ?Follow Up Recommendations: Home health OT ?Equipment Recommended: Tub/shower bench ? ? ?OT Evaluation ?Precautions/Restrictions  ?Precautions ?Precautions: Fall ?Restrictions ?Weight Bearing Restrictions: No ?General ?Chart Reviewed: Yes ?Family/Caregiver Present: No ?Vital Signs ?  ?Pain ?Pain Assessment ?Pain Score: 0-No pain ?Home Living/Prior Functioning ?Home Living ?Family/patient expects to be discharged to:: Private residence ?Living Arrangements: Spouse/significant other ?Available Help at Discharge: Available PRN/intermittently ?Type of Home: House ?Home Access: Stairs to enter ?Entrance Stairs-Number of Steps: 4 per chair, 6 per pt ?Entrance Stairs-Rails: Right ?Home Layout: One level ?Bathroom Shower/Tub: Tub/shower unit ?Bathroom Toilet: Standard ?Additional Comments: Pt utilizes SPC in  community and no AD at home ? Lives With: Spouse ?IADL History ?Homemaking Responsibilities: Yes ?Meal Prep Responsibility: Primary ?Laundry Responsibility: Primary ?Cleaning Responsibility: Primary ?Bill Paying/Finance Responsi

## 2021-12-26 NOTE — Plan of Care (Signed)
?  Problem: RH Swallowing ?Goal: LTG Patient will consume least restrictive diet using compensatory strategies with assistance (SLP) ?Description: LTG:  Patient will consume least restrictive diet using compensatory strategies with assistance (SLP) ?Flowsheets (Taken 12/26/2021 1200) ?LTG: Pt Patient will consume least restrictive diet using compensatory strategies with assistance of (SLP): Supervision ?Goal: LTG Pt will demonstrate functional change in swallow as evidenced by bedside/clinical objective assessment (SLP) ?Description: LTG: Patient will demonstrate functional change in swallow as evidenced by bedside/clinical objective assessment (SLP) ?Flowsheets (Taken 12/26/2021 1200) ?LTG: Patient will demonstrate functional change in swallow as evidenced by bedside/clinical objective assessment: Oropharyngeal swallow ?  ?Problem: RH Problem Solving ?Goal: LTG Patient will demonstrate problem solving for (SLP) ?Description: LTG:  Patient will demonstrate problem solving for basic/complex daily situations with cues  (SLP) ?Flowsheets (Taken 12/26/2021 1200) ?LTG: Patient will demonstrate problem solving for (SLP): Basic daily situations ?LTG Patient will demonstrate problem solving for: Minimal Assistance - Patient > 75% ?  ?Problem: RH Memory ?Goal: LTG Patient will demonstrate ability for day to day (SLP) ?Description: LTG:   Patient will demonstrate ability for day to day recall/carryover during cognitive/linguistic activities with assist  (SLP) ?Flowsheets (Taken 12/26/2021 1200) ?LTG: Patient will demonstrate ability for day to day recall: New information ?LTG: Patient will demonstrate ability for day to day recall/carryover during cognitive/linguistic activities with assist (SLP): Minimal Assistance - Patient > 75% ?Goal: LTG Patient will use memory compensatory aids to (SLP) ?Description: LTG:  Patient will use memory compensatory aids to recall biographical/new, daily complex information with cues  (SLP) ?Flowsheets (Taken 12/26/2021 1200) ?LTG: Patient will use memory compensatory aids to (SLP): Minimal Assistance - Patient > 75% ?  ?Problem: RH Attention ?Goal: LTG Patient will demonstrate this level of attention during functional activites (SLP) ?Description: LTG:  Patient will will demonstrate this level of attention during functional activites (SLP) ?Flowsheets (Taken 12/26/2021 1200) ?Patient will demonstrate during cognitive/linguistic activities the attention type of: Selective ?Patient will demonstrate this level of attention during cognitive/linguistic activities in: Home ?LTG: Patient will demonstrate this level of attention during cognitive/linguistic activities with assistance of (SLP): Minimal Assistance - Patient > 75% ?Number of minutes patient will demonstrate attention during cognitive/linguistic activities: 15 ?  ?Problem: RH Awareness ?Goal: LTG: Patient will demonstrate awareness during functional activites type of (SLP) ?Description: LTG: Patient will demonstrate awareness during functional activites type of (SLP) ?Flowsheets (Taken 12/26/2021 1200) ?Patient will demonstrate during cognitive/linguistic activities awareness type of: Intellectual ?LTG: Patient will demonstrate awareness during cognitive/linguistic activities with assistance of (SLP): Minimal Assistance - Patient > 75% ?  ?

## 2021-12-26 NOTE — Progress Notes (Signed)
Patient ID: Kayla Gray, female   DOB: 21-Feb-1936, 86 y.o.   MRN: [id number] ? ?SW met with pt in room to provide updates from team conference and ELOS 9-12 days and will inform once d/c date set. Pt dtr [name] called while SW in room. SW spoke with her and provided same updates. She confirmed it will just be her mother and stepfather at home and no additional supports. Pt reports her husband can drive her to appointments if needed. SW informed will provide updates once more information.  ? ?Loralee Pacas, MSW, LCSWA ?Office: [phone] ?Cell: [phone] ?Fax: [phone]  ?

## 2021-12-27 ENCOUNTER — Inpatient Hospital Stay (HOSPITAL_COMMUNITY): Payer: PPO

## 2021-12-27 LAB — GLUCOSE, CAPILLARY: Glucose-Capillary: 112 mg/dL — ABNORMAL HIGH (ref 70–99)

## 2021-12-27 NOTE — Progress Notes (Signed)
Occupational Therapy Session Note ? ?Patient Details  ?Name: Kayla Gray ?MRN: [id number] ?Date of Birth: 06/18/1936 ? ?Today's Date: 12/27/2021 ?OT Individual Time: 0945-1045 ?OT Individual Time Calculation (min): 60 min  ? ? ?Short Term Goals: ?Week 1:  OT Short Term Goal 1 (Week 1): Pt will complete ambualtory toilet transfers iwht CGA ?OT Short Term Goal 2 (Week 1): Pt will complete TTB transfer with CGA and min cuing for sequencing ?OT Short Term Goal 3 (Week 1): Pt will complete ADL with no grip slips out of LUE to demo improved attention, grasp and coordination ?OT Short Term Goal 4 (Week 1): Pt will groom in standing wiht CGA ? ?Skilled Therapeutic Interventions/Progress Updates:  ?   ?Pt received in bed with no pain ? ?Therapeutic activity ?Bits in standing: memory up to 5 words in order, Bells cancelation test with top>bottom R>L scanning pattern in 3 min 7 sec no misses, and geo boards with 25% accuracy on level 1-3 ? ?Pt completes kitchen search into various height cabinets/appliaces in prep for IADL retraining from ambulatory RW level with mod cuing for safety/positioning throughout environment. Pt watched OT place fruit in cabinet but unable to recall any of the locations. Pt missed 4/7 fruit requiring cuing for awarenss that Pt did not look in any of the L cabinets. Discussed implications for cooking at home.  ? ?Pt spreads bed comforter at amb level with no AD with decreased room provided to step L foot around the foot of the bed nearly tripping 2x. Edu re L attention and fall risk. Pt unable to recall what drawer OT said comforter was in ? ?Pt left at end of session in bed with exit alarm on, call light in reach and all needs met ? ? ?Therapy Documentation ?Precautions:  ?Precautions ?Precautions: Fall ?Restrictions ?Weight Bearing Restrictions: No ? ?Therapy/Group: Individual Therapy ? ? M  ?12/27/2021, 6:54 AM ?

## 2021-12-27 NOTE — Progress Notes (Signed)
?                                                       PROGRESS NOTE ? ? ?Subjective/Complaints: ?Pretty good night. Up in bathroom with OT. Talked about choking spell yesterday. She says it happens a lot at home.  ? ?ROS: Patient denies fever, rash, sore throat, blurred vision, dizziness, nausea, vomiting, diarrhea, cough, shortness of breath or chest pain, joint or back/neck pain, headache, or mood change.  ? ? ?Objective: ?  ?No results found. ?Recent Labs  ?  12/26/21 ?0527  ?WBC 6.6  ?HGB 9.9*  ?HCT 30.1*  ?PLT 147*  ? ?Recent Labs  ?  12/26/21 ?0527  ?NA 139  ?K 3.7  ?CL 106  ?CO2 28  ?GLUCOSE 102*  ?BUN 11  ?CREATININE 0.86  ?CALCIUM 8.5*  ? ? ?Intake/Output Summary (Last 24 hours) at 12/27/2021 0934 ?Last data filed at 12/26/2021 1851 ?Gross per 24 hour  ?Intake 358 ml  ?Output --  ?Net 358 ml  ?  ? ?  ? ?Physical Exam: ?Vital Signs ?Blood pressure (!) 144/72, pulse 77, temperature 98.6 ?F (37 ?C), resp. rate 16, height '5\' 2"'$  (1.575 m), weight 75.6 kg, SpO2 95 %. ? ?Constitutional: No distress . Vital signs reviewed. ?HEENT: NCAT, EOMI, oral membranes moist ?Neck: supple ?Cardiovascular: RRR without murmur. No JVD    ?Respiratory/Chest: CTA Bilaterally without wheezes or rales. Normal effort    ?GI/Abdomen: BS +, non-tender, non-distended ?Ext: no clubbing, cyanosis, or edema ?Psych: pleasant and cooperative  ?Skin: Clean and intact without signs of breakdown ?Neuro:  Alert and oriented x 3. fair insight and awareness. Intact Memory. Normal language and speech. Cranial nerve exam unremarkable. Mild left sided weakness. RUE and RLE 5/5. Sensory exam normal for light touch and pain in all 4 limbs. No limb ataxia or cerebellar signs. No abnormal tone appreciated.   ?Musculoskeletal: no pain. Old TKA incisions  ? ? ?Assessment/Plan: ?1. Functional deficits which require 3+ hours per day of interdisciplinary therapy in a comprehensive inpatient rehab setting. ?Physiatrist is providing close team supervision and  24 hour management of active medical problems listed below. ?Physiatrist and rehab team continue to assess barriers to discharge/monitor patient progress toward functional and medical goals ? ?Care Tool: ? ?Bathing ?   ?Body parts bathed by patient: Right arm, Left arm, Chest, Abdomen, Front perineal area, Buttocks, Right upper leg, Left upper leg, Face  ? Body parts bathed by helper: Right lower leg, Left lower leg ?  ?  ?Bathing assist Assist Level: Minimal Assistance - Patient > 75% ?  ?  ?Upper Body Dressing/Undressing ?Upper body dressing   ?What is the patient wearing?: Pull over shirt ?   ?Upper body assist Assist Level: Moderate Assistance - Patient 50 - 74% ?   ?Lower Body Dressing/Undressing ?Lower body dressing ? ? ?   ?What is the patient wearing?: Underwear/pull up ? ?  ? ?Lower body assist Assist for lower body dressing: Minimal Assistance - Patient > 75% ?   ? ?Toileting ?Toileting    ?Toileting assist Assist for toileting: Minimal Assistance - Patient > 75% ?Assistive Device Comment: walker and contact guard ?  ?Transfers ?Chair/bed transfer ? ?Transfers assist ?   ? ?Chair/bed transfer assist level: Minimal Assistance - Patient > 75% ?  ?  ?  Locomotion ?Ambulation ? ? ?Ambulation assist ? ?   ? ?Assist level: Minimal Assistance - Patient > 75% ?Assistive device: Hand held assist ?Max distance: 60 ft  ? ?Walk 10 feet activity ? ? ?Assist ?   ? ?Assist level: Minimal Assistance - Patient > 75% ?Assistive device: Hand held assist  ? ?Walk 50 feet activity ? ? ?Assist   ? ?Assist level: Minimal Assistance - Patient > 75% ?Assistive device: Hand held assist  ? ? ?Walk 150 feet activity ? ? ?Assist Walk 150 feet activity did not occur: Safety/medical concerns ? ?  ?  ?  ? ?Walk 10 feet on uneven surface  ?activity ? ? ?Assist Walk 10 feet on uneven surfaces activity did not occur: Safety/medical concerns ? ? ?  ?   ? ?Wheelchair ? ? ? ? ?Assist Is the patient using a wheelchair?: Yes ?Type of Wheelchair:  Manual ?  ? ?Wheelchair assist level: Total Assistance - Patient < 25% ?Max wheelchair distance: 150 ft  ? ? ?Wheelchair 50 feet with 2 turns activity ? ? ? ?Assist ? ?  ?  ? ? ?Assist Level: Total Assistance - Patient < 25%  ? ?Wheelchair 150 feet activity  ? ? ? ?Assist ?   ? ? ?Assist Level: Total Assistance - Patient < 25%  ? ?Blood pressure (!) 144/72, pulse 77, temperature 98.6 ?F (37 ?C), resp. rate 16, height '5\' 2"'$  (1.575 m), weight 75.6 kg, SpO2 95 %. ? ?Medical Problem List and Plan: ?1. Functional deficits secondary to right SDH after fall. Pt s/p craniotomy ?            -patient may  shower ?            -ELOS/Goals: 12-14 days, supervision ? -Continue CIR therapies including PT, OT, and SLP  ?2.  Antithrombotics: ?-DVT/anticoagulation:  Pharmaceutical: Lovenox ?            -antiplatelet therapy: N/A due to bleed.  ?3. Pain Management: Tylenol prn.  ?4. Mood: LCSW to follow for evaluation and support.  ?            -antipsychotic agents: N/A ?5. Neuropsych: This patient is capable of making decisions on her own behalf. ?6. Skin/Wound Care: Routine pressure relief measures.  ?7. Fluids/Electrolytes/Nutrition: encourage PO ? -added protein ?8. HTN: Monitor BP TID--continue metoprolol and Losartan.  ?9. Chronic diastolic CHF: Low salt diet.  ?  ?Filed Weights  ? 12/25/21 1437 12/26/21 0500 12/27/21 0458  ?Weight: 76.2 kg 74.7 kg 75.6 kg  ?  ? weights stable.  ?10. Pre-diabetes:  Hgb A1c-5.7. Added low carb restrictions.  ?-fairly well controlled. Can reduce cbg's to AM only  ?--RD consulted for diet education.  ?11. H/o SVT/ PAF: Monitor HR TID--continue metoprolol. Zio patch to be mailed to pt ?            -apparently had a short run of VT prior transfer this morning ? -no issues, sx so far ?12. Seizure prophylaxis: Continue Keppra BID.  ?13. Hyperactive bladder: Now on ditropan.  ?            -pt reports baseline urinary frequency ?14. Post-op anemia ? -hgb 9.9 ? -add fe supp ? -recheck cbc Monday ?15.  Dysphagia: ? -pt coughed and choked on thin liquids ? -pt now on D3/nectars ? -MBS today ? -pt downplayed the episode as she says it's been going on a long time, perhaps since her stroke ?  ? ?LOS: ?2 days ?  A FACE TO FACE EVALUATION WAS PERFORMED ? ?Meredith Staggers ?12/27/2021, 9:34 AM  ? ?  ?

## 2021-12-27 NOTE — Progress Notes (Signed)
Modified Barium Swallow Progress Note ? ?Patient Details  ?Name: Kayla Gray ?MRN: [id number] ?Date of Birth: November 14, 1935 ? ?Today's Date: 12/27/2021 ? ?Modified Barium Swallow completed.  Full report located under Chart Review in the Imaging Section. ? ?Brief recommendations include the following: ? ?Clinical Impression ?Per instrumental swallow study (MBSS), pt presents with mild oropharyngeal dysphagia. Oral phase c/b lingual pumping, bolus holding due to poor coordination + timing, decreased bolus cohesion, and delayed A-P transit. Pharyngeal phase c/b mildly decreased BOT retraction resulting in mild vallecular residuals post-swallow. One instances of sensate penetration with subsequent aspiration when consuming large sip of thin liquid via cup. Thin liquid via straw was not assessed due to overt, immediate aspiration sx's during BSE on 12/26/2021. To improve oral timing and pharyngeal protection, SLP provided verbal cues for pt to perform oral hold for 1, 2, 3 and then swallow. Above mentioned strategy was objectively observed to improve pt's overall oral coordination, timeliness of A-P transit, and decreased premature spillage of liquids.  ? ?Giving instrumental findings, recommend initiation of Dysphagia 3 with thin liquids via Provale cup (NO STRAWS) with adherence to aspiration precautions of slow rate, alert and upright, small + single bites/sips, use of Provale cup, and limiting distractions. May prompt oral hold with verbal cues "1, 2, 3, swallow." Pt provided with verbal recommendations and verbalized understanding, though will need reinforcement for adherence to precautions. RN notified and pt verbalized understanding.  ?  ?Swallow Evaluation Recommendations ? SLP Diet Recommendations: Dysphagia 3 (Mech soft) solids;Thin liquid (NO STRAWS) ? ? Liquid Administration via: No straw;Cup (Provale cup) ? ? Medication Administration: Crushed with puree ? ? Supervision: Full assist for feeding;Patient able to  self feed ? ? Compensations: Minimize environmental distractions;Slow rate;Small sips/bites (Oral hold for 3 seconds with liquids) ? ? Postural Changes: Seated upright at 90 degrees ? ? Oral Care Recommendations: Oral care BID ? ? ? ?Kelwin Gibler A Isidore Margraf ?12/27/2021,11:23 AM ?

## 2021-12-27 NOTE — Progress Notes (Signed)
Physical Therapy TBI Note ? ?Patient Details  ?Name: Kayla Gray ?MRN: [id number] ?Date of Birth: 06-13-1936 ? ?Today's Date: 12/27/2021 ?PT Individual Time: 1300-1400 ?PT Individual Time Calculation (min): 60 min  ? ?Short Term Goals: ?Week 1:  PT Short Term Goal 1 (Week 1): Patient will perform basic transfer with CGA consistently. ?PT Short Term Goal 2 (Week 1): Patient will ambulate >100 feet using LRAD with CGA. ?PT Short Term Goal 3 (Week 1): Patient will perform 6 steps using R rail with min A. ? ?Skilled Therapeutic Interventions/Progress Updates:  ?   ?Patient in bed upon PT arrival. Patient alert and agreeable to PT session. Patient reported 2/10 headache pain during session, RN made aware. PT provided repositioning, rest breaks, and distraction as pain interventions throughout session.  ? ?Educated on symptoms post TBI and general TBI recovery throughout session. ? ?Therapeutic Activity: ?Bed Mobility: Patient performed supine to sit with supervision in a flat bed without use of bed rails. Provided verbal cues for initiation. ?Transfers: Patient performed sit to/from stand from the bed, toilet, and mat table with close supervision using a RW. Provided mod-min verbal cues for hand placement and forward weight shift each trial. ?Patient was continent of bowl and bladder during toileting. Performed peri-care and lower body clothing management, and hand hygiene independently with supervision for standing balance with RW. ? ?Gait Training:  ?Patient ambulated >80 feet and >180 feet using RW with CGA-close supervision. Ambulated with decreased gait speed, decreased step length and height, narrow BOS, forward trunk lean, and downward head gaze. Provided verbal cues for erect posture, safe AD management, and increased BOS for improved balance. ?On second trial had patient name ingredients in her favorite dish and steps necessary to cook the dish with x2 cues for recall of a main ingredient, followed by naming  items starting with the letter A for dual task challenge. Notable decreased gait speed with dual task challenge and increased challenge with coming up with items starting with the letter A after the first 3 items listed.  ?Educated on the benefits of increased number of steps per day to improve cardiovascular and motor learning deficits following TBI. ? ?Neuromuscular Re-ed: ?Patient performed the following activities for improved balance and lower extremity motor control with functional tasks: ?-sit to stand 2x5 with B upper extremity use on first trial and hands on thighs for second trial, focused on forward weight shift for improved balance and reduced retropulsion with standing, required min facilitation for weight shift with hands on thighs ?-ambulating 50 feet with hands on therapist's shoulders to promote forward propulsion for increased gait speed with min A ?Patient reports increased fear of falling since her fall at home PTA, and has difficulty performing tasks that lead to increased balance challenge at this time. Will incorporate progressive balance training and confidence into POC to reduce patient's fear of falling.  ? ?Patient in w/c in the room at end of session with breaks locked, seat belt alarm set, and all needs within reach. Attempted to locate a TIS w/c to improve patient's OOB tolerance. No appropriate TIS for patient available at this time, will continue to work toward OOB tolerance and locating appropriate TIS w/c. ? ?Therapy Documentation ?Precautions:  ?Precautions ?Precautions: Fall ?Restrictions ?Weight Bearing Restrictions: No ?Agitated Behavior Scale: ?TBI ?Observation Details ?Observation Environment: MBS ?Start of observation period - Date: 12/27/21 ?Start of observation period - Time: 1300 ?End of observation period - Date: 12/27/21 ?End of observation period - Time: 1400 ?Agitated Behavior  Scale (DO NOT LEAVE BLANKS) ?Short attention span, easy distractibility, inability to  concentrate: Present to a slight degree ?Impulsive, impatient, low tolerance for pain or frustration: Present to a slight degree ?Uncooperative, resistant to care, demanding: Absent ?Violent and/or threatening violence toward people or property: Absent ?Explosive and/or unpredictable anger: Absent ?Rocking, rubbing, moaning, or other self-stimulating behavior: Absent ?Pulling at tubes, restraints, etc.: Absent ?Wandering from treatment areas: Absent ?Restlessness, pacing, excessive movement: Absent ?Repetitive behaviors, motor, and/or verbal: Absent ?Rapid, loud, or excessive talking: Absent ?Sudden changes of mood: Absent ?Easily initiated or excessive crying and/or laughter: Absent ?Self-abusiveness, physical and/or verbal: Absent ?Agitated behavior scale total score: 16 ? ? ? ?Therapy/Group: Individual Therapy ? ?Doreene Burke PT, DPT ? ?12/27/2021, 3:49 PM  ?

## 2021-12-27 NOTE — Care Management (Signed)
Inpatient Rehabilitation Center ?Individual Statement of Services ? ?Patient Name:  Kayla Gray  ?Date:  12/27/2021 ? ?Welcome to the [facility].  Our goal is to provide you with an individualized program based on your diagnosis and situation, designed to meet your specific needs.  With this comprehensive rehabilitation program, you will be expected to participate in at least 3 hours of rehabilitation therapies Monday-Friday, with modified therapy programming on the weekends. ? ?Your rehabilitation program will include the following services:  Physical Therapy (PT), Occupational Therapy (OT), Speech Therapy (ST), 24 hour per day rehabilitation nursing, Therapeutic Recreaction (TR), Psychology, Neuropsychology, Care Coordinator, Rehabilitation Medicine, Nutrition Services, Pharmacy Services, and Other ? ?Weekly team conferences will be held on Tuesdays to discuss your progress.  Your Inpatient Rehabilitation Care Coordinator will talk with you frequently to get your input and to update you on team discussions.  Team conferences with you and your family in attendance may also be held. ? ?Expected length of stay: 10-14 days   ? ?Overall anticipated outcome: Supervision ? ?Depending on your progress and recovery, your program may change. Your Inpatient Rehabilitation Care Coordinator will coordinate services and will keep you informed of any changes. Your Inpatient Rehabilitation Care Coordinator's name and contact numbers are listed  below. ? ?The following services may also be recommended but are not provided by the [facility]:  ?Driving Evaluations ?Home Health Rehabiltiation Services ?Outpatient Rehabilitation Services ?Vocational Rehabilitation ?  ?Arrangements will be made to provide these services after discharge if needed.  Arrangements include referral to agencies that provide these services. ? ?Your insurance has been verified to be:  [employer] ? ?Your  primary doctor is:  Derinda Late ? ?Pertinent information will be shared with your doctor and your insurance company. ? ?Inpatient Rehabilitation Care Coordinator:  Cathleen Corti [phone] or ([phone] ? ?Information discussed with and copy given to patient by: Rana Snare, 12/27/2021, 11:50 AM    ?

## 2021-12-27 NOTE — Progress Notes (Signed)
Inpatient Rehabilitation Care Coordinator ?Assessment and Plan ?Patient Details  ?Name: Kayla Gray ?MRN: [id number] ?Date of Birth: 1936/01/23 ? ?Today's Date: 12/27/2021 ? ?Hospital Problems: Principal Problem: ?  SDH (subdural hematoma) ? ?Past Medical History:  ?Past Medical History:  ?Diagnosis Date  ? Aortic stenosis   ? Bilateral cataracts   ? Bleeding nose   ? Thurs night (09/08/15) and Fri (09/09/15) left side.Bleeding didn't last long  ? Breast cancer ([facility]) 1999  ? right breast lumpectomy and rad tx  ? Chronic diastolic (congestive) heart failure (HCC)   ? Depression   ? Diverticulosis   ? Gastroesophageal reflux disease   ? Heart murmur   ? Hyperlipidemia   ? Hypertension   ? Iron deficiency anemia   ? Obstructive sleep apnea   ? Osteoarthritis   ? Osteopenia   ? Peptic ulcer disease   ? Personal history of radiation therapy   ? S/P aortic valve replacement with bioprosthetic valve 09/16/2015  ? 23 mm Edwards Intuity Elite bioprosthetic tissue valve  ? Skin cancer 2020  ? Stroke [facility])   ? 11/14/13  ? Supraventricular tachycardia ([facility])   ? Syncope and collapse   ? Urinary tract infection   ? ?Past Surgical History:  ?Past Surgical History:  ?Procedure Laterality Date  ? AORTIC VALVE REPLACEMENT N/A 09/16/2015  ? Procedure: AORTIC VALVE REPLACEMENT (AVR);  Surgeon: Rexene Alberts, MD;  Location: [facility];  Service: Open Heart Surgery;  Laterality: N/A;  ? BREAST BIOPSY Right 1999  ? breast ca radation  ? BREAST EXCISIONAL BIOPSY Left yrs ago   ? benign  ? BREAST LUMPECTOMY Right 1999  ? with radiation  ? CARDIAC CATHETERIZATION N/A 08/17/2015  ? Procedure: Left Heart Cath and Coronary Angiography;  Surgeon: Minna Merritts, MD;  Location: [facility];  Service: Cardiovascular;  Laterality: N/A;  ? CATARACT EXTRACTION, BILATERAL    ? CHOLECYSTECTOMY    ? CRANIOTOMY Right 12/18/2021  ? Procedure: CRANIOTOMY HEMATOMA EVACUATION SUBDURAL;  Surgeon: Meade Maw, MD;  Location: [facility];  Service:  Neurosurgery;  Laterality: Right;  ? JOINT REPLACEMENT Bilateral   ? knee replacement  ? KNEE ARTHROSCOPY    ? right  ? KNEE ARTHROSCOPY    ? left  ? SKIN CANCER EXCISION  2020  ? TEE WITHOUT CARDIOVERSION N/A 09/16/2015  ? Procedure: TRANSESOPHAGEAL ECHOCARDIOGRAM (TEE);  Surgeon: Rexene Alberts, MD;  Location: [facility];  Service: Open Heart Surgery;  Laterality: N/A;  ? TONSILLECTOMY    ? VAGINAL HYSTERECTOMY    ? ?Social History:  reports that she has never smoked. She has never used smokeless tobacco. She reports that she does not currently use alcohol. She reports that she does not use drugs. ? ?Family / Support Systems ?Marital Status: Married ?How Long?: 12.5 years (second marriage) ?Patient Roles: Spouse, Parent ?Spouse/Significant Other: Joneen Boers (husband); not able to provide any physical assistance due to physical mobility challenges and cognition. ?Children: 3 adult children- Kelli (dtr; lives in [place]), Mancelona (dtr; special needs and lives in group home in [place]), and Octavia Bruckner (son; lives in [place]). ?Other Supports: None reported ?Anticipated Caregiver: Pt husband ?Ability/Limitations of Caregiver: Pt husband is not able to provide any physical assistance and has some cog defecits. Pt will need to be independent at time of discharge. ?Caregiver Availability: 24/7 ?Family Dynamics: Pt lives with her husband Joneen Boers. ? ?Social History ?Preferred language: English ?Religion: Christian ?Cultural Background: Pt worked at [employer] for 27 years until retirement in 2018.  Pt has been married twice. Children are with first husband in which she was married for 42 yrs and ended in divorce. ?Education: high school grad ?Health Literacy - How often do you need to have someone help you when you read instructions, pamphlets, or other written material from your doctor or pharmacy?: Never ?Writes: Yes ?Employment Status: Retired ?Date Retired/Disabled/Unemployed: 2018 ?Age Retired: 25 ?Legal History/Current Legal Issues:  Denies ?Guardian/Conservator: N/A  ? ?Abuse/Neglect ?Abuse/Neglect Assessment Can Be Completed: Yes ?Physical Abuse: Denies ?Verbal Abuse: Denies ?Sexual Abuse: Denies ?Exploitation of patient/patient's resources: Denies ?Self-Neglect: Denies ? ?Patient response to: ?Social Isolation - How often do you feel lonely or isolated from those around you?: Never ? ?Emotional Status ?Pt's affect, behavior and adjustment status: Pt in good spirits at time of visit. Pt was able to answer the phone while in room and engage in 5-7 minute conversation with her dtr [name]. ?Recent Psychosocial Issues: Denies currently ?Psychiatric History: reports depression in the past ?Substance Abuse History: Denies ? ?Patient / Family Perceptions, Expectations & Goals ?Pt/Family understanding of illness & functional limitations: Pt and family have a general understanding of pt care needs. ?Premorbid pt/family roles/activities: Independent ?Anticipated changes in roles/activities/participation: Assistance with ADLs/IADLs ?Pt/family expectations/goals: Pt goal is to get back to walking. ? ?[employer] ?Community Agencies: None ?Premorbid Home Care/DME Agencies: None ?Transportation available at discharge: TBD ?Is the patient able to respond to transportation needs?: Yes ?In the past 12 months, has lack of transportation kept you from medical appointments or from getting medications?: No ?In the past 12 months, has lack of transportation kept you from meetings, work, or from getting things needed for daily living?: No ?Resource referrals recommended: Neuropsychology ? ?Discharge Planning ?Living Arrangements: Spouse/significant other ?Support Systems: Spouse/significant other, Children, Other relatives ?Type of Residence: Private residence ?Insurance Resources: Multimedia programmer (specify) ([employer]) ?Financial Resources: Social Security ?Financial Screen Referred: No ?Living Expenses: Own ?Money Management: Other (Comment)  (dtr [employer] manages finances) ?Does the patient have any problems obtaining your medications?: No ?Home Management: Pt and husband manage home care needs. ?Patient/Family Preliminary Plans: TBD ?Care Coordinator Barriers to Discharge: Decreased caregiver support, Lack of/limited family support ?Care Coordinator Anticipated Follow Up Needs: HH/OP ?Expected length of stay: 10-14 days ? ?Clinical Impression ?SW met with pt in room to introduce self, explain role, and discuss discharge needs. Pt is not a English as a second language teacher. No formal HCPOA. Designates her dtr [facility] as POA. DME: 3in1 BSC, cane, rolling walker (old).  ? ?Rana Snare ?12/27/2021, 12:01 PM ? ?  ?

## 2021-12-27 NOTE — Plan of Care (Signed)
Behavioral Plan  ? ?[place] Level: VII ? ?Behavior to decrease/ eliminate:  ?-time in bed ?-mild impulsivity ?-aspiration risk ?-reduced recall ? ?Changes to environment:  ?-lights on, blinds up during day ?-lights off, TV off night ?-No TV and OOB for meals ? ?Interventions: ?-offer sitting OOB ?-memory notebook ?-must drink from Provale cup ?-sleep chart ? ?Recommendations for interactions with patient: ?-Pt is HOH, speak up in lower tones to improve communications ? ?Attendees:  ?Weston Anna, SLP, Apolinar Junes, PT, Mariane Masters, OT, Butch Penny, LPN ?

## 2021-12-27 NOTE — Progress Notes (Signed)
Occupational Therapy TBI Note ? ?Patient Details  ?Name: Kayla Gray ?MRN: [id number] ?Date of Birth: November 07, 1935 ? ?Today's Date: 12/27/2021 ?OT Individual Time: 4462-8638 ?OT Individual Time Calculation (min): 59 min  ? ? ?Short Term Goals: ?Week 1:  OT Short Term Goal 1 (Week 1): Pt will complete ambualtory toilet transfers iwht CGA ?OT Short Term Goal 2 (Week 1): Pt will complete TTB transfer with CGA and min cuing for sequencing ?OT Short Term Goal 3 (Week 1): Pt will complete ADL with no grip slips out of LUE to demo improved attention, grasp and coordination ?OT Short Term Goal 4 (Week 1): Pt will groom in standing wiht CGA ? ?Skilled Therapeutic Interventions/Progress Updates:  ?  Pt received sitting up in bed eating breakfast- RN supervising and reporting pt was difficult to arouse this morning but now is bright and actively participating in conversation. She consumed breakfast with (S), no overt s/s of aspiration. She completed bed mobility to the EOB with (S). Functional mobility to the bathroom with CGA using the RW. She completed toileting tasks with CGA overall. MD rounded and approved head to get wet. Pt completed shower transfer with CGA. She completed bathing with sit <> stand with CGA overall. She required min cueing for safety/sequencing. She returned to EOB with CGA. UB dressing with (S), LB with mod A. She required mod A to don shoes. Pt was left supine with all needs met, passed off to transport for MBS.  ? ?Therapy Documentation ?Precautions:  ?Precautions ?Precautions: Fall ?Restrictions ?Weight Bearing Restrictions: No ? ?  ?Agitated Behavior Scale: ?TBI ?Observation Details ?Observation Environment: pt room ?Start of observation period - Date: 12/27/21 ?Start of observation period - Time: 0730 ?End of observation period - Date: 12/27/21 ?End of observation period - Time: 0830 ?Agitated Behavior Scale (DO NOT LEAVE BLANKS) ?Short attention span, easy distractibility, inability to  concentrate: Absent ?Impulsive, impatient, low tolerance for pain or frustration: Absent ?Uncooperative, resistant to care, demanding: Absent ?Violent and/or threatening violence toward people or property: Absent ?Explosive and/or unpredictable anger: Absent ?Rocking, rubbing, moaning, or other self-stimulating behavior: Absent ?Pulling at tubes, restraints, etc.: Absent ?Wandering from treatment areas: Absent ?Restlessness, pacing, excessive movement: Absent ?Repetitive behaviors, motor, and/or verbal: Absent ?Rapid, loud, or excessive talking: Absent ?Sudden changes of mood: Absent ?Easily initiated or excessive crying and/or laughter: Absent ?Self-abusiveness, physical and/or verbal: Absent ?Agitated behavior scale total score: 14 ? ? ?Therapy/Group: Individual Therapy ? ?Curtis Sites ?12/27/2021, 8:49 AM ?

## 2021-12-28 LAB — GLUCOSE, CAPILLARY
Glucose-Capillary: 97 mg/dL (ref 70–99)
Glucose-Capillary: 113 mg/dL — ABNORMAL HIGH (ref 70–99)

## 2021-12-28 NOTE — Progress Notes (Signed)
Speech Language Pathology TBI Note ? ?Patient Details  ?Name: Kayla Gray ?MRN: [id number] ?Date of Birth: 05-27-1936 ? ?Today's Date: 12/28/2021 ?SLP Individual Time: 1000-1045 ?SLP Individual Time Calculation (min): 45 min ? ?Short Term Goals: ?Week 1: SLP Short Term Goal 1 (Week 1): Pt will complete MBS to objectively assess oropharyngeal functioning and determine least restrictive diet with 100% completion. ?SLP Short Term Goal 1 - Progress (Week 1): Met ?SLP Short Term Goal 2 (Week 1): Pt will consume recommended diet textures and liquids with no clinical s/sx concerning for aspiration and supervision for implementation of aspiration precautions. ?SLP Short Term Goal 3 (Week 1): Pt will utilize compensatory memory strategies to recall functional information with 80% accuracy given Min verbal cues. ?SLP Short Term Goal 4 (Week 1): Pt will complete functional problem-solving tasks with 80% accuracy given Min verbal cues. ?SLP Short Term Goal 5 (Week 1): Pt will complete generative naming tasks for personally-relevant information with 80% given Min verbal cues. ?SLP Short Term Goal 6 (Week 1): Pt will utilize oral hold during consumption of thin liquids via cup to aid in coordination and bolus cohesion to mitigate aspiration risk with supverision assistance. ? ?Skilled Therapeutic Interventions: Skilled treatment session focused on cognitive goals. SLP facilitated session by providing extra time and overall Min A verbal cues for functional problem solving during a basic money management task. Patient required increased cueing during a basic medication management task regarding locating pertinent information on a medication label as well as interpreting directions for medication administration. Patient completed basic reading comprehension task with 50% accuracy with decreased attention to left visual field noted. Patient requested to return back to bed at end of session but agreeable to resting in the recliner.  Patient left semi-reclined with alarm on and all needs within reach. Continue with current plan of care.  ?   ? ?Pain ?Pain Assessment ?Pain Scale: 0-10 ?Faces Pain Scale: No hurt ? ?Agitated Behavior Scale: ?TBI ?Observation Details ?Observation Environment: CIR ?Start of observation period - Date: 12/28/21 ?Start of observation period - Time: 1000 ?End of observation period - Date: 12/28/21 ?End of observation period - Time: 1045 ?Agitated Behavior Scale (DO NOT LEAVE BLANKS) ?Short attention span, easy distractibility, inability to concentrate: Present to a slight degree ?Impulsive, impatient, low tolerance for pain or frustration: Absent ?Uncooperative, resistant to care, demanding: Absent ?Violent and/or threatening violence toward people or property: Absent ?Explosive and/or unpredictable anger: Absent ?Rocking, rubbing, moaning, or other self-stimulating behavior: Absent ?Pulling at tubes, restraints, etc.: Absent ?Wandering from treatment areas: Absent ?Restlessness, pacing, excessive movement: Absent ?Repetitive behaviors, motor, and/or verbal: Absent ?Rapid, loud, or excessive talking: Absent ?Sudden changes of mood: Absent ?Easily initiated or excessive crying and/or laughter: Absent ?Self-abusiveness, physical and/or verbal: Absent ?Agitated behavior scale total score: 15 ? ?Therapy/Group: Individual Therapy ? ?Shantoya Geurts ?12/28/2021, 3:19 PM ?

## 2021-12-28 NOTE — Progress Notes (Signed)
?                                                       PROGRESS NOTE ? ? ?Subjective/Complaints: ?Reports some drooling, not severe. Worst after eating. No choking, coughing, sob.  ? ?ROS: Patient denies fever, rash, sore throat, blurred vision, dizziness, nausea, vomiting, diarrhea, cough, shortness of breath or chest pain, joint or back/neck pain, headache, or mood change.  ? ? ?Objective: ?  ?DG Swallowing Func-Speech Pathology ? ?Result Date: 12/27/2021 ?Table formatting from the original result was not included. Objective Swallowing Evaluation: Type of Study: MBS-Modified Barium Swallow Study  Patient Details Name: Kayla Gray MRN: [id number] Date of Birth: 1936-07-07 Today's Date: 12/27/2021 Past Medical History: Past Medical History: Diagnosis Date  Aortic stenosis   Bilateral cataracts   Bleeding nose   Thurs night (09/08/15) and Fri (09/09/15) left side.Bleeding didn't last long  Breast cancer ([place]) 1999  right breast lumpectomy and rad tx  Chronic diastolic (congestive) heart failure (HCC)   Depression   Diverticulosis   Gastroesophageal reflux disease   Heart murmur   Hyperlipidemia   Hypertension   Iron deficiency anemia   Obstructive sleep apnea   Osteoarthritis   Osteopenia   Peptic ulcer disease   Personal history of radiation therapy   S/P aortic valve replacement with bioprosthetic valve 09/16/2015  23 mm Edwards Intuity Elite bioprosthetic tissue valve  Skin cancer 2020  Stroke ([place])   11/14/13  Supraventricular tachycardia (HCC)   Syncope and collapse   Urinary tract infection  Past Surgical History: Past Surgical History: Procedure Laterality Date  AORTIC VALVE REPLACEMENT N/A 09/16/2015  Procedure: AORTIC VALVE REPLACEMENT (AVR);  Surgeon: Rexene Alberts, MD;  Location: [facility];  Service: Open Heart Surgery;  Laterality: N/A;  BREAST BIOPSY Right 1999  breast ca radation  BREAST EXCISIONAL BIOPSY Left yrs ago   benign  BREAST LUMPECTOMY Right 1999  with radiation  CARDIAC CATHETERIZATION N/A  08/17/2015  Procedure: Left Heart Cath and Coronary Angiography;  Surgeon: Minna Merritts, MD;  Location: [facility];  Service: Cardiovascular;  Laterality: N/A;  CATARACT EXTRACTION, BILATERAL    CHOLECYSTECTOMY    CRANIOTOMY Right 12/18/2021  Procedure: CRANIOTOMY HEMATOMA EVACUATION SUBDURAL;  Surgeon: Meade Maw, MD;  Location: [facility];  Service: Neurosurgery;  Laterality: Right;  JOINT REPLACEMENT Bilateral   knee replacement  KNEE ARTHROSCOPY    right  KNEE ARTHROSCOPY    left  SKIN CANCER EXCISION  2020  TEE WITHOUT CARDIOVERSION N/A 09/16/2015  Procedure: TRANSESOPHAGEAL ECHOCARDIOGRAM (TEE);  Surgeon: Rexene Alberts, MD;  Location: [facility];  Service: Open Heart Surgery;  Laterality: N/A;  TONSILLECTOMY    VAGINAL HYSTERECTOMY   HPI: See H and P  Subjective: pt pleasant, husband present, both eating lunch  Recommendations for follow up therapy are one component of a multi-disciplinary discharge planning process, led by the attending physician.  Recommendations may be updated based on patient status, additional functional criteria and insurance authorization. Assessment / Plan / Recommendation Clinical Impressions 12/27/2021 Clinical Impression Per instrumental swallow study (MBSS), pt presents with mild oropharyngeal dysphagia. Oral phase c/b lingual pumping, bolus holding due to poor coordination + timing, decreased bolus cohesion, and delayed A-P transit. Pharyngeal phase c/b mildly decreased BOT retraction resulting in mild vallecular residuals post-swallow. One instances of  sensate penetration with subsequent aspiration when consuming large sip of thin liquid via cup. Thin liquid via straw was not assessed due to overt, immediate aspiration sx's during BSE on 12/26/2021. To improve oral timing and pharyngeal protection, SLP provided verbal cues for pt to perform oral hold for 1, 2, 3 and then swallow. Above mentioned strategy was objectively observed to improve pt's overall oral  coordination, timeliness of A-P transit, and decreased premature spillage of liquids.   Giving instrumental findings, recommend initiation of Dysphagia 3 with thin liquids via Provale cup (NO STRAWS) with adherence to aspiration precautions of slow rate, alert and upright, small + single bites/sips, use of Provale cup, and limiting distractions. May prompt oral hold with verbal cues "1, 2, 3, swallow." Pt provided with verbal recommendations and verbalized understanding, though will need reinforcement for adherence to precautions. RN notified and pt verbalized understanding.  SLP Visit Diagnosis Dysphagia, oropharyngeal phase (R13.12) Attention and concentration deficit following -- Frontal lobe and executive function deficit following -- Impact on safety and function Mild aspiration risk   No flowsheet data found.  Prognosis 12/27/2021 Prognosis for Safe Diet Advancement Good Barriers to Reach Goals Cognitive deficits Barriers/Prognosis Comment -- Diet Recommendations 12/27/2021 SLP Diet Recommendations Dysphagia 3 (Mech soft) solids;Thin liquid Liquid Administration via No straw;Cup Medication Administration Crushed with puree Compensations Minimize environmental distractions;Slow rate;Small sips/bites Postural Changes Seated upright at 90 degrees   Other Recommendations 12/27/2021 Recommended Consults -- Oral Care Recommendations Oral care BID Other Recommendations -- Follow Up Recommendations -- Assistance recommended at discharge -- Functional Status Assessment -- No flowsheet data found.  Oral Phase 12/27/2021 Oral Phase Impaired Oral - Pudding Teaspoon -- Oral - Pudding Cup -- Oral - Honey Teaspoon -- Oral - Honey Cup -- Oral - Nectar Teaspoon -- Oral - Nectar Cup Holding of bolus;Delayed oral transit;Decreased bolus cohesion;Lingual pumping;Premature spillage Oral - Nectar Straw Holding of bolus;Delayed oral transit;Decreased bolus cohesion;Lingual pumping;Premature spillage Oral - Thin Teaspoon Delayed oral  transit;Holding of bolus;Lingual pumping;Premature spillage;Decreased bolus cohesion Oral - Thin Cup Holding of bolus;Lingual pumping;Delayed oral transit;Decreased bolus cohesion;Premature spillage Oral - Thin Straw -- Oral - Puree Delayed oral transit;Weak lingual manipulation;Reduced posterior propulsion Oral - Mech Soft Delayed oral transit;Decreased bolus cohesion;Weak lingual manipulation Oral - Regular -- Oral - Multi-Consistency -- Oral - Pill -- Oral Phase - Comment Oral hold was effective in maximizing oral control and decreasing lingual pumping  Pharyngeal Phase 12/27/2021 Pharyngeal Phase Impaired Pharyngeal- Pudding Teaspoon -- Pharyngeal -- Pharyngeal- Pudding Cup -- Pharyngeal -- Pharyngeal- Honey Teaspoon -- Pharyngeal -- Pharyngeal- Honey Cup -- Pharyngeal -- Pharyngeal- Nectar Teaspoon -- Pharyngeal -- Pharyngeal- Nectar Cup Delayed swallow initiation-vallecula;Reduced tongue base retraction;Pharyngeal residue - valleculae Pharyngeal Material does not enter airway Pharyngeal- Nectar Straw Delayed swallow initiation-vallecula;Reduced tongue base retraction;Pharyngeal residue - valleculae Pharyngeal Material does not enter airway Pharyngeal- Thin Teaspoon Delayed swallow initiation-vallecula;Reduced tongue base retraction;Pharyngeal residue - valleculae Pharyngeal Material does not enter airway Pharyngeal- Thin Cup Delayed swallow initiation-vallecula;Reduced tongue base retraction;Pharyngeal residue - valleculae;Penetration/Aspiration during swallow;Trace aspiration Pharyngeal Material enters airway, passes BELOW cords and not ejected out despite cough attempt by patient Pharyngeal- Thin Straw -- Pharyngeal -- Pharyngeal- Puree Pharyngeal residue - valleculae Pharyngeal Material does not enter airway Pharyngeal- Mechanical Soft Pharyngeal residue - valleculae Pharyngeal Material does not enter airway Pharyngeal- Regular -- Pharyngeal -- Pharyngeal- Multi-consistency -- Pharyngeal -- Pharyngeal- Pill  -- Pharyngeal -- Pharyngeal Comment Suspect aspiration due to distraction of verbal cues during test  Cervical Esophageal Phase  12/27/2021  Cervical Esophageal Phase WFL Pudding Teaspoon -- Pudding Cup -- Honey Teaspoon

## 2021-12-28 NOTE — Progress Notes (Signed)
Recreational Therapy Session Note ? ?Patient Details  ?Name: Kayla Gray ?MRN: [id number] ?Date of Birth: Apr 09, 1936 ?Today's Date: 12/28/2021 ? ?Pain: no c/o ?Skilled Therapeutic Interventions/Progress Updates: pt asleep upon arrival, awakened to name but closing eyes after acknowledging LRT.  Pt states she was tired and trying to rest.  TR eval incomplete at this time.   ? ?Sakshi Sermons ?12/28/2021, 4:18 PM  ?

## 2021-12-28 NOTE — Progress Notes (Signed)
Occupational Therapy TBI Note ? ?Patient Details  ?Name: Kayla Gray ?MRN: [id number] ?Date of Birth: 28-Nov-1935 ? ?Today's Date: 12/28/2021 ?OT Individual Time: 2035-5974 ?OT Individual Time Calculation (min): 53 min  ? ? ?Short Term Goals: ?Week 1:  OT Short Term Goal 1 (Week 1): Pt will complete ambualtory toilet transfers iwht CGA ?OT Short Term Goal 2 (Week 1): Pt will complete TTB transfer with CGA and min cuing for sequencing ?OT Short Term Goal 3 (Week 1): Pt will complete ADL with no grip slips out of LUE to demo improved attention, grasp and coordination ?OT Short Term Goal 4 (Week 1): Pt will groom in standing wiht CGA ? ?Skilled Therapeutic Interventions/Progress Updates:  ?Pt received asleep in supine easily able to arouse  agreeable to OT intervention. Session focus on functional mobility, dynamic standing balance, LUE coordination and decreasing overall caregiver burden. Pt completed stand pivot from EOB to w/c with hand held [facility]. Pt transported to gym with total A for time mgmt.   ?Pt completed Standing therapetuic activity of standing on compliant surface while placing weighted clothes pin on bar with LUE to facilitate improved dynamic standing balance.  Pt needed MINA to step onto surface and CGA to complete task. No difficulty noted when manipulating clothes pin with LUE. Pt able to complete second task standing on compliant surface while engaging in dynamic balance task of playing cards with an emphasis on standing with no UE support. Pt completed task with CGA. No issues sequencing during task but did note mild impairments with attention as pt noted to become distracted by other pts in gym. Pt completed task with overall CGA.  ?Pt also able to ambulate up and down ramp in gym with hand held [facility] to challenge dynamic standing balance. Pt transported back to room with total A where pt completed ambulatory toilet transfer into bathroom with Rw and CGA. Pt completed 3/3 toileting tasks with  supervision. Pt ambulated to recliner with Rw and CGA. pt left seated in recliner with alarm belt activated and all needs within reach.                     ? ? ?Therapy Documentation ?Precautions:  ?Precautions ?Precautions: Fall ?Restrictions ?Weight Bearing Restrictions: No ? ?Pain: no pain reported during session  ? ?Agitated Behavior Scale: ?TBI ?Observation Details ?Observation Environment: CIR ?Start of observation period - Date: 12/28/21 ?Start of observation period - Time: 42 ?End of observation period - Date: 12/28/21 ?End of observation period - Time: 1458 ?Agitated Behavior Scale (DO NOT LEAVE BLANKS) ?Short attention span, easy distractibility, inability to concentrate: Present to a slight degree ?Impulsive, impatient, low tolerance for pain or frustration: Absent ?Uncooperative, resistant to care, demanding: Absent ?Violent and/or threatening violence toward people or property: Absent ?Explosive and/or unpredictable anger: Absent ?Rocking, rubbing, moaning, or other self-stimulating behavior: Absent ?Pulling at tubes, restraints, etc.: Absent ?Wandering from treatment areas: Absent ?Restlessness, pacing, excessive movement: Absent ?Repetitive behaviors, motor, and/or verbal: Absent ?Rapid, loud, or excessive talking: Absent ?Sudden changes of mood: Absent ?Easily initiated or excessive crying and/or laughter: Absent ?Self-abusiveness, physical and/or verbal: Absent ?Agitated behavior scale total score: 15 ? ? ? ? ?Therapy/Group: Individual Therapy ? ?Precious Haws ?12/28/2021, 3:01 PM ?

## 2021-12-28 NOTE — Progress Notes (Signed)
Physical Therapy Session Note ? ?Patient Details  ?Name: Kayla Gray ?MRN: [id number] ?Date of Birth: 01/20/1936 ? ?Today's Date: 12/28/2021 ?PT Individual Time: 0800-0900 ?PT Individual Time Calculation (min): 60 min  ? ?Short Term Goals: ?Week 1:  PT Short Term Goal 1 (Week 1): Patient will perform basic transfer with CGA consistently. ?PT Short Term Goal 2 (Week 1): Patient will ambulate >100 feet using LRAD with CGA. ?PT Short Term Goal 3 (Week 1): Patient will perform 6 steps using R rail with min A. ? ?Skilled Therapeutic Interventions/Progress Updates:  ?   ?Patient in sitting EOB eating breakfast with NT providing direct supervision upon PT arrival. Patient alert and agreeable to PT session. Patient denied pain during session, reports her headache has resolved this morning. ?Patient noted to be drinking coffee from a regular mug. Educated patient and NT on placing all drinks in her Pravale cup due to aspiration risk. Cleaned pravale cup and placed remaining coffee in it for patient to drink. Placed sing outside patient's door to cue staff. ? ?Therapeutic Activity: ?Transfers: Patient performed sit to/from stand from the bed, TIS w/c, and toilet with supervision using a RW. Provided verbal cues for forward weight shift and keeping weight on her toes to reduce retropulsion when coming to standing. ?Patient performed an ambulatory transfer to/from the bathroom with close supervision. Patient was continent of bowl and bladder during toileting and performed peri-care independently. Required mod A for lower body dressing to change incontinence brief and thread legs through her pants. Donned knee high TEDs with total A and provided patient with her "bedroom shoes" which she donned with set-up assist. She performed hand hygiene and oral care independently at the sink in standing with close supervision without use of upper extremities for balance, stood >2 min. ?Patient performed laundry task with gait training,  picked up a w/c cover from the floor holding onto her RW with supervision. She removed the wet cushion covers to the laundry basket from the washer, placed her w/c cover in the washer, placed in detergent, and started the machine with min cues for washer use due to being unfamiliar with this washer. Performed dynamic standing balance with and without upper extremity support with close supervision.  ? ?Gait Training:  ?Patient ambulated >50 feet x2 with a standing rest break in between to perform laundry task, see above, using RW with close supervision. Ambulated with decreased gait speed, decreased step length and height L>R with intermittent L foot drag, forward trunk lean, and downward head gaze.Marland Kitchen Provided verbal cues for erect posture, safe proximity to RW, increased L step height, and increased gait speed. ? ?Patient in [place] w/c in the room at end of session with breaks locked, seat belt alarm set, and all needs within reach.  ? ?Therapy Documentation ?Precautions:  ?Precautions ?Precautions: Fall ?Restrictions ?Weight Bearing Restrictions: No ? ? ? ?Therapy/Group: Individual Therapy ? ?Doreene Burke PT, DPT ? ?12/28/2021, 12:10 PM  ?

## 2021-12-28 NOTE — Progress Notes (Signed)
Occupational Therapy TBI Note ? ?Patient Details  ?Name: Kayla Gray ?MRN: [id number] ?Date of Birth: November 12, 1935 ? ?Today's Date: 12/28/2021 ?OT Individual Time: 3300-7622 ?OT Individual Time Calculation (min): 45 min  ? ? ?Short Term Goals: ?Week 1:  OT Short Term Goal 1 (Week 1): Pt will complete ambualtory toilet transfers iwht CGA ?OT Short Term Goal 2 (Week 1): Pt will complete TTB transfer with CGA and min cuing for sequencing ?OT Short Term Goal 3 (Week 1): Pt will complete ADL with no grip slips out of LUE to demo improved attention, grasp and coordination ?OT Short Term Goal 4 (Week 1): Pt will groom in standing wiht CGA ? ?Skilled Therapeutic Interventions/Progress Updates:  ?   ?Pt received in recliner with no pain.  ?Therapeutic activity ?Pt unable to recall both session of therapy prior to OT arrival but able to recall doing laundry. Pt amb with RW to washing machine with supervision. Pt returns to room and places cushion cover to dry. Pt amb back to dayroom with CGA HHA and no AD with cuing for LLE step length and foot clearance. Pt goven pictures of food items and categories across the dayroom to sort pictures in to. Increased A up to MIN A during tight turning and pt requiring cuing 50% of time to recall locations of the correct categories.  ? ?Pt left at end of session in recliner with exit alarm on, call light in reach and all needs met ? ? ?Therapy Documentation ?Precautions:  ?Precautions ?Precautions: Fall ?Restrictions ?Weight Bearing Restrictions: No ?General: ?  ?Agitated Behavior Scale: ?TBI  ?Observation Details ?Observation Environment: pt room ?Start of observation period - Date: 12/28/21 ?Start of observation period - Time: 1115 ?End of observation period - Date: 12/28/21 ?End of observation period - Time: 1200 ?Agitated Behavior Scale (DO NOT LEAVE BLANKS) ?Short attention span, easy distractibility, inability to concentrate: Present to a slight degree ?Impulsive, impatient, low  tolerance for pain or frustration: Absent ?Uncooperative, resistant to care, demanding: Absent ?Violent and/or threatening violence toward people or property: Absent ?Explosive and/or unpredictable anger: Absent ?Rocking, rubbing, moaning, or other self-stimulating behavior: Absent ?Pulling at tubes, restraints, etc.: Absent ?Wandering from treatment areas: Absent ?Restlessness, pacing, excessive movement: Absent ?Repetitive behaviors, motor, and/or verbal: Absent ?Rapid, loud, or excessive talking: Absent ?Sudden changes of mood: Absent ?Easily initiated or excessive crying and/or laughter: Absent ?Self-abusiveness, physical and/or verbal: Absent ?Agitated behavior scale total score: 15 ? ? ? ?Therapy/Group: Individual Therapy ? ?[name] ?12/28/2021, 6:50 AM ?

## 2021-12-29 LAB — GLUCOSE, CAPILLARY: Glucose-Capillary: 121 mg/dL — ABNORMAL HIGH (ref 70–99)

## 2021-12-29 NOTE — Progress Notes (Signed)
?                                                       PROGRESS NOTE ? ? ?Subjective/Complaints: ?No new problems this morning. Tolerating therapies well. Up in bathroom when I came in. Still some drooling ? ?ROS: Patient denies fever, rash, sore throat, blurred vision, dizziness, nausea, vomiting, diarrhea, cough, shortness of breath or chest pain, joint or back/neck pain, headache, or mood change.  ? ? ?Objective: ?  ?No results found. ?No results for input(s): WBC, HGB, HCT, PLT in the last 72 hours. ? ?No results for input(s): NA, K, CL, CO2, GLUCOSE, BUN, CREATININE, CALCIUM in the last 72 hours. ? ? ?Intake/Output Summary (Last 24 hours) at 12/29/2021 1223 ?Last data filed at 12/28/2021 1900 ?Gross per 24 hour  ?Intake 240 ml  ?Output --  ?Net 240 ml  ?  ? ?  ? ?Physical Exam: ?Vital Signs ?Blood pressure (!) 143/80, pulse 68, temperature 98.2 ?F (36.8 ?C), temperature source Oral, resp. rate 14, height '5\' 2"'$  (1.575 m), weight 70.5 kg, SpO2 100 %. ? ?Constitutional: No distress . Vital signs reviewed. ?HEENT: NCAT, EOMI, oral membranes moist ?Neck: supple ?Cardiovascular: RRR without murmur. No JVD    ?Respiratory/Chest: CTA Bilaterally without wheezes or rales. Normal effort    ?GI/Abdomen: BS +, non-tender, non-distended ?Ext: no clubbing, cyanosis, or edema ?Psych: pleasant and cooperative  ?Skin: Clean and intact without signs of breakdown, crani incision cdi ?Neuro:  Alert and oriented x 3. Normal insight and awareness. Intact Memory. Normal language and speech. Cranial nerve exam unremarkable. Mild left sided weakness. RUE and RLE 5/5. Sensory exam normal for light touch and pain in all 4 limbs. No limb ataxia or cerebellar signs. No abnormal tone appreciated.   ?Musculoskeletal: old TKA incisions, good knee ROM ? ? ?Assessment/Plan: ?1. Functional deficits which require 3+ hours per day of interdisciplinary therapy in a comprehensive inpatient rehab setting. ?Physiatrist is providing close team  supervision and 24 hour management of active medical problems listed below. ?Physiatrist and rehab team continue to assess barriers to discharge/monitor patient progress toward functional and medical goals ? ?Care Tool: ? ?Bathing ?   ?Body parts bathed by patient: Right arm, Left arm, Chest, Abdomen, Front perineal area, Buttocks, Right upper leg, Left upper leg, Face  ? Body parts bathed by helper: Right lower leg, Left lower leg ?  ?  ?Bathing assist Assist Level: Minimal Assistance - Patient > 75% ?  ?  ?Upper Body Dressing/Undressing ?Upper body dressing   ?What is the patient wearing?: Pull over shirt ?   ?Upper body assist Assist Level: Moderate Assistance - Patient 50 - 74% ?   ?Lower Body Dressing/Undressing ?Lower body dressing ? ? ?   ?What is the patient wearing?: Underwear/pull up ? ?  ? ?Lower body assist Assist for lower body dressing: Minimal Assistance - Patient > 75% ?   ? ?Toileting ?Toileting    ?Toileting assist Assist for toileting: Supervision/Verbal cueing ?Assistive Device Comment: walker ?  ?Transfers ?Chair/bed transfer ? ?Transfers assist ?   ? ?Chair/bed transfer assist level: Contact Guard/Touching assist ?  ?  ?Locomotion ?Ambulation ? ? ?Ambulation assist ? ?   ? ?Assist level: Contact Guard/Touching assist ?Assistive device: Walker-rolling ?Max distance: 20  ? ?Walk 10 feet activity ? ? ?  Assist ?   ? ?Assist level: Contact Guard/Touching assist ?Assistive device: Walker-rolling  ? ?Walk 50 feet activity ? ? ?Assist   ? ?Assist level: Minimal Assistance - Patient > 75% ?Assistive device: Hand held assist  ? ? ?Walk 150 feet activity ? ? ?Assist Walk 150 feet activity did not occur: Safety/medical concerns ? ?  ?  ?  ? ?Walk 10 feet on uneven surface  ?activity ? ? ?Assist Walk 10 feet on uneven surfaces activity did not occur: Safety/medical concerns ? ? ?  ?   ? ?Wheelchair ? ? ? ? ?Assist Is the patient using a wheelchair?: Yes ?Type of Wheelchair: Manual ?  ? ?Wheelchair assist  level: Total Assistance - Patient < 25% ?Max wheelchair distance: 150 ft  ? ? ?Wheelchair 50 feet with 2 turns activity ? ? ? ?Assist ? ?  ?  ? ? ?Assist Level: Total Assistance - Patient < 25%  ? ?Wheelchair 150 feet activity  ? ? ? ?Assist ?   ? ? ?Assist Level: Total Assistance - Patient < 25%  ? ?Blood pressure (!) 143/80, pulse 68, temperature 98.2 ?F (36.8 ?C), temperature source Oral, resp. rate 14, height '5\' 2"'$  (1.575 m), weight 70.5 kg, SpO2 100 %. ? ?Medical Problem List and Plan: ?1. Functional deficits secondary to right SDH after fall. Pt s/p craniotomy ?            -patient may  shower ?            -ELOS/Goals: 12-14 days, supervision ? -Continue CIR therapies including PT, OT, and SLP  ?2.  Antithrombotics: ?-DVT/anticoagulation:  Pharmaceutical: Lovenox ?            -antiplatelet therapy: N/A due to bleed.  ?3. Pain Management: Tylenol prn.  ?4. Mood: LCSW to follow for evaluation and support.  ?            -antipsychotic agents: N/A ?5. Neuropsych: This patient is capable of making decisions on her own behalf. ?6. Skin/Wound Care: Routine pressure relief measures.  ?7. Fluids/Electrolytes/Nutrition: encourage PO ? -added protein ?8. HTN: Monitor BP TID--continue metoprolol and Losartan.  ?9. Chronic diastolic CHF: Low salt diet.  ?  ?Filed Weights  ? 12/27/21 0458 12/28/21 0447 12/29/21 0500  ?Weight: 75.6 kg 73.2 kg 70.5 kg  ?  ? 3/17 weights dropping, question accuracy ?10. Pre-diabetes:  Hgb A1c-5.7. Added low carb restrictions.  ?-  well controlled. Dc cbg's ?--RD   for diet education.  ?11. H/o SVT/ PAF: Monitor HR TID--continue metoprolol. Zio patch to be mailed to pt ?            -apparently had a short run of VT prior transfer this morning ? -no issues, sx so far ?12. Seizure prophylaxis: Continue Keppra BID.  ?13. Hyperactive bladder: Now on ditropan.  ?            -pt reports baseline urinary frequency ?14. Post-op anemia ? -hgb 9.9 ? -add fe supp ? -recheck cbc Monday ?15.  Dysphagia: ? --MBS notable for oropharyngeal dysphagia which correlates with her complaints of drooling more after eating. Likely exacerbation of her chronic problem ? -pt cleared for D3/thins with provale cup to help limit fluid bolus ? -doing well with diet/precautions ?  ? ?LOS: ?4 days ?A FACE TO FACE EVALUATION WAS PERFORMED ? ?Meredith Staggers ?12/29/2021, 12:23 PM  ? ?  ?

## 2021-12-29 NOTE — Progress Notes (Signed)
Physical Therapy Session Note ? ?Patient Details  ?Name: Kayla Gray ?MRN: [id number] ?Date of Birth: 03-Oct-1936 ? ?Today's Date: 12/29/2021 ?PT Individual Time: 1030-1130 ?PT Individual Time Calculation (min): 60 min  ? ?Short Term Goals: ?Week 1:  PT Short Term Goal 1 (Week 1): Patient will perform basic transfer with CGA consistently. ?PT Short Term Goal 2 (Week 1): Patient will ambulate >100 feet using LRAD with CGA. ?PT Short Term Goal 3 (Week 1): Patient will perform 6 steps using R rail with min A. ? ?Skilled Therapeutic Interventions/Progress Updates: Pt presents supine in bed asleep but aroused quickly and agreed to therapy.  Pt requires CGA to close sup for sup to sit.  Pt transferred sit to stand w/ CGA w/ occasional verbal cues for hand placement, w/ adequate forward lean to improve independence w/o posterior LOB.  Pt amb short distance in room w/ RW and CGA to w/c.  B knee high TEDS donned w/ Total A and pt donned shoes w/ set-up only.  Pt unable to perform in [place] position.  Pt wheeled to main gym for time and energy conservation.  Pt performed multiple sit to stand transfers during session w/ only CGA and occasional verbal cues.  Pt performed standing and hooking horseshoes over hoop using B UEs and crossing over midline w/ normal BOS, narrow, and L foot on red Dynadisc.  Pt performed toe taps to cones in front of each foot w/ HHA, as well as crossing to contralateral cone w/ LOB.  Pt also performed stepping over and back over cane w/ B HHA from front w/ complete accuracy (stepping w/o touching cane) 20% of trials.  Pt returned to room and amb BTB x 20' w/ RW and CGA to close supervision.  Pt transferred sit to supine w/ close supervision and then verbal cues for bridging to center o bed.  Bed alarm on and all needs in reach. ?   ? ?Therapy Documentation ?Precautions:  ?Precautions ?Precautions: Fall ?Restrictions ?Weight Bearing Restrictions: No ?General: ?  ?Vital Signs: ? ?Pain:0/10 ?Pain  Assessment ?Pain Scale: 0-10 ?Pain Score: 0-No pain ?Faces Pain Scale: No hurt ?Mobility: ?  ? ? ?Therapy/Group: Individual Therapy ? ?Ladoris Gene ?12/29/2021, 12:15 PM  ?

## 2021-12-29 NOTE — Telephone Encounter (Signed)
Have we been able to reach her to schedule appointment?  ?

## 2021-12-29 NOTE — Progress Notes (Signed)
Speech Language Pathology TBI Note ? ?Patient Details  ?Name: Kayla Gray ?MRN: [id number] ?Date of Birth: 17-Aug-1936 ? ?Today's Date: 12/29/2021 ?SLP Individual Time: [id number] ?SLP Individual Time Calculation (min): 60 min ? ?Short Term Goals: ?Week 1: SLP Short Term Goal 1 (Week 1): Pt will complete MBS to objectively assess oropharyngeal functioning and determine least restrictive diet with 100% completion. ?SLP Short Term Goal 1 - Progress (Week 1): Met ?SLP Short Term Goal 2 (Week 1): Pt will consume recommended diet textures and liquids with no clinical s/sx concerning for aspiration and supervision for implementation of aspiration precautions. ?SLP Short Term Goal 3 (Week 1): Pt will utilize compensatory memory strategies to recall functional information with 80% accuracy given Min verbal cues. ?SLP Short Term Goal 4 (Week 1): Pt will complete functional problem-solving tasks with 80% accuracy given Min verbal cues. ?SLP Short Term Goal 5 (Week 1): Pt will complete generative naming tasks for personally-relevant information with 80% given Min verbal cues. ?SLP Short Term Goal 6 (Week 1): Pt will utilize oral hold during consumption of thin liquids via cup to aid in coordination and bolus cohesion to mitigate aspiration risk with supverision assistance. ? ?Skilled Therapeutic Interventions: Skilled treatment session focused on dysphagia and cognitive goals. SLP facilitated session by providing skilled observation with breakfast meal of Dys. 3 textures with thin liquids via the Provale cup. No overt s/s of aspiration noted. Recommend patient continue current diet. SLP also facilitated session by providing trials of thin liquids via cup with focus on small sips and oral holding for 3 seconds prior to swallow initiation. Patient consumed small sips with overall supervision level verbal cues but required Mod-Max verbal cues for oral hold as patient demonstrated oral manipulation of thins. No overt s/s of  aspiration noted. Recommend patient continue to use Provale cup. Patient participated in a mildly complex medication error task in which she required Mod verbal cues for problem solving and error awareness while interpreting medication labels and scanning for administration errors in a BID pill box. Patient left upright in bed with alarm on and all needs within reach. Continue with current plan of care.  ?   ? ?Pain ?Pain Assessment ?Pain Scale: 0-10 ?Pain Score: 0-No pain ?Faces Pain Scale: No hurt ? ?Agitated Behavior Scale: ?TBI ?Observation Details ?Observation Environment: CIR ?Start of observation period - Date: 12/29/21 ?Start of observation period - Time: 0724 ?End of observation period - Date: 12/29/21 ?End of observation period - Time: 0825 ?Agitated Behavior Scale (DO NOT LEAVE BLANKS) ?Short attention span, easy distractibility, inability to concentrate: Present to a slight degree ?Impulsive, impatient, low tolerance for pain or frustration: Absent ?Uncooperative, resistant to care, demanding: Absent ?Violent and/or threatening violence toward people or property: Absent ?Explosive and/or unpredictable anger: Absent ?Rocking, rubbing, moaning, or other self-stimulating behavior: Absent ?Pulling at tubes, restraints, etc.: Absent ?Wandering from treatment areas: Absent ?Restlessness, pacing, excessive movement: Absent ?Repetitive behaviors, motor, and/or verbal: Absent ?Rapid, loud, or excessive talking: Absent ?Sudden changes of mood: Absent ?Easily initiated or excessive crying and/or laughter: Absent ?Self-abusiveness, physical and/or verbal: Absent ?Agitated behavior scale total score: 15 ? ?Therapy/Group: Individual Therapy ? ?Ondra Deboard ?12/29/2021, 11:35 AM ?

## 2021-12-29 NOTE — Telephone Encounter (Signed)
Discussed with provider and patient is in rehab. Recommendations were to call her spouse to schedule.  ?

## 2021-12-29 NOTE — Progress Notes (Signed)
Occupational Therapy Session Note ? ?Patient Details  ?Name: Kayla Gray ?MRN: [id number] ?Date of Birth: 09-18-1936 ? ?Today's Date: 12/29/2021 ?OT Individual Time: 9449-6759 ?OT Individual Time Calculation (min): 74 min  ? ? ?Short Term Goals: ?Week 1:  OT Short Term Goal 1 (Week 1): Pt will complete ambualtory toilet transfers iwht CGA ?OT Short Term Goal 2 (Week 1): Pt will complete TTB transfer with CGA and min cuing for sequencing ?OT Short Term Goal 3 (Week 1): Pt will complete ADL with no grip slips out of LUE to demo improved attention, grasp and coordination ?OT Short Term Goal 4 (Week 1): Pt will groom in standing wiht CGA ? ?Skilled Therapeutic Interventions/Progress Updates:  ?  Pt received in bed with no pain reported  ? ?ADL: ?Pt completes ADL at overall supervision-CGA Level. Skilled interventions include: Vc for L attention/set up ADL items on L for visual scanning, VC for RW management as pt tends to get within 3 feet of destination and leave to the side, demo and VC for reacher use for LB dressing to don underwear and cuing fo rpacing during functional mobility. Pt stands for oral care demo improved activity tolerance and pt able to gather items from low drawer and doffed clothes from floor with CGA to place in dirty clothes. ? ?Pt left at end of session in recliner with exit alarm on, call light in reach and all needs met ? ? ?Therapy Documentation ?Precautions:  ?Precautions ?Precautions: Fall ?Restrictions ?Weight Bearing Restrictions: No ? ?Therapy/Group: Individual Therapy ? ?Lowella Dell Khyson Sebesta ?12/29/2021, 6:32 AM ?

## 2021-12-30 LAB — GLUCOSE, CAPILLARY: Glucose-Capillary: 209 mg/dL — ABNORMAL HIGH (ref 70–99)

## 2021-12-30 NOTE — Progress Notes (Signed)
Slept good. Declined scheduled Senna. LBM 03/17. BLE edema. Crani incision with staples & OTA. Kayla Gray A  ?

## 2021-12-30 NOTE — Progress Notes (Signed)
Physical Therapy TBI Note ? ?Patient Details  ?Name: Kayla Gray ?MRN: [id number] ?Date of Birth: 1936-03-05 ? ?Today's Date: 12/30/2021 ?PT Individual Time: 9924-2683 and 1435-1510 ?PT Individual Time Calculation (min): 58 min  and 35 min ? ?Short Term Goals: ?Week 1:  PT Short Term Goal 1 (Week 1): Patient will perform basic transfer with CGA consistently. ?PT Short Term Goal 2 (Week 1): Patient will ambulate >100 feet using LRAD with CGA. ?PT Short Term Goal 3 (Week 1): Patient will perform 6 steps using R rail with min A. ?Week 2:    ? ?Skilled Therapeutic Interventions/Progress Updates:  ?  Pt initially oob in recliner. ? ?Sit to stand w/cues for hand placement.  Gait 30f to commode w/cga, cues to correct forward lean and downward gaze. ?Commode transfer w/cues to center herself prior to sitting, use of grab bar, cga.  Pt performs hygiene independently and clothing management w/cga. ?Gait 564fto sink, washes hands w/supervision in standing. ? ?Gait [id number] w/downward gaze, cues to stay closer to walker, cues for upright trunk/mild forward lean, narrow base, decreased step length. ? ?Gait challenges included working on increased gait speed - minimal ability to comply w/cues but feels she is accelerating pace, dual cognitive challenges ie/simple math, practical math which result in significant decrease in cadence at times pausing. ?Sit to stand + sidestep + stand to sit repeated length of mat x 4 w/cga to light min assist. ? ?Functional standing balance: ?Standing reaching to limits of stability to transfer cones L to R, R to L for balance challenge w/single UE support on RW, cga, moves slowly/cautiously.  Crossbody reach using LUE for increased challenge.  Cga w/all.  ? ?Standing alternating long step to target first w/bilat UE support on RW, advanced to single UE support for increased challenge and min assist.  Increased difficulty clearing L vs RLE.  Pt found single UE support very  supportive. ? ?Pt left oob in recliner w/chair alarm set and needs in reach. ? ? ? ?PM session ?Denies pain ? ?Pt initially sleeping and slow to fully awaken. ?Supine to sit w/supervision and using rail. ?Dons shoes w/set up and additional time, maintains balance reaching over to floor during process. ? ?Gait 100ft 39fay room w/RW, cga, poor clearance LLE thru swing, occasionaly collides L foot w/L leg of walker, slow to realize, still drowsy from nap. ? ?Standing w/RW alternating heel taps on 4in step x 10 each ?Repeated x 2 sets. ? ?Step up/back onto 4in step using RW, alternating lead leg x 10 each w/min assist for balance. ? ?Gait 100ft [id number] w/RW w/cga improved but present to milder extent clearance LLE thru swing, attention to L w/gait. ? ?Commode transfer w/cga, toilting w/supervision, stands at sink w/RW to wash hands w/supervision, transfer to edge of bed w/cga.  Doffs shoes w/supervision.  Sit to supine w/supervision and use of bedrail.   ?Pt left supine w/rails up x 3, alarm set, bed in lowest position, and needs in reach. ? ?Therapy Documentation ?Precautions:  ?Precautions ?Precautions: Fall ?Restrictions ?Weight Bearing Restrictions: No ? ?  ?Agitated Behavior Scale: ?TBI ?Observation Details ?Observation Environment: cir ?Start of observation period - Date: 12/30/21 ?Start of observation period - Time: 1430 ?End of observation period - Date: 12/30/21 ?End of observation period - Time: 1500 ?Agitated Behavior Scale (DO NOT LEAVE BLANKS) ?Short attention span, easy distractibility, inability to concentrate: Present to a slight degree ?Impulsive, impatient, low tolerance for pain or frustration:  Absent ?Uncooperative, resistant to care, demanding: Absent ?Violent and/or threatening violence toward people or property: Absent ?Explosive and/or unpredictable anger: Absent ?Rocking, rubbing, moaning, or other self-stimulating behavior: Absent ?Pulling at tubes, restraints, etc.: Absent ?Wandering from  treatment areas: Absent ?Restlessness, pacing, excessive movement: Absent ?Repetitive behaviors, motor, and/or verbal: Absent ?Rapid, loud, or excessive talking: Absent ?Sudden changes of mood: Absent ?Easily initiated or excessive crying and/or laughter: Absent ?Self-abusiveness, physical and/or verbal: Absent ?Agitated behavior scale total score: 15 ? ? ? ? ?Therapy/Group: Individual Therapy ? ? ?Jerrilyn Cairo ?12/30/2021, 3:33 PM  ?

## 2021-12-30 NOTE — Progress Notes (Signed)
Speech Language Pathology Daily Session Note ? ?Patient Details  ?Name: Kayla Gray ?MRN: [id number] ?Date of Birth: 12/16/1935 ? ?Today's Date: 12/30/2021 ?SLP Individual Time: 1020-1100 ?SLP Individual Time Calculation (min): 40 min ? ?Short Term Goals: ?Week 1: SLP Short Term Goal 1 (Week 1): Pt will complete MBS to objectively assess oropharyngeal functioning and determine least restrictive diet with 100% completion. ?SLP Short Term Goal 1 - Progress (Week 1): Met ?SLP Short Term Goal 2 (Week 1): Pt will consume recommended diet textures and liquids with no clinical s/sx concerning for aspiration and supervision for implementation of aspiration precautions. ?SLP Short Term Goal 3 (Week 1): Pt will utilize compensatory memory strategies to recall functional information with 80% accuracy given Min verbal cues. ?SLP Short Term Goal 4 (Week 1): Pt will complete functional problem-solving tasks with 80% accuracy given Min verbal cues. ?SLP Short Term Goal 5 (Week 1): Pt will complete generative naming tasks for personally-relevant information with 80% given Min verbal cues. ?SLP Short Term Goal 6 (Week 1): Pt will utilize oral hold during consumption of thin liquids via cup to aid in coordination and bolus cohesion to mitigate aspiration risk with supverision assistance. ? ?Skilled Therapeutic Interventions: Skilled treatment session focused on cognitive goals. SLP facilitated session by continuing to focus on problem solving, organization, recall and overall safety with medications. SLP provided overall Min verbal cues for problem solving regarding interpretation of medication labels. SLP also provided Max verbal cues for patient to recall her current medications and their functions. Discussed how patient now takes medications multiple times a day and would benefit from BID pill box. Patient verbalized understanding and asked appropriate questions regarding buying one.  Will attempt organizing a BID pill box at  next session. Patient left upright in recliner with alarm on and all needs within reach. Continue with current plan of care.  ?   ? ?Pain ?Pain Assessment ?Pain Scale: 0-10 ?Pain Score: 2  ?Pain Type: Acute pain ?Pain Location: Head ?Pain Descriptors / Indicators: Headache ?Pain Intervention(s): Medication (See eMAR) ? ?Therapy/Group: Individual Therapy ? ?Kayla Gray ?12/30/2021, 12:23 PM ?

## 2021-12-30 NOTE — Progress Notes (Signed)
Occupational Therapy Session Note ? ?Patient Details  ?Name: Kayla Gray ?MRN: [id number] ?Date of Birth: Apr 28, 1936 ? ?Today's Date: 12/30/2021 ?OT Individual Time: 2633-3545 ?OT Individual Time Calculation (min): 56 min  ? ? ?Short Term Goals: ?Week 1:  OT Short Term Goal 1 (Week 1): Pt will complete ambualtory toilet transfers iwht CGA ?OT Short Term Goal 2 (Week 1): Pt will complete TTB transfer with CGA and min cuing for sequencing ?OT Short Term Goal 3 (Week 1): Pt will complete ADL with no grip slips out of LUE to demo improved attention, grasp and coordination ?OT Short Term Goal 4 (Week 1): Pt will groom in standing wiht CGA ? ?Skilled Therapeutic Interventions/Progress Updates:  ?   ?Pt received in EOB with no pain agreeable to shower as pt will likely not get shower tomorrow. ?ADL: ?Pt completes ADL at overall Sup-MIN Level. Skilled interventions include: increased time for pt to gather/organize ADL items with question cuing, VC for RW management as pt abandons frequently when near transfer surface, VC for reacher strategies to don LB clothing, & cuing for safety awareness to sit to dry feet when standing on wet surface ? ?Overall pt requires increased time to process and organize self today needing 5-8 min soley to select clothing forgetting which drawer each is placed in. Pt requires increased A and direct VC for donning depends with reacher this date, however able ot don shoes with no A. ? ?Pt would benefit from trialing dressing on toilet/lower surface to see if this is more successful than reacher ? ?Pt left at end of session in redcliner with exit alarm on, call light in reach and all needs met ? ? ?Therapy Documentation ?Precautions:  ?Precautions ?Precautions: Fall ?Restrictions ?Weight Bearing Restrictions: No ? ?Other Treatments:   ? ? ?Therapy/Group: Individual Therapy ? ?[name] ?12/30/2021, 6:44 AM ?

## 2021-12-31 NOTE — Progress Notes (Signed)
?                                                       PROGRESS NOTE ? ? ?Subjective/Complaints: ? ?Needs to have BM, go to bathroom- bladder ? ?ROS:  ?Pt denies SOB, abd pain, CP, N/V/C/D, and vision changes ? ?Objective: ?  ?No results found. ?No results for input(s): WBC, HGB, HCT, PLT in the last 72 hours. ? ?No results for input(s): NA, K, CL, CO2, GLUCOSE, BUN, CREATININE, CALCIUM in the last 72 hours. ? ? ?Intake/Output Summary (Last 24 hours) at 12/31/2021 1028 ?Last data filed at 12/31/2021 0859 ?Gross per 24 hour  ?Intake 780 ml  ?Output 21 ml  ?Net 759 ml  ?  ? ?  ? ?Physical Exam: ?Vital Signs ?Blood pressure (!) 141/75, pulse 74, temperature 98 ?F (36.7 ?C), temperature source Oral, resp. rate 16, height '5\' 2"'$  (1.575 m), weight 72.9 kg, SpO2 96 %. ? ? ? ?General: awake, alert, appropriate, sitting up in bed; NAD ?HENT: conjugate gaze; oropharynx moist- crani incision healing- looks good-  ?CV: regular rate; no JVD ?Pulmonary: CTA B/L; no W/R/R- good air movement ?GI: soft, NT, ND, (+)BS ?Psychiatric: appropriate- flat ?Neurological: alert ?Ext: no clubbing, cyanosis, or edema ?Psych: pleasant and cooperative  ?Skin: Clean and intact without signs of breakdown, crani incision cdi ?Neuro:  Alert and oriented x 3. Normal insight and awareness. Intact Memory. Normal language and speech. Cranial nerve exam unremarkable. Mild left sided weakness. RUE and RLE 5/5. Sensory exam normal for light touch and pain in all 4 limbs. No limb ataxia or cerebellar signs. No abnormal tone appreciated.   ?Musculoskeletal: old TKA incisions, good knee ROM ? ? ?Assessment/Plan: ?1. Functional deficits which require 3+ hours per day of interdisciplinary therapy in a comprehensive inpatient rehab setting. ?Physiatrist is providing close team supervision and 24 hour management of active medical problems listed below. ?Physiatrist and rehab team continue to assess barriers to discharge/monitor patient progress toward functional  and medical goals ? ?Care Tool: ? ?Bathing ?   ?Body parts bathed by patient: Right arm, Left arm, Chest, Abdomen, Front perineal area, Buttocks, Right upper leg, Left upper leg, Face  ? Body parts bathed by helper: Right lower leg, Left lower leg ?  ?  ?Bathing assist Assist Level: Minimal Assistance - Patient > 75% ?  ?  ?Upper Body Dressing/Undressing ?Upper body dressing   ?What is the patient wearing?: Pull over shirt ?   ?Upper body assist Assist Level: Moderate Assistance - Patient 50 - 74% ?   ?Lower Body Dressing/Undressing ?Lower body dressing ? ? ?   ?What is the patient wearing?: Underwear/pull up ? ?  ? ?Lower body assist Assist for lower body dressing: Minimal Assistance - Patient > 75% ?   ? ?Toileting ?Toileting    ?Toileting assist Assist for toileting: Supervision/Verbal cueing ?Assistive Device Comment: walker ?  ?Transfers ?Chair/bed transfer ? ?Transfers assist ?   ? ?Chair/bed transfer assist level: Contact Guard/Touching assist ?  ?  ?Locomotion ?Ambulation ? ? ?Ambulation assist ? ?   ? ?Assist level: Contact Guard/Touching assist ?Assistive device: Walker-rolling ?Max distance: 20  ? ?Walk 10 feet activity ? ? ?Assist ?   ? ?Assist level: Contact Guard/Touching assist ?Assistive device: Walker-rolling  ? ?Walk 50 feet activity ? ? ?Assist   ? ?  Assist level: Minimal Assistance - Patient > 75% ?Assistive device: Hand held assist  ? ? ?Walk 150 feet activity ? ? ?Assist Walk 150 feet activity did not occur: Safety/medical concerns ? ?  ?  ?  ? ?Walk 10 feet on uneven surface  ?activity ? ? ?Assist Walk 10 feet on uneven surfaces activity did not occur: Safety/medical concerns ? ? ?  ?   ? ?Wheelchair ? ? ? ? ?Assist Is the patient using a wheelchair?: Yes ?Type of Wheelchair: Manual ?  ? ?Wheelchair assist level: Total Assistance - Patient < 25% ?Max wheelchair distance: 150 ft  ? ? ?Wheelchair 50 feet with 2 turns activity ? ? ? ?Assist ? ?  ?  ? ? ?Assist Level: Total Assistance - Patient <  25%  ? ?Wheelchair 150 feet activity  ? ? ? ?Assist ?   ? ? ?Assist Level: Total Assistance - Patient < 25%  ? ?Blood pressure (!) 141/75, pulse 74, temperature 98 ?F (36.7 ?C), temperature source Oral, resp. rate 16, height '5\' 2"'$  (1.575 m), weight 72.9 kg, SpO2 96 %. ? ?Medical Problem List and Plan: ?1. Functional deficits secondary to right SDH after fall. Pt s/p craniotomy ?            -patient may  shower ?            -ELOS/Goals: 12-14 days, supervision ? Continue CIR- PT, OT and SLP ?2.  Antithrombotics: ?-DVT/anticoagulation:  Pharmaceutical: Lovenox ?            -antiplatelet therapy: N/A due to bleed.  ?3. Pain Management: Tylenol prn.  ?4. Mood: LCSW to follow for evaluation and support.  ?            -antipsychotic agents: N/A ?5. Neuropsych: This patient is capable of making decisions on her own behalf. ?6. Skin/Wound Care: Routine pressure relief measures.  ?7. Fluids/Electrolytes/Nutrition: encourage PO ? -added protein ?8. HTN: Monitor BP TID--continue metoprolol and Losartan.  ?9. Chronic diastolic CHF: Low salt diet.  ?  ?Filed Weights  ? 12/28/21 0447 12/29/21 0500 12/31/21 0427  ?Weight: 73.2 kg 70.5 kg 72.9 kg  ?  ? 3/17 weights dropping, question accuracy ? 3/19- Weights somewhat variable, but overall within 2 kg- con't to monitor ?10. Pre-diabetes:  Hgb A1c-5.7. Added low carb restrictions.  ?-  well controlled. Dc cbg's ?--RD   for diet education.  ?11. H/o SVT/ PAF: Monitor HR TID--continue metoprolol. Zio patch to be mailed to pt ?            -apparently had a short run of VT prior transfer this morning ? -no issues, sx so far ?12. Seizure prophylaxis: Continue Keppra BID.  ?13. Hyperactive bladder: Now on ditropan.  ?            -pt reports baseline urinary frequency ? 3/19- stable- no changes ?14. Post-op anemia ? -hgb 9.9 ? -add fe supp ? -recheck cbc Monday ?15. Dysphagia: ? --MBS notable for oropharyngeal dysphagia which correlates with her complaints of drooling more after eating.  Likely exacerbation of her chronic problem ? -pt cleared for D3/thins with provale cup to help limit fluid bolus ? -doing well with diet/precautions ?  ? ?LOS: ?6 days ?A FACE TO FACE EVALUATION WAS PERFORMED ? ?Mesa Janus ?12/31/2021, 10:28 AM  ? ?  ?

## 2022-01-01 LAB — CBC
HCT: 30.3 % — ABNORMAL LOW (ref 36.0–46.0)
Hemoglobin: 9.8 g/dL — ABNORMAL LOW (ref 12.0–15.0)
MCH: 28.8 pg (ref 26.0–34.0)
MCHC: 32.3 g/dL (ref 30.0–36.0)
MCV: 89.1 fL (ref 80.0–100.0)
Platelets: 207 10*3/uL (ref 150–400)
RBC: 3.4 MIL/uL — ABNORMAL LOW (ref 3.87–5.11)
RDW: 14.3 % (ref 11.5–15.5)
WBC: 6.8 10*3/uL (ref 4.0–10.5)
nRBC: 0 % (ref 0.0–0.2)

## 2022-01-01 LAB — BASIC METABOLIC PANEL
Anion gap: 7 (ref 5–15)
BUN: 19 mg/dL (ref 8–23)
CO2: 24 mmol/L (ref 22–32)
Calcium: 8.7 mg/dL — ABNORMAL LOW (ref 8.9–10.3)
Chloride: 108 mmol/L (ref 98–111)
Creatinine, Ser: 0.96 mg/dL (ref 0.44–1.00)
GFR, Estimated: 59 mL/min — ABNORMAL LOW (ref >60)
Glucose, Bld: 98 mg/dL (ref 70–99)
Potassium: 4.3 mmol/L (ref 3.5–5.1)
Sodium: 139 mmol/L (ref 135–145)

## 2022-01-01 MED ORDER — PANTOPRAZOLE 2 MG/ML SUSPENSION
40.0000 mg | Freq: Every day | ORAL | Status: DC
  Administered 2022-01-02 – 2022-01-03 (×2): 40 mg
  Filled 2022-01-01: qty 20

## 2022-01-01 MED ORDER — LEVETIRACETAM 100 MG/ML PO SOLN
500.0000 mg | Freq: Two times a day (BID) | ORAL | Status: DC
  Administered 2022-01-01 – 2022-01-02 (×2): 500 mg via ORAL
  Filled 2022-01-01 (×2): qty 5

## 2022-01-01 NOTE — Progress Notes (Signed)
Occupational Therapy Session Note ? ?Patient Details  ?Name: Kayla Gray ?MRN: [id number] ?Date of Birth: [birth year] ? ?Today's Date: 01/01/2022 ?OT Individual Time: 1516-1600 ?OT Individual Time Calculation (min): 44 min  ? ? ?Short Term Goals: ?Week 1:  OT Short Term Goal 1 (Week 1): Pt will complete ambualtory toilet transfers iwht CGA ?OT Short Term Goal 2 (Week 1): Pt will complete TTB transfer with CGA and min cuing for sequencing ?OT Short Term Goal 3 (Week 1): Pt will complete ADL with no grip slips out of LUE to demo improved attention, grasp and coordination ?OT Short Term Goal 4 (Week 1): Pt will groom in standing wiht CGA ?  ? ?Skilled Therapeutic Interventions/Progress Updates:  ?  Pt received in bed, awake.  Pt agreeable to an afternoon shower and then donning her evening gown as she did not have anymore therapy sessions.   ?See ADL documentation below. Pt needs very light CGA to close S.  Frequent cues for safe technique and strategy.  Sitting for most of shower, being careful when rising to stand in shower not to hit head on soap dish, ensuring slippers all the way on, etc.  Min cues over all.  She recalled she had a heart valve replacement here 10 years ago but thought she showered by herself yesterday. She had showered Sat with OT.  ? ?Pt very pleasant and cooperative., resting in bed with all needs met.  ? ?Therapy Documentation ?Precautions:  ?Precautions ?Precautions: Fall ?Restrictions ?Weight Bearing Restrictions: No ? ?  ?Vital Signs: ?Therapy Vitals ?Temp: 98.3 ?F (36.8 ?C) ?Temp Source: Oral ?Pulse Rate: 66 ?Resp: 16 ?BP: 120/66 ?Patient Position (if appropriate): Lying ?Oxygen Therapy ?SpO2: 100 % ?O2 Device: Room Air ?Pain: ?Pain Assessment ?Pain Score: 0-No pain ?ADL: ?ADL ?Eating: Supervision/safety ?Grooming: Supervision/safety ?Where Assessed-Grooming: Standing at sink ?Upper Body Bathing: Supervision/safety ?Where Assessed-Upper Body Bathing: Shower ?Lower Body Bathing:  Supervision/safety ?Where Assessed-Lower Body Bathing: Shower ?Upper Body Dressing: Supervision/safety ?Where Assessed-Upper Body Dressing: Standing at sink ?Lower Body Dressing: Minimal assistance ?Where Assessed-Lower Body Dressing: Other (Comment) (toilet (A over L toes with underwear)) ?Toileting: Supervision/safety ?Where Assessed-Toileting: Toilet ?Toilet Transfer: Contact guard ?Toilet Transfer Method: Ambulating ?Walk-In Shower Transfer: Contact guard ?Walk-In Shower Transfer Method: Ambulating ?Walk-In Shower Equipment: Grab bars, Transfer tub bench ? ? ?Therapy/Group: Individual Therapy ? ?Vevay ?01/01/2022, 4:29 PM ?

## 2022-01-01 NOTE — Progress Notes (Signed)
Slept well last night. Denies any pain. Medications well tolerated when crushed with apple sauce. Safety maintained at all times. ?

## 2022-01-01 NOTE — Progress Notes (Signed)
Staples removed per MD order. No complications noted. ?Sheela Stack, LPN  ?

## 2022-01-01 NOTE — Progress Notes (Signed)
Speech Language Pathology TBI Note ? ?Patient Details  ?Name: Kayla Gray ?MRN: [id number] ?Date of Birth: 11-26-1935 ? ?Today's Date: 01/01/2022 ?SLP Individual Time: 8003-4917 ?SLP Individual Time Calculation (min): 35 min ? ?Short Term Goals: ?Week 1: SLP Short Term Goal 1 (Week 1): Pt will complete MBS to objectively assess oropharyngeal functioning and determine least restrictive diet with 100% completion. ?SLP Short Term Goal 1 - Progress (Week 1): Met ?SLP Short Term Goal 2 (Week 1): Pt will consume recommended diet textures and liquids with no clinical s/sx concerning for aspiration and supervision for implementation of aspiration precautions. ?SLP Short Term Goal 3 (Week 1): Pt will utilize compensatory memory strategies to recall functional information with 80% accuracy given Min verbal cues. ?SLP Short Term Goal 4 (Week 1): Pt will complete functional problem-solving tasks with 80% accuracy given Min verbal cues. ?SLP Short Term Goal 5 (Week 1): Pt will complete generative naming tasks for personally-relevant information with 80% given Min verbal cues. ?SLP Short Term Goal 6 (Week 1): Pt will utilize oral hold during consumption of thin liquids via cup to aid in coordination and bolus cohesion to mitigate aspiration risk with supverision assistance. ? ?Skilled Therapeutic Interventions:Skilled ST services focused on cognitive skills. Pt was able to list reason for admission, and vague physical changes "I can't move well" along with vague changes in swallow function, but denies cognitive changes. SLP facilitated basic problem solving and recall skills in novel card task, Blink, pt demonstrated mod I. Pt required max A verbal cues however when cask task was advanced to mildly complex problem solving, due to limited ability to plan ahead. Pt was left in room with call bell within reach and bed alarm set. SLP recommends to continue skilled services. ?   ? ?Pain ?Pain Assessment ?Pain Score: 0-No  pain ? ?Agitated Behavior Scale: ?TBI ?Observation Details ?Observation Environment: CIR ?Start of observation period - Date: 01/01/22 ?Start of observation period - Time: 0910 ?End of observation period - Date: 01/01/22 ?End of observation period - Time: 0945 ?Agitated Behavior Scale (DO NOT LEAVE BLANKS) ?Short attention span, easy distractibility, inability to concentrate: Absent ?Impulsive, impatient, low tolerance for pain or frustration: Absent ?Uncooperative, resistant to care, demanding: Absent ?Violent and/or threatening violence toward people or property: Absent ?Explosive and/or unpredictable anger: Absent ?Rocking, rubbing, moaning, or other self-stimulating behavior: Absent ?Pulling at tubes, restraints, etc.: Absent ?Wandering from treatment areas: Absent ?Restlessness, pacing, excessive movement: Absent ?Repetitive behaviors, motor, and/or verbal: Absent ?Rapid, loud, or excessive talking: Absent ?Sudden changes of mood: Absent ?Easily initiated or excessive crying and/or laughter: Absent ?Self-abusiveness, physical and/or verbal: Absent ?Agitated behavior scale total score: 14 ? ?Therapy/Group: Individual Therapy ? ?Kenaz Olafson ?01/01/2022, 12:50 PM ?

## 2022-01-01 NOTE — Progress Notes (Signed)
Physical Therapy Session Note ? ?Patient Details  ?Name: Kayla Gray ?MRN: [id number] ?Date of Birth: 1936-02-05 ? ?Today's Date: 01/01/2022 ?PT Individual Time: 1000-1115 ?PT Individual Time Calculation (min): 75 min  ? ?Short Term Goals: ?Week 1:  PT Short Term Goal 1 (Week 1): Patient will perform basic transfer with CGA consistently. ?PT Short Term Goal 2 (Week 1): Patient will ambulate >100 feet using LRAD with CGA. ?PT Short Term Goal 3 (Week 1): Patient will perform 6 steps using R rail with min A. ? ?Skilled Therapeutic Interventions/Progress Updates:  ?   ?Patient in bed upon PT arrival. Patient alert and agreeable to PT session. Patient reported 5/10 "plantar fascitis" pain during session, RN made aware. PT provided repositioning, rest breaks, and distraction as pain interventions throughout session.  ? ?Therapeutic Activity: ?Bed Mobility: Patient performed supine to sit with mod I from a flat bed without use of bed rails with increased time. ?Transfers: Patient performed sit to/from stand from the hospital bed, toilet, ADL recliner, standard arm chair x3 with supervision using RW. Provided verbal cues for forward weight shift and reaching back to sit. ? ?Gait Training:  ?Patient ambulated 220 feet, 102 feet, and 212 feet using RW with supervision over level tile and carpet. Ambulated with significantly decreased gait speed, decreased step length and height, forward trunk lean, and downward head gaze.Marland Kitchen Provided verbal cues for erect posture, safe proximity to RW, and leading with her heel to improve foot clearance and step length. ?Patient ascended/descended 12x6" steps using R rail with CGA. Performed step-to gait pattern leading with L while ascending and R while descending. Provided cues for technique and sequencing.  ?Ambulated up/down 8"platform, over 2 threshold height items, performed figure 8 walk around 2 cones then returned over simulated thresholds, and 8" platform with CGA for safety, min A  on 8" step up, and min cuing for sequencing and mod cuing for safe technique and AD management throughout.  ?Ambulated 60 feet with 1x180 deg and 1x90 deg turn with shopping cart to simulate community level mobility in a familiar setting with CGA-close supervision and cues for increased step height and safe proximity to cart throughout.  ? ?Patient in bed at end of session with breaks locked, bed alarm set, and all needs within reach. Patient agreeable to sit in the recliner for lunch, RN made aware. ? ?Therapy Documentation ?Precautions:  ?Precautions ?Precautions: Fall ?Restrictions ?Weight Bearing Restrictions: No ? ? ? ?Therapy/Group: Individual Therapy ? ?Doreene Burke PT, DPT ? ?01/01/2022, 12:23 PM  ?

## 2022-01-01 NOTE — Progress Notes (Signed)
?                                                       PROGRESS NOTE ? ? ?Subjective/Complaints: ? ?Had a good night. Slept well. Tolerating therapies. Pain controlled ? ?ROS: Patient denies fever, rash, sore throat, blurred vision, dizziness, nausea, vomiting, diarrhea, cough, shortness of breath or chest pain, joint or back/neck pain, headache, or mood change.  ? ?Objective: ?  ?No results found. ?Recent Labs  ?  01/01/22 ?3818  ?WBC 6.8  ?HGB 9.8*  ?HCT 30.3*  ?PLT 207  ? ? ?Recent Labs  ?  01/01/22 ?2993  ?NA 139  ?K 4.3  ?CL 108  ?CO2 24  ?GLUCOSE 98  ?BUN 19  ?CREATININE 0.96  ?CALCIUM 8.7*  ? ? ? ?Intake/Output Summary (Last 24 hours) at 01/01/2022 1212 ?Last data filed at 01/01/2022 0756 ?Gross per 24 hour  ?Intake 780 ml  ?Output --  ?Net 780 ml  ?  ? ?  ? ?Physical Exam: ?Vital Signs ?Blood pressure (!) 124/50, pulse 79, temperature 98.3 ?F (36.8 ?C), temperature source Oral, resp. rate 20, height '5\' 2"'$  (1.575 m), weight 73.9 kg, SpO2 96 %. ? ? ? ?Constitutional: No distress . Vital signs reviewed. ?HEENT: NCAT, EOMI, oral membranes moist ?Neck: supple ?Cardiovascular: RRR without murmur. No JVD    ?Respiratory/Chest: CTA Bilaterally without wheezes or rales. Normal effort    ?GI/Abdomen: BS +, non-tender, non-distended ?Ext: no clubbing, cyanosis, or edema ?Psych: pleasant and cooperative  ?Skin: Clean and intact without signs of breakdown, crani incision cdi with staples ?Neuro:  Alert and oriented x 3. Normal insight and awareness. Intact Memory. Normal language and speech. Cranial nerve exam unremarkable. Mild left sided weakness. RUE and RLE 5/5. Sensory exam normal for light touch and pain in all 4 limbs. No limb ataxia or cerebellar signs. No abnormal tone appreciated.   ?Musculoskeletal: old TKA incisions, good knee ROM ? ? ?Assessment/Plan: ?1. Functional deficits which require 3+ hours per day of interdisciplinary therapy in a comprehensive inpatient rehab setting. ?Physiatrist is providing close  team supervision and 24 hour management of active medical problems listed below. ?Physiatrist and rehab team continue to assess barriers to discharge/monitor patient progress toward functional and medical goals ? ?Care Tool: ? ?Bathing ?   ?Body parts bathed by patient: Right arm, Left arm, Chest, Abdomen, Front perineal area, Buttocks, Right upper leg, Left upper leg, Face  ? Body parts bathed by helper: Right lower leg, Left lower leg ?  ?  ?Bathing assist Assist Level: Minimal Assistance - Patient > 75% ?  ?  ?Upper Body Dressing/Undressing ?Upper body dressing   ?What is the patient wearing?: Pull over shirt ?   ?Upper body assist Assist Level: Moderate Assistance - Patient 50 - 74% ?   ?Lower Body Dressing/Undressing ?Lower body dressing ? ? ?   ?What is the patient wearing?: Underwear/pull up ? ?  ? ?Lower body assist Assist for lower body dressing: Minimal Assistance - Patient > 75% ?   ? ?Toileting ?Toileting    ?Toileting assist Assist for toileting: Supervision/Verbal cueing ?Assistive Device Comment: walker ?  ?Transfers ?Chair/bed transfer ? ?Transfers assist ?   ? ?Chair/bed transfer assist level: Contact Guard/Touching assist ?  ?  ?Locomotion ?Ambulation ? ? ?Ambulation assist ? ?   ? ?  Assist level: Contact Guard/Touching assist ?Assistive device: Walker-rolling ?Max distance: 20  ? ?Walk 10 feet activity ? ? ?Assist ?   ? ?Assist level: Contact Guard/Touching assist ?Assistive device: Walker-rolling  ? ?Walk 50 feet activity ? ? ?Assist   ? ?Assist level: Minimal Assistance - Patient > 75% ?Assistive device: Hand held assist  ? ? ?Walk 150 feet activity ? ? ?Assist Walk 150 feet activity did not occur: Safety/medical concerns ? ?  ?  ?  ? ?Walk 10 feet on uneven surface  ?activity ? ? ?Assist Walk 10 feet on uneven surfaces activity did not occur: Safety/medical concerns ? ? ?  ?   ? ?Wheelchair ? ? ? ? ?Assist Is the patient using a wheelchair?: Yes ?Type of Wheelchair: Manual ?  ? ?Wheelchair  assist level: Total Assistance - Patient < 25% ?Max wheelchair distance: 150 ft  ? ? ?Wheelchair 50 feet with 2 turns activity ? ? ? ?Assist ? ?  ?  ? ? ?Assist Level: Total Assistance - Patient < 25%  ? ?Wheelchair 150 feet activity  ? ? ? ?Assist ?   ? ? ?Assist Level: Total Assistance - Patient < 25%  ? ?Blood pressure (!) 124/50, pulse 79, temperature 98.3 ?F (36.8 ?C), temperature source Oral, resp. rate 20, height '5\' 2"'$  (1.575 m), weight 73.9 kg, SpO2 96 %. ? ?Medical Problem List and Plan: ?1. Functional deficits secondary to right SDH after fall. Pt s/p craniotomy ?            -patient may  shower ?            -ELOS/Goals: 12-14 days, supervision ? -Continue CIR therapies including PT, OT, and SLP  ?2.  Antithrombotics: ?-DVT/anticoagulation:  Pharmaceutical: Lovenox ?            -antiplatelet therapy: N/A due to bleed.  ?3. Pain Management: Tylenol prn.  ?4. Mood: LCSW to follow for evaluation and support.  ?            -antipsychotic agents: N/A ?5. Neuropsych: This patient is capable of making decisions on her own behalf. ?6. Skin/Wound Care:  remove staples from scalp today ?7. Fluids/Electrolytes/Nutrition: encourage PO ? -added protein ?8. HTN: Monitor BP TID--continue metoprolol and Losartan.  ?9. Chronic diastolic CHF: Low salt diet.  ?  ?Filed Weights  ? 12/29/21 0500 12/31/21 0427 01/01/22 0548  ?Weight: 70.5 kg 72.9 kg 73.9 kg  ?  ? 3/17 weights dropping, question accuracy ?3/20 weights stable ?10. Pre-diabetes:  Hgb A1c-5.7. Added low carb restrictions.  ?-  well controlled. Dc'ed cbg's ?--RD   for diet education.  ?11. H/o SVT/ PAF: Monitor HR TID--continue metoprolol. Zio patch to be mailed to pt ?            -apparently had a short run of VT prior transfer this morning ? -no issues, sx so far ?12. Seizure prophylaxis: Continue Keppra BID.  ?13. Hyperactive bladder: Now on ditropan.  ?            -pt reports baseline urinary frequency ? 3/20- stable- no changes ?14. Post-op anemia ? -hgb stable  at  9.8 3/20 ? -added fe supp ?   ?15. Dysphagia: ? --  oropharyngeal dysphagia which correlates with her complaints of drooling more after eating. Likely exacerbation of her chronic problem ? -pt cleared for D3/thins with provale cup to help limit fluid bolus ? -doing well with diet/current precautions ?  ? ?LOS: ?7 days ?A FACE TO  FACE EVALUATION WAS PERFORMED ? ?Meredith Staggers ?01/01/2022, 12:12 PM  ? ?  ?

## 2022-01-01 NOTE — Telephone Encounter (Signed)
Attempted to schedule.  No ans no vm  

## 2022-01-01 NOTE — Progress Notes (Signed)
Occupational Therapy Session Note ? ?Patient Details  ?Name: Kayla Gray ?MRN: [id number] ?Date of Birth: [birth year] ? ?Today's Date: 01/01/2022 ?OT Individual Time: 8295-6213 ?OT Individual Time Calculation (min): 31 min  ? ? ?Short Term Goals: ?Week 1:  OT Short Term Goal 1 (Week 1): Pt will complete ambualtory toilet transfers iwht CGA ?OT Short Term Goal 2 (Week 1): Pt will complete TTB transfer with CGA and min cuing for sequencing ?OT Short Term Goal 3 (Week 1): Pt will complete ADL with no grip slips out of LUE to demo improved attention, grasp and coordination ?OT Short Term Goal 4 (Week 1): Pt will groom in standing wiht CGA ? ?Skilled Therapeutic Interventions/Progress Updates:  ?Patient met lying supine in bed in agreement with OT treatment session. 0/10 pain reported at rest and with activity. Supine to EOB without external assist and HOB elevated. Patient able to don sweater with cues for attention to L but no external assist required. Able to don bilateral shoes and tie laces without external assist. Sit to stand from EOB with cues for proximity to RW. Functional mobility to dayroom with RW and close supervision A with cues for attention to L. Patient able to complete peg board activity with reference photo and 0 errors. Able to locate pegs on L without external assist. Donned gloves with cues for placement of fingers of L hand properly in glove. Patient demonstrates good bimanual manipulation with cleaning pegs before placing them into bucket on L. Returned to supine with supervision A. 3/3 toileting tasks in standard height commode and hand hygiene standing at sink level with supervision A and cues for safety. Session concluded with patient lying supine in bed with call bell within reach, bed alarm activated and all needs met.  ? ?Therapy Documentation ?Precautions:  ?Precautions ?Precautions: Fall ?Restrictions ?Weight Bearing Restrictions: No ?General: ?  ?Therapy/Group: Individual  Therapy ? ?[name] ?01/01/2022, 12:22 PM ?

## 2022-01-01 NOTE — Consult Note (Signed)
Neuropsychological Consultation ? ? ?Patient:   Kayla Gray  ? ?DOB:   [birth year] ? ?MR Number:  [id number] ? ?Location:  [place] [facility] [address] [facility] ?[id number] [facility] ?[place] [address] ?Dept: [phone] ?Loc: [phone] ?          ?Date of Service:   01/01/2022 ? ?Start Time:   8 AM ?End Time:   9 AM ? ?Provider/Observer:  Ilean Skill, Psy.D.   ?    Clinical Neuropsychologist ?     ? ?Billing Code/Service: [id number] ? ?Chief Complaint:    Kayla Gray is an 86 year old female who has a past medical history including hypertension, iron deficiency, chronic congestive heart failure, breast cancer, SVT.  Patient was admitted to [facility] on 12/18/2021 after fall (patient reports mopping her floor), striking the back of her head.  Patient reported headache and was vomiting at admission.  CT head done revealing 1.3 cm right subdural hematoma along falx and right tentorium with 1 cm leftward midline shift and moderate sized occipital scalp hematoma.  Patient had right Craney for evacuation of subdural hematoma with placement of ST drain.  Patient was lethargic with ongoing headaches postoperatively and EEG revealed right frontal cortical dysfunction with moderate diffuse encephalopathy.  Later, cervical MRI done for work-up showing extra-medullary hemorrhage extending to C5 with mild to moderate narrowing of upper cervical canal without mass effect and moderate advanced discs degeneration.  Patient admitted to CIR due to functional decline. ? ?Reason for Service:  Patient was referred for neuropsychological consultation due to concerns around cognitive functioning and coping with extended hospital stay.  Below is the HPI for the current admission. ? ?HPI: Kayla Gray is an 86 year old female with history of HTN, iron deficiency anemia, chronic dCHF, breast cancer, SVT who was admitted to [facility] on 12/18/21 after fall  (while mopping her floor), striking back of her head with syncope. She was noted to have hematoma on the back of her head, reported HA and was vomiting at admission. CT head done revealing 1.3 cm R-SDH along falx and right tentorium with 1 cm leftward midline shift and moderate size occipital scalp hematoma. She was taken to OR for right crani for evacuation of SDH with placement of SD drain by Dr. Cari Caraway the same day. Post op with lethargy with ongoing HA and EEG done revealing right frontal cortical dysfunction with  moderate diffuse encephalopathy and was negative for seizures.  ?  ?AMS felt to be due to narcotics which was discontinued and follow up CT head stable post op changes with blood extending along dorsal and ventral aspects of craniocervical junction into upper cervical spine surrounding cord. Cervical MRI done for work up showing extramedullary hemorrhage extending to C5 with mild to moderate narrowing of upper cervical canal without mass effect and moderate advanced disc degeneration C6-C7 with severe bilateral neural foraminal narrowing. MRI Brain showed stable bleed with acute ischemia right superior frontal gyrus and right insular cortex. 2D echo showed EF 60-65%, mild MVR and functioning bioprosthetic AV.  Mentation has improved and cardiology consulted for input due to history of PAF. ZIO patch recommended to rule out cardiac source. BP has been labile and Losartan added for better control. Therapy ongoing and patient limited by weakness, balance deficits and need increased time for processing. CIR recommended due to functional decline. ? ?Current Status:  Patient was awake and alert and was oriented with good mental  status.  Patient reports that her mood has been positive and she appeared to be euthymic today.  Patient able to describe in general terms what it happened to her and insist that she was mopping the floor and assumes that she slipped on the wet floor.  While patient displayed  good expressive and receptive language and was able to address specific questions very appropriately she did display some issues with more demanding problem-solving and executive functioning although a clear description of her cognitive functioning prior to admission was not readily available.  Patient appears to have good understanding of safety risks and remains motivated for ongoing therapeutic interventions. ? ?Behavioral Observation: [name]  presents as a 86 y.o.-year-old Right handed Caucasian Female who appeared her stated age. her dress was Appropriate and she was Well Groomed and her manners were Appropriate to the situation.  her participation was indicative of Appropriate behaviors.  There were physical disabilities noted.  she displayed an appropriate level of cooperation and motivation.   ? ? ?Interactions:    Active Appropriate and Redirectable ? ?Attention:   abnormal and attention span appeared shorter than expected for age ? ?Memory:   within normal limits; recent and remote memory intact ? ?Visuo-spatial:  not examined ? ?Speech (Volume):  low ? ?Speech:   normal; normal ? ?Thought Process:  Coherent and Relevant ? ?Though Content:  WNL; not suicidal and not homicidal ? ?Orientation:   person, place, time/date, and situation ? ?Judgment:   Fair ? ?Planning:   Fair ? ?Affect:    Appropriate ? ?Mood:    Euthymic ? ?Insight:   Good ? ?Intelligence:   normal ? ? ?Medical History:   ?Past Medical History:  ?Diagnosis Date  ? Aortic stenosis   ? Bilateral cataracts   ? Bleeding nose   ? Thurs night (09/08/15) and Fri (09/09/15) left side.Bleeding didn't last long  ? Breast cancer ([place]) 1999  ? right breast lumpectomy and rad tx  ? Chronic diastolic (congestive) heart failure (HCC)   ? Depression   ? Diverticulosis   ? Gastroesophageal reflux disease   ? Heart murmur   ? Hyperlipidemia   ? Hypertension   ? Iron deficiency anemia   ? Obstructive sleep apnea   ? Osteoarthritis   ? Osteopenia   ?  Peptic ulcer disease   ? Personal history of radiation therapy   ? S/P aortic valve replacement with bioprosthetic valve 09/16/2015  ? 23 mm Edwards Intuity Elite bioprosthetic tissue valve  ? Skin cancer 2020  ? Stroke [facility])   ? 11/14/13  ? Supraventricular tachycardia ([place])   ? Syncope and collapse   ? Urinary tract infection   ? ? ?     ?Patient Active Problem List  ? Diagnosis Date Noted  ? SDH (subdural hematoma) 12/25/2021  ? Essential hypertension 12/24/2021  ? Subdural hematoma 12/18/2021  ? Diverticulitis 05/27/2019  ? Unsteady gait 12/27/2018  ? Paroxysmal atrial fibrillation ([place]) 11/14/2015  ? Encounter for therapeutic drug monitoring 09/29/2015  ? Chronic diastolic (congestive) heart failure (HCC)   ? SOB (shortness of breath) 08/17/2015  ? Angina pectoris ([place]) 08/17/2015  ? Low back pain 08/12/2015  ? Sinus tachycardia 01/06/2014  ? Osteoarthritis of both knees 01/06/2014  ? Aortic valve stenosis 11/27/2012  ? SVT (supraventricular tachycardia) ([place]) 11/27/2012  ? Syncope 11/27/2012  ? ? ? ?Psychiatric History:  No prior psychiatric history noted ? ?Family Med/Psych History:  ?Family History  ?Problem Relation Age of Onset  ?  Heart failure Mother   ? Breast cancer Daughter 4  ? Breast cancer Maternal Aunt   ? ? ?Impression/DX:  KEERTHI HAZELL is an 86 year old female who has a past medical history including hypertension, iron deficiency, chronic congestive heart failure, breast cancer, SVT.  Patient was admitted to [facility] on 12/18/2021 after fall (patient reports mopping her floor), striking the back of her head.  Patient reported headache and was vomiting at admission.  CT head done revealing 1.3 cm right subdural hematoma along falx and right tentorium with 1 cm leftward midline shift and moderate sized occipital scalp hematoma.  Patient had right Craney for evacuation of subdural hematoma with placement of ST drain.  Patient was lethargic with ongoing headaches  postoperatively and EEG revealed right frontal cortical dysfunction with moderate diffuse encephalopathy.  Later, cervical MRI done for work-up showing extra-medullary hemorrhage extending to C5 with mild to moderate

## 2022-01-02 DIAGNOSIS — D62 Acute posthemorrhagic anemia: Secondary | ICD-10-CM

## 2022-01-02 DIAGNOSIS — R1310 Dysphagia, unspecified: Secondary | ICD-10-CM

## 2022-01-02 NOTE — Progress Notes (Signed)
Patient ID: Kayla Gray, female   DOB: 17-Nov-1935, 86 y.o.   MRN: [id number] ? ?SW met with pt in room while PT present to provide updates from team conference, and d/c date 3/24. When discussing if her sister were able to assist, she reported that she lives here in [place] and not really about to help all the time. She thinks she may be able to come in for family edu. Pt aware SW will f/u with her dtr Kelli. ? ?54- SW spoke with pt dtr Kelli to provide updates. SW informed on HHPT/OT/SLP/aide and DME: RW and TTB. SW will order DME. No HHA preference. She will speak with her aunt about family edu on Thursday 9am-11am and will f/u. She is working on transportation home on Friday as she is in chemo. She also reports she has a cousin that has agreed to be in the home 3xs a week in the evening to help assist. ? ?DME ordered: RW and TTB with [facility] via parachute.  ? ?SW sent [facility] referral to [place] and waiting on follow-up.  ?*Referral declined as no HHA/SLP in this area. ? ?SW sent referral to Cory[facility] and waiting on follow-up.  ?*declined ? ?SW sent referral Aaron/[facility], Carolyn/[facility], Angie/[facility] ([place]), Amy/[facility] Cheryl/[facility], Jenny/[facility], Britney/[facility] and waiting on follow-up.  ? ?Denied HHAs ?Jason/[facility] ?Cory/[facility] ?[facility]- no disciplines in area ?[facility]- no disciplines available in area ?Carolyn/[facility]- not in network ?Britney/[facility]- not in contract ?Jason/[facility]- out over 1 wk PT, and PT/OT/SLP/aide out 2 weeks for services to begin. ? ?Loralee Pacas, MSW, LCSWA ?Office: [phone] ?Cell: [phone] ?Fax: [phone]  ?

## 2022-01-02 NOTE — Progress Notes (Signed)
?                                                       PROGRESS NOTE ? ? ?Subjective/Complaints: ? ?In good spirits. Up at sink washing up. Denies pain. Sleeping well ? ?ROS: Patient denies fever, rash, sore throat, blurred vision, dizziness, nausea, vomiting, diarrhea, cough, shortness of breath or chest pain, joint or back/neck pain, headache, or mood change.  ? ?Objective: ?  ?No results found. ?Recent Labs  ?  01/01/22 ?2585  ?WBC 6.8  ?HGB 9.8*  ?HCT 30.3*  ?PLT 207  ? ? ?Recent Labs  ?  01/01/22 ?2778  ?NA 139  ?K 4.3  ?CL 108  ?CO2 24  ?GLUCOSE 98  ?BUN 19  ?CREATININE 0.96  ?CALCIUM 8.7*  ? ? ? ?Intake/Output Summary (Last 24 hours) at 01/02/2022 1207 ?Last data filed at 01/01/2022 2000 ?Gross per 24 hour  ?Intake 480 ml  ?Output 10 ml  ?Net 470 ml  ?  ? ?  ? ?Physical Exam: ?Vital Signs ?Blood pressure 132/60, pulse 72, temperature 99.4 ?F (37.4 ?C), temperature source Oral, resp. rate 14, height '5\' 2"'$  (1.575 m), weight 73.2 kg, SpO2 94 %. ? ? ? ?Constitutional: No distress . Vital signs reviewed. ?HEENT: NCAT, EOMI, oral membranes moist ?Neck: supple ?Cardiovascular: RRR without murmur. No JVD    ?Respiratory/Chest: CTA Bilaterally without wheezes or rales. Normal effort    ?GI/Abdomen: BS +, non-tender, non-distended ?Ext: no clubbing, cyanosis, or edema ?Psych: pleasant and cooperative  ?Skin:scalp cdi with staples out ?Neuro:  Alert and oriented x 3. Fair insight and awareness. STM deficits. Normal language and speech. Cranial nerve exam unremarkable. Mild left sided weakness. RUE and RLE 5/5. Sensory exam normal for light touch and pain in all 4 limbs. No limb ataxia or cerebellar signs. No abnormal tone appreciated.   ?Musculoskeletal: old TKA incisions, good knee ROM ? ? ?Assessment/Plan: ?1. Functional deficits which require 3+ hours per day of interdisciplinary therapy in a comprehensive inpatient rehab setting. ?Physiatrist is providing close team supervision and 24 hour management of active  medical problems listed below. ?Physiatrist and rehab team continue to assess barriers to discharge/monitor patient progress toward functional and medical goals ? ?Care Tool: ? ?Bathing ?   ?Body parts bathed by patient: Right arm, Left arm, Chest, Abdomen, Front perineal area, Buttocks, Right upper leg, Left upper leg, Face  ? Body parts bathed by helper: Right lower leg, Left lower leg ?  ?  ?Bathing assist Assist Level: Minimal Assistance - Patient > 75% ?  ?  ?Upper Body Dressing/Undressing ?Upper body dressing   ?What is the patient wearing?: Pull over shirt ?   ?Upper body assist Assist Level: Moderate Assistance - Patient 50 - 74% ?   ?Lower Body Dressing/Undressing ?Lower body dressing ? ? ?   ?What is the patient wearing?: Underwear/pull up ? ?  ? ?Lower body assist Assist for lower body dressing: Minimal Assistance - Patient > 75% ?   ? ?Toileting ?Toileting    ?Toileting assist Assist for toileting: Supervision/Verbal cueing ?Assistive Device Comment: walker ?  ?Transfers ?Chair/bed transfer ? ?Transfers assist ?   ? ?Chair/bed transfer assist level: Supervision/Verbal cueing ?Chair/bed transfer assistive device: Walker ?  ?Locomotion ?Ambulation ? ? ?Ambulation assist ? ?   ? ?Assist level: Supervision/Verbal  cueing ?Assistive device: Walker-rolling ?Max distance: 220 ft  ? ?Walk 10 feet activity ? ? ?Assist ?   ? ?Assist level: Supervision/Verbal cueing ?Assistive device: Walker-rolling  ? ?Walk 50 feet activity ? ? ?Assist   ? ?Assist level: Supervision/Verbal cueing ?Assistive device: Walker-rolling  ? ? ?Walk 150 feet activity ? ? ?Assist Walk 150 feet activity did not occur: Safety/medical concerns ? ?Assist level: Supervision/Verbal cueing ?Assistive device: Walker-rolling ?  ? ?Walk 10 feet on uneven surface  ?activity ? ? ?Assist Walk 10 feet on uneven surfaces activity did not occur: Safety/medical concerns ? ? ?  ?   ? ?Wheelchair ? ? ? ? ?Assist Is the patient using a wheelchair?: Yes ?Type of  Wheelchair: Manual ?  ? ?Wheelchair assist level: Total Assistance - Patient < 25% ?Max wheelchair distance: 150 ft  ? ? ?Wheelchair 50 feet with 2 turns activity ? ? ? ?Assist ? ?  ?  ? ? ?Assist Level: Total Assistance - Patient < 25%  ? ?Wheelchair 150 feet activity  ? ? ? ?Assist ?   ? ? ?Assist Level: Total Assistance - Patient < 25%  ? ?Blood pressure 132/60, pulse 72, temperature 99.4 ?F (37.4 ?C), temperature source Oral, resp. rate 14, height '5\' 2"'$  (1.575 m), weight 73.2 kg, SpO2 94 %. ? ?Medical Problem List and Plan: ?1. Functional deficits secondary to right SDH after fall. Pt s/p craniotomy ?            -patient may  shower ?            -ELOS/Goals: 12-14 days, supervision ? -Continue CIR therapies including PT, OT, and SLP. Interdisciplinary team conference today to discuss goals, barriers to discharge, and dc planning.   ?2.  Antithrombotics: ?-DVT/anticoagulation:  Pharmaceutical: Lovenox ?            -antiplatelet therapy: N/A due to bleed.  ?3. Pain Management: Tylenol prn.  ?4. Mood: LCSW to follow for evaluation and support.  ?            -antipsychotic agents: N/A ?5. Neuropsych: This patient is capable of making decisions on her own behalf. ?6. Skin/Wound Care:  staples out ?7. Fluids/Electrolytes/Nutrition: encourage PO ? -added protein ?8. HTN: Monitor BP TID--continue metoprolol and Losartan.  ?9. Chronic diastolic CHF: Low salt diet.  ?  ?Filed Weights  ? 12/31/21 0427 01/01/22 0548 01/02/22 0500  ?Weight: 72.9 kg 73.9 kg 73.2 kg  ?  ?  ?3/21 weights stable ?10. Pre-diabetes:  Hgb A1c-5.7. Added low carb restrictions.  ?-  well controlled. Dc'ed cbg's ?--RD   for diet education.  ?11. H/o SVT/ PAF: Monitor HR TID--continue metoprolol. Zio patch to be mailed to pt ?            -no issues, sx so far ?12. Seizure prophylaxis: Continue Keppra BID.  ?13. Hyperactive bladder: Now on ditropan.  ?            -pt reports baseline urinary frequency ? 3/21- stable- no changes, emptying ?14. Post-op  anemia ? -hgb stable at  9.8 3/20 ? -added fe supp ?   ?15. Dysphagia: ? --  oropharyngeal dysphagia which correlates with her complaints of drooling more after eating. Likely exacerbation of her chronic problem ? -pt cleared for D3/thins with provale cup to help limit fluid bolus ? -doing well with diet/current precautions/cup ?  ? ?LOS: ?8 days ?A FACE TO FACE EVALUATION WAS PERFORMED ? ?Meredith Staggers ?01/02/2022, 12:07 PM  ? ?  ?

## 2022-01-02 NOTE — Progress Notes (Signed)
Speech Language Pathology Weekly Progress and Session Note ? ?Patient Details  ?Name: Kayla Gray ?MRN: [id number] ?Date of Birth: January 21, 1936 ? ?Beginning of progress report period: December 25, 2021 ?End of progress report period: January 02, 2022 ? ?Today's Date: 01/02/2022 ?SLP Individual Time: 7741-4239 ?SLP Individual Time Calculation (min): 45 min ? ?Short Term Goals: ?Week 1: SLP Short Term Goal 1 (Week 1): Pt will complete MBS to objectively assess oropharyngeal functioning and determine least restrictive diet with 100% completion. ?SLP Short Term Goal 1 - Progress (Week 1): Met ?SLP Short Term Goal 2 (Week 1): Pt will consume recommended diet textures and liquids with no clinical s/sx concerning for aspiration and supervision for implementation of aspiration precautions. ?SLP Short Term Goal 2 - Progress (Week 1): Met ?SLP Short Term Goal 3 (Week 1): Pt will utilize compensatory memory strategies to recall functional information with 80% accuracy given Min verbal cues. ?SLP Short Term Goal 3 - Progress (Week 1): Met ?SLP Short Term Goal 4 (Week 1): Pt will complete functional problem-solving tasks with 80% accuracy given Min verbal cues. ?SLP Short Term Goal 4 - Progress (Week 1): Met ?SLP Short Term Goal 5 (Week 1): Pt will complete generative naming tasks for personally-relevant information with 80% given Min verbal cues. ?SLP Short Term Goal 5 - Progress (Week 1): Met ?SLP Short Term Goal 6 (Week 1): Pt will utilize oral hold during consumption of thin liquids via cup to aid in coordination and bolus cohesion to mitigate aspiration risk with supverision assistance. ?SLP Short Term Goal 6 - Progress (Week 1): Not met ? ?  ?New Short Term Goals: ?Week 2: SLP Short Term Goal 1 (Week 2): STGs=LTGs due to ELOS ? ?Weekly Progress Updates: Patient has made functional gains and has met 5 of 6 STSs this reporting period. Currently, patient is consuming Dys. 3 textures with thin liquids with minimal overt s/s of  aspiration and overall supervision level verbal cues for use of swallowing compensatory strategies. Patient demonstrates behaviors consistent with a [place] level VII and requires overall Min A multimodal cues to complete functional and familiar tasks safely in regards to problem solving, recall, attention and awareness. Patient and family education ongoing. Patient would benefit from continued skilled SLP intervention to maximize her swallowing and cognitive functioning prior to discharge.  ?  ? ? ?Intensity: Minumum of 1-2 x/day, 30 to 90 minutes ?Frequency: 3 to 5 out of 7 days ?Duration/Length of Stay: 01/05/22 ?Treatment/Interventions: Cognitive remediation/compensation;Dysphagia/aspiration precaution training;Functional tasks;Internal/external aids;Medication managment;Patient/family education;Therapeutic Activities;Cueing hierarchy;Environmental controls ? ? ?Daily Session ? ?Skilled Therapeutic Interventions:  Skilled treatment session focused on dysphagia and cognitive goals. Upon arrival, patient was asleep in bed but easily awakened. SLP facilitated session by providing tray set-up with breakfast meal of Dys. 3 textures with thin liquids. Patient consumed thin liquids via cup with supervision level verbal cues needed for small sips. Patient with one aspiration event with thin liquids, suspect due to decreased attention to bolus as patient appeared externally distracted. Recommend patient continue current diet with ongoing use of the Provale cup. Patient's husband called during session with Min multimodal cues needed to discontinue phone call and focus on treatment session. Patient demonstrated alternating attention between self-feeding and basic conversation with Min verbal cues for redirection. Patient left upright in bed with alarm on and all needs within reach. Continue with current plan of care.    ? ? ?Pain ?Pain Assessment ?Pain Scale: 0-10 ?Pain Score: 0-No pain ? ?Therapy/Group: Individual  Therapy ? ?  Kayla Gray ?01/02/2022, 12:24 PM ? ? ? ? ? ? ?

## 2022-01-02 NOTE — Progress Notes (Signed)
Occupational Therapy Weekly Progress Note ? ?Patient Details  ?Name: Kayla Gray ?MRN: [id number] ?Date of Birth: 1936/07/19 ? ?Beginning of progress report period: December 26, 2021 ?End of progress report period: January 02, 2022 ? ?Today's Date: 01/02/2022 ?OT Individual Time: 1427-6701 ?OT Individual Time Calculation (min): 53 min  ? ? ?Patient has met 4 of 4 short term goals.  Pt is making good progress this reporting period improving to CGA-Supervision for ADLs and functional transfers. Pt continues to have memory deficits impacting efficiency of ADLs and recall of previous activities. Pt automatic with ADL task but at risk for falling because of occasional AD abandonment. Pt fam ed to happen at end of week in prep for DC ? ?Patient continues to demonstrate the following deficits: muscle weakness, decreased cardiorespiratoy endurance, decreased coordination and decreased motor planning, decreased attention to left, decreased initiation, decreased attention, decreased awareness, decreased problem solving, decreased safety awareness, decreased memory, and delayed processing, and decreased sitting balance, decreased standing balance, decreased postural control, hemiplegia, and decreased balance strategies and therefore will continue to benefit from skilled OT intervention to enhance overall performance with BADL and iADL. ? ?Patient progressing toward long term goals..  Continue plan of care. ? ?OT Short Term Goals ?Week 2:  OT Short Term Goal 1 (Week 2): STG=LTG d/t ELOS ?Week 3:    ? ?Skilled Therapeutic Interventions/Progress Updates:  ?   ?Pt received in bed with no pain  ?ADL: ?Pt completes ADL at overall supervision-CGA Level. Skilled interventions include: VC for no RW abandonment when in proximity to transfer surface + CGA as pt leaves RW to the side and trips on edge of TTB, but able to catch self with R hand. Pt able to verbalize, "well that was stupid, I shouldn't have left my walker." Pt bathes with cuing  for seated bathing except for peri/buttocks. Pt with decreased grip slips this date with LUE, but demo decreased calibration/force with LUE when attempting to open new toothbrush package. Pt requires cuing to sit for UB dressign to decrease fall risk/balance challenge to improve habit prior to DC home. Grooming in standing at sink with cuing to locate ADL items on L (1, cue). Pt attempts to apply deodorant a second time when viewing it at the sink and requires question cue to recall already completed when dressing on toilet. Improved use of reacher this date when seated on toilet to thread BLE with min  question cuing for threading LLE first ? ? ?Pt left at end of session in bed with exit alarm on, call light in reach and all needs met ? ? ?Therapy Documentation ?Precautions:  ?Precautions ?Precautions: Fall ?Restrictions ?Weight Bearing Restrictions: No ?General: ?  ?Vital Signs: ?  ?Pain: ?  ? ? ?Therapy/Group: Individual Therapy ? ?Lowella Dell Frans Valente ?01/02/2022, 6:53 AM  ?

## 2022-01-02 NOTE — Discharge Summary (Signed)
Physician Discharge Summary  ?Patient ID: ?Kayla Gray ?MRN: [id number] ?DOB/AGE: 1936-06-21 86 y.o. ? ?Admit date: 12/25/2021 ?Discharge date: 01/05/2022 ? ?Discharge Diagnoses:  ?Principal Problem: ?  SDH (subdural hematoma) ?Active Problems: ?  PAF (paroxysmal atrial fibrillation) ([place]) ?  Essential hypertension ?  Dysphagia ?  ABLA (acute blood loss anemia) ? ? ?Discharged Condition: stable ? ?Significant Diagnostic Studies: ?DG Swallowing Func-Speech Pathology ? ?Result Date: 12/27/2021 ?Table formatting from the original result was not included. Objective Swallowing Evaluation: Type of Study: MBS-Modified Barium Swallow Study  Patient Details Name: Kayla Gray MRN: [id number] Date of Birth: [birth year] Today's Date: 12/27/2021 Past Medical History: Past Medical History: Diagnosis Date  Aortic stenosis   Bilateral cataracts   Bleeding nose   Thurs night (09/08/15) and Fri (09/09/15) left side.Bleeding didn't last long  Breast cancer ([place]) 1999  right breast lumpectomy and rad tx  Chronic diastolic (congestive) heart failure (HCC)   Depression   Diverticulosis   Gastroesophageal reflux disease   Heart murmur   Hyperlipidemia   Hypertension   Iron deficiency anemia   Obstructive sleep apnea   Osteoarthritis   Osteopenia   Peptic ulcer disease   Personal history of radiation therapy   S/P aortic valve replacement with bioprosthetic valve 09/16/2015  23 mm Edwards Intuity Elite bioprosthetic tissue valve  Skin cancer 2020  Stroke ([place])   11/14/13  Supraventricular tachycardia (HCC)   Syncope and collapse   Urinary tract infection  Past Surgical History: Past Surgical History: Procedure Laterality Date  AORTIC VALVE REPLACEMENT N/A 09/16/2015  Procedure: AORTIC VALVE REPLACEMENT (AVR);  Surgeon: Rexene Alberts, MD;  Location: [facility];  Service: Open Heart Surgery;  Laterality: N/A;  BREAST BIOPSY Right 1999  breast ca radation  BREAST EXCISIONAL BIOPSY Left yrs ago   benign  BREAST LUMPECTOMY Right 1999  with radiation   CARDIAC CATHETERIZATION N/A 08/17/2015  Procedure: Left Heart Cath and Coronary Angiography;  Surgeon: Minna Merritts, MD;  Location: [facility];  Service: Cardiovascular;  Laterality: N/A;  CATARACT EXTRACTION, BILATERAL    CHOLECYSTECTOMY    CRANIOTOMY Right 12/18/2021  Procedure: CRANIOTOMY HEMATOMA EVACUATION SUBDURAL;  Surgeon: Meade Maw, MD;  Location: [facility];  Service: Neurosurgery;  Laterality: Right;  JOINT REPLACEMENT Bilateral   knee replacement  KNEE ARTHROSCOPY    right  KNEE ARTHROSCOPY    left  SKIN CANCER EXCISION  2020  TEE WITHOUT CARDIOVERSION N/A 09/16/2015  Procedure: TRANSESOPHAGEAL ECHOCARDIOGRAM (TEE);  Surgeon: Rexene Alberts, MD;  Location: [facility];  Service: Open Heart Surgery;  Laterality: N/A;  TONSILLECTOMY    VAGINAL HYSTERECTOMY   HPI: See H and P  Subjective: pt pleasant, husband present, both eating lunch  Recommendations for follow up therapy are one component of a multi-disciplinary discharge planning process, led by the attending physician.  Recommendations may be updated based on patient status, additional functional criteria and insurance authorization. Assessment / Plan / Recommendation Clinical Impressions 12/27/2021 Clinical Impression Per instrumental swallow study (MBSS), pt presents with mild oropharyngeal dysphagia. Oral phase c/b lingual pumping, bolus holding due to poor coordination + timing, decreased bolus cohesion, and delayed A-P transit. Pharyngeal phase c/b mildly decreased BOT retraction resulting in mild vallecular residuals post-swallow. One instances of sensate penetration with subsequent aspiration when consuming large sip of thin liquid via cup. Thin liquid via straw was not assessed due to overt, immediate aspiration sx's during BSE on 12/26/2021. To improve oral timing and pharyngeal protection, SLP provided verbal cues for  pt to perform oral hold for 1, 2, 3 and then swallow. Above mentioned strategy was objectively observed to improve  pt's overall oral coordination, timeliness of A-P transit, and decreased premature spillage of liquids.   Giving instrumental findings, recommend initiation of Dysphagia 3 with thin liquids via Provale cup (NO STRAWS) with adherence to aspiration precautions of slow rate, alert and upright, small + single bites/sips, use of Provale cup, and limiting distractions. May prompt oral hold with verbal cues "1, 2, 3, swallow." Pt provided with verbal recommendations and verbalized understanding, though will need reinforcement for adherence to precautions. RN notified and pt verbalized understanding.  SLP Visit Diagnosis Dysphagia, oropharyngeal phase (R13.12) Attention and concentration deficit following -- Frontal lobe and executive function deficit following -- Impact on safety and function Mild aspiration risk   No flowsheet data found.  Prognosis 12/27/2021 Prognosis for Safe Diet Advancement Good Barriers to Reach Goals Cognitive deficits Barriers/Prognosis Comment -- Diet Recommendations 12/27/2021 SLP Diet Recommendations Dysphagia 3 (Mech soft) solids;Thin liquid Liquid Administration via No straw;Cup Medication Administration Crushed with puree Compensations Minimize environmental distractions;Slow rate;Small sips/bites Postural Changes Seated upright at 90 degrees   Other Recommendations 12/27/2021 Recommended Consults -- Oral Care Recommendations Oral care BID Other Recommendations -- Follow Up Recommendations -- Assistance recommended at discharge -- Functional Status Assessment -- No flowsheet data found.  Oral Phase 12/27/2021 Oral Phase Impaired Oral - Pudding Teaspoon -- Oral - Pudding Cup -- Oral - Honey Teaspoon -- Oral - Honey Cup -- Oral - Nectar Teaspoon -- Oral - Nectar Cup Holding of bolus;Delayed oral transit;Decreased bolus cohesion;Lingual pumping;Premature spillage Oral - Nectar Straw Holding of bolus;Delayed oral transit;Decreased bolus cohesion;Lingual pumping;Premature spillage Oral - Thin  Teaspoon Delayed oral transit;Holding of bolus;Lingual pumping;Premature spillage;Decreased bolus cohesion Oral - Thin Cup Holding of bolus;Lingual pumping;Delayed oral transit;Decreased bolus cohesion;Premature spillage Oral - Thin Straw -- Oral - Puree Delayed oral transit;Weak lingual manipulation;Reduced posterior propulsion Oral - Mech Soft Delayed oral transit;Decreased bolus cohesion;Weak lingual manipulation Oral - Regular -- Oral - Multi-Consistency -- Oral - Pill -- Oral Phase - Comment Oral hold was effective in maximizing oral control and decreasing lingual pumping  Pharyngeal Phase 12/27/2021 Pharyngeal Phase Impaired Pharyngeal- Pudding Teaspoon -- Pharyngeal -- Pharyngeal- Pudding Cup -- Pharyngeal -- Pharyngeal- Honey Teaspoon -- Pharyngeal -- Pharyngeal- Honey Cup -- Pharyngeal -- Pharyngeal- Nectar Teaspoon -- Pharyngeal -- Pharyngeal- Nectar Cup Delayed swallow initiation-vallecula;Reduced tongue base retraction;Pharyngeal residue - valleculae Pharyngeal Material does not enter airway Pharyngeal- Nectar Straw Delayed swallow initiation-vallecula;Reduced tongue base retraction;Pharyngeal residue - valleculae Pharyngeal Material does not enter airway Pharyngeal- Thin Teaspoon Delayed swallow initiation-vallecula;Reduced tongue base retraction;Pharyngeal residue - valleculae Pharyngeal Material does not enter airway Pharyngeal- Thin Cup Delayed swallow initiation-vallecula;Reduced tongue base retraction;Pharyngeal residue - valleculae;Penetration/Aspiration during swallow;Trace aspiration Pharyngeal Material enters airway, passes BELOW cords and not ejected out despite cough attempt by patient Pharyngeal- Thin Straw -- Pharyngeal -- Pharyngeal- Puree Pharyngeal residue - valleculae Pharyngeal Material does not enter airway Pharyngeal- Mechanical Soft Pharyngeal residue - valleculae Pharyngeal Material does not enter airway Pharyngeal- Regular -- Pharyngeal -- Pharyngeal- Multi-consistency --  Pharyngeal -- Pharyngeal- Pill -- Pharyngeal -- Pharyngeal Comment Suspect aspiration due to distraction of verbal cues during test  Cervical Esophageal Phase  12/27/2021 Cervical Esophageal Phase WFL Pudding Teaspoon

## 2022-01-02 NOTE — Patient Care Conference (Signed)
Inpatient RehabilitationTeam Conference and Plan of Care Update ?Date: 01/02/2022   Time: 10:20 AM  ? ? ?Patient Name: Kayla Gray      ?Medical Record Number: [id number]  ?Date of Birth: [birth year] ?Sex: Female         ?Room/Bed: 4U98J/1B14N-82 ?Payor Info: Payor: [employer] / Plan: [employer] PPO / Product Type: *No Product type* /   ? ?Admit Date/Time:  12/25/2021  2:11 PM ? ?Primary Diagnosis:  SDH (subdural hematoma) ? ?Hospital Problems: Principal Problem: ?  SDH (subdural hematoma) ? ? ? ?Expected Discharge Date: Expected Discharge Date: 01/05/22 ? ?Team Members Present: ?Physician leading conference: Dr. Alger Simons ?Social Worker Present: Loralee Pacas, LCSWA ?Nurse Present: Dorthula Nettles, RN ?PT Present: Apolinar Junes, PT ?OT Present: Mariane Masters, OT ?SLP Present: Weston Anna, SLP ?PPS Coordinator present : Gunnar Fusi, SLP ? ?   Current Status/Progress Goal Weekly Team Focus  ?Bowel/Bladder ? ? pt is continent to b/b LBM 3/10 bladder scan are WNL  remain continent  toilet as needed   ?Swallow/Nutrition/ Hydration ? ? dys 3 and thin with provale cup only, min A  Supervision A  increasing amount if thin liquids, oral holds and swallow strategies.   ?ADL's ? ? Requires CGA for safety at times during bathing, but overall supervision with transfers and ADLs. Slow processing and memory deficits  supervision  d/c planning, ADL transfers, ADL retraining, cognitive retraining   ?Mobility ? ? CGA-supervision overall, gait >200 ft with RW  Supervision overall  Activity tolerance, balance, functional mobility, safety awareness, gait and stair training, d/c planning, patient/caregiver education   ?Communication ? ?           ?Safety/Cognition/ Behavioral Observations ? Mod A  Min A  intellectual awareness, basic problem solving, recall, and selective attention   ?Pain ? ? Denies Pain  remain pain free  assess pain q 4hr and prn   ?Skin ? ? incision to right side of head,  staples removed edges aproximated  incision to show signs of healing  assess skin q shift and prn   ? ? ?Discharge Planning:  ?Pt needs to be Mod I at d/c. Pt will d/c to home with husband who has limited mobility and cognitive defecits. Pt dtr can assist PRN as she is currently in chemo treatment. No other natural supports identified at this time.   ?Team Discussion: ?Doing great medically. Staples discontinued. Continent B/B, discontinue PVR's. No pain reported. Incision intact. Discharging home with husband, sister may possibly assist. ? ?Patient on target to meet rehab goals: ?yes, at supervision goal level with PT and OT. SLP working on higher level cognition. Maintain Provale cup. Coughed with thin liquids using regular cup.  ? ?*See Care Plan and progress notes for long and short-term goals.  ? ?Revisions to Treatment Plan:  ?Finalizing discharge plans and medications. ?  ?Teaching Needs: ?Family education, medication management, skin/wound care, safety awareness, transfer/gait training, etc. ?  ?Current Barriers to Discharge: ?SLP working with patient to reach supervision goals. ? ?Possible Resolutions to Barriers: ?Family education ?Maintain Provale cup ?Follow-up HH/Outpatient ?  ? ? Medical Summary ?Current Status: improving neurologically. staples removed from incision yesterday. bp controlled. tolerating diet ? Barriers to Discharge: Medical stability ?  ?Possible Resolutions to [employer]: daily assessment of labs and patient data ? ? ?Continued Need for Acute Rehabilitation Level of Care: The patient requires daily medical management by a physician with specialized training in physical medicine and rehabilitation for the following  reasons: ?Direction of a multidisciplinary physical rehabilitation program to maximize functional independence : Yes ?Medical management of patient stability for increased activity during participation in an intensive rehabilitation regime.: Yes ?Analysis of  laboratory values and/or radiology reports with any subsequent need for medication adjustment and/or medical intervention. : Yes ? ? ?I attest that I was present, lead the team conference, and concur with the assessment and plan of the team. ? ? ?Dorthula Nettles G ?01/02/2022, 2:17 PM  ? ? ? ? ? ? ?

## 2022-01-02 NOTE — Progress Notes (Signed)
Physical Therapy Weekly Progress Note ? ?Patient Details  ?Name: Kayla Gray ?MRN: [id number] ?Date of Birth: 03-26-1936 ? ?Beginning of progress report period: December 26, 2021 ?End of progress report period: January 02, 2022 ? ?Today's Date: 01/02/2022 ?PT Individual Time: 1130-1200 1330-1445 ?PT Individual Time Calculation (min): 30 min and 75 min ? ?Patient has met 3 of 3 short term goals.  Patient with great progress this week. Currently performing all mobility with supervision-CGA using RW. Continues to demonstrate increased fall risk indicated by gait speed, 0.2 m/s (>0.4 m/s = household ambulator), Berg Balance Score of 37/56 (<40=100% risk of falls), and 85.3 meters on 6MWT (52-89 yo F=392 meters) Will focus on d/c planning, patient/caregiver education and hands-on family training, balance, and fall prevention education prior to d/c this week.  ? ?Patient continues to demonstrate the following deficits muscle weakness, decreased cardiorespiratoy endurance, decreased coordination and decreased motor planning, decreased attention, decreased awareness, decreased problem solving, decreased safety awareness, decreased memory, delayed processing, and demonstrates behaviors consistent with [place] Level VII, and decreased standing balance, decreased postural control, and decreased balance strategies and therefore will continue to benefit from skilled PT intervention to increase functional independence with mobility. ? ?Patient progressing toward long term goals..  Continue plan of care. ? ?PT Short Term Goals ?Week 1:  PT Short Term Goal 1 (Week 1): Patient will perform basic transfer with CGA consistently. ?PT Short Term Goal 1 - Progress (Week 1): Met ?PT Short Term Goal 2 (Week 1): Patient will ambulate >100 feet using LRAD with CGA. ?PT Short Term Goal 2 - Progress (Week 1): Met ?PT Short Term Goal 3 (Week 1): Patient will perform 6 steps using R rail with min A. ?PT Short Term Goal 3 - Progress (Week 1):  Met ?Week 2:  PT Short Term Goal 1 (Week 2): STG=LTG due to ELOS. ? ?Skilled Therapeutic Interventions/Progress Updates:  ?   ?Session 1: ?Patient in bed upon PT arrival. Patient alert and agreeable to PT session. Patient denied pain during session. ? ?Therapeutic Activity: ?Bed Mobility: Patient performed supine to sit with mod I for increased time in a flat be without use of bed rails.  ?Transfers: Patient performed sit to/from stand x2 with supervision using RW. Provided verbal cues for reaching back to sit x1. ? ?Gait Training:  ?Patient ambulated 272 feet using RW with supervision. Ambulated with significantly decreased gait speed, decreased step length and height, forward trunk lean, and downward head gaze.Marland Kitchen Provided verbal cues for erect posture, safe proximity to RW, and leading with her heel to improve foot clearance and step length. ? ?Discussed d/c planning, d/c date, and setting up family education with patient and CSW during session.  ? ?Patient in recliner in the room at end of session with breaks locked, seat belt alarm set, and all needs within reach.  ? ?Session 2: ?Patient in bed upon PT arrival. Patient alert and agreeable to PT session. Patient denied pain during session. ? ?Therapeutic Activity: ?Bed Mobility: Patient performed supine to/from sit with mod I for increased time in a flat bed without use of bed rails.  ?Transfers: Patient performed sit to/from stand x7 with supervision with and without RW. Provided verbal cues for reaching back to sit x3. ?Patient performed an ambulatory transfer to/from the bathroom with distant supervision. Patient was continent of bowl and bladder during toileting, performed peri-care and lower body clothing management independently.  ? ?Gait Training:  ?Patient ambulated >100 feet to/from the day room with supervision,  as above, able to path find to each location without cues this afternoon. ?6 Min Walk Test:  ?Instructed patient to ambulate as quickly and as  safely as possible for 6 minutes using LRAD. Patient was allowed to take standing rest breaks without stopping the test, but if the patient required a sitting rest break the clock would be stopped and the test would be over.  ?Results: 280 feet (85.3 meters, Avg speed 0.2 m/s) using a RW with supervision with cues for safety. Results indicate that the patient has reduced endurance with ambulation compared to age matched norms.  ?Age Matched Norms: 60-89 yo F: 392 meters ?MDC: 58.21 meters (190.98 feet) or 50 meters ?(ANPTA Core Set of Outcome Measures for Adults with Neurologic Conditions, 2018) ? ?Neuromuscular Re-ed: ?Berg Balance Test ?Sit to Stand: Able to stand  independently using hands ?Standing Unsupported: Able to stand safely 2 minutes ?Sitting with Back Unsupported but Feet Supported on Floor or Stool: Able to sit safely and securely 2 minutes ?Stand to Sit: Controls descent by using hands ?Transfers: Able to transfer safely, definite need of hands ?Standing Unsupported with Eyes Closed: Able to stand 10 seconds safely ?Standing Ubsupported with Feet Together: Able to place feet together independently and stand for 1 minute with supervision ?From Standing, Reach Forward with Outstretched Arm: Can reach forward >12 cm safely (5") ?From Standing Position, Pick up Object from Floor: Able to pick up shoe, needs supervision ?From Standing Position, Turn to Look Behind Over each Shoulder: Turn sideways only but maintains balance ?Turn 360 Degrees: Able to turn 360 degrees safely but slowly ?Standing Unsupported, Alternately Place Feet on Step/Stool: Able to complete >2 steps/needs minimal assist ?Standing Unsupported, One Foot in Front: Needs help to step but can hold 15 seconds ?Standing on One Leg: Tries to lift leg/unable to hold 3 seconds but remains standing independently ?Total Score: 37/56 (increased from 13/56 on 3/14) ?Patient demonstrated increased fall risk noted by score of 37/56 on the [facility]  Scale.  ?<45/56 = fall risk, <42/56 = predictive of recurrent falls, <40/56 = 100% fall risk  ?>41 = independent, 21-40 = assistive device, 0-20 = wheelchair level  ?MDC 6.9 (4 pts 45-56, 5 pts 35-44, 7 pts 25-34) ?(ANPTA Core Set of Outcome Measures for Adults with Neurologic Conditions, 2018) ? ?Activities-specific Balance Confidence Scale:  ?Score: 22% ?Increased risk of falls in community-dwelling, older adults <80% (79.89%) ? 0% = no confidence - 100% = complete confidence ?(ANPTA Core Set of Outcome Measures for Adults with Neurologic Conditions, 2018) ? ?Patient in bed at end of session with breaks locked, bed alarm set, and all needs within reach.  ? ?Therapy Documentation ?Precautions:  ?Precautions ?Precautions: Fall ?Restrictions ?Weight Bearing Restrictions: No ? ? ? ?Therapy/Group: Individual Therapy ? ?Doreene Burke PT, DPT ? ?01/02/2022, 4:42 PM  ?

## 2022-01-02 NOTE — Progress Notes (Signed)
Nutrition Follow-up ? ?DOCUMENTATION CODES:  ? ?Not applicable ? ?INTERVENTION:  ?Continue Ensure Max po once daily, each supplement provides 150 kcal and 30 grams of protein.  ? ?Encourage adequate PO intake.  ? ?NUTRITION DIAGNOSIS:  ? ?Increased nutrient needs related to chronic illness (CHF) as evidenced by estimated needs; ongoing ? ?GOAL:  ? ?Patient will meet greater than or equal to 90% of their needs; progressing ? ?MONITOR:  ? ?PO intake, Supplement acceptance, Diet advancement, Labs, Weight trends, Skin, I & O's ? ?REASON FOR ASSESSMENT:  ? ?Consult ?Diet education, Assessment of nutrition requirement/status ? ?ASSESSMENT:  ? ?86 year old female with history of HTN, chronic dCHF, breast cancer, SVT who was initially admitted after fall striking back of head with syncope. CT head done revealing 1.3 cm R-SDH along falx and right tentorium with 1 cm leftward midline shift and moderate size occipital scalp hematoma. She was taken to OR for right crani for evacuation of SDH. Therapy ongoing and patient limited by weakness, balance deficits and need increased time for processing. CIR admitted due to functional decline. ? ?Meal completion has been 50-100%. Pt currently has Ensure max ordered and has been consuming them. RD to continue with current orders to aid in caloric and protein needs. Labs and medications reviewed.  ? ?Diet Order:   ?Diet Order   ? ?       ?  DIET DYS 3 Room service appropriate? Yes; Fluid consistency: Thin  Diet effective now       ?  ? ?  ?  ? ?  ? ? ?EDUCATION NEEDS:  ? ?Not appropriate for education at this time ? ?Skin:  Skin Assessment: Reviewed RN Assessment ?Skin Integrity Issues:: Incisions ?Incisions: head ? ?Last BM:  3/19 ? ?Height:  ? ?Ht Readings from Last 1 Encounters:  ?12/25/21 '5\' 2"'$  (1.575 m)  ? ? ?Weight:  ? ?Wt Readings from Last 1 Encounters:  ?01/02/22 73.2 kg  ? ?BMI:  Body mass index is 29.52 kg/m?. ? ?Estimated Nutritional Needs:  ? ?Kcal:  1750-1900 ? ?Protein:   85-95 grams ? ?Fluid:  >/= 1.7 L/day ? ?Corrin Parker, MS, RD, LDN ?RD pager number/after hours weekend pager number on Am[facility]. ? ?

## 2022-01-03 ENCOUNTER — Telehealth: Payer: Self-pay | Admitting: Cardiovascular Disease

## 2022-01-03 DIAGNOSIS — S065XAA Traumatic subdural hemorrhage with loss of consciousness status unknown, initial encounter: Secondary | ICD-10-CM | POA: Diagnosis not present

## 2022-01-03 NOTE — Telephone Encounter (Signed)
Patient husband calling for instructions & questions regarding heart monitor. Patient in rehab will be home on Friday, please assist. ?

## 2022-01-03 NOTE — Progress Notes (Addendum)
Patient ID: Kayla Gray, female   DOB: 02-07-1936, 86 y.o.   MRN: [id number] ? ?SW waiting on updates from Angie/[facility] Nanine Means); Amy/[facility] waiting to hear if able to have a delay in start of care as option if branch will allow.  ? ?SW updated medical team on challenges with updating [facility] requesting HEP at d/c, and SW will discuss with family about outpatient therapy tomorrow since there are transportation challenges.  ? ?Denied HHAs ?Jason/[facility] ?Cory/[facility] ?[facility]- no disciplines in area ?[facility]- no disciplines available in area ?Carolyn/[facility]- not in network ?Britney/[facility]- not in contract ?Jason/[facility]- out over 1 wk PT, and PT/OT/SLP/aide out 2 weeks for services to begin. ?Cheryl/[facility]- not in network with insurance ?Jennifer/[facility]- no staffing in area ?Angie/[facility]- not in network with insurance ? ?Loralee Pacas, MSW, LCSWA ?Office: [phone] ?Cell: [phone] ?Fax: [phone]  ?

## 2022-01-03 NOTE — Progress Notes (Signed)
Occupational Therapy TBI Note ? ?Patient Details  ?Name: Kayla Gray ?MRN: [id number] ?Date of Birth: 1936-10-06 ? ?Today's Date: 01/03/2022 ?OT Individual Time: 4801-6553 ?OT Individual Time Calculation (min): 75 min  ? ?Today's Date: 01/03/2022 ?OT Individual Time: 7482-7078 ?OT Individual Time Calculation (min): 30 min  ? ?Short Term Goals: ?Week 1:  OT Short Term Goal 1 (Week 1): Pt will complete ambualtory toilet transfers iwht CGA ?OT Short Term Goal 1 - Progress (Week 1): Met ?OT Short Term Goal 2 (Week 1): Pt will complete TTB transfer with CGA and min cuing for sequencing ?OT Short Term Goal 2 - Progress (Week 1): Met ?OT Short Term Goal 3 (Week 1): Pt will complete ADL with no grip slips out of LUE to demo improved attention, grasp and coordination ?OT Short Term Goal 3 - Progress (Week 1): Met ?OT Short Term Goal 4 (Week 1): Pt will groom in standing wiht CGA ?OT Short Term Goal 4 - Progress (Week 1): Met ? ?Skilled Therapeutic Interventions/Progress Updates:  ?  Session 1: ? ?Pt received in bed with no pain ?ADL: ?Pt completes ADL at overall supervision Level. Skilled interventions include: cuing for sitting for majority of shower as pt relies on grab bars in standing and does not have them in the tub, seated donning of shirt and hemi strategies for LB dressing.pt able to use reacher with no cuing to don underwear over feet with ease! Pt grooms in standing at sink with setup. ? ?Donned teds and shoes at EOB and amb to tub room with S using RW. Pt practices TTB and recliner 2x each with supervision. Cuing during functional mobility inhallway to improve pace of stepping and only time pt actually followed cuign was to avoid egress doors closing on pt.  ? ?PT sits in recliner to self feed with tray set up for L attention and no overt s/s of aspiration or cuing needed. Hallway door left open for selective attention to tray with pt often distracted by people walking by, but able to redirect self to meal  appropriately without cuing.  ? ?Pt left at end of session in recliner with exit alarm on, call light in reach and all needs met ? ?Session 2: ? ?Pt received in bed with no pain and already toileted.  ?Therapeutic activity ?Pt completes functional mobility to/from ADL apratment iht VC for forward gaze and increased pace. Pt makes PB&J with approriate steps but demo diffcituly gathering items in organized fashion increasing effort and trips to work station. Pt also demo AD abadonment at time and would benefit form walker tray or bag for safe mobility. ? ?Pt left at end of session in bed with exit alarm on, call light in reach and all needs met ? ? ?Therapy Documentation ?Precautions:  ?Precautions ?Precautions: Fall ?Restrictions ?Weight Bearing Restrictions: No ?General: ?  ? ? ?Therapy/Group: Individual Therapy ? ?Lowella Dell Ryleeann Urquiza ?01/03/2022, 6:45 AM ?

## 2022-01-03 NOTE — Progress Notes (Signed)
Speech Language Pathology Daily Session Note ? ?Patient Details  ?Name: Kayla Gray ?MRN: [id number] ?Date of Birth: 08-Mar-1936 ? ?Today's Date: 01/03/2022 ?SLP Individual Time: 4628-6381 ?SLP Individual Time Calculation (min): 30 min ? ?Short Term Goals: ?Week 2: SLP Short Term Goal 1 (Week 2): STGs=LTGs due to ELOS ? ?Skilled Therapeutic Interventions: Skilled treatment session focused on cognitive goals. SLP facilitated session by  re-administering the Cognistat. Patient scored WFL on all subtests with the exception of mild deficits in calculations which the patient states is baseline. SLP also facilitated session by providing memory compensatory strategies in how to incorporate strategies at home. Patient verbalized understanding. Patient left upright in bed with alarm on and all needs within reach. Continue with current plan of care.  ? ?   ? ?Pain ?No/Denies Pain  ? ?Therapy/Group: Individual Therapy ? ?[name] ?01/03/2022, 12:34 PM ?

## 2022-01-03 NOTE — Progress Notes (Signed)
Physical Therapy Session Note ? ?Patient Details  ?Name: Kayla Gray ?MRN: [id number] ?Date of Birth: 03-07-1936 ? ?Today's Date: 01/03/2022 ?PT Individual Time: 0165-5374 ?PT Individual Time Calculation (min): 75 min  ? ?Short Term Goals: ?Week 2:  PT Short Term Goal 1 (Week 2): STG=LTG due to ELOS. ? ?Skilled Therapeutic Interventions/Progress Updates:  ?   ?Patient in bed upon PT arrival. Patient alert and agreeable to PT session. Patient denied pain during session. ? ?Focused session on d/c assessment, see d/c note, and assessment of functional mobility in preparation for family education prior to d/c.  ? ?Therapeutic Activity: ?Bed Mobility: Patient performed supine to/from sit independently.  ?Transfers: Patient performed sit to/from stand with mod I and stand pivot with supervision for min cues to complete turns using RW throughout session. ?Patient performed a simulated sedan height car transfer with supervision using RW. Provided cues for safe technique. ? ?Gait Training:  ?Patient ambulated >150 feet, >100 feet, and >250 feet using RW with supervision. Ambulated with significantly decreased gait speed, decreased step length and height, forward trunk lean, and downward head gaze.Marland Kitchen Provided verbal cues for erect posture, safe proximity to RW x2, increased gait speed, and leading with her heel to improve foot clearance and step length. ?Patient ascended/descended 12x6" steps using R HR with close supervision. Performed step-to gait pattern throughout. Provided min cues for technique and sequencing.  ?Patient ambulated up/down a ramp, over 10 feet of mulch (unlevel surface), and up/down a curb to simulate community ambulation over unlevel surfaces with CGA using RW. Provided cues for technique and use of AD. ? ?Educated patient on fall risk/prevention, home modifications to prevent falls, and activation of emergency services in the event of a fall during session. Patient with appropriate questions regarding  residual deficits and symptoms following TBI. Provided verbal education and CIR TBI handbook for additional education for patient to review. Patient appreciative.  ? ?Patient in bed due to fatigue at end of session with breaks locked, bed alarm set, and all needs within reach.  ? ?Therapy Documentation ?Precautions:  ?Precautions ?Precautions: Fall ?Restrictions ?Weight Bearing Restrictions: No ? ? ? ?Therapy/Group: Individual Therapy ? ?Doreene Burke PT, DPT ? ?01/03/2022, 4:27 PM  ?

## 2022-01-03 NOTE — Progress Notes (Signed)
Physical Therapy Discharge Summary ? ?Patient Details  ?Name: Kayla Gray ?MRN: [id number] ?Date of Birth: 1936/07/07 ? ?Today's Date: 01/03/2022 ? ? ?Patient has met 10 of 10 long term goals due to improved activity tolerance, improved balance, improved postural control, increased strength, ability to compensate for deficits, improved attention, improved awareness, and improved coordination.  Patient to discharge at an ambulatory level Supervision with RW.   Patient's care partner  has been provided with amble written instructions and verbal instructions to be passed on from patient's sister who reports the patient will have  the necessary cognitive assistance at discharge. ? ?Reasons goals not met: All PT goals met at this time. ? ?Recommendation:  ?Patient will benefit from ongoing skilled PT services in home health setting to continue to advance safe functional mobility, address ongoing impairments in balance, functional mobility, activity tolerance, gait and stair, community integration, patient/caregiver education, and minimize fall risk. ? ?Equipment: ?Recommended: RW  ? ?Reasons for discharge: treatment goals met ? ?Patient/family agrees with progress made and goals achieved: Yes ? ?PT Discharge ?Precautions/Restrictions ?Precautions ?Precautions: Fall ?Restrictions ?Weight Bearing Restrictions: No ?Pain Interference ?Pain Interference ?Pain Effect on Sleep: 1. Rarely or not at all ?Pain Interference with Therapy Activities: 1. Rarely or not at all ?Pain Interference with Day-to-Day Activities: 2. Occasionally ?Vision/Perception  ?Vision - History ?Ability to See in Adequate Light: 0 Adequate ?Vision - Assessment ?Eye Alignment: Within Functional Limits ?Ocular Range of Motion: Within Functional Limits ?Alignment/Gaze Preference: Within Defined Limits ?Tracking/Visual Pursuits: Able to track stimulus in all quads without difficulty ?Saccades: Decreased speed of saccadic movement ?Perception ?Perception:  Impaired ?Inattention/Neglect: Does not attend to left visual field ?Praxis ?Praxis: Intact  ?Cognition ?Overall Cognitive Status: Impaired/Different from baseline ?Arousal/Alertness: Awake/alert ?Orientation Level: Oriented X4 ?Attention: Selective ?Selective Attention: Impaired ?Selective Attention Impairment: Functional basic;Verbal basic ?Memory: Impaired ?Memory Impairment: Retrieval deficit;Decreased recall of new information;Decreased short term memory ?Decreased Short Term Memory: Verbal basic;Functional basic ?Awareness: Impaired ?Awareness Impairment: Anticipatory impairment ?Safety/Judgment: Impaired ?[place] [employer] Scales of Cognitive Functioning: Purposeful/appropriate ?Sensation ?Sensation ?Light Touch: Appears Intact ?Proprioception: Appears Intact ?Coordination ?Gross Motor Movements are Fluid and Coordinated: Yes ?Fine Motor Movements are Fluid and Coordinated: Yes ?Heel Shin Test: slow deliberate within available range bilaterally ?Motor  ?Motor ?Motor: Hemiplegia;Abnormal postural alignment and control ?Motor - Discharge Observations: Mild L inattention and hemiplegia UE>LE, decreased postural control  ?Mobility ?Bed Mobility ?Bed Mobility: Rolling Right;Rolling Left;Sit to Supine;Supine to Sit ?Rolling Right: Independent ?Rolling Left: Independent ?Supine to Sit: Independent ?Sit to Supine: Independent ?Transfers ?Transfers: Sit to Stand;Stand to [employer] Transfers ?Sit to Stand: Independent with assistive device ?Stand to Sit: Independent with assistive device ?Stand Pivot Transfers: Supervision/Verbal cueing ?Stand Pivot Transfer Details: Verbal cues for precautions/safety ?Transfer (Assistive device): Rolling walker ?Locomotion  ?Gait ?Gait Assistance: Supervision/Verbal cueing ?Gait Distance (Feet): 280 Feet (during 6MWT) ?Assistive device: Rolling walker ?Gait ?Gait: Yes ?Gait Pattern: Impaired ?Gait Pattern: Decreased stride length;Decreased trunk rotation;Narrow base of  support;Trunk flexed;Poor foot clearance - left ?Gait velocity: 0.2 m/s avg on 6MWT ?Stairs / Additional Locomotion ?Stairs: Yes ?Stairs Assistance: Contact Guard/Touching assist ?Stair Management Technique: One rail Right ?Number of Stairs: 12 ?Height of Stairs: 6 ?Wheelchair Mobility ?Wheelchair Mobility: No  ?Trunk/Postural Assessment  ?Cervical Assessment ?Cervical Assessment:  (head forward) ?Thoracic Assessment ?Thoracic Assessment:  (rounded shoulders) ?Lumbar Assessment ?Lumbar Assessment: Within Functional Limits ?Postural Control ?Postural Control: Deficits on evaluation (delayed L)  ?Balance ?Berg Balance Test ?Sit to Stand: Able to stand  independently using  hands ?Standing Unsupported: Able to stand safely 2 minutes ?Sitting with Back Unsupported but Feet Supported on Floor or Stool: Able to sit safely and securely 2 minutes ?Stand to Sit: Controls descent by using hands ?Transfers: Able to transfer safely, definite need of hands ?Standing Unsupported with Eyes Closed: Able to stand 10 seconds safely ?Standing Ubsupported with Feet Together: Able to place feet together independently and stand for 1 minute with supervision ?From Standing, Reach Forward with Outstretched Arm: Can reach forward >12 cm safely (5") ?From Standing Position, Pick up Object from Floor: Able to pick up shoe, needs supervision ?From Standing Position, Turn to Look Behind Over each Shoulder: Turn sideways only but maintains balance ?Turn 360 Degrees: Able to turn 360 degrees safely but slowly ?Standing Unsupported, Alternately Place Feet on Step/Stool: Able to complete >2 steps/needs minimal assist ?Standing Unsupported, One Foot in Front: Needs help to step but can hold 15 seconds ?Standing on One Leg: Tries to lift leg/unable to hold 3 seconds but remains standing independently ?Total Score: 37 ?Dynamic Sitting Balance ?Dynamic Sitting - Balance Support: No upper extremity supported ?Dynamic Sitting - Level of Assistance: 7:  Independent ?Static Standing Balance ?Static Standing - Balance Support: No upper extremity supported ?Static Standing - Level of Assistance: 7: Independent ?Dynamic Standing Balance ?Dynamic Standing - Balance Support: During functional activity;Right upper extremity supported;Left upper extremity supported ?Dynamic Standing - Level of Assistance: 5: Stand by assistance ?Extremity Assessment  ?RLE Assessment ?RLE Assessment: Within Functional Limits ?Active Range of Motion (AROM) Comments: Grossly WFL for all functional mobility ?General Strength Comments: Grossy 4+ to 5/5 throughout in sitting ?LLE Assessment ?LLE Assessment: Within Functional Limits ?Active Range of Motion (AROM) Comments: Grossly WFL for all functional mobility ?General Strength Comments: Grossy 4+ to 5/5 throughout in sitting ? ? ? ?Doreene Burke PT, DPT ? ?01/04/2022, 4:40 PM ?

## 2022-01-04 MED ORDER — PANTOPRAZOLE 2 MG/ML SUSPENSION
40.0000 mg | Freq: Every day | ORAL | Status: DC
  Administered 2022-01-05: 40 mg via ORAL
  Filled 2022-01-04: qty 20

## 2022-01-04 NOTE — Progress Notes (Addendum)
Physical Therapy Session Note ? ?Patient Details  ?Name: Kayla Gray ?MRN: [id number] ?Date of Birth: 05-Oct-1936 ? ?Today's Date: 01/04/2022 ?PT Individual Time: 4098-1191 ?PT Individual Time Calculation (min): 15 min  ? ?Short Term Goals: ?Week 2:  PT Short Term Goal 1 (Week 2): STG=LTG due to ELOS. ? ?Skilled Therapeutic Interventions/Progress Updates:  ?   ?Patient in bed asleep upon PT arrival. Patient easily aroused and initially agreeable to PT session. Patient reported 7/10 headache pain during session, RN made aware. PT provided repositioning, rest breaks, and distraction as pain interventions throughout session.  ? ?Patient reported that family education went well this morning and she feels prepared to return home tomorrow. Provided handout for daily walking program progressing from 5 min 2x/day up to 3x/day and increasing by 2 min every week as tolerated. Handouts provided for patient and family.  ? ?Patient reports significant fatigue and headache this afternoon following back to back sessions and increased "excitement" this morning. Educated on BI symptoms related to reduced energy reserve and management of headache. Patient appropriately requested tylenol and rest at this time, RN made aware.  ? ?Patient in bed at end of session with breaks locked, bed alarm set, and all needs within reach. Patient missed 45 min of skilled PT due to pain/fatigue, RN made aware. Will attempt to make-up missed time as able.   ? ?Therapy Documentation ?Precautions:  ?Precautions ?Precautions: Fall ?Restrictions ?Weight Bearing Restrictions: No ? ? ? ?Therapy/Group: Individual Therapy ? ?Doreene Burke PT, DPT ? ?01/04/2022, 1:01 PM  ?

## 2022-01-04 NOTE — Progress Notes (Signed)
Inpatient Rehabilitation Discharge Medication Review by a Pharmacist ? ?A complete drug regimen review was completed for this patient to identify any potential clinically significant medication issues. ? ?High Risk Drug Classes Is patient taking? Indication by Medication  ?Antipsychotic No   ?Anticoagulant No   ?Antibiotic No   ?Opioid No   ?Antiplatelet No   ?Hypoglycemics/insulin No   ?Vasoactive Medication Yes Losartan, metoprolol for BP  ?Chemotherapy No   ?Other Yes Paxil for mood ?Protonix for GERD ?Zetia for HLD ?Ditropan for incontinence  ? ? ? ?Type of Medication Issue Identified Description of Issue Recommendation(s)  ?Drug Interaction(s) (clinically significant) ?    ?Duplicate Therapy ?    ?Allergy ?    ?No Medication Administration End Date ?    ?Incorrect Dose ?    ?Additional Drug Therapy Needed ?    ?Significant med changes from prior encounter (inform family/care partners about these prior to discharge).    ?Other ?    ? ? ?Clinically significant medication issues were identified that warrant physician communication and completion of prescribed/recommended actions by midnight of the next day:  No ? ?Pharmacist comments: None ? ?Time spent performing this drug regimen review (minutes):  20 minutes ? ? ?Tad Moore ?01/04/2022 8:46 AM ?

## 2022-01-04 NOTE — Progress Notes (Signed)
Occupational Therapy TBI Note ? ?Patient Details  ?Name: Kayla Gray ?MRN: [id number] ?Date of Birth: Sep 18, 1936 ? ?Today's Date: 01/04/2022 ?OT Individual Time: 1015-1100 ?OT Individual Time Calculation (min): 45 min  ? ? ?Short Term Goals: ?Week 1:  OT Short Term Goal 1 (Week 1): Pt will complete ambualtory toilet transfers iwht CGA ?OT Short Term Goal 1 - Progress (Week 1): Met ?OT Short Term Goal 2 (Week 1): Pt will complete TTB transfer with CGA and min cuing for sequencing ?OT Short Term Goal 2 - Progress (Week 1): Met ?OT Short Term Goal 3 (Week 1): Pt will complete ADL with no grip slips out of LUE to demo improved attention, grasp and coordination ?OT Short Term Goal 3 - Progress (Week 1): Met ?OT Short Term Goal 4 (Week 1): Pt will groom in standing wiht CGA ?OT Short Term Goal 4 - Progress (Week 1): Met ? ?Skilled Therapeutic Interventions/Progress Updates:  ?  FAMILY EDUCATION ?Pt received in bed with sister Kayla Gray present. Sister reporting daughter will be finding paid caregiver to stay with pt and husband. TTB ordered and demoed full shower in tub room. Pt sister will not be providing A but observed, took notes and was provided handout with this information that was also reviewed. Pt completes ADL at supervision level with pt sister verbalizing understanding for cognitive deficits and implications especially with impaired memory, organization and dual task.  ? ?General mobility- they should always use their RW. They may benefit from a walker tray to transport items from room to room if walking with a walker is recommended by physical therapy. The walker should be kept within reach so they can pull it close to get up and keep with them to back up to any surface they want to sit on. When getting up, they should push up from the surface they are getting up from and reach back when sitting to a new surface, no plopping.  ?You are their ?shadow.? Especially in the beginning. You, as the helper should be  in reach of the patient when mobilizing. You should be either beside or behind them so if they lose their balance you can assist by helping correct at the hips. This is likely closer than you are used to being- be in their personal space. Use a gait belt if that makes you feel more comfortable ?If you are attempting to get up/transfer and it is not going well, reset. Have them sit back down. Make sure they are close to the edge of the seat, feet are underneath them at hips distance, and they are leaning forward to stand up. ?Bathing- they should sit to bathe on a shower chair, especially for washing legs/feet. Sitting will save energy and increase safety. ?For a tub shower with shower chair: Use the walker to walk up to shower/tub edge and leave it to the side, but close. they can use the wall to steady as they step over or a grab bar. Do your best to dry off the floor prior to getting out of the shower ?For tub shower with Tub bench: use walker to get to the edge of the tub bench, back up to the edge, reach back prior to sitting down. Turn to swing legs into tub and scoot across. Reverse to exit the tub. Make sure both legs are out of the tub prior to standing to exit the bathroom ?Walk in shower: walk up to the shower ledge, turn around and back up to the ledge  with the walker. Keep both hands-on walker while they step back one foot at a time ?Dressing- all should be done from a SEATED level, especially to put underwear and pants over feet.  ?Toileting- the RW can be walked right over the toilet for standing urination if applicable. If seated toileting is more appropriate, have them walk up to the toilet and keep walker with them as they turn to sit to toilet or BSC. Before they stand to pull up pants past hips they should pull pants/underwear up past their knees to decrease the need to bend forward to the floor. Sometimes this makes people dizzy ?if incontinence/bathroom accidents are an issue attempt to toilet  every 2-3 hours to improve success with toileting and decrease accidents.  ?Energy conservation principles- ?Prioritize what needs to be done and what can be moved to another day ?Plan out their days, weeks, months to spread out taxing (physical or cognitively tiring) activities to not put too much at one time ?Pace activities- rest before feeling tired and have designated places to rest if they feel tired and need to take a brake ?Position for success: sit when able to conserve 25% more energy than standing  ? ?Pt left at end of session in w/c with exit alarm on, call light in reach and all needs met ? ? ?Therapy Documentation ?Precautions:  ?Precautions ?Precautions: Fall ?Restrictions ?Weight Bearing Restrictions: No ?General: ?  ? ?  ?Agitated Behavior Scale: ?TBI discontinued d/t non agitated behavior ?  ?  ? ? ? ?Therapy/Group: Individual Therapy ? ?Kayla Gray ?01/04/2022, 7:06 AM ?

## 2022-01-04 NOTE — Telephone Encounter (Signed)
Spoke with patients spouse per release form. He is just not able to place the monitor as it is confusing. We then discussed scheduling a follow up appointment. He states that he is not sure about her mobility and ease of bringing her in to be seen. Then he spoke about traffic and driving to get here.  ? ?Instructed him to mail monitor back to company. Advised that once she gets home to give [place] a call and we can try to schedule her for appointment. This will allow for him to determine if he can bring her based on her mobility. He was agreeable with this plan and had no further questions at this time.  ? ?Provider made aware of barriers for them at this time. Advised they would call back for appointment once they determine if this is possible.  ?

## 2022-01-04 NOTE — Progress Notes (Signed)
Physical Therapy Session Note ? ?Patient Details  ?Name: Kayla Gray ?MRN: [id number] ?Date of Birth: 11/15/1935 ? ?Today's Date: 01/04/2022 ?PT Individual Time: 2395-3202 ?PT Individual Time Calculation (min): 45 min  ? ?Short Term Goals: ?Week 1:  PT Short Term Goal 1 (Week 1): Patient will perform basic transfer with CGA consistently. ?PT Short Term Goal 1 - Progress (Week 1): Met ?PT Short Term Goal 2 (Week 1): Patient will ambulate >100 feet using LRAD with CGA. ?PT Short Term Goal 2 - Progress (Week 1): Met ?PT Short Term Goal 3 (Week 1): Patient will perform 6 steps using R rail with min A. ?PT Short Term Goal 3 - Progress (Week 1): Met ?Week 2:  PT Short Term Goal 1 (Week 2): STG=LTG due to ELOS. ?Week 3:    ? ?Skilled Therapeutic Interventions/Progress Updates:  ?  Pt initially supine, sister attending for scheduled family training. ? ?Discussed all of the following: ?Importance of gradual increase in walking activity w/supervision.  Sister reports pts daughter looking into hired home assist.  Therapist reinforced this idea due to pt fall risk/deficits/safety. ? ?[place] A balance exercises, pt performed all standing at sink, provided w/written program. Discuessed need for assist w/stair exercises for safety. ? ?Discussed Berg Balance Score 37, need for use of RW at all times, importance of supervision, fall risk, increased risk of injury/TBI, importance of calling 911 if fall occurs, possible use of LifeAlert. ? ?Recommendation for HHPT at DC for continued skilled PT services ? ? ?Mobility ? supine to sit w/supervision ?Sit to stand from bed/commode/chair w/suprevision to RW ?Gait:  >238f w/RW w/supervision, gait speed decreased w/fatigue but continues to demonstrate clearance of LLE. ?Commode transfer and toileting w/supervision, pt continent of bowels.   ?Stood at sink w/supervision to wash hands. ?Ambulatory transfer to bed w/supervision w/RW. ?Sit to supine w/supervision ? ?Sister requested written  information re: walking program.  Will discuss w/primary. ? ? ?Therapy Documentation ?Precautions:  ?Precautions ?Precautions: Fall ?Restrictions ?Weight Bearing Restrictions: No ? ? ? ?Therapy/Group: Individual Therapy ?BCallie Fielding PT ? ? ?BJerrilyn Cairo?01/04/2022, 10:28 AM  ?

## 2022-01-04 NOTE — Progress Notes (Signed)
Occupational Therapy Discharge Summary ? ?Patient Details  ?Name: Kayla Gray ?MRN: [id number] ?Date of Birth: 12-Jan-1936 ? ?Patient has met 14 of 14 long term goals due to improved activity tolerance, improved balance, postural control, ability to compensate for deficits, functional use of  LEFT upper and LEFT lower extremity, improved attention, improved awareness, and improved coordination.  Patient to discharge at overall Supervision level.  Patient's daughter is working to hire paid caregiver to provide supervision. Written DC instructions provided to share with caregiver and daughter. Sister, Kayla Gray, present for family education and aware of memory, organization, problem solving and dual task deficits impacting safety with walker during BADLs/transfers    ? ?Reasons goals not met: n/a ? ?Recommendation:  ?Patient will benefit from ongoing skilled OT services in home health setting to continue to advance functional skills in the area of BADL, iADL, and Reduce care partner burden. ? ?Equipment: ?TTB ? ?Reasons for discharge: treatment goals met and discharge from hospital ? ?Patient/family agrees with progress made and goals achieved: Yes ? ?OT Discharge ?Precautions/Restrictions  ?Precautions ?Precautions: Fall ?Restrictions ?Weight Bearing Restrictions: No ?General ?  ?Vital Signs ?Therapy Vitals ?Temp: 98.8 ?F (37.1 ?C) ?Temp Source: Oral ?Pulse Rate: (!) 101 ?Resp: 16 ?BP: 122/82 ?Patient Position (if appropriate): Orthostatic Vitals ?Oxygen Therapy ?SpO2: 97 % ?O2 Device: Room Air ?Pain ?  ?ADL ?ADL ?Equipment Provided: Reacher (supervision for ADLs) ?Eating: Supervision/safety ?Grooming: Supervision/safety ?Where Assessed-Grooming: Standing at sink ?Upper Body Bathing: Supervision/safety ?Where Assessed-Upper Body Bathing: Shower ?Lower Body Bathing: Supervision/safety ?Where Assessed-Lower Body Bathing: Shower ?Upper Body Dressing: Supervision/safety ?Where Assessed-Upper Body Dressing: Standing at  sink ?Lower Body Dressing: Minimal assistance ?Where Assessed-Lower Body Dressing: Other (Comment) (toilet (A over L toes with underwear)) ?Toileting: Supervision/safety ?Where Assessed-Toileting: Toilet ?Toilet Transfer: Contact guard ?Toilet Transfer Method: Ambulating ?Walk-In Shower Transfer: Contact guard ?Walk-In Shower Transfer Method: Ambulating ?Walk-In Shower Equipment: Grab bars, Transfer tub bench ?Vision ?Baseline Vision/History: 1 Wears glasses ?Patient Visual Report: No change from baseline ?Vision Assessment?: Yes ?Eye Alignment: Within Functional Limits ?Ocular Range of Motion: Within Functional Limits ?Alignment/Gaze Preference: Within Defined Limits ?Tracking/Visual Pursuits: Able to track stimulus in all quads without difficulty ?Saccades: Decreased speed of saccadic movement ?Visual Fields: No apparent deficits ?Perception  ?Perception: Impaired ?Inattention/Neglect: Does not attend to left visual field ?Praxis ?Praxis: Intact ?Cognition ?Cognition ?Overall Cognitive Status: Impaired/Different from baseline ?Arousal/Alertness: Awake/alert ?Memory: Impaired ?Memory Impairment: Retrieval deficit;Decreased recall of new information;Decreased short term memory ?Decreased Short Term Memory: Verbal basic;Functional basic ?Attention: Selective ?Selective Attention: Impaired ?Selective Attention Impairment: Functional complex ?Awareness: Impaired ?Awareness Impairment: Anticipatory impairment ?Problem Solving: Impaired ?Problem Solving Impairment: Functional complex ?Reasoning: Appears intact ?Organizing: Appears intact ?Safety/Judgment: Impaired ?R[place] [employer] Scales of Cognitive Functioning: Purposeful/appropriate ?Brief Interview for Mental Status (BIMS) ?Repetition of Three Words (First Attempt): 3 ?Temporal Orientation: Year: Correct ?Temporal Orientation: Month: Accurate within 5 days ?Temporal Orientation: Day: Correct ?Recall: "Sock": No, could not recall ?Recall: "Blue": Yes, no cue  required ?Recall: "Bed": Yes, no cue required ?BIMS Summary Score: 13 ?Sensation ?Sensation ?Light Touch: Appears Intact ?Proprioception: Appears Intact ?Coordination ?Gross Motor Movements are Fluid and Coordinated: Yes ?Fine Motor Movements are Fluid and Coordinated: Yes ?Heel Shin Test: slow deliberate within available range bilaterally ?Motor  ?Motor ?Motor: Hemiplegia;Abnormal postural alignment and control ?Motor - Discharge Observations: Mild L inattention and hemiplegia UE>LE, decreased postural control ?Mobility  ?Transfers ?Sit to Stand: Independent with assistive device ?Stand to Sit: Independent with assistive device  ?Trunk/Postural Assessment  ?Lumbar Assessment ?Lumbar Assessment:  Within Functional Limits ?Postural Control ?Postural Control: Deficits on evaluation  ?Balance ?Balance ?Balance Assessed: Yes ?Static Standing Balance ?Static Standing - Balance Support: No upper extremity supported ?Static Standing - Level of Assistance: 7: Independent ?Dynamic Standing Balance ?Dynamic Standing - Level of Assistance: 5: Stand by assistance ?Extremity/Trunk Assessment ?RUE Assessment ?RUE Assessment: Within Functional Limits ?LUE Assessment ?LUE Assessment: Exceptions to [facility] ?General Strength Comments: generalized weakness and discoordination improved since eval but still minor deficits ? ? ?Lowella Dell Annalia Metzger ?01/04/2022, 9:01 AM ?

## 2022-01-04 NOTE — Telephone Encounter (Signed)
See other telephone encounter. ? ?Spoke with patients spouse per release form. He is just not able to place the monitor as it is confusing. We then discussed scheduling a follow up appointment. He states that he is not sure about her mobility and ease of bringing her in to be seen. Then he spoke about traffic and driving to get here.  ?  ?Instructed him to mail monitor back to company. Advised that once she gets home to give [place] a call and we can try to schedule her for appointment. This will allow for him to determine if he can bring her based on her mobility. He was agreeable with this plan and had no further questions at this time.  ?  ?Provider made aware of barriers for them at this time. Advised they would call back for appointment once they determine if this is possible.  ?

## 2022-01-04 NOTE — Progress Notes (Signed)
?                                                       PROGRESS NOTE ? ? ?Subjective/Complaints: ? ?Pt slept well. Excited about going home tomorrow! ? ?ROS: Patient denies fever, rash, sore throat, blurred vision, dizziness, nausea, vomiting, diarrhea, cough, shortness of breath or chest pain, joint or back/neck pain, headache, or mood change.  ? ?Objective: ?  ?No results found. ?No results for input(s): WBC, HGB, HCT, PLT in the last 72 hours. ? ? ?No results for input(s): NA, K, CL, CO2, GLUCOSE, BUN, CREATININE, CALCIUM in the last 72 hours. ? ? ? ?Intake/Output Summary (Last 24 hours) at 01/04/2022 1030 ?Last data filed at 01/04/2022 [phone] ?Gross per 24 hour  ?Intake 570 ml  ?Output --  ?Net 570 ml  ?  ? ?  ? ?Physical Exam: ?Vital Signs ?Blood pressure 122/82, pulse (!) 101, temperature 98.8 ?F (37.1 ?C), temperature source Oral, resp. rate 16, height '5\' 2"'$  (1.575 m), weight 73.1 kg, SpO2 97 %. ? ? ? ?Constitutional: No distress . Vital signs reviewed. ?HEENT: NCAT, EOMI, oral membranes moist ?Neck: supple ?Cardiovascular: RRR without murmur. No JVD    ?Respiratory/Chest: CTA Bilaterally without wheezes or rales. Normal effort    ?GI/Abdomen: BS +, non-tender, non-distended ?Ext: no clubbing, cyanosis, or edema ?Psych: pleasant and cooperative  ?Skin:scalp cdi with staples out ?Neuro:  Alert and oriented x 3. improving insight and awareness. STM deficits. Normal language and speech. Cranial nerve exam unremarkable. Mild left sided weakness. RUE and RLE 5/5. Sensory exam normal for light touch and pain in all 4 limbs. No limb ataxia or cerebellar signs. No abnormal tone appreciated.   ?Musculoskeletal: old TKA incisions, good knee ROM--stable ? ? ?Assessment/Plan: ?1. Functional deficits which require 3+ hours per day of interdisciplinary therapy in a comprehensive inpatient rehab setting. ?Physiatrist is providing close team supervision and 24 hour management of active medical problems listed  below. ?Physiatrist and rehab team continue to assess barriers to discharge/monitor patient progress toward functional and medical goals ? ?Care Tool: ? ?Bathing ?   ?Body parts bathed by patient: Right arm, Left arm, Chest, Abdomen, Front perineal area, Buttocks, Right upper leg, Left upper leg, Right lower leg, Left lower leg, Face  ? Body parts bathed by helper: Right lower leg, Left lower leg ?  ?  ?Bathing assist Assist Level: Supervision/Verbal cueing ?  ?  ?Upper Body Dressing/Undressing ?Upper body dressing   ?What is the patient wearing?: Pull over shirt ?   ?Upper body assist Assist Level: Supervision/Verbal cueing ?   ?Lower Body Dressing/Undressing ?Lower body dressing ? ? ?   ?What is the patient wearing?: Underwear/pull up, Pants ? ?  ? ?Lower body assist Assist for lower body dressing: Supervision/Verbal cueing ?   ? ?Toileting ?Toileting    ?Toileting assist Assist for toileting: Supervision/Verbal cueing ?Assistive Device Comment: walker ?  ?Transfers ?Chair/bed transfer ? ?Transfers assist ?   ? ?Chair/bed transfer assist level: Supervision/Verbal cueing ?Chair/bed transfer assistive device: Walker ?  ?Locomotion ?Ambulation ? ? ?Ambulation assist ? ?   ? ?Assist level: Supervision/Verbal cueing ?Assistive device: Walker-rolling ?Max distance: 280 ft  ? ?Walk 10 feet activity ? ? ?Assist ?   ? ?Assist level: Supervision/Verbal cueing ?Assistive device: Walker-rolling  ? ?Walk 50  feet activity ? ? ?Assist   ? ?Assist level: Supervision/Verbal cueing ?Assistive device: Walker-rolling  ? ? ?Walk 150 feet activity ? ? ?Assist Walk 150 feet activity did not occur: Safety/medical concerns ? ?Assist level: Supervision/Verbal cueing ?Assistive device: Walker-rolling ?  ? ?Walk 10 feet on uneven surface  ?activity ? ? ?Assist Walk 10 feet on uneven surfaces activity did not occur: Safety/medical concerns ? ? ?Assist level: Contact Guard/Touching assist ?Assistive device: Walker-rolling   ? ?Wheelchair ? ? ? ? ?Assist Is the patient using a wheelchair?: No ?Type of Wheelchair: Manual ?  ? ?Wheelchair assist level: Total Assistance - Patient < 25% ?Max wheelchair distance: 150 ft  ? ? ?Wheelchair 50 feet with 2 turns activity ? ? ? ?Assist ? ?  ?  ? ? ?Assist Level: Total Assistance - Patient < 25%  ? ?Wheelchair 150 feet activity  ? ? ? ?Assist ?   ? ? ?Assist Level: Total Assistance - Patient < 25%  ? ?Blood pressure 122/82, pulse (!) 101, temperature 98.8 ?F (37.1 ?C), temperature source Oral, resp. rate 16, height '5\' 2"'$  (1.575 m), weight 73.1 kg, SpO2 97 %. ? ?Medical Problem List and Plan: ?1. Functional deficits secondary to right SDH after fall. Pt s/p craniotomy ?            -patient may  shower ?            -ELOS/Goals: 3/24, supervision ? -Continue CIR therapies including PT, OT, and SLP  ?2.  Antithrombotics: ?-DVT/anticoagulation:  Pharmaceutical: Lovenox ?            -antiplatelet therapy: N/A due to bleed.  ?3. Pain Management: Tylenol prn.  ?4. Mood: LCSW to follow for evaluation and support.  ?            -antipsychotic agents: N/A ?5. Neuropsych: This patient is capable of making decisions on her own behalf. ?6. Skin/Wound Care:  staples out ?7. Fluids/Electrolytes/Nutrition: encourage PO ? -added protein ?8. HTN: Monitor BP TID--continue metoprolol and Losartan.  ? -bp controlled. HR a little elevated---obsv ?9. Chronic diastolic CHF: Low salt diet.  ?  ?Filed Weights  ? 01/02/22 0500 01/03/22 0500 01/04/22 0500  ?Weight: 73.2 kg 73 kg 73.1 kg  ?  ?  ?3/24 weights stable---can dc daily weights ?10. Pre-diabetes:  Hgb A1c-5.7. Added low carb restrictions.  ?-  well controlled. Dc'ed cbg's ?--RD   for diet education.  ?11. H/o SVT/ PAF: Monitor HR TID--continue metoprolol. Zio patch to be mailed to pt ?            -no issues, sx so far ?12. Seizure prophylaxis: Continue Keppra BID.  ?13. Hyperactive bladder: Now on ditropan.  ?            -pt reports baseline urinary frequency ? 3/21-  stable- no changes, emptying ?14. Post-op anemia ? -hgb stable at  9.8 3/20 ? -added fe supp ?   ?15. Dysphagia: ? --  oropharyngeal dysphagia tolerating D3/thins with provale cup to help limit fluid bolus ?   ? ?LOS: ?10 days ?A FACE TO FACE EVALUATION WAS PERFORMED ? ?Meredith Staggers ?01/04/2022, 10:30 AM  ? ?  ?

## 2022-01-04 NOTE — Progress Notes (Signed)
Patient ID: Kayla Gray, female   DOB: 03-22-1936, 86 y.o.   MRN: [id number] ? ? ?SW met with pt and pt sister in room to inform on HHA challenges and currently unable to secure a HHA. SW informed will continue to explore options through Monday, and if unable to secure, pt will have to go to outpatient. Pt and sister aware SW to follow-up with her dtr [name]. ? ?*SW spoke with [name] [phone]) to discuss above. She is aware there will be f/u by Monday to confirm if [facility] or outpatient.  ? ?Loralee Pacas, MSW, LCSWA ?Office: [phone] ?Cell: [phone] ?Fax: [phone]  ?

## 2022-01-04 NOTE — Progress Notes (Signed)
Speech Language Pathology Discharge Summary ? ?Patient Details  ?Name: Kayla Gray ?MRN: [id number] ?Date of Birth: 1936/08/13 ? ?Today's Date: 01/04/2022 ?SLP Individual Time: 4784-1282 ?SLP Individual Time Calculation (min): 30 min ? ? ?Skilled Therapeutic Interventions:  Skilled treatment session focused on completion of family education with the patient and her sister. SLP facilitated session by providing education regarding patient's current swallowing function, diet recommendations, appropriate textures, swallowing compensatory strategies, and medication administration. SLP also facilitated session by providing education regarding patient's current cognitive functioning and strategies to utilize at home to maximize recall, attention, problem solving and overall safety. Handouts were also given to reinforce information. Both verbalized understanding and all questions were answered. Patient left upright in bed with alarm on. Continue with current plan of care.  ? ?Patient has met 7 of 7 long term goals.  Patient to discharge at overall Supervision;Min level.  ? ?Reasons goals not met: N/A ? ?Clinical Impression/Discharge Summary: Patient has made functional gains and has met 7 of 7 LTGs this admission. Currently, patient demonstrates behaviors consistent with a Rancho Level VIII and requires overall supervision level verbal cues to complete functional and familiar task safely in regards to selective attention and problem solving. Increased cueing is needed with complex problem solving, emergent awareness and recall of functional, daily information. Patient is currently consuming Dys. 3 textures with thin liquids via cup with minimal overt s/s of aspiration and supervision level verbal cues for use of swallowing compensatory strategies. Patient and family education is complete and patient will discharge home with 24 hour supervision from family.  Patient would benefit from f/u SLP services to maximize her cognitive and swallowing function in order to reduce caregiver burden.  ? ?Care Partner:  ?Caregiver Able to Provide Assistance: Yes  ?  ? ?Recommendation:  ?Home Health SLP;24 hour supervision/assistance  ?Rationale for SLP Follow Up: Reduce caregiver burden;Maximize cognitive function and independence;Maximize swallowing safety  ? ?Equipment: Provale cup  ? ?Reasons for discharge: Discharged from hospital;Treatment goals met  ? ?Patient/Family Agrees with Progress Made and Goals Achieved: Yes  ? ? ?Tonya Carlile ?01/04/2022, 6:19 AM ? ?

## 2022-01-05 ENCOUNTER — Telehealth: Payer: Self-pay | Admitting: Cardiovascular Disease

## 2022-01-05 MED ORDER — SENNA 8.6 MG PO TABS: 1.0000 | ORAL_TABLET | Freq: Two times a day (BID) | ORAL | 0 refills | Status: AC

## 2022-01-05 NOTE — Progress Notes (Signed)
Patient ID: Kayla Gray, female   DOB: 06/18/1936, 86 y.o.   MRN: [id number] ? ?No HHA secured. SW will continue to explore HHA options and f/u with family on Monday about results.  ? ?Loralee Pacas, MSW, LCSWA ?Office: [phone] ?Cell: [phone] ?Fax: [phone]  ?

## 2022-01-05 NOTE — Telephone Encounter (Signed)
Spoke with patients spouse per release form. He wanted to schedule appointment for follow up. He also wanted to discuss monitor. Lengthy discussion over monitor. Instructed him to return items in bag provided and we can discuss at her upcoming appointment. He was very appreciative for the time with no further questions at this time.  ?

## 2022-01-05 NOTE — Progress Notes (Signed)
Inpatient Rehabilitation Care Coordinator ?Discharge Note  ? ?Patient Details  ?Name: Kayla Gray ?MRN: [id number] ?Date of Birth: 04/30/1936 ? ? ?Discharge location: D/c to home with husband ? ?Length of Stay: 10 days ? ?Discharge activity level: [facility] [place] with RW ? ?Home/community participation: Limited ? ?Patient response YQ:MVHQIO Literacy - How often do you need to have someone help you when you read instructions, pamphlets, or other written material from your doctor or pharmacy?: Never ? ?Patient response NG:EXBMWU Isolation - How often do you feel lonely or isolated from those around you?: Never ? ?Services provided included: MD, RD, PT, OT, SLP, TR, Pharmacy, Neuropsych, SW, CM, RN ? ?Financial Services:  ?Charity fundraiser Utilized: Private Insurance ?[employer] ? ?Choices offered to/list presented to: Yes ? ?Follow-up services arranged:  ?DME, Home Health ?Home Health Agency: Unable to secure [facility] due to staffing and/or insurance. Pt is aware SW will continue to explore options, and if still unable to secure, witll f.u with her dtr [facility] about scheduling outpatient therapies.  ?  ?DME : RW and TTB with [employer] [facility] ?  ? ?Patient response to transportation need: ?Is the patient able to respond to transportation needs?: Yes ?In the past 12 months, has lack of transportation kept you from medical appointments or from getting medications?: No ?In the past 12 months, has lack of transportation kept you from meetings, work, or from getting things needed for daily living?: No ? ?Comments (or additional information): ? ?Patient/Family verbalized understanding of follow-up arrangements:  Yes ? ?Individual responsible for coordination of the follow-up plan: contact [name] ? ?Confirmed correct DME delivered: Rana Snare 01/05/2022   ? ?Rana Snare ?

## 2022-01-05 NOTE — Telephone Encounter (Signed)
Patient was in the hospital due to a fall and the hospital advised her to call our office and discuss getting her monitor placed here. Please call

## 2022-01-05 NOTE — Progress Notes (Signed)
?                                                       PROGRESS NOTE ? ? ?Subjective/Complaints: ? ?No new issues. Excited about getting home today! Met sister who is helping to take home ? ?ROS: Patient denies fever, rash, sore throat, blurred vision, dizziness, nausea, vomiting, diarrhea, cough, shortness of breath or chest pain, joint or back/neck pain, headache, or mood change.  ? ?Objective: ?  ?No results found. ?No results for input(s): WBC, HGB, HCT, PLT in the last 72 hours. ? ? ?No results for input(s): NA, K, CL, CO2, GLUCOSE, BUN, CREATININE, CALCIUM in the last 72 hours. ? ? ? ?Intake/Output Summary (Last 24 hours) at 01/05/2022 0911 ?Last data filed at 01/05/2022 0347 ?Gross per 24 hour  ?Intake 440 ml  ?Output --  ?Net 440 ml  ?  ? ?  ? ?Physical Exam: ?Vital Signs ?Blood pressure (!) 143/76, pulse 70, temperature 98.8 ?F (37.1 ?C), resp. rate 15, height '5\' 2"'  (1.575 m), weight 73.1 kg, SpO2 92 %. ? ? ? ?Constitutional: No distress . Vital signs reviewed. ?HEENT: NCAT, EOMI, oral membranes moist ?Neck: supple ?Cardiovascular: RRR without murmur. No JVD    ?Respiratory/Chest: CTA Bilaterally without wheezes or rales. Normal effort    ?GI/Abdomen: BS +, non-tender, non-distended ?Ext: no clubbing, cyanosis, or edema ?Psych: pleasant and cooperative  ?Skin:scalp cdi with staples out ?Neuro:  alert and oriented. Improved insight and awareness. Still some STM deficits. Cranial nerve exam unremarkable. Mild left sided weakness. RUE and RLE 5/5. Sensory exam normal for light touch and pain in all 4 limbs. No limb ataxia or cerebellar signs. No abnormal tone appreciated.   ?Musculoskeletal: old TKA incisions, good knee ROM--stable ? ? ?Assessment/Plan: ?1. Functional deficits which require 3+ hours per day of interdisciplinary therapy in a comprehensive inpatient rehab setting. ?Physiatrist is providing close team supervision and 24 hour management of active medical problems listed below. ?Physiatrist and  rehab team continue to assess barriers to discharge/monitor patient progress toward functional and medical goals ? ?Care Tool: ? ?Bathing ?   ?Body parts bathed by patient: Right arm, Left arm, Chest, Abdomen, Front perineal area, Buttocks, Right upper leg, Left upper leg, Right lower leg, Left lower leg, Face  ? Body parts bathed by helper: Right lower leg, Left lower leg ?  ?  ?Bathing assist Assist Level: Supervision/Verbal cueing ?  ?  ?Upper Body Dressing/Undressing ?Upper body dressing   ?What is the patient wearing?: Pull over shirt ?   ?Upper body assist Assist Level: Supervision/Verbal cueing ?   ?Lower Body Dressing/Undressing ?Lower body dressing ? ? ?   ?What is the patient wearing?: Underwear/pull up, Pants ? ?  ? ?Lower body assist Assist for lower body dressing: Supervision/Verbal cueing ?   ? ?Toileting ?Toileting    ?Toileting assist Assist for toileting: Supervision/Verbal cueing ?Assistive Device Comment: walker ?  ?Transfers ?Chair/bed transfer ? ?Transfers assist ?   ? ?Chair/bed transfer assist level: Supervision/Verbal cueing ?Chair/bed transfer assistive device: Walker ?  ?Locomotion ?Ambulation ? ? ?Ambulation assist ? ?   ? ?Assist level: Supervision/Verbal cueing ?Assistive device: Walker-rolling ?Max distance: 280 ft  ? ?Walk 10 feet activity ? ? ?Assist ?   ? ?Assist level: Supervision/Verbal cueing ?Assistive device: Walker-rolling  ? ?Walk  50 feet activity ? ? ?Assist   ? ?Assist level: Supervision/Verbal cueing ?Assistive device: Walker-rolling  ? ? ?Walk 150 feet activity ? ? ?Assist Walk 150 feet activity did not occur: Safety/medical concerns ? ?Assist level: Supervision/Verbal cueing ?Assistive device: Walker-rolling ?  ? ?Walk 10 feet on uneven surface  ?activity ? ? ?Assist Walk 10 feet on uneven surfaces activity did not occur: Safety/medical concerns ? ? ?Assist level: Contact Guard/Touching assist ?Assistive device: Walker-rolling  ? ?Wheelchair ? ? ? ? ?Assist Is the patient  using a wheelchair?: No ?Type of Wheelchair: Manual ?  ? ?Wheelchair assist level: Total Assistance - Patient < 25% ?Max wheelchair distance: 150 ft  ? ? ?Wheelchair 50 feet with 2 turns activity ? ? ? ?Assist ? ?  ?  ? ? ?Assist Level: Total Assistance - Patient < 25%  ? ?Wheelchair 150 feet activity  ? ? ? ?Assist ?   ? ? ?Assist Level: Total Assistance - Patient < 25%  ? ?Blood pressure (!) 143/76, pulse 70, temperature 98.8 ?F (37.1 ?C), resp. rate 15, height '5\' 2"'  (1.575 m), weight 73.1 kg, SpO2 92 %. ? ?Medical Problem List and Plan: ?1. Functional deficits secondary to right SDH after fall. Pt s/p craniotomy ?            dc home today ? -f/u with [facility], [facility], primary ?2.  Antithrombotics: ?-DVT/anticoagulation:  Pharmaceutical: Lovenox ?            -antiplatelet therapy: N/A due to bleed.  ?3. Pain Management: Tylenol prn.  ?4. Mood: LCSW to follow for evaluation and support.  ?            -antipsychotic agents: N/A ?5. Neuropsych: This patient is capable of making decisions on her own behalf. ?6. Skin/Wound Care:  staples out ?7. Fluids/Electrolytes/Nutrition: encourage PO ? -added protein ?8. HTN: Monitor BP TID--continue metoprolol and Losartan.  ? -bp controlled. HR a little elevated---obsv ?9. Chronic diastolic CHF: Low salt diet.  ?  ?Filed Weights  ? 01/02/22 0500 01/03/22 0500 01/04/22 0500  ?Weight: 73.2 kg 73 kg 73.1 kg  ?  ?  ?3/24 weights stable--  ?10. Pre-diabetes:  Hgb A1c-5.7. Added low carb restrictions.  ?-  well controlled. Dc'ed cbg's ?--RD   for diet education.  ?11. H/o SVT/ PAF: Monitor HR TID--continue metoprolol. Zio patch to be mailed to pt ?            -no issues, sx so far ?12. Seizure prophylaxis: Continue Keppra BID.  ?13. Hyperactive bladder: continue on ditropan ?14. Post-op anemia ? -hgb stable at  9.8 3/20 ? -added fe supp ?   ?15. Dysphagia: ? --  oropharyngeal dysphagia tolerating D3/thins with provale cup to help limit fluid bolus ? -will likely need to manage fluid bolus  chronically given hx ?   ? ?LOS: ?11 days ?A FACE TO FACE EVALUATION WAS PERFORMED ? ?Meredith Staggers ?01/05/2022, 9:11 AM  ? ?  ?

## 2022-01-08 ENCOUNTER — Other Ambulatory Visit: Payer: Self-pay | Admitting: Neurosurgery

## 2022-01-08 ENCOUNTER — Telehealth: Payer: Self-pay

## 2022-01-08 ENCOUNTER — Encounter: Payer: Self-pay | Admitting: Registered Nurse

## 2022-01-08 DIAGNOSIS — Z8679 Personal history of other diseases of the circulatory system: Secondary | ICD-10-CM | POA: Diagnosis not present

## 2022-01-08 DIAGNOSIS — Z09 Encounter for follow-up examination after completed treatment for conditions other than malignant neoplasm: Secondary | ICD-10-CM | POA: Diagnosis not present

## 2022-01-08 DIAGNOSIS — S065XAA Traumatic subdural hemorrhage with loss of consciousness status unknown, initial encounter: Secondary | ICD-10-CM

## 2022-01-08 DIAGNOSIS — Z9889 Other specified postprocedural states: Secondary | ICD-10-CM | POA: Diagnosis not present

## 2022-01-08 DIAGNOSIS — Z9181 History of falling: Secondary | ICD-10-CM | POA: Diagnosis not present

## 2022-01-08 NOTE — Telephone Encounter (Signed)
SW sent out HHPT/OT/SLP referral again, and continues to remain unsuccessful. ? ?SW left message for pt dtr Kelli to inform on unable to secure [facility] and outpatient PT/OT/SLP referral faxed to [facility] (p:[phone]/[phone]).  ? ?Case closed to SW.  ? ?Loralee Pacas, MSW, LCSWA ?Office: [phone] ?Cell: [phone] ?Fax: [phone]  ?

## 2022-01-09 DIAGNOSIS — Z8679 Personal history of other diseases of the circulatory system: Secondary | ICD-10-CM | POA: Diagnosis not present

## 2022-01-09 DIAGNOSIS — I4891 Unspecified atrial fibrillation: Secondary | ICD-10-CM | POA: Diagnosis not present

## 2022-01-09 DIAGNOSIS — Z9181 History of falling: Secondary | ICD-10-CM | POA: Diagnosis not present

## 2022-01-09 DIAGNOSIS — E785 Hyperlipidemia, unspecified: Secondary | ICD-10-CM | POA: Diagnosis not present

## 2022-01-16 ENCOUNTER — Ambulatory Visit: Payer: PPO | Attending: Physical Medicine and Rehabilitation

## 2022-01-16 DIAGNOSIS — R278 Other lack of coordination: Secondary | ICD-10-CM | POA: Diagnosis not present

## 2022-01-16 DIAGNOSIS — Z9889 Other specified postprocedural states: Secondary | ICD-10-CM | POA: Insufficient documentation

## 2022-01-16 DIAGNOSIS — R41841 Cognitive communication deficit: Secondary | ICD-10-CM | POA: Insufficient documentation

## 2022-01-16 DIAGNOSIS — S065XAA Traumatic subdural hemorrhage with loss of consciousness status unknown, initial encounter: Secondary | ICD-10-CM | POA: Diagnosis not present

## 2022-01-16 DIAGNOSIS — M6281 Muscle weakness (generalized): Secondary | ICD-10-CM | POA: Diagnosis not present

## 2022-01-16 DIAGNOSIS — R262 Difficulty in walking, not elsewhere classified: Secondary | ICD-10-CM | POA: Insufficient documentation

## 2022-01-16 DIAGNOSIS — R2681 Unsteadiness on feet: Secondary | ICD-10-CM | POA: Diagnosis not present

## 2022-01-17 DIAGNOSIS — S065XAA Traumatic subdural hemorrhage with loss of consciousness status unknown, initial encounter: Secondary | ICD-10-CM | POA: Diagnosis not present

## 2022-01-17 DIAGNOSIS — R55 Syncope and collapse: Secondary | ICD-10-CM | POA: Diagnosis not present

## 2022-01-17 DIAGNOSIS — I10 Essential (primary) hypertension: Secondary | ICD-10-CM | POA: Diagnosis not present

## 2022-01-17 DIAGNOSIS — R4189 Other symptoms and signs involving cognitive functions and awareness: Secondary | ICD-10-CM | POA: Diagnosis not present

## 2022-01-17 NOTE — Therapy (Signed)
[facility] ?[place], [place], [address] ?Phone: [phone]   Fax:  [phone] ? ?Occupational Therapy Evaluation ? ?Patient Details  ?Name: Kayla Gray ?MRN: [id number] ?Date of Birth: Dec 01, 1935 ?Referring Provider (OT): Reesa Chew, [place] ? ? ?Encounter Date: 01/16/2022 ? ? OT End of Session - 01/17/22 0917   ? ? Visit Number 1   ? Number of Visits 1   ? OT Start Time 1500   ? OT Stop Time 1555   ? OT Time Calculation (min) 55 min   ? Equipment Utilized During Treatment transport chair, RW   ? Activity Tolerance Patient tolerated treatment well   ? Behavior During Therapy [facility] for tasks assessed/performed   ? ?  ?  ? ?  ? ? ?Past Medical History:  ?Diagnosis Date  ? Aortic stenosis   ? Bilateral cataracts   ? Bleeding nose   ? Thurs night (09/08/15) and Fri (09/09/15) left side.Bleeding didn't last long  ? Breast cancer ([facility]) 1999  ? right breast lumpectomy and rad tx  ? Chronic diastolic (congestive) heart failure (HCC)   ? Depression   ? Diverticulosis   ? Gastroesophageal reflux disease   ? Heart murmur   ? Hyperlipidemia   ? Hypertension   ? Iron deficiency anemia   ? Obstructive sleep apnea   ? Osteoarthritis   ? Osteopenia   ? Peptic ulcer disease   ? Personal history of radiation therapy   ? S/P aortic valve replacement with bioprosthetic valve 09/16/2015  ? 23 mm Edwards Intuity Elite bioprosthetic tissue valve  ? Skin cancer 2020  ? Stroke [facility])   ? 11/14/13  ? Supraventricular tachycardia ([facility])   ? Syncope and collapse   ? Urinary tract infection   ? ? ?Past Surgical History:  ?Procedure Laterality Date  ? AORTIC VALVE REPLACEMENT N/A 09/16/2015  ? Procedure: AORTIC VALVE REPLACEMENT (AVR);  Surgeon: Rexene Alberts, MD;  Location: [facility];  Service: Open Heart Surgery;  Laterality: N/A;  ? BREAST BIOPSY Right 1999  ? breast ca radation  ? BREAST EXCISIONAL BIOPSY Left yrs ago   ? benign  ? BREAST LUMPECTOMY Right 1999  ? with radiation  ?  CARDIAC CATHETERIZATION N/A 08/17/2015  ? Procedure: Left Heart Cath and Coronary Angiography;  Surgeon: Minna Merritts, MD;  Location: [facility];  Service: Cardiovascular;  Laterality: N/A;  ? CATARACT EXTRACTION, BILATERAL    ? CHOLECYSTECTOMY    ? CRANIOTOMY Right 12/18/2021  ? Procedure: CRANIOTOMY HEMATOMA EVACUATION SUBDURAL;  Surgeon: Meade Maw, MD;  Location: [facility];  Service: Neurosurgery;  Laterality: Right;  ? JOINT REPLACEMENT Bilateral   ? knee replacement  ? KNEE ARTHROSCOPY    ? right  ? KNEE ARTHROSCOPY    ? left  ? SKIN CANCER EXCISION  2020  ? TEE WITHOUT CARDIOVERSION N/A 09/16/2015  ? Procedure: TRANSESOPHAGEAL ECHOCARDIOGRAM (TEE);  Surgeon: Rexene Alberts, MD;  Location: [facility];  Service: Open Heart Surgery;  Laterality: N/A;  ? TONSILLECTOMY    ? VAGINAL HYSTERECTOMY    ? ? ?There were no vitals filed for this visit. ? ? Subjective Assessment - 01/17/22 0912   ? ? Subjective  Pt present today with niece, Dorian Pod, who will be driving pt to most therapy sessions.   ? Pertinent History Recent fall while mopping on 12/18/21.  Pt hit head and sustained subdural hemorrhage.  Underwent craniotomy to evacuate SDH; discharged to CIR  before return home with family.   ? Limitations cognitive decline, balance   ? Patient Stated Goals return to independence; walk without walker   ? Currently in Pain? No/denies   pt reports occasional HA but no pain at time of eval  ? Pain Score 0-No pain   ? ?  ?  ? ?  ? ? ? ? [facility] OT Assessment - 01/16/22 1511   ? ?  ? Assessment  ? Medical Diagnosis SDH   ? Referring Provider (OT) Reesa Chew, PA   ? Onset Date/Surgical Date 12/18/21   ? Hand Dominance Right   ? Next MD Visit 01/17/22 PCP   ? Prior Therapy CIR 10 days   ?  ? Precautions  ? Precautions Fall   ?  ? Restrictions  ? Weight Bearing Restrictions No   ?  ? Balance Screen  ? Has the patient fallen in the past 6 months Yes   ? How many times? 1   ? Has the patient had a decrease in activity level  because of a fear of falling?  Yes   ? Is the patient reluctant to leave their home because of a fear of falling?  No   ?  ? Home  Environment  ? Family/patient expects to be discharged to: Private residence   ? Living Arrangements Spouse/significant other   ? Available Help at Discharge Family   daily help up to 5pm between family and friends.  Spouse is with pt in the evenings but is limited physically d/t spouse being 82.  ? Type of Home House   ? Home Access Stairs   ? Home Layout One level   ? Alternate Level Stairs - Number of Steps 2 steps to enter/exit off kitchen; 1 rail   ? Bathroom Shower/Tub Tub/Shower unit   ? Bathroom Toilet Handicapped height   ? [place] - 2 wheels;Tub bench;Bedside commode   ?  ? Prior Function  ? Level of Independence Independent   ? Vocation Retired   ? Leisure working in her flowers, shopping   ?  ? ADL  ? Eating/Feeding Independent   ? Toilet Transfer Supervision/safety   distant supv; caregiver amb with pt to bathroom and caregiver stays outside bathroom door  ? ADL comments Pt performs basic self care with supv from caregivers d/t gait instability   ?  ? IADL  ? [employer] Needs help with all home maintenance tasks   caregivers/family currently manage d/t balance deficits  ? Medication Management Is not capable of dispensing or managing own medication   caregivers manage medications  ?  ? Mobility  ? Mobility Status History of falls   ? Mobility Status Comments using RW with supv   ?  ? Vision - History  ? Baseline Vision Wears glasses only for reading   ? Additional Comments no changes from baseline   ?  ? Cognition  ? Overall Cognitive Status Impaired/Different from baseline   forgetful, inconsistent to follow 2 step commands; defer in depth cognitive assessment to SLP  ?  ? Posture/Postural Control  ? Posture Comments slight forward lean/hip flexion when amb with RW   ?  ? Sensation  ? Light Touch Appears Intact   ? Proprioception Appears Intact   ?  ?  Coordination  ? Gross Motor Movements are Fluid and Coordinated Yes   ? Fine Motor Movements are Fluid and Coordinated Yes   ? Finger Nose Finger Test intact bilat   ?  Right 9 Hole Peg Test 26 sec   ? Left 9 Hole Peg Test 29 sec   ?  ? AROM  ? Overall AROM Comments BUEs WFL for self care tasks   ?  ? Strength  ? Overall Strength Comments BUEs 4+/5   ?  ? Hand Function  ? Right Hand Grip (lbs) 32   ? Right Hand Lateral Pinch 10 lbs   ? Right Hand 3 Point Pinch 9 lbs   ? Left Hand Grip (lbs) 22   non-dominant side and hx of old CVA affecting this side, per niece  ? Left Hand Lateral Pinch 10 lbs   ? Left 3 point pinch 9 lbs   ? ?  ?  ? ?  ? ?Occupational Therapy: ?Pt is an 86 y/o female referred to outpatient for OT/PT/SLP for evaluations post fall.  Pt sustained SDH and underwent craniotomy for evacuation of SDH.  Prior to fall, pt was indep with all aspects of daily activities and ambulated indep without AD.  Pt currently using RW and has daytime assist every day between family and private caregivers, and spouse is also there in the evenings when caregiver leaves.  Pt currently performing basic self care at supv level d/t balance deficits.  BUEs strength and coordination intact.  No changes in vision and mild inattention to L side that was present early on after her fall appears to have resolved.  OT reinforced fall prevention and reviewed AE recommendations for ADLs.  Pt has all necessary AE for bathroom safety.  No additional skilled OT indicated at this time.  Pt will address balance with PT and will have cognitive assessment from SLP.  Pt, daughter, and niece in agreement with OT plan for evaluation only.  ? ? ? ? OT Education - 01/17/22 0917   ? ? Education Details Role of OT, fall prevention, evaluation only   ? Person(s) Educated Associate Professor)   ? Methods Explanation;Verbal cues   ? Comprehension Verbalized understanding;Verbal cues required   ? ?  ?  ? ?  ? ? ? OT Short Term Goals - 01/17/22 1218    ? ?  ? OT SHORT TERM GOAL #1  ? Title Pt will perform functional ADL transfers with RW and supv from caregivers.   ? Baseline Eval: met   ? Time 1   ? Period Days   ? Status Achieved   ? Target Date 04/0

## 2022-01-23 ENCOUNTER — Inpatient Hospital Stay: Payer: PPO | Admitting: Registered Nurse

## 2022-01-24 ENCOUNTER — Encounter: Payer: PPO | Admitting: Occupational Therapy

## 2022-01-24 ENCOUNTER — Ambulatory Visit: Payer: PPO | Admitting: Speech Pathology

## 2022-01-24 ENCOUNTER — Ambulatory Visit: Payer: PPO

## 2022-01-24 DIAGNOSIS — S065XAA Traumatic subdural hemorrhage with loss of consciousness status unknown, initial encounter: Secondary | ICD-10-CM | POA: Diagnosis not present

## 2022-01-24 DIAGNOSIS — R278 Other lack of coordination: Secondary | ICD-10-CM

## 2022-01-24 DIAGNOSIS — Z9889 Other specified postprocedural states: Secondary | ICD-10-CM

## 2022-01-24 DIAGNOSIS — R2681 Unsteadiness on feet: Secondary | ICD-10-CM

## 2022-01-24 DIAGNOSIS — R262 Difficulty in walking, not elsewhere classified: Secondary | ICD-10-CM

## 2022-01-24 DIAGNOSIS — R41841 Cognitive communication deficit: Secondary | ICD-10-CM

## 2022-01-24 DIAGNOSIS — M6281 Muscle weakness (generalized): Secondary | ICD-10-CM

## 2022-01-24 NOTE — Therapy (Signed)
[facility] ?[place], [place], [address] ?Phone: [phone]   Fax:  [phone] ? ?Physical Therapy Evaluation ? ?Patient Details  ?Name: Kayla Gray ?MRN: [id number] ?Date of Birth: November 27, 1935 ?Referring Provider (PT): Love, Ivan Anchors, PA-C ? ? ?Encounter Date: 01/24/2022 ? ? PT End of Session - 01/24/22 1641   ? ? Visit Number 1   ? Number of Visits 25   ? Date for PT Re-Evaluation 04/18/22   ? PT Start Time 1603   ? PT Stop Time 9470   ? PT Time Calculation (min) 55 min   ? Equipment Utilized During Treatment Gait belt   ? Activity Tolerance Patient tolerated treatment well   ? Behavior During Therapy [facility] for tasks assessed/performed   ? ?  ?  ? ?  ? ? ?Past Medical History:  ?Diagnosis Date  ? Aortic stenosis   ? Bilateral cataracts   ? Bleeding nose   ? Thurs night (09/08/15) and Fri (09/09/15) left side.Bleeding didn't last long  ? Breast cancer ([place]) 1999  ? right breast lumpectomy and rad tx  ? Chronic diastolic (congestive) heart failure (HCC)   ? Depression   ? Diverticulosis   ? Gastroesophageal reflux disease   ? Heart murmur   ? Hyperlipidemia   ? Hypertension   ? Iron deficiency anemia   ? Obstructive sleep apnea   ? Osteoarthritis   ? Osteopenia   ? Peptic ulcer disease   ? Personal history of radiation therapy   ? S/P aortic valve replacement with bioprosthetic valve 09/16/2015  ? 23 mm Edwards Intuity Elite bioprosthetic tissue valve  ? Skin cancer 2020  ? Stroke [facility])   ? 11/14/13  ? Supraventricular tachycardia ([place])   ? Syncope and collapse   ? Urinary tract infection   ? ? ?Past Surgical History:  ?Procedure Laterality Date  ? AORTIC VALVE REPLACEMENT N/A 09/16/2015  ? Procedure: AORTIC VALVE REPLACEMENT (AVR);  Surgeon: Rexene Alberts, MD;  Location: [place];  Service: Open Heart Surgery;  Laterality: N/A;  ? BREAST BIOPSY Right 1999  ? breast ca radation  ? BREAST EXCISIONAL BIOPSY Left yrs ago   ? benign  ? BREAST LUMPECTOMY  Right 1999  ? with radiation  ? CARDIAC CATHETERIZATION N/A 08/17/2015  ? Procedure: Left Heart Cath and Coronary Angiography;  Surgeon: Minna Merritts, MD;  Location: [facility];  Service: Cardiovascular;  Laterality: N/A;  ? CATARACT EXTRACTION, BILATERAL    ? CHOLECYSTECTOMY    ? CRANIOTOMY Right 12/18/2021  ? Procedure: CRANIOTOMY HEMATOMA EVACUATION SUBDURAL;  Surgeon: Meade Maw, MD;  Location: [facility];  Service: Neurosurgery;  Laterality: Right;  ? JOINT REPLACEMENT Bilateral   ? knee replacement  ? KNEE ARTHROSCOPY    ? right  ? KNEE ARTHROSCOPY    ? left  ? SKIN CANCER EXCISION  2020  ? TEE WITHOUT CARDIOVERSION N/A 09/16/2015  ? Procedure: TRANSESOPHAGEAL ECHOCARDIOGRAM (TEE);  Surgeon: Rexene Alberts, MD;  Location: [facility];  Service: Open Heart Surgery;  Laterality: N/A;  ? TONSILLECTOMY    ? VAGINAL HYSTERECTOMY    ? ? ?There were no vitals filed for this visit. ? ? ? Subjective Assessment - 01/24/22 1625   ? ? Subjective Pt referred to outpatient PT following traumatic subdural hemorrhage due to a fall while mopping (pt unsure if she lost consciousness with fall) on March 6th 2023. Pt underwent craniotomy to evacuate SDH. Pt discharged  to CIR before returning to home with family. She is accompanied today by her niece. Pt current symptoms include "slight" headaches, FOF, and difficulty with balance. She feels most unsteady when she is out, such as at a grocery store. Pt denies pain and dizziness. Pt was using SPC or furniture walking prior to her fall. She currenlty uses a 2WW. She is receiving help at home with bathing, dressing and making her bed.   ? Patient is accompained by: Family member   niece [name].  ? Pertinent History Pt referred to outpatient PT following traumatic subdural hemorrhage due to a fall while mopping (pt unsure if she lost consciousness with fall) on March 6th 2023. Pt underwent craniotomy to evacuate SDH. Pt discharged to CIR before returning to home with  family. She is accompanied today by her niece. Pt current symptoms include "slight" headaches, FOF, and difficulty with balance. She feels most unsteady when she is out, such as at a grocery store. Pt denies pain and dizziness. Pt was using SPC or furniture walking prior to her fall. She currenlty uses a 2WW. She is receiving help at home with bathing, dressing and making her bed. Per chart PMH is significant for the following: aortic stenosis, cataracts, breast CA, chronic diastolic (congestive) heart failure, depression, diverticulosis, HTN, iron deficiency anemia, obstructive sleep apnea, OA, osteopenia, s/p aortic valve replacement with bioprosthetic valve (2016), skin CA, stroke, supraventricular tachycardia, syncope and collapse, UTI   ? Limitations Standing;Walking;Lifting;House hold activities   ? How long can you sit comfortably? not limited   ? How long can you stand comfortably? <10 min due to limited strength/energy   ? How long can you walk comfortably? Pt thinks between 5-10 minutse due to limited strength/energy   ? Diagnostic tests Pt with extensive imaging. Please refer to chart for full details. 3/7: MRI BRAIN WO CONTRAST: ?   IMPRESSION:  1. Small region of acute ischemia in the right superior frontal  gyrus near the vertex corresponding to the finding on the same-day  head CT, and punctate acute infarct in the right insular cortex.  2. Stable postsurgical changes reflecting right frontoparietal  craniotomy for subdural hematoma evacuation. Residual subdural blood  over the right cerebral convexity, layering along the falx  posteriorly, and right tentorial leaflet is overall similar to the  same-day head CT, with minimal mass effect and no measurable midline  shift.  3. Subdural collections are also seen overlying the bilateral  cerebellar hemispheres extending to the craniocervical junction.  Please also see the separately dictated cervical spine MRI.?    3/7 MR cervical spine WO contrast: ?  IMPRESSION:  Extramedullary hemorrhage within the upper cervical spinal canal  (extending from the craniocervical junction to the C5 level).  Resultant mild-to-moderate narrowing of the upper cervical spinal  canal (without spinal cord mass effect).     Cervical spondylosis, as outlined and having progressed at multiple  levels since the prior MRI of 05/01/2017.     At C6-C7, there is progressive moderate/advanced disc degeneration  with mild degenerative endplate edema. A posterior disc osteophyte  complex contributes to progressive moderate spinal canal stenosis.  Multifactorial severe bilateral neural foraminal narrowing also  present at this level.     No more than mild degenerative spinal canal narrowing at the  remaining levels.     Multifactorial moderate left neural foraminal narrowing at C5-C6.?   ? Patient Stated Goals Pt would like to be able to walk without using a walker. She would  like to feel confident going to the grocery store.   ? Currently in Pain? No/denies   ? ?  ?  ? ?  ? ? ?SUBJECTIVE ? ?Chief complaint: Pt here following traumatic subdural hemorrhage due to a fall. See subjective section for details. ? ? ?History: ?Red flags (bowel/bladder changes, saddle paresthesia, personal history of cancer, chills/fever, night sweats, unrelenting pain): Negative ?Referring Provider: Bary Leriche, PA-C ?Prior history of physical therapy for balance: yes ?Dominant hand: right ?Falls in the last 6 months: Yes  ?Hobbies: Pt likes to read, she likes to work in her yard (has flowers) ?Goals: Pt would like to be able to walk without using a walker. She would like to feel confident going to the grocery store.  ? ? ?OBJECTIVE ? ?Musculoskeletal ?Tremor: Absent ?Bulk: Normal ? ?Posture ?Rounded shoulders, slight kyphosis in seated and standing. ? ? ?Gait ?Impaired gait speed (see 10MWT, below), impaired endurance with gait, wide BOS.  ? ? ?Strength ?R/L ?Strength grossly 4/5  BLEs. ? ? ?NEUROLOGICAL: ? ? ?Sensation ?Intact to light touch in BLEs. Denies n/t. ? ? ?Coordination/Cerebellar ?Finger to Nose: WNL ?Heel to Shin: WNL ?Rapid alternating movements: WNL ? ? ?FUNCTIONAL OUTCOME MEASURES ? ? Results Comments  ?BER

## 2022-01-25 NOTE — Therapy (Signed)
[place] ?[place], [place], [address] ?Phone: [phone]   Fax:  [phone] ? ?Speech Language Pathology Evaluation ? ?Patient Details  ?Name: Kayla Gray ?MRN: [id number] ?Date of Birth: Nov 24, 1935 ?Referring Provider (SLP): Reesa Chew, PA (CIR) ? ? ?Encounter Date: 01/24/2022 ? ? End of Session - 01/25/22 1556   ? ? Visit Number 1   ? Number of Visits 25   ? Date for SLP Re-Evaluation 04/19/22   ? Authorization Type [employer]   ? Authorization Time Period 01/24/2022 thru 04/19/2022   ? Authorization - Visit Number 1   ? Progress Note Due on Visit 10   ? SLP Start Time 1500   ? SLP Stop Time  1600   ? SLP Time Calculation (min) 60 min   ? Activity Tolerance Patient tolerated treatment well   ? ?  ?  ? ?  ? ? ?Past Medical History:  ?Diagnosis Date  ? Aortic stenosis   ? Bilateral cataracts   ? Bleeding nose   ? Thurs night (09/08/15) and Fri (09/09/15) left side.Bleeding didn't last long  ? Breast cancer ([place]) 1999  ? right breast lumpectomy and rad tx  ? Chronic diastolic (congestive) heart failure (HCC)   ? Depression   ? Diverticulosis   ? Gastroesophageal reflux disease   ? Heart murmur   ? Hyperlipidemia   ? Hypertension   ? Iron deficiency anemia   ? Obstructive sleep apnea   ? Osteoarthritis   ? Osteopenia   ? Peptic ulcer disease   ? Personal history of radiation therapy   ? S/P aortic valve replacement with bioprosthetic valve 09/16/2015  ? 23 mm Edwards Intuity Elite bioprosthetic tissue valve  ? Skin cancer 2020  ? Stroke [facility])   ? 11/14/13  ? Supraventricular tachycardia ([place])   ? Syncope and collapse   ? Urinary tract infection   ? ? ?Past Surgical History:  ?Procedure Laterality Date  ? AORTIC VALVE REPLACEMENT N/A 09/16/2015  ? Procedure: AORTIC VALVE REPLACEMENT (AVR);  Surgeon: Rexene Alberts, MD;  Location: [place];  Service: Open Heart Surgery;  Laterality: N/A;  ? BREAST BIOPSY Right 1999  ? breast ca  radation  ? BREAST EXCISIONAL BIOPSY Left yrs ago   ? benign  ? BREAST LUMPECTOMY Right 1999  ? with radiation  ? CARDIAC CATHETERIZATION N/A 08/17/2015  ? Procedure: Left Heart Cath and Coronary Angiography;  Surgeon: Minna Merritts, MD;  Location: [place] [facility];  Service: Cardiovascular;  Laterality: N/A;  ? CATARACT EXTRACTION, BILATERAL    ? CHOLECYSTECTOMY    ? CRANIOTOMY Right 12/18/2021  ? Procedure: CRANIOTOMY HEMATOMA EVACUATION SUBDURAL;  Surgeon: Meade Maw, MD;  Location: [facility];  Service: Neurosurgery;  Laterality: Right;  ? JOINT REPLACEMENT Bilateral   ? knee replacement  ? KNEE ARTHROSCOPY    ? right  ? KNEE ARTHROSCOPY    ? left  ? SKIN CANCER EXCISION  2020  ? TEE WITHOUT CARDIOVERSION N/A 09/16/2015  ? Procedure: TRANSESOPHAGEAL ECHOCARDIOGRAM (TEE);  Surgeon: Rexene Alberts, MD;  Location: [place];  Service: Open Heart Surgery;  Laterality: N/A;  ? TONSILLECTOMY    ? VAGINAL HYSTERECTOMY    ? ? ?There were no vitals filed for this visit. ? ? Subjective Assessment - 01/25/22 1550   ? ? Subjective pt pleasant, known to the this writer from initial admission to acute care at [facility] post fall   ?  Patient is accompained by: Family member   pt's cousin  ? Currently in Pain? No/denies   ? ?  ?  ? ?  ? ? ? ? ? SLP Evaluation [facility] - 01/25/22 1550   ? ?  ? SLP Visit Information  ? SLP Received On 01/24/22   ? Referring Provider (SLP) Reesa Chew, PA (CIR)   ? Onset Date 12/18/2021   ? Medical Diagnosis R SDH s/p craniotomy   ?  ? General Information  ? HPI Kayla Gray is an 86 year old female with history of HTN, iron deficiency anemia, chronic dCHF, breast cancer, SVT who was admitted to [facility] on 12/18/21 after fall (while mopping her floor), striking back of her head with syncope. She was noted to have hematoma on the back of her head, reported HA and was vomiting at admission. CT head done revealing 1.3 cm R-SDH along falx and right tentorium with 1 cm leftward midline shift and  moderate size occipital scalp hematoma. She was taken to OR for right crani for evacuation of SDH with placement of SD drain by Dr. Cari Caraway the same day. Post op with lethargy with ongoing HA and EEG done revealing right frontal cortical dysfunction with  moderate diffuse encephalopathy and was negative for seizures. Admitted to CIR on 12/25/2021 with discharge at the supervision to minimal assistance level on 01/04/2022.   ? Behavioral/Cognition Pt appears reserved, smiles and agrees but uncertain if pt truly understands/comprehends information   ? Mobility Status arrives in [place] Chair   ?  ? Balance Screen  ? Has the patient fallen in the past 6 months Yes   ? How many times? 1   ? Has the patient had a decrease in activity level because of a fear of falling?  Yes   ? Is the patient reluctant to leave their home because of a fear of falling?  No   ?  ? Prior Functional Status  ? Cognitive/Linguistic Baseline Information not available   ? Type of Home House   ?  Lives With Spouse   has paid caregivers  ? Available Support Family;Personal care attendant   ? Vocation Retired   ?  ? Cognition  ? Overall Cognitive Status Impaired/Different from baseline   ? Area of Impairment Attention;Memory;Following commands;Safety/judgement;Awareness;Problem solving   ? Current Attention Level Sustained   ? Memory Decreased recall of precautions;Decreased short-term memory   ? Following Commands Follows multi-step commands inconsistently   ? Safety/Judgement Decreased awareness of safety;Decreased awareness of deficits   ? Awareness Emergent   ? Problem Solving Slow processing;Difficulty sequencing   ? Attention Selective   ? Selective Attention Impaired   ? Selective Attention Impairment Verbal complex;Functional complex   ? Memory Impaired   ? Memory Impairment Decreased recall of new information;Decreased short term memory;Retrieval deficit   ? Decreased Short Term Memory Verbal basic;Functional basic   ? Awareness Impaired    ? Awareness Impairment Emergent impairment;Anticipatory impairment   ? Problem Solving Impaired   ? Problem Solving Impairment Verbal complex;Functional complex   ? Executive Function Reasoning;Organizing;Decision Making   ? Reasoning Impaired   ? Reasoning Impairment Verbal complex;Functional complex   ? Organizing Impaired   ? Organizing Impairment Verbal complex;Functional complex   ? Decision Making Impaired   ? Decision Making Impairment Verbal complex;Functional complex   ? Self Correcting Impaired   ? Self Correcting Impairment Verbal complex;Functional complex   ?  ? Auditory Comprehension  ? Overall Auditory Comprehension Appears  within functional limits for tasks assessed   ?  ? Visual Recognition/Discrimination  ? Discrimination Within Function Limits   ?  ? Reading Comprehension  ? Reading Status Within funtional limits   ?  ? Expression  ? Primary Mode of Expression Verbal   ?  ? Verbal Expression  ? Overall Verbal Expression Appears within functional limits for tasks assessed   ?  ? Written Expression  ? Dominant Hand Right   ? Written Expression Within Functional Limits   ?  ? Oral Motor/Sensory Function  ? Overall Oral Motor/Sensory Function Appears within functional limits for tasks assessed   ?  ? Motor Speech  ? Overall Motor Speech Appears within functional limits for tasks assessed   ?  ? Standardized Assessments  ? Standardized Assessments  Cognitive Linguistic Quick Test   ? ?  ?  ? ?  ? ? ? ? ? ? ? ? ? ? ? ? ? ? ? ? ? ? SLP Education - 01/25/22 1556   ? ? Education Details Results of this assessment and ST POC   ? Person(s) Educated Associate Professor)   ? Methods Explanation;Demonstration;Verbal cues   ? Comprehension Verbalized understanding;Need further instruction   ? ?  ?  ? ?  ? ? ? SLP Short Term Goals - 01/25/22 1557   ? ?  ? SLP SHORT TERM GOAL #1  ? Title With moderate assistance, pt will a) recall and b) demonstrate use of at least three external memory strategies.   ? Baseline  new goal   ? Time 10   ? Period --   sessions  ? Status New   ?  ? SLP SHORT TERM GOAL #2  ? Title With moderate assistance, pt will sequence daily household tasks with > 90% accuracy.   ? Baseline new goal   ? T

## 2022-01-26 ENCOUNTER — Encounter: Payer: Self-pay | Admitting: Nurse Practitioner

## 2022-01-26 ENCOUNTER — Ambulatory Visit
Admission: RE | Admit: 2022-01-26 | Discharge: 2022-01-26 | Disposition: A | Discharge status: Home / Self Care | Payer: PPO | Source: Ambulatory Visit | Attending: Neurosurgery | Admitting: Neurosurgery

## 2022-01-26 ENCOUNTER — Ambulatory Visit (INDEPENDENT_AMBULATORY_CARE_PROVIDER_SITE_OTHER): Payer: PPO | Admitting: Nurse Practitioner

## 2022-01-26 VITALS — BP 130/80 | HR 68 | Ht 62.0 in | Wt 162.1 lb

## 2022-01-26 DIAGNOSIS — R55 Syncope and collapse: Secondary | ICD-10-CM

## 2022-01-26 DIAGNOSIS — I480 Paroxysmal atrial fibrillation: Secondary | ICD-10-CM

## 2022-01-26 DIAGNOSIS — I060 Rheumatic aortic stenosis: Secondary | ICD-10-CM

## 2022-01-26 DIAGNOSIS — I471 Supraventricular tachycardia: Secondary | ICD-10-CM | POA: Diagnosis not present

## 2022-01-26 DIAGNOSIS — Z953 Presence of xenogenic heart valve: Secondary | ICD-10-CM | POA: Diagnosis not present

## 2022-01-26 DIAGNOSIS — S065XAA Traumatic subdural hemorrhage with loss of consciousness status unknown, initial encounter: Secondary | ICD-10-CM | POA: Diagnosis not present

## 2022-01-26 DIAGNOSIS — I619 Nontraumatic intracerebral hemorrhage, unspecified: Secondary | ICD-10-CM | POA: Diagnosis not present

## 2022-01-26 DIAGNOSIS — S065X0A Traumatic subdural hemorrhage without loss of consciousness, initial encounter: Secondary | ICD-10-CM | POA: Diagnosis not present

## 2022-01-26 NOTE — Patient Instructions (Signed)
Medication Instructions:  ?No changes at this time.  ? ?*If you need a refill on your cardiac medications before your next appointment, please call your pharmacy* ? ? ?Lab Work: ?None ? ?If you have labs (blood work) drawn today and your tests are completely normal, you will receive your results only by: ?MyChart Message (if you have MyChart) OR ?A paper copy in the mail ?If you have any lab test that is abnormal or we need to change your treatment, we will call you to review the results. ? ? ?Testing/Procedures: ?None ? ? ?Follow-Up: ?At [facility], you and your health needs are our priority.  As part of our continuing mission to provide you with exceptional heart care, we have created designated Provider Care Teams.  These Care Teams include your primary Cardiologist (physician) and Advanced Practice Providers (APPs -  Physician Assistants and Nurse Practitioners) who all work together to provide you with the care you need, when you need it. ? ? ?Your next appointment:   ?3-4 month(s) ? ?The format for your next appointment:   ?In Person ? ?Provider:   ?Ida Rogue, MD or Murray Hodgkins, NP ? ? ? ?Important Information About Sugar ? ? ? ? ?  ?

## 2022-01-26 NOTE — Progress Notes (Signed)
? ? ?Office Visit  ?  ?Patient Name: Kayla Gray ?Date of Encounter: 01/26/2022 ? ?Primary Care Provider:  Derinda Late, MD ?Primary Cardiologist:  None ? ?Chief Complaint  ?  ?86 year old female with a history of severe aortic stenosis status post bioprosthetic aortic valve replacement in December 2016, postoperative atrial fibrillation, syncope, hypertension, hyperlipidemia, peptic ulcer disease, iron deficiency anemia, PSVT, breast cancer, and recent prolonged admission in the setting of fall and right-sided subdural hematoma requiring craniotomy with subsequent acute rehab stay, who presents for follow-up related to aortic stenosis. ? ?Past Medical History  ?  ?Past Medical History:  ?Diagnosis Date  ? Aortic stenosis   ? Bilateral cataracts   ? Bleeding nose   ? Thurs night (09/08/15) and Fri (09/09/15) left side.Bleeding didn't last long  ? Breast cancer ([place]) 1999  ? right breast lumpectomy and rad tx  ? Carotid arterial disease ([place])   ? a. 12/2018 Carotid U/S: RICA <54%, LICA 65-03%.  ? Chronic diastolic (congestive) heart failure (HCC)   ? Depression   ? Diverticulosis   ? Gastroesophageal reflux disease   ? Heart murmur   ? History of cardiac catheterization   ? a. 08/2015 Cath: nl cors. Sev AS (Peak grad 35mHg).  ? Hyperlipidemia   ? Hypertension   ? Iron deficiency anemia   ? Obstructive sleep apnea   ? Osteoarthritis   ? Osteopenia   ? Peptic ulcer disease   ? Personal history of radiation therapy   ? PS[place] ([place]ox[place]) [place]   ? S/P aortic valve replacement with bioprosthetic valve 09/16/2015  ? a. 09/2015 s/p 23 mm Edwards Intuity Elite bioprosthetic tissue valve; b. 12/2021 Echo: EF 60-65%, no rwma, GrI DD, nl RV fxn. Mild MR. Nl fxn'ing AoV prosthesis.  ? Skin cancer 2020  ? Stroke ([facility]   ? 11/14/13  ? Subdural hematoma (HCC)   ? a. 12/2021 fall-->R subdural hematoma w/ midlilne shift-->s/p craniotomy.  ? [place]ricular tachycardia ([place]   ? Syncope and collapse    ? Urinary tract infection   ? ?Past Surgical History:  ?Procedure Laterality Date  ? AORTIC VALVE REPLACEMENT N/A 09/16/2015  ? Procedure: AORTIC VALVE REPLACEMENT (AVR);  Surgeon: CRexene Alberts MD;  [place] [facility]  Service: Open Heart Surgery;  Laterality: N/A;  ? BREAST BIOPSY Right 1999  ? breast ca radation  ? BREAST EXCISIONAL BIOPSY Left yrs ago   ? benign  ? BREAST LUMPECTOMY Right 1999  ? with radiation  ? CARDIAC CATHETERIZATION N/A 08/17/2015  ? Procedure: Left Heart Cath and Coronary Angiography;  Surgeon: TMinna Merritts MD;  [facility];  Service: Cardiovascular;  Laterality: N/A;  ? CATARACT EXTRACTION, BILATERAL    ? CHOLECYSTECTOMY    ? CRANIOTOMY Right 12/18/2021  ? Procedure: CRANIOTOMY HEMATOMA EVACUATION SUBDURAL;  Surgeon: YMeade Maw MD;  Location: [facility];  Service: Neurosurgery;  Laterality: Right;  ? JOINT REPLACEMENT Bilateral   ? knee replacement  ? KNEE ARTHROSCOPY    ? right  ? KNEE ARTHROSCOPY    ? left  ? SKIN CANCER EXCISION  2020  ? TEE WITHOUT CARDIOVERSION N/A 09/16/2015  ? Procedure: TRANSESOPHAGEAL ECHOCARDIOGRAM (TEE);  Surgeon: CRexene Alberts MD;  [place] [facility]  Service: Open Heart Surgery;  Laterality: N/A;  ? TONSILLECTOMY    ? VAGINAL HYSTERECTOMY    ? ? ?Allergies ? ?Allergies  ?Allergen Reactions  ? Evista [Raloxifene]   ? Sulfa Antibiotics Hives  ? Codeine Hives  ?  Nitrofurantoin Rash  ? Oxycodone-Acetaminophen Rash  ? Statins Other (See Comments) and Rash  ?  Leg muscle cramps  ? ? ?History of Present Illness  ?  ?86 year old female with a history of severe aortic stenosis status post bioprosthetic aortic valve replacement in December 2016, postoperative atrial fibrillation, syncope, hypertension, hyperlipidemia, peptic ulcer disease, iron deficiency anemia, breast cancer, and PSVT.  She was previously admitted in February 2013 with PSVT and demand ischemia.  In February 2015, she was seen with TIA symptoms.  30-day monitoring did not  reveal any significant arrhythmias.  In the setting of progressive aortic stenosis, she underwent diagnostic catheterization in November 2016, which showed normal coronary arteries and severe aortic stenosis with a peak gradient of 70 mmHg.  She was subsequently evaluated by CT surgery and underwent successful bioprosthetic SAVR in December 2016.  Postoperative course was complicated by atrial fibrillation which required a short course of amiodarone and warfarin. ? ?[name] was last seen in cardiology clinic in January 2023, at which time she was doing well.  On December 18, 2021, she was mopping her floor and fell, and struck the back of her head.  She remembers falling, remembers putting her arm out to stop her from falling, and remembers hitting her head and then crawling over to her couch.  She eventually presented to [facility] ED and was found to have a 1.3 cm right subdural hematoma with leftward shift and underwent right craniotomy and evacuation.  Postoperatively, she had headaches and lethargy, and EEG showed moderate diffuse encephalopathy without seizures.  Altered mental status felt to be secondary to pain medications.  Echocardiogram during admission showed an EF of 60 to 65% with grade 1 diastolic dysfunction, mild MR, and normally functioning bioprosthetic aortic valve.  A Zio patch was placed at discharge, and she subsequently went to inpatient rehab at [facility].  With improvement in strength and activity tolerance, she was discharged home March 24. ? ?Since hospital discharge, her strength is continue to progress.  She has been using a walker to ambulate but feels strong enough to walk without it.  Activity remains fairly limited at this point but she has not been experiencing any chest pain, dyspnea, palpitations, or presyncope.  She has not yet placed the Zio monitor as she was not sure how to put it on and our staff will show her today.  She denies PND, orthopnea, dizziness, syncope, edema, or  early satiety. ? ?Home Medications  ?  ?Prior to Admission medications   ?Medication Sig Start Date End Date Taking? Authorizing Provider  ?acetaminophen (TYLENOL) 325 MG tablet Take 650 mg by mouth every 6 (six) hours as needed.   Yes [provider]  ?Calcium Carbonate-Vitamin D (CALCIUM 600 + D PO) Take by mouth 2 (two) times daily.   Yes [provider]  ?ezetimibe (ZETIA) 10 MG tablet Take 10 mg by mouth daily.   Yes [provider]  ?fluticasone (FLONASE) 50 MCG/ACT nasal spray Place 2 sprays into the nose daily as needed.   Yes [provider]  ?ketoconazole (NIZORAL) 2 % shampoo Apply topically 3 (three) times a week. 09/20/21  Yes [provider]  ?metoprolol tartrate (LOPRESSOR) 25 MG tablet Take 12.5 mg by mouth 2 (two) times daily.   Yes [provider]  ?Multiple Vitamin (MULTIVITAMIN) tablet Take 1 tablet by mouth daily.   Yes [provider]  ?oxybutynin (DITROPAN-XL) 5 MG 24 hr tablet Take 5 mg by mouth daily. 11/02/21  Yes [provider]  ?PARoxetine (PAXIL) 20 MG tablet Take 20 mg by mouth daily.   Yes [provider]  ?senna (SENOKOT) 8.6 MG TABS tablet Take 1 tablet (8.6 mg total) by mouth 2 (two) times daily. 01/05/22  Yes Love, Ivan Anchors, PA-C  ?pantoprazole (PROTONIX) 40 MG tablet Take 40 mg by mouth daily as needed. ?Patient not taking: Reported on 01/16/2022 07/04/14   [provider]  ?   ? ?Review of Systems  ?  ?Has been feeling well following hospitalization for subdural hematoma, craniotomy, and inpatient rehab.  She denies chest pain, dyspnea, palpitations, PND, orthopnea, dizziness, syncope, edema, or early satiety.  All other systems reviewed and are otherwise negative except as noted above. ?  ? ?Physical Exam  ?  ?VS:  BP 130/80 (BP Location: Left Arm, Patient Position: Sitting, Cuff Size: Normal)   Pulse 68   Ht '5\' 2"'$  (1.575 m)   Wt 162 lb 2 oz (73.5 kg)   SpO2 98%   BMI 29.65 kg/m?  , BMI  Body mass index is 29.65 kg/m?. ?    ?GEN: Well nourished, well developed, in no acute distress. ?HEENT: normal. ?Neck: Supple, no JVD, carotid bruits, or masses. ?Cardiac: RRR, 2/6 systolic murmur at the upper

## 2022-01-27 DIAGNOSIS — R55 Syncope and collapse: Secondary | ICD-10-CM | POA: Diagnosis not present

## 2022-01-29 ENCOUNTER — Ambulatory Visit: Payer: PPO | Admitting: Speech Pathology

## 2022-01-31 ENCOUNTER — Encounter: Payer: PPO | Admitting: Occupational Therapy

## 2022-01-31 ENCOUNTER — Ambulatory Visit: Payer: PPO | Admitting: Speech Pathology

## 2022-01-31 DIAGNOSIS — R41841 Cognitive communication deficit: Secondary | ICD-10-CM

## 2022-01-31 DIAGNOSIS — S065XAA Traumatic subdural hemorrhage with loss of consciousness status unknown, initial encounter: Secondary | ICD-10-CM | POA: Diagnosis not present

## 2022-01-31 NOTE — Therapy (Signed)
[facility] ?[place], [place], [address] ?Phone: [phone]   Fax:  [phone] ? ?Speech Language Pathology Treatment ? ?Patient Details  ?Name: Kayla Gray ?MRN: [id number] ?Date of Birth: [birth year] ?Referring Provider (SLP): Reesa Chew, PA (CIR) ? ? ?Encounter Date: 01/31/2022 ? ? End of Session - 01/31/22 1442   ? ? Visit Number 2   ? Number of Visits 25   ? Date for SLP Re-Evaluation 04/19/22   ? Authorization Type [employer]   ? Authorization Time Period 01/24/2022 thru 04/19/2022   ? Authorization - Visit Number 2   ? Progress Note Due on Visit 10   ? SLP Start Time 1500   ? SLP Stop Time  1600   ? SLP Time Calculation (min) 60 min   ? Activity Tolerance Patient tolerated treatment well   ? ?  ?  ? ?  ? ? ?Past Medical History:  ?Diagnosis Date  ? Aortic stenosis   ? Bilateral cataracts   ? Bleeding nose   ? Thurs night (09/08/15) and Fri (09/09/15) left side.Bleeding didn't last long  ? Breast cancer ([place]) 1999  ? right breast lumpectomy and rad tx  ? Carotid arterial disease ([place])   ? a. 12/2018 Carotid U/S: RICA <23%, LICA 53-61%.  ? Chronic diastolic (congestive) heart failure (HCC)   ? Depression   ? Diverticulosis   ? Gastroesophageal reflux disease   ? Heart murmur   ? History of cardiac catheterization   ? a. 08/2015 Cath: nl cors. Sev AS (Peak grad 62mHg).  ? Hyperlipidemia   ? Hypertension   ? Iron deficiency anemia   ? Obstructive sleep apnea   ? Osteoarthritis   ? Osteopenia   ? Peptic ulcer disease   ? Personal history of radiation therapy   ? PSVT (paroxysmal supraventricular tachycardia) ([place]   ? S/P aortic valve replacement with bioprosthetic valve 09/16/2015  ? a. 09/2015 s/p 23 mm Edwards Intuity Elite bioprosthetic tissue valve; b. 12/2021 Echo: EF 60-65%, no rwma, GrI DD, nl RV fxn. Mild MR. Nl fxn'ing AoV prosthesis.  ? Skin cancer 2020  ? [facility]   ? 11/14/13  ? Subdural hematoma (HCC)   ? a.  12/2021 fall-->R subdural hematoma w/ midlilne shift-->s/p craniotomy.  ? Supraventricular tachycardia ([place]   ? Syncope and collapse   ? Urinary tract infection   ? ? ?Past Surgical History:  ?Procedure Laterality Date  ? AORTIC VALVE REPLACEMENT N/A 09/16/2015  ? Procedure: AORTIC VALVE REPLACEMENT (AVR);  Surgeon: CRexene Alberts MD;  [place]  Service: Open Heart Surgery;  Laterality: N/A;  ? BREAST BIOPSY Right 1999  ? breast ca radation  ? BREAST EXCISIONAL BIOPSY Left yrs ago   ? benign  ? BREAST LUMPECTOMY Right 1999  ? with radiation  ? CARDIAC CATHETERIZATION N/A 08/17/2015  ? Procedure: Left Heart Cath and Coronary Angiography;  Surgeon: TMinna Merritts MD;  [facility];  Service: Cardiovascular;  Laterality: N/A;  ? CATARACT EXTRACTION, BILATERAL    ? CHOLECYSTECTOMY    ? CRANIOTOMY Right 12/18/2021  ? Procedure: CRANIOTOMY HEMATOMA EVACUATION SUBDURAL;  Surgeon: YMeade Maw MD;  Location: [facility];  Service: Neurosurgery;  Laterality: Right;  ? JOINT REPLACEMENT Bilateral   ? knee replacement  ? KNEE ARTHROSCOPY    ? right  ? KNEE ARTHROSCOPY    ? left  ? SKIN CANCER EXCISION  2020  ? TEE  WITHOUT CARDIOVERSION N/A 09/16/2015  ? Procedure: TRANSESOPHAGEAL ECHOCARDIOGRAM (TEE);  Surgeon: Rexene Alberts, MD;  Location: [place];  Service: Open Heart Surgery;  Laterality: N/A;  ? TONSILLECTOMY    ? VAGINAL HYSTERECTOMY    ? ? ?There were no vitals filed for this visit. ? ? Subjective Assessment - 01/31/22 1610   ? ? Subjective Patient pleasantly motivated for session accompanied by niece   ? Currently in Pain? No/denies   ? ?  ?  ? ?  ? ? ? ? ? ? ? ? ADULT SLP TREATMENT - 01/31/22 0001   ? ?  ? General Information  ? Behavior/Cognition Alert;Cooperative;Pleasant mood   ?  ? Treatment Provided  ? Treatment provided Cognitive-Linquistic   ?  ? Cognitive-Linquistic Treatment  ? Treatment focused on Cognition;Patient/family/caregiver education   ? Skilled Treatment Skilled  intervention targeted functional verbal sequencing and problem solving for the home/community environment. Patient demonstrated written sequencing of 4 component tasks with 100% accuracy x5 completed tasks. Patient demonstrated verbal inferencing for problem solving with 80% accuracy x10 improved to 100% with moderate contextual cues. Patient dmeonstrated problem solving of a map with 27% accuracy improved to 40% accuracy with minimal visual cues and to 100% with moderate level contextual and visual cues. Patient demonstrated functional verbal problem solving when given a home situation by identifying the problem with 100% accuracy x3 and solving the problem with 67% acc x3, improved to 100% with moderate context assist.   ?  ? Assessment / Recommendations / Plan  ? Plan Continue with current plan of care   ? ?  ?  ? ?  ? ? ? SLP Education - 01/31/22 1616   ? ? Education Details POC and goals targeting challenges per exam last session   ? Person(s) Educated Associate Professor)   ? Methods Explanation;Demonstration;Verbal cues   ? Comprehension Verbalized understanding;Need further instruction   ? ?  ?  ? ?  ? ? ? SLP Short Term Goals - 01/25/22 1557   ? ?  ? SLP SHORT TERM GOAL #1  ? Title With moderate assistance, pt will a) recall and b) demonstrate use of at least three external memory strategies.   ? Baseline new goal   ? Time 10   ? Period --   sessions  ? Status New   ?  ? SLP SHORT TERM GOAL #2  ? Title With moderate assistance, pt will sequence daily household tasks with > 90% accuracy.   ? Baseline new goal   ? Time 10   ? Period --   sessions  ? Status New   ?  ? SLP SHORT TERM GOAL #3  ? Title Given minimal assistance, pt will complete complex problem solving tasks with 80% accuracy.   ? Baseline moderate assistance required   ? Time 10   ? Period --   sessions  ? Status New   ? ?  ?  ? ?  ? ? ? SLP Long Term Goals - 01/25/22 1559   ? ?  ? SLP LONG TERM GOAL #1  ? Title Patient will demonstrate improved  cognitive linguistic function for supervised completion of iADLS tasks in home/community environments   ? Baseline moderate assistance required   ? Time 12   ? Period Weeks   ? Status New   ? Target Date 04/19/22   ? ?  ?  ? ?  ? ? ? Plan - 01/31/22 1616   ? ? Clinical  Impression Statement Pt presents with moderate cognitive communication impairment with mild deficits in selective attention, emergent awareness and safety awareness as well as moderate deficits in complex problem solving, recall of functional daily information and severe executive function. Intervention targeted cog/com deficits per data above with initiation of training of compensatory strategies attention and problem solving. Patient benefits from moderate level contextual cues for improved accuracy and function. Skilled ST intervention is required to improve moderate deficits toward baseline function and for increased independence in the home/community environment   ? ?  ?  ? ?  ? ? ?Patient will benefit from skilled therapeutic intervention in order to improve the following deficits and impairments:   ?Cognitive communication deficit ? ? ? ?Problem List ?Patient Active Problem List  ? Diagnosis Date Noted  ? Dysphagia 01/02/2022  ? ABLA (acute blood loss anemia) 01/02/2022  ? SDH (subdural hematoma) ([place]) 12/25/2021  ? Essential hypertension 12/24/2021  ? Subdural hematoma ([place]) 12/18/2021  ? Diverticulitis 05/27/2019  ? Unsteady gait 12/27/2018  ? PAF (paroxysmal atrial fibrillation) ([place]) 11/14/2015  ? Encounter for therapeutic drug monitoring 09/29/2015  ? Chronic diastolic (congestive) heart failure (HCC)   ? SOB (shortness of breath) 08/17/2015  ? Angina pectoris ([place]) 08/17/2015  ? Low back pain 08/12/2015  ? Sinus tachycardia 01/06/2014  ? Osteoarthritis of both knees 01/06/2014  ? Aortic valve stenosis 11/27/2012  ? SVT (supraventricular tachycardia) ([place]) 11/27/2012  ? Syncope 11/27/2012  ? ?[place] L. Celisse Ciulla, M.A. CCC-SLP ?Adult-based  Speech Language Pathologist ?[place] ?[phone] ? ? ?Dalbert Batman, CCC-SLP ?01/31/2022, 4:18 PM ? ?[place] [facility] [facility]

## 2022-02-05 ENCOUNTER — Ambulatory Visit: Payer: PPO | Admitting: Speech Pathology

## 2022-02-05 ENCOUNTER — Encounter: Payer: PPO | Admitting: Occupational Therapy

## 2022-02-05 DIAGNOSIS — S065XAA Traumatic subdural hemorrhage with loss of consciousness status unknown, initial encounter: Secondary | ICD-10-CM

## 2022-02-05 DIAGNOSIS — R41841 Cognitive communication deficit: Secondary | ICD-10-CM

## 2022-02-05 DIAGNOSIS — Z9889 Other specified postprocedural states: Secondary | ICD-10-CM

## 2022-02-05 NOTE — Patient Instructions (Signed)
Bring in list of her medicines, bring in a book of her choice ?

## 2022-02-05 NOTE — Therapy (Signed)
[place] ?[facility] ?[place], [place], [address] ?Phone: [phone]   Fax:  [phone] ? ?Speech Language Pathology Treatment ? ?Patient Details  ?Name: Kayla Gray ?MRN: [id number] ?Date of Birth: 07-02-1936 ?Referring Provider (SLP): Reesa Chew, PA (CIR) ? ? ?Encounter Date: 02/05/2022 ? ? End of Session - 02/05/22 1647   ? ? Visit Number 3   ? Number of Visits 25   ? Date for SLP Re-Evaluation 04/19/22   ? Authorization Type [employer]   ? Authorization Time Period 01/24/2022 thru 04/19/2022   ? Authorization - Visit Number 3   ? Progress Note Due on Visit 10   ? SLP Start Time 1500   ? SLP Stop Time  1600   ? SLP Time Calculation (min) 60 min   ? Activity Tolerance Patient tolerated treatment well   ? ?  ?  ? ?  ? ? ?Past Medical History:  ?Diagnosis Date  ? Aortic stenosis   ? Bilateral cataracts   ? Bleeding nose   ? Thurs night (09/08/15) and Fri (09/09/15) left side.Bleeding didn't last long  ? Breast cancer ([place]) 1999  ? right breast lumpectomy and rad tx  ? Carotid arterial disease ([place])   ? a. 12/2018 Carotid U/S: RICA <93%, LICA 71-69%.  ? Chronic diastolic (congestive) heart failure (HCC)   ? Depression   ? Diverticulosis   ? Gastroesophageal reflux disease   ? Heart murmur   ? History of cardiac catheterization   ? a. 08/2015 Cath: nl cors. Sev AS (Peak grad 24mHg).  ? Hyperlipidemia   ? Hypertension   ? Iron deficiency anemia   ? Obstructive sleep apnea   ? Osteoarthritis   ? Osteopenia   ? Peptic ulcer disease   ? Personal history of radiation therapy   ? PSVT (paroxysmal supraventricular tachycardia) ([facility]   ? S/P aortic valve replacement with bioprosthetic valve 09/16/2015  ? a. 09/2015 s/p 23 mm Edwards Intuity Elite bioprosthetic tissue valve; b. 12/2021 Echo: EF 60-65%, no rwma, GrI DD, nl RV fxn. Mild MR. Nl fxn'ing AoV prosthesis.  ? Skin cancer 2020  ? Stroke ([facility]   ? 11/14/13  ? Subdural hematoma (HCC)   ? a.  12/2021 fall-->R subdural hematoma w/ midlilne shift-->s/p craniotomy.  ? Supra[place]ricular tachycardia ([facility]   ? Syncope and collapse   ? Urinary tract infection   ? ? ?Past Surgical History:  ?Procedure Laterality Date  ? AORTIC VALVE REPLACEMENT N/A 09/16/2015  ? Procedure: AORTIC VALVE REPLACEMENT (AVR);  Surgeon: CRexene Alberts MD;  [facility] [facility]  Service: Open Heart Surgery;  Laterality: N/A;  ? BREAST BIOPSY Right 1999  ? breast ca radation  ? BREAST EXCISIONAL BIOPSY Left yrs ago   ? benign  ? BREAST LUMPECTOMY Right 1999  ? with radiation  ? CARDIAC CATHETERIZATION N/A 08/17/2015  ? Procedure: Left Heart Cath and Coronary Angiography;  Surgeon: TMinna Merritts MD;  [facility];  Service: Cardiovascular;  Laterality: N/A;  ? CATARACT EXTRACTION, BILATERAL    ? CHOLECYSTECTOMY    ? CRANIOTOMY Right 12/18/2021  ? Procedure: CRANIOTOMY HEMATOMA EVACUATION SUBDURAL;  Surgeon: YMeade Maw MD;  Location: [facility];  Service: Neurosurgery;  Laterality: Right;  ? JOINT REPLACEMENT Bilateral   ? knee replacement  ? KNEE ARTHROSCOPY    ? right  ? KNEE ARTHROSCOPY    ? left  ? SKIN CANCER EXCISION  2020  ? TEE  WITHOUT CARDIOVERSION N/A 09/16/2015  ? Procedure: TRANSESOPHAGEAL ECHOCARDIOGRAM (TEE);  Surgeon: Rexene Alberts, MD;  Location: [facility];  Service: Open Heart Surgery;  Laterality: N/A;  ? TONSILLECTOMY    ? VAGINAL HYSTERECTOMY    ? ? ?There were no vitals filed for this visit. ? ? Subjective Assessment - 02/05/22 1623   ? ? Subjective pt attended session by herself, her niece wanted in the reception area   ? Currently in Pain? No/denies   ? ?  ?  ? ?  ? ? ? ? ? ? ? ? ADULT SLP TREATMENT - 02/05/22 0001   ? ?  ? Treatment Provided  ? Treatment provided Cognitive-Linquistic   ?  ? Cognitive-Linquistic Treatment  ? Treatment focused on Cognition;Patient/family/caregiver education   ? Skilled Treatment Skilled treatment session focused on pt's cognitive impairments. SLP facilitated session  by providing pt a handout listing compensatory memory strategies. While pt reports that she is doing these strategies, we have no way to confirm this as the niece who brings pt to therapy isn't with pt throughout the day. SLP worked with pt to facilitate description of her current daily routine as well the ADLs that her caregivers are completing. In addition, this Probation officer called pt's daughter to discuss the possibility of caregivers implementing recommendations for following a routine or completing a checklist to gain better understanding of pt's abilities within home environment.   ? ?  ?  ? ?  ? ? ? SLP Education - 02/05/22 1645   ? ? Education Details establishing a routine/schedule, activities to engage in to promote cognitive functioning   ? Person(s) Educated Patient   pt's daughter via phone  ? Methods Explanation;Demonstration;Handout;Verbal cues   ? Comprehension Need further instruction   ? ?  ?  ? ?  ? ? ? SLP Short Term Goals - 01/25/22 1557   ? ?  ? SLP SHORT TERM GOAL #1  ? Title With moderate assistance, pt will a) recall and b) demonstrate use of at least three external memory strategies.   ? Baseline new goal   ? Time 10   ? Period --   sessions  ? Status New   ?  ? SLP SHORT TERM GOAL #2  ? Title With moderate assistance, pt will sequence daily household tasks with > 90% accuracy.   ? Baseline new goal   ? Time 10   ? Period --   sessions  ? Status New   ?  ? SLP SHORT TERM GOAL #3  ? Title Given minimal assistance, pt will complete complex problem solving tasks with 80% accuracy.   ? Baseline moderate assistance required   ? Time 10   ? Period --   sessions  ? Status New   ? ?  ?  ? ?  ? ? ? SLP Long Term Goals - 01/25/22 1559   ? ?  ? SLP LONG TERM GOAL #1  ? Title Patient will demonstrate improved cognitive linguistic function for supervised completion of iADLS tasks in home/community environments   ? Baseline moderate assistance required   ? Time 12   ? Period Weeks   ? Status New   ? Target  Date 04/19/22   ? ?  ?  ? ?  ? ? ? Plan - 02/05/22 1647   ? ? Clinical Impression Statement Pt presents with moderate cognitive communication impairment with mild deficits in selective attention, emergent awareness and safety awareness as well as moderate  deficits in complex problem solving, recall of functional daily information and severe executive function. Intervention targeted cog/com deficits per data above with initiation of training of compensatory strategies attention and problem solving. Patient benefits from moderate level contextual cues for improved accuracy and function. Skilled ST intervention is required to improve moderate deficits toward baseline function and for increased independence in the home/community environment   ? Speech Therapy Frequency 2x / week   ? Duration 12 weeks   ? Treatment/Interventions Environmental controls;Compensatory strategies;Cueing hierarchy;Internal/external aids;Functional tasks;SLP instruction and feedback;Patient/family education   ? Potential to Achieve Goals Good   ? Potential Considerations Ability to learn/carryover information;Severity of impairments;Family/community support   ? SLP Home Exercise Plan provided, see pt instructions section   ? Consulted and Agree with Plan of Care Patient;Family member/caregiver   ? Family Member Consulted pt's daughter via phone   ? ?  ?  ? ?  ? ? ?Patient will benefit from skilled therapeutic intervention in order to improve the following deficits and impairments:   ?Cognitive communication deficit ? ?SDH (subdural hematoma) (HCC) ? ?S/P craniotomy ? ? ? ?Problem List ?Patient Active Problem List  ? Diagnosis Date Noted  ? Dysphagia 01/02/2022  ? ABLA (acute blood loss anemia) 01/02/2022  ? SDH (subdural hematoma) ([place]) 12/25/2021  ? Essential hypertension 12/24/2021  ? Subdural hematoma ([place]) 12/18/2021  ? Diverticulitis 05/27/2019  ? Unsteady gait 12/27/2018  ? PAF (paroxysmal atrial fibrillation) ([place]) 11/14/2015  ?  Encounter for therapeutic drug monitoring 09/29/2015  ? Chronic diastolic (congestive) heart failure (HCC)   ? SOB (shortness of breath) 08/17/2015  ? Angina pectoris ([place]) 08/17/2015  ? Low back pain 08/12/2015

## 2022-02-07 ENCOUNTER — Ambulatory Visit: Payer: PPO

## 2022-02-07 ENCOUNTER — Encounter: Payer: PPO | Admitting: Occupational Therapy

## 2022-02-07 ENCOUNTER — Encounter: Payer: PPO | Admitting: Speech Pathology

## 2022-02-07 DIAGNOSIS — R2681 Unsteadiness on feet: Secondary | ICD-10-CM

## 2022-02-07 DIAGNOSIS — S065XAA Traumatic subdural hemorrhage with loss of consciousness status unknown, initial encounter: Secondary | ICD-10-CM

## 2022-02-07 DIAGNOSIS — R262 Difficulty in walking, not elsewhere classified: Secondary | ICD-10-CM

## 2022-02-07 DIAGNOSIS — M6281 Muscle weakness (generalized): Secondary | ICD-10-CM

## 2022-02-07 DIAGNOSIS — R278 Other lack of coordination: Secondary | ICD-10-CM

## 2022-02-07 NOTE — Therapy (Signed)
[place] ?[facility] ?[place], [place], [address] ?Phone: [phone]   Fax:  [phone] ? ?Physical Therapy Treatment ? ?Patient Details  ?Name: Kayla Gray ?MRN: [id number] ?Date of Birth: [birth year] ?Referring Provider (PT): Love, Ivan Anchors, PA-C ? ? ?Encounter Date: 02/07/2022 ? ? PT End of Session - 02/07/22 1527   ? ? Visit Number 2   ? Number of Visits 25   ? Date for PT Re-Evaluation 04/18/22   ? Progress Note Due on Visit 10   ? PT Start Time 1515   ? PT Stop Time 2355   ? PT Time Calculation (min) 44 min   ? Equipment Utilized During Treatment Gait belt   ? Activity Tolerance Patient tolerated treatment well   ? Behavior During Therapy [facility] for tasks assessed/performed   ? ?  ?  ? ?  ? ? ?Past Medical History:  ?Diagnosis Date  ? Aortic stenosis   ? Bilateral cataracts   ? Bleeding nose   ? Thurs night (09/08/15) and Fri (09/09/15) left side.Bleeding didn't last long  ? Breast cancer ([place]) 1999  ? right breast lumpectomy and rad tx  ? Carotid arterial disease ([place])   ? a. 12/2018 Carotid U/S: RICA <73%, LICA 22-02%.  ? Chronic diastolic (congestive) heart failure (HCC)   ? Depression   ? Diverticulosis   ? Gastroesophageal reflux disease   ? Heart murmur   ? History of cardiac catheterization   ? a. 08/2015 Cath: nl cors. Sev AS (Peak grad 41mHg).  ? Hyperlipidemia   ? Hypertension   ? Iron deficiency anemia   ? Obstructive sleep apnea   ? Osteoarthritis   ? Osteopenia   ? Peptic ulcer disease   ? Personal history of radiation therapy   ? PSVT (paroxysmal supraventricular tachycardia) ([place]   ? S/P aortic valve replacement with bioprosthetic valve 09/16/2015  ? a. 09/2015 s/p 23 mm Edwards Intuity Elite bioprosthetic tissue valve; b. 12/2021 Echo: EF 60-65%, no rwma, GrI DD, nl RV fxn. Mild MR. Nl fxn'ing AoV prosthesis.  ? Skin cancer 2020  ? Stroke ([facility]   ? 11/14/13  ? Subdural hematoma (HCC)   ? a. 12/2021 fall-->R subdural hematoma w/  midlilne shift-->s/p craniotomy.  ? [place]ricular [place] ([place]   ? Syncope and collapse   ? Urinary tract infection   ? ? ?Past Surgical History:  ?Procedure Laterality Date  ? AORTIC VALVE REPLACEMENT N/A 09/16/2015  ? Procedure: AORTIC VALVE REPLACEMENT (AVR);  Surgeon: CRexene Alberts MD;  [facility]  Service: Open Heart Surgery;  Laterality: N/A;  ? BREAST BIOPSY Right 1999  ? breast ca radation  ? BREAST EXCISIONAL BIOPSY Left yrs ago   ? benign  ? BREAST LUMPECTOMY Right 1999  ? with radiation  ? CARDIAC CATHETERIZATION N/A 08/17/2015  ? Procedure: Left Heart Cath and Coronary Angiography;  Surgeon: TMinna Merritts MD;  [facility];  Service: Cardiovascular;  Laterality: N/A;  ? CATARACT EXTRACTION, BILATERAL    ? CHOLECYSTECTOMY    ? CRANIOTOMY Right 12/18/2021  ? Procedure: CRANIOTOMY HEMATOMA EVACUATION SUBDURAL;  Surgeon: YMeade Maw MD;  Location: [facility];  Service: Neurosurgery;  Laterality: Right;  ? JOINT REPLACEMENT Bilateral   ? knee replacement  ? KNEE ARTHROSCOPY    ? right  ? KNEE ARTHROSCOPY    ? left  ? SKIN CANCER EXCISION  2020  ? TEE WITHOUT CARDIOVERSION N/A 09/16/2015  ? Procedure:  TRANSESOPHAGEAL ECHOCARDIOGRAM (TEE);  Surgeon: Rexene Alberts, MD;  Location: [facility];  Service: Open Heart Surgery;  Laterality: N/A;  ? TONSILLECTOMY    ? VAGINAL HYSTERECTOMY    ? ? ?There were no vitals filed for this visit. ? ? Subjective Assessment - 02/07/22 1525   ? ? Subjective Patient reports doing okay. States eager to improve to get rid of walker   ? Patient is accompained by: Family member   niece [name].  ? Pertinent History Pt referred to outpatient PT following traumatic subdural hemorrhage due to a fall while mopping (pt unsure if she lost consciousness with fall) on March 6th 2023. Pt underwent craniotomy to evacuate SDH. Pt discharged to CIR before returning to home with family. She is accompanied today by her niece. Pt current symptoms include "slight"  headaches, FOF, and difficulty with balance. She feels most unsteady when she is out, such as at a grocery store. Pt denies pain and dizziness. Pt was using SPC or furniture walking prior to her fall. She currenlty uses a 2WW. She is receiving help at home with bathing, dressing and making her bed. Per chart PMH is significant for the following: aortic stenosis, cataracts, breast CA, chronic diastolic (congestive) heart failure, depression, diverticulosis, HTN, iron deficiency anemia, obstructive sleep apnea, OA, osteopenia, s/p aortic valve replacement with bioprosthetic valve (2016), skin CA, stroke, supraventricular tachycardia, syncope and collapse, UTI   ? Limitations Standing;Walking;Lifting;House hold activities   ? How long can you sit comfortably? not limited   ? How long can you stand comfortably? <10 min due to limited strength/energy   ? How long can you walk comfortably? Pt thinks between 5-10 minutse due to limited strength/energy   ? Diagnostic tests Pt with extensive imaging. Please refer to chart for full details. 3/7: MRI BRAIN WO CONTRAST: ?   IMPRESSION:  1. Small region of acute ischemia in the right superior frontal  gyrus near the vertex corresponding to the finding on the same-day  head CT, and punctate acute infarct in the right insular cortex.  2. Stable postsurgical changes reflecting right frontoparietal  craniotomy for subdural hematoma evacuation. Residual subdural blood  over the right cerebral convexity, layering along the falx  posteriorly, and right tentorial leaflet is overall similar to the  same-day head CT, with minimal mass effect and no measurable midline  shift.  3. Subdural collections are also seen overlying the bilateral  cerebellar hemispheres extending to the craniocervical junction.  Please also see the separately dictated cervical spine MRI.?    3/7 MR cervical spine WO contrast: ? IMPRESSION:  Extramedullary hemorrhage within the upper cervical spinal canal   (extending from the craniocervical junction to the C5 level).  Resultant mild-to-moderate narrowing of the upper cervical spinal  canal (without spinal cord mass effect).     Cervical spondylosis, as outlined and having progressed at multiple  levels since the prior MRI of 05/01/2017.     At C6-C7, there is progressive moderate/advanced disc degeneration  with mild degenerative endplate edema. A posterior disc osteophyte  complex contributes to progressive moderate spinal canal stenosis.  Multifactorial severe bilateral neural foraminal narrowing also  present at this level.     No more than mild degenerative spinal canal narrowing at the  remaining levels.     Multifactorial moderate left neural foraminal narrowing at C5-C6.?   ? Patient Stated Goals Pt would like to be able to walk without using a walker. She would like to feel confident going to  the grocery store.   ? Currently in Pain? No/denies   ? ?  ?  ? ?  ? ? ?INTERVENTIONS:  ?Therapeutic Exercises:  ?LE Strengthening: ?Reviewed patient copy of HEP from CIR today:  ? ?Sit to stand x 10 reps with BUE support and 5 more with 1UE Support ?Seated knee ext ?Standing knee flex ?Standing hip abd  ?Standing mini squats ?2 sets of 10 reps with 2nd set performed with 2.5 lb AW ? ?Heel to toe stance - attempted to hold 10 sec- Patient able to hold approx 5 sec. X multiple attempts each leg ?*instructed her to start more with feet slightly wider and then narrow as she improves.  ?Stairs with 1 rail- up/down using stronger leg- instructed in changing angle to enable use of BUE on railing for safe step negotiation.  ?Gait in clinic without AD- Patient ambulated approx 50 feet with CGA without an AD- short reciprocal steps with mild unsteadiness  ? ?Education provided throughout session via VC/TC and demonstration to facilitate movement at target joints and correct muscle activation for all testing and exercises performed.  ? ? ? ? ? ? ? ? ? ? ? ? ? ? ? ? ? ? ? ? ? ? ? ?  PT Education - 02/07/22 1526   ? ? Education Details Exercise technique   ? Person(s) Educated Patient   ? Methods Explanation;Demonstration;Tactile cues;Verbal cues   ? Comprehension Verbalized understanding;Returned

## 2022-02-07 NOTE — Addendum Note (Signed)
Addended by: Merleen Milliner on: 02/07/2022 08:46 AM ? ? Modules accepted: Orders ? ?

## 2022-02-08 DIAGNOSIS — I4891 Unspecified atrial fibrillation: Secondary | ICD-10-CM | POA: Diagnosis not present

## 2022-02-08 DIAGNOSIS — E785 Hyperlipidemia, unspecified: Secondary | ICD-10-CM | POA: Diagnosis not present

## 2022-02-08 DIAGNOSIS — N3281 Overactive bladder: Secondary | ICD-10-CM | POA: Diagnosis not present

## 2022-02-12 ENCOUNTER — Encounter: Payer: PPO | Admitting: Speech Pathology

## 2022-02-12 ENCOUNTER — Encounter: Payer: PPO | Admitting: Occupational Therapy

## 2022-02-13 ENCOUNTER — Ambulatory Visit: Payer: PPO | Attending: Family Medicine

## 2022-02-13 DIAGNOSIS — R2681 Unsteadiness on feet: Secondary | ICD-10-CM | POA: Diagnosis not present

## 2022-02-13 DIAGNOSIS — R278 Other lack of coordination: Secondary | ICD-10-CM | POA: Diagnosis not present

## 2022-02-13 DIAGNOSIS — R262 Difficulty in walking, not elsewhere classified: Secondary | ICD-10-CM | POA: Insufficient documentation

## 2022-02-13 DIAGNOSIS — S065XAA Traumatic subdural hemorrhage with loss of consciousness status unknown, initial encounter: Secondary | ICD-10-CM | POA: Insufficient documentation

## 2022-02-13 DIAGNOSIS — Z9889 Other specified postprocedural states: Secondary | ICD-10-CM | POA: Insufficient documentation

## 2022-02-13 DIAGNOSIS — R41841 Cognitive communication deficit: Secondary | ICD-10-CM | POA: Diagnosis not present

## 2022-02-13 DIAGNOSIS — M6281 Muscle weakness (generalized): Secondary | ICD-10-CM | POA: Insufficient documentation

## 2022-02-13 NOTE — Therapy (Signed)
[place] [facility] ?[place], [place], [address] ?Phone: [phone]   Fax:  [phone] ? ?Physical Therapy Treatment ? ?Patient Details  ?Name: Kayla Gray ?MRN: [id number] ?Date of Birth: Feb 27, 1936 ?Referring Provider (PT): Love, Ivan Anchors, PA-C ? ? ?Encounter Date: 02/13/2022 ? ? PT End of Session - 02/13/22 1529   ? ? Visit Number 3   ? Number of Visits 25   ? Date for PT Re-Evaluation 04/18/22   ? Progress Note Due on Visit 10   ? PT Start Time 1529   ? PT Stop Time 1615   ? PT Time Calculation (min) 46 min   ? Equipment Utilized During Treatment Gait belt   ? Activity Tolerance Patient tolerated treatment well   ? Behavior During Therapy [facility] for tasks assessed/performed   ? ?  ?  ? ?  ? ? ?Past Medical History:  ?Diagnosis Date  ? Aortic stenosis   ? Bilateral cataracts   ? Bleeding nose   ? Thurs night (09/08/15) and Fri (09/09/15) left side.Bleeding didn't last long  ? Breast cancer ([place]) 1999  ? right breast lumpectomy and rad tx  ? Carotid arterial disease ([place])   ? a. 12/2018 Carotid U/S: RICA <41%, LICA 96-22%.  ? Chronic diastolic (congestive) heart failure (HCC)   ? Depression   ? Diverticulosis   ? Gastroesophageal reflux disease   ? Heart murmur   ? History of cardiac catheterization   ? a. 08/2015 Cath: nl cors. Sev AS (Peak grad 63mHg).  ? Hyperlipidemia   ? Hypertension   ? Iron deficiency anemia   ? Obstructive sleep apnea   ? Osteoarthritis   ? Osteopenia   ? Peptic ulcer disease   ? Personal history of radiation therapy   ? [place] ([place]ox[place]) [place]   ? S/P aortic valve replacement with bioprosthetic valve 09/16/2015  ? a. 09/2015 s/p 23 mm Edwards Intuity Elite bioprosthetic tissue valve; b. 12/2021 Echo: EF 60-65%, no rwma, GrI DD, nl RV fxn. Mild MR. Nl fxn'ing AoV prosthesis.  ? Skin cancer 2020  ? Stroke ([facility]   ? 11/14/13  ? Subdural hematoma (HCC)   ? a. 12/2021 fall-->R subdural hematoma w/ midlilne  shift-->s/p craniotomy.  ? Supraventricular tachycardia ([place]   ? Syncope and collapse   ? Urinary tract infection   ? ? ?Past Surgical History:  ?Procedure Laterality Date  ? AORTIC VALVE REPLACEMENT N/A 09/16/2015  ? Procedure: AORTIC VALVE REPLACEMENT (AVR);  Surgeon: CRexene Alberts MD;  [facility]  Service: Open Heart Surgery;  Laterality: N/A;  ? BREAST BIOPSY Right 1999  ? breast ca radation  ? BREAST EXCISIONAL BIOPSY Left yrs ago   ? benign  ? BREAST LUMPECTOMY Right 1999  ? with radiation  ? CARDIAC CATHETERIZATION N/A 08/17/2015  ? Procedure: Left Heart Cath and Coronary Angiography;  Surgeon: TMinna Merritts MD;  [facility];  Service: Cardiovascular;  Laterality: N/A;  ? CATARACT EXTRACTION, BILATERAL    ? CHOLECYSTECTOMY    ? CRANIOTOMY Right 12/18/2021  ? Procedure: CRANIOTOMY HEMATOMA EVACUATION SUBDURAL;  Surgeon: YMeade Maw MD;  Location: [facility];  Service: Neurosurgery;  Laterality: Right;  ? JOINT REPLACEMENT Bilateral   ? knee replacement  ? KNEE ARTHROSCOPY    ? right  ? KNEE ARTHROSCOPY    ? left  ? SKIN CANCER EXCISION  2020  ? TEE WITHOUT CARDIOVERSION N/A 09/16/2015  ? Procedure:  TRANSESOPHAGEAL ECHOCARDIOGRAM (TEE);  Surgeon: Rexene Alberts, MD;  Location: [facility];  Service: Open Heart Surgery;  Laterality: N/A;  ? TONSILLECTOMY    ? VAGINAL HYSTERECTOMY    ? ? ?There were no vitals filed for this visit. ? ? Subjective Assessment - 02/13/22 1528   ? ? Subjective Patient reports that she is compliant with her HEP and states she feels like she is doing better.   ? Patient is accompained by: Family member   niece [name].  ? Pertinent History Pt referred to outpatient PT following traumatic subdural hemorrhage due to a fall while mopping (pt unsure if she lost consciousness with fall) on March 6th 2023. Pt underwent craniotomy to evacuate SDH. Pt discharged to CIR before returning to home with family. She is accompanied today by her niece. Pt current symptoms  include "slight" headaches, FOF, and difficulty with balance. She feels most unsteady when she is out, such as at a grocery store. Pt denies pain and dizziness. Pt was using SPC or furniture walking prior to her fall. She currenlty uses a 2WW. She is receiving help at home with bathing, dressing and making her bed. Per chart PMH is significant for the following: aortic stenosis, cataracts, breast CA, chronic diastolic (congestive) heart failure, depression, diverticulosis, HTN, iron deficiency anemia, obstructive sleep apnea, OA, osteopenia, s/p aortic valve replacement with bioprosthetic valve (2016), skin CA, stroke, supraventricular tachycardia, syncope and collapse, UTI   ? Limitations Standing;Walking;Lifting;House hold activities   ? How long can you sit comfortably? not limited   ? How long can you stand comfortably? <10 min due to limited strength/energy   ? How long can you walk comfortably? Pt thinks between 5-10 minutse due to limited strength/energy   ? Diagnostic tests Pt with extensive imaging. Please refer to chart for full details. 3/7: MRI BRAIN WO CONTRAST: ?   IMPRESSION:  1. Small region of acute ischemia in the right superior frontal  gyrus near the vertex corresponding to the finding on the same-day  head CT, and punctate acute infarct in the right insular cortex.  2. Stable postsurgical changes reflecting right frontoparietal  craniotomy for subdural hematoma evacuation. Residual subdural blood  over the right cerebral convexity, layering along the falx  posteriorly, and right tentorial leaflet is overall similar to the  same-day head CT, with minimal mass effect and no measurable midline  shift.  3. Subdural collections are also seen overlying the bilateral  cerebellar hemispheres extending to the craniocervical junction.  Please also see the separately dictated cervical spine MRI.?    3/7 MR cervical spine WO contrast: ? IMPRESSION:  Extramedullary hemorrhage within the upper cervical spinal  canal  (extending from the craniocervical junction to the C5 level).  Resultant mild-to-moderate narrowing of the upper cervical spinal  canal (without spinal cord mass effect).     Cervical spondylosis, as outlined and having progressed at multiple  levels since the prior MRI of 05/01/2017.     At C6-C7, there is progressive moderate/advanced disc degeneration  with mild degenerative endplate edema. A posterior disc osteophyte  complex contributes to progressive moderate spinal canal stenosis.  Multifactorial severe bilateral neural foraminal narrowing also  present at this level.     No more than mild degenerative spinal canal narrowing at the  remaining levels.     Multifactorial moderate left neural foraminal narrowing at C5-C6.?   ? Patient Stated Goals Pt would like to be able to walk without using a walker. She would like  to feel confident going to the grocery store.   ? Currently in Pain? No/denies   ? ?  ?  ? ?  ? ? ?INTERVENTIONS:  ? ?Therapeutic Exercises:  ? ? ?Steps - Up/down 4 steps with 1 rail x 2 trials- Able to perform reciprocal steps ? ?Slow dynamic march standing on airex pad x 20 reps each LE- Difficulty with balance requiring 1UE support initially but progressed to No UE support. ? ?Static stand on airex pad x 30 sec with feet together ?Static stand on airex pad with feet together- dynamic head turn/nods x 10 reps each ?Static stand  on airex pad with dynamic head turns with eyes closed x 6. ? ?Dynamic lateral step over 1/2 foam to right then back to left- x 10 reps total- attempting without UE support.  ? ?Forward/backward step up/over 1/2 foam x 10 steps ? ? ?Precor leg press: ?BLE at 25 lb. X 12 reps  ? ?Patient ambulated 2x - 30 feet and then another  75 feet today without an AD today.  ? ?Education provided throughout session via VC/TC and demonstration to facilitate movement at target joints and correct muscle activation for all testing and exercises performed.   ? ? ? ? ? ? ? ? ? ? ? ? ? ? ? ? ? ? ? ? ? ? ? PT Education - 02/13/22 1528   ? ? Education Details Exercise technique   ? Person(s) Educated Patient   ? Methods Explanation;Demonstration;Tactile cues;Verbal cues   ? Comprehens

## 2022-02-14 ENCOUNTER — Ambulatory Visit: Payer: PPO | Admitting: Speech Pathology

## 2022-02-14 ENCOUNTER — Encounter: Payer: PPO | Admitting: Occupational Therapy

## 2022-02-14 DIAGNOSIS — R41841 Cognitive communication deficit: Secondary | ICD-10-CM

## 2022-02-14 DIAGNOSIS — S065XAA Traumatic subdural hemorrhage with loss of consciousness status unknown, initial encounter: Secondary | ICD-10-CM | POA: Diagnosis not present

## 2022-02-14 NOTE — Patient Instructions (Signed)
Continue brain activities at home including reading and cognitive games  ?

## 2022-02-14 NOTE — Therapy (Signed)
[place] [facility] ?[place], [place], [address] ?Phone: [phone]   Fax:  [phone] ? ?Speech Language Pathology Treatment ? ?Patient Details  ?Name: Kayla Gray ?MRN: [id number] ?Date of Birth: Jul 16, 1936 ?Referring Provider (SLP): Reesa Chew, PA (CIR) ? ? ?Encounter Date: 02/14/2022 ? ? End of Session - 02/14/22 1628   ? ? Visit Number 4   ? Number of Visits 25   ? Date for SLP Re-Evaluation 04/19/22   ? Authorization Type [employer]   ? Authorization Time Period 01/24/2022 thru 04/19/2022   ? Authorization - Visit Number 4   ? Progress Note Due on Visit 10   ? SLP Start Time 1500   ? SLP Stop Time  1600   ? SLP Time Calculation (min) 60 min   ? Activity Tolerance Patient tolerated treatment well   ? ?  ?  ? ?  ? ? ?Past Medical History:  ?Diagnosis Date  ? Aortic stenosis   ? Bilateral cataracts   ? Bleeding nose   ? Thurs night (09/08/15) and Fri (09/09/15) left side.Bleeding didn't last long  ? Breast cancer ([place]) 1999  ? right breast lumpectomy and rad tx  ? Carotid arterial disease ([place])   ? a. 12/2018 Carotid U/S: RICA <29%, LICA 52-84%.  ? Chronic diastolic (congestive) heart failure (HCC)   ? Depression   ? Diverticulosis   ? Gastroesophageal reflux disease   ? Heart murmur   ? History of cardiac catheterization   ? a. 08/2015 Cath: nl cors. Sev AS (Peak grad 79mHg).  ? Hyperlipidemia   ? Hypertension   ? Iron deficiency anemia   ? Obstructive sleep apnea   ? Osteoarthritis   ? Osteopenia   ? Peptic ulcer disease   ? Personal history of radiation therapy   ? PSVT (paroxysmal [place]ventricular tachycardia) ([place]   ? S/P aortic valve replacement with bioprosthetic valve 09/16/2015  ? a. 09/2015 s/p 23 mm Edwards Intuity Elite bioprosthetic tissue valve; b. 12/2021 Echo: EF 60-65%, no rwma, GrI DD, nl RV fxn. Mild MR. Nl fxn'ing AoV prosthesis.  ? Skin cancer 2020  ? Stroke [facility]   ? 11/14/13  ? Subdural hematoma (HCC)   ? a.  12/2021 fall-->R subdural hematoma w/ midlilne shift-->s/p craniotomy.  ? Supraventricular tachycardia ([facility]   ? Syncope and collapse   ? Urinary tract infection   ? ? ?Past Surgical History:  ?Procedure Laterality Date  ? AORTIC VALVE REPLACEMENT N/A 09/16/2015  ? Procedure: AORTIC VALVE REPLACEMENT (AVR);  Surgeon: CRexene Alberts MD;  [facility]  Service: Open Heart Surgery;  Laterality: N/A;  ? BREAST BIOPSY Right 1999  ? breast ca radation  ? BREAST EXCISIONAL BIOPSY Left yrs ago   ? benign  ? BREAST LUMPECTOMY Right 1999  ? with radiation  ? CARDIAC CATHETERIZATION N/A 08/17/2015  ? Procedure: Left Heart Cath and Coronary Angiography;  Surgeon: TMinna Merritts MD;  [facility];  Service: Cardiovascular;  Laterality: N/A;  ? CATARACT EXTRACTION, BILATERAL    ? CHOLECYSTECTOMY    ? CRANIOTOMY Right 12/18/2021  ? Procedure: CRANIOTOMY HEMATOMA EVACUATION SUBDURAL;  Surgeon: YMeade Maw MD;  Location: [facility];  Service: Neurosurgery;  Laterality: Right;  ? JOINT REPLACEMENT Bilateral   ? knee replacement  ? KNEE ARTHROSCOPY    ? right  ? KNEE ARTHROSCOPY    ? left  ? SKIN CANCER EXCISION  2020  ? TEE  WITHOUT CARDIOVERSION N/A 09/16/2015  ? Procedure: TRANSESOPHAGEAL ECHOCARDIOGRAM (TEE);  Surgeon: Rexene Alberts, MD;  Location: [facility];  Service: Open Heart Surgery;  Laterality: N/A;  ? TONSILLECTOMY    ? VAGINAL HYSTERECTOMY    ? ? ?There were no vitals filed for this visit. ? ? Subjective Assessment - 02/14/22 1613   ? ? Subjective Patient reported doing more brain stimulating activities (checkers) as educated on prior sessions   ? Patient is accompained by: Family member   ? Currently in Pain? No/denies   ? ?  ?  ? ?  ? ? ? ? ? ? ? ? ADULT SLP TREATMENT - 02/14/22 0001   ? ?  ? Treatment Provided  ? Treatment provided Cognitive-Linquistic   ?  ? Cognitive-Linquistic Treatment  ? Treatment focused on Cognition;Patient/family/caregiver education   ? Skilled Treatment Skilled  treatment intervention targeted cognitive impairments in memory and problem solving. SLP re-trained recall strategies: verbal repetition, visualization, written notes prior to challenging patient with recall tasks. Patient demonstrated recall strategy, written notes, for brief functional info (appointments, memos) with 67% accuracy x12 IND, imroved to 100% accuracy with moderate assist via F2. Patient demonstrated recall strategies visualize and repetition for verbal directions with 84% accuracy x32, improved to 100% accuracy with SLP breakdown to F2 selection. Patient challenged with utilizing recall strategies for conversational stories with 62% accuracy x29 IND, improved to100% accuracy with contextual cues. Patient was able to later verbalize recall strategies with 33% accuracy x3 trained above (provided only repetition strategy) SLP re-educated and trained. Patient completed simple problem solving tasks for the functional environment by verbalizing solutions to simple scenarios with 60% accuracy x10, improved to 100% with mod assist via leading contextual questions. Given simple scenarios with increased details an back story (simpler task) patient with improved independence in task with 80% accuracy x5 IND, improve to 100% with x1 lead in question.   ?  ? Assessment / Recommendations / Plan  ? Plan Continue with current plan of care   ? ?  ?  ? ?  ? ? ? SLP Education - 02/14/22 1628   ? ? Education Details recall strategies   ? Person(s) Educated Patient   ? Methods Explanation;Demonstration   ? Comprehension Verbalized understanding;Need further instruction   ? ?  ?  ? ?  ? ? ? SLP Short Term Goals - 01/25/22 1557   ? ?  ? SLP SHORT TERM GOAL #1  ? Title With moderate assistance, pt will a) recall and b) demonstrate use of at least three external memory strategies.   ? Baseline new goal   ? Time 10   ? Period --   sessions  ? Status New   ?  ? SLP SHORT TERM GOAL #2  ? Title With moderate assistance, pt will  sequence daily household tasks with > 90% accuracy.   ? Baseline new goal   ? Time 10   ? Period --   sessions  ? Status New   ?  ? SLP SHORT TERM GOAL #3  ? Title Given minimal assistance, pt will complete complex problem solving tasks with 80% accuracy.   ? Baseline moderate assistance required   ? Time 10   ? Period --   sessions  ? Status New   ? ?  ?  ? ?  ? ? ? SLP Long Term Goals - 01/25/22 1559   ? ?  ? SLP LONG TERM GOAL #1  ? Title  Patient will demonstrate improved cognitive linguistic function for supervised completion of iADLS tasks in home/community environments   ? Baseline moderate assistance required   ? Time 12   ? Period Weeks   ? Status New   ? Target Date 04/19/22   ? ?  ?  ? ?  ? ? ? Plan - 02/14/22 1629   ? ? Clinical Impression Statement Pt presents with moderate cognitive communication impairment with mild deficits in selective attention, emergent awareness and safety awareness as well as moderate deficits in complex problem solving, recall of functional daily information and severe executive function. Intervention targeted cog/com deficits per data above with continued training of compensatory strategies for recall/memory. Patient benefits from moderate level contextual cues for improved accuracy and function. Skilled ST intervention is required to improve moderate deficits toward baseline function and for increased independence in the home/community environment   ? Speech Therapy Frequency 2x / week   ? Duration 12 weeks   ? Treatment/Interventions Environmental controls;Compensatory strategies;Cueing hierarchy;Internal/external aids;Functional tasks;SLP instruction and feedback;Patient/family education   ? Potential to Achieve Goals Good   ? Potential Considerations Ability to learn/carryover information;Severity of impairments;Family/community support   ? SLP Home Exercise Plan provided, see pt instructions section   ? Consulted and Agree with Plan of Care Patient;Family member/caregiver    ? ?  ?  ? ?  ? ? ?Patient will benefit from skilled therapeutic intervention in order to improve the following deficits and impairments:   ?Cognitive communication deficit ? ? ? ?Problem List ?Patient Active Pr

## 2022-02-19 ENCOUNTER — Telehealth: Payer: Self-pay | Admitting: *Deleted

## 2022-02-19 ENCOUNTER — Encounter: Payer: PPO | Admitting: Speech Pathology

## 2022-02-19 MED ORDER — METOPROLOL TARTRATE 25 MG PO TABS: 25.0000 mg | ORAL_TABLET | Freq: Two times a day (BID) | ORAL | 0 refills | Status: DC

## 2022-02-19 NOTE — Telephone Encounter (Signed)
?  Pt's daughter is returning call ?

## 2022-02-19 NOTE — Addendum Note (Signed)
Addended by: Darlyne Russian on: 02/19/2022 10:01 AM ? ? Modules accepted: Orders ? ?

## 2022-02-19 NOTE — Telephone Encounter (Signed)
-----   Message from Theora Gianotti, NP sent at 02/19/2022  8:34 AM EDT ----- ?I agree with Thurmond Butts.  Given multiple runs of SVT, I recommend that she increase metoprolol to '25mg'$  BID. ?

## 2022-02-19 NOTE — Telephone Encounter (Signed)
Spoke with pt's dtr (DPR). Notified of event monitor results and providers' recc.  ?Pt's dtr voiced understanding and will relay message to pt.  ?Pt will incr metoprolol tartrate to 25 mg BID.  ?New Rx sent to pharmacy.  ?Dtr has no further questions at this time.  ?Pt will follow up with Dr. [name] as scheduled.  ?

## 2022-02-19 NOTE — Telephone Encounter (Signed)
Attempted to call pt. No answer. Lmtcb.  

## 2022-02-21 ENCOUNTER — Ambulatory Visit: Payer: PPO | Admitting: Speech Pathology

## 2022-02-21 DIAGNOSIS — S065XAA Traumatic subdural hemorrhage with loss of consciousness status unknown, initial encounter: Secondary | ICD-10-CM

## 2022-02-21 DIAGNOSIS — R41841 Cognitive communication deficit: Secondary | ICD-10-CM

## 2022-02-22 NOTE — Therapy (Signed)
[place] ?[facility] ?[place], [place], [address] ?Phone: [phone]   Fax:  [phone] ? ?Speech Language Pathology Treatment ? ?Patient Details  ?Name: Kayla Gray ?MRN: [id number] ?Date of Birth: 17-Apr-1936 ?Referring Provider (SLP): Reesa Chew, PA (CIR) ? ? ?Encounter Date: 02/21/2022 ? ? End of Session - 02/22/22 1735   ? ? Visit Number 5   ? Number of Visits 25   ? Date for SLP Re-Evaluation 04/19/22   ? Authorization Type [employer]   ? Authorization Time Period 01/24/2022 thru 04/19/2022   ? Authorization - Visit Number 5   ? Progress Note Due on Visit 10   ? SLP Start Time 1500   ? SLP Stop Time  1600   ? SLP Time Calculation (min) 60 min   ? Activity Tolerance Patient tolerated treatment well   ? ?  ?  ? ?  ? ? ?Past Medical History:  ?Diagnosis Date  ? Aortic stenosis   ? Bilateral cataracts   ? Bleeding nose   ? Thurs night (09/08/15) and Fri (09/09/15) left side.Bleeding didn't last long  ? Breast cancer ([place]) 1999  ? right breast lumpectomy and rad tx  ? Carotid arterial disease ([place])   ? a. 12/2018 Carotid U/S: RICA <85%, LICA 27-78%.  ? Chronic diastolic (congestive) heart failure (HCC)   ? Depression   ? Diverticulosis   ? Gastroesophageal reflux disease   ? Heart murmur   ? History of cardiac catheterization   ? a. 08/2015 Cath: nl cors. Sev AS (Peak grad 85mHg).  ? Hyperlipidemia   ? Hypertension   ? Iron deficiency anemia   ? Obstructive sleep apnea   ? Osteoarthritis   ? Osteopenia   ? Peptic ulcer disease   ? Personal history of radiation therapy   ? PSVT (paroxysmal supraventricular tachycardia) ([place]   ? S/P aortic valve replacement with bioprosthetic valve 09/16/2015  ? a. 09/2015 s/p 23 mm Edwards Intuity Elite bioprosthetic tissue valve; b. 12/2021 Echo: EF 60-65%, no rwma, GrI DD, nl RV fxn. Mild MR. Nl fxn'ing AoV prosthesis.  ? Skin cancer 2020  ? Stroke ([facility]   ? 11/14/13  ? Subdural hematoma (HCC)   ? a.  12/2021 fall-->R subdural hematoma w/ midlilne shift-->s/p craniotomy.  ? Supraventricular tachycardia ([facility]   ? Syncope and collapse   ? Urinary tract infection   ? ? ?Past Surgical History:  ?Procedure Laterality Date  ? AORTIC VALVE REPLACEMENT N/A 09/16/2015  ? Procedure: AORTIC VALVE REPLACEMENT (AVR);  Surgeon: CRexene Alberts MD;  [facility]  Service: Open Heart Surgery;  Laterality: N/A;  ? BREAST BIOPSY Right 1999  ? breast ca radation  ? BREAST EXCISIONAL BIOPSY Left yrs ago   ? benign  ? BREAST LUMPECTOMY Right 1999  ? with radiation  ? CARDIAC CATHETERIZATION N/A 08/17/2015  ? Procedure: Left Heart Cath and Coronary Angiography;  Surgeon: TMinna Merritts MD;  [facility];  Service: Cardiovascular;  Laterality: N/A;  ? CATARACT EXTRACTION, BILATERAL    ? CHOLECYSTECTOMY    ? CRANIOTOMY Right 12/18/2021  ? Procedure: CRANIOTOMY HEMATOMA EVACUATION SUBDURAL;  Surgeon: YMeade Maw MD;  Location: [facility];  Service: Neurosurgery;  Laterality: Right;  ? JOINT REPLACEMENT Bilateral   ? knee replacement  ? KNEE ARTHROSCOPY    ? right  ? KNEE ARTHROSCOPY    ? left  ? SKIN CANCER EXCISION  2020  ? TEE  WITHOUT CARDIOVERSION N/A 09/16/2015  ? Procedure: TRANSESOPHAGEAL ECHOCARDIOGRAM (TEE);  Surgeon: Rexene Alberts, MD;  Location: [facility];  Service: Open Heart Surgery;  Laterality: N/A;  ? TONSILLECTOMY    ? VAGINAL HYSTERECTOMY    ? ? ?There were no vitals filed for this visit. ? ? Subjective Assessment - 02/22/22 1720   ? ? Subjective pt asked about returning to driving   ? Currently in Pain? No/denies   ? ?  ?  ? ?  ? ? ? ? ? ? ? ? ADULT SLP TREATMENT - 02/22/22 0001   ? ?  ? Treatment Provided  ? Treatment provided Cognitive-Linquistic   ?  ? Cognitive-Linquistic Treatment  ? Treatment focused on Cognition;Patient/family/caregiver education   ? Skilled Treatment Skilled treatment focused on pt's cognition goals. SLP facilitated session by maximal assistance for recall of basic  information provided at beginning of session regarding which physician can allow her to drive. Will assist in reaching out to her physicians as well as possible need to be followed by neurology after discharge from neurosurgery. Given pt's significant memory deficits, slowed processing and decreased awareness of deficits, did recommend to pt that she look into driving school before possible return to driving.   ? ?  ?  ? ?  ? ? ? SLP Education - 02/22/22 1735   ? ? Education Details local driving schools   ? Person(s) Educated Patient   ? Methods Explanation;Demonstration;Handout   ? Comprehension Verbalized understanding;Need further instruction   ? ?  ?  ? ?  ? ? ? SLP Short Term Goals - 01/25/22 1557   ? ?  ? SLP SHORT TERM GOAL #1  ? Title With moderate assistance, pt will a) recall and b) demonstrate use of at least three external memory strategies.   ? Baseline new goal   ? Time 10   ? Period --   sessions  ? Status New   ?  ? SLP SHORT TERM GOAL #2  ? Title With moderate assistance, pt will sequence daily household tasks with > 90% accuracy.   ? Baseline new goal   ? Time 10   ? Period --   sessions  ? Status New   ?  ? SLP SHORT TERM GOAL #3  ? Title Given minimal assistance, pt will complete complex problem solving tasks with 80% accuracy.   ? Baseline moderate assistance required   ? Time 10   ? Period --   sessions  ? Status New   ? ?  ?  ? ?  ? ? ? SLP Long Term Goals - 01/25/22 1559   ? ?  ? SLP LONG TERM GOAL #1  ? Title Patient will demonstrate improved cognitive linguistic function for supervised completion of iADLS tasks in home/community environments   ? Baseline moderate assistance required   ? Time 12   ? Period Weeks   ? Status New   ? Target Date 04/19/22   ? ?  ?  ? ?  ? ? ? Plan - 02/22/22 1735   ? ? Clinical Impression Statement Pt presents with moderate cognitive communication impairment with mild deficits in selective attention, emergent awareness and safety awareness as well as moderate  deficits in complex problem solving, recall of functional daily information and severe executive function. Intervention targeted cog/com deficits per data above with continued training of compensatory strategies for recall/memory. Patient benefits from moderate level contextual cues for improved accuracy and function. Skilled ST  intervention is required to improve moderate deficits toward baseline function and for increased independence in the home/community environment   ? Speech Therapy Frequency 2x / week   ? Duration 12 weeks   ? Treatment/Interventions Environmental controls;Compensatory strategies;Cueing hierarchy;Internal/external aids;Functional tasks;SLP instruction and feedback;Patient/family education   ? Potential to Achieve Goals Good   ? Potential Considerations Ability to learn/carryover information;Severity of impairments;Family/community support   limited ability to attend therapy sessions  ? Consulted and Agree with Plan of Care Patient   ? ?  ?  ? ?  ? ? ?Patient will benefit from skilled therapeutic intervention in order to improve the following deficits and impairments:   ?Cognitive communication deficit ? ?SDH (subdural hematoma) (HCC) ? ? ? ?Problem List ?Patient Active Problem List  ? Diagnosis Date Noted  ? Dysphagia 01/02/2022  ? ABLA (acute blood loss anemia) 01/02/2022  ? SDH (subdural hematoma) ([place]) 12/25/2021  ? Essential hypertension 12/24/2021  ? Subdural hematoma ([place]) 12/18/2021  ? Diverticulitis 05/27/2019  ? Unsteady gait 12/27/2018  ? PAF (paroxysmal atrial fibrillation) ([place]) 11/14/2015  ? Encounter for therapeutic drug monitoring 09/29/2015  ? Chronic diastolic (congestive) heart failure (HCC)   ? SOB (shortness of breath) 08/17/2015  ? Angina pectoris ([place]) 08/17/2015  ? Low back pain 08/12/2015  ? Sinus tachycardia 01/06/2014  ? Osteoarthritis of both knees 01/06/2014  ? Aortic valve stenosis 11/27/2012  ? SVT (supraventricular tachycardia) ([place]) 11/27/2012  ? Syncope  11/27/2012  ? ?Ericson Nafziger B. Rutherford Nail, M.S., CCC-SLP, CBIS ?Speech-Language Pathologist ?Rehabilitation Services ?Office [phone] ? ?[place], [place] ?02/22/2022, 5:37 PM ? ?[place] ?[place]

## 2022-02-27 ENCOUNTER — Ambulatory Visit: Payer: PPO

## 2022-02-27 ENCOUNTER — Encounter: Payer: PPO | Admitting: Occupational Therapy

## 2022-02-27 ENCOUNTER — Ambulatory Visit: Payer: PPO | Admitting: Speech Pathology

## 2022-02-27 DIAGNOSIS — Z9889 Other specified postprocedural states: Secondary | ICD-10-CM

## 2022-02-27 DIAGNOSIS — R278 Other lack of coordination: Secondary | ICD-10-CM

## 2022-02-27 DIAGNOSIS — R2681 Unsteadiness on feet: Secondary | ICD-10-CM

## 2022-02-27 DIAGNOSIS — S065XAA Traumatic subdural hemorrhage with loss of consciousness status unknown, initial encounter: Secondary | ICD-10-CM

## 2022-02-27 DIAGNOSIS — R41841 Cognitive communication deficit: Secondary | ICD-10-CM

## 2022-02-27 DIAGNOSIS — R262 Difficulty in walking, not elsewhere classified: Secondary | ICD-10-CM

## 2022-02-27 DIAGNOSIS — M6281 Muscle weakness (generalized): Secondary | ICD-10-CM

## 2022-02-27 NOTE — Therapy (Signed)
[place] ?[facility] ?[place], [place], [address] ?Phone: [phone]   Fax:  [phone] ? ?Physical Therapy Treatment ? ?Patient Details  ?Name: Kayla Gray ?MRN: [id number] ?Date of Birth: 27-Sep-1936 ?Referring Provider (PT): Love, Ivan Anchors, PA-C ? ? ?Encounter Date: 02/27/2022 ? ? PT End of Session - 02/27/22 1614   ? ? Visit Number 4   ? Number of Visits 25   ? Date for PT Re-Evaluation 04/18/22   ? Authorization Type [employer]   ? Authorization Time Period 01/24/22-04/18/22   ? Progress Note Due on Visit 10   ? PT Start Time 1603   ? PT Stop Time 9470   ? PT Time Calculation (min) 38 min   ? Equipment Utilized During Treatment Gait belt   ? Activity Tolerance Patient tolerated treatment well;No increased pain   ? Behavior During Therapy [facility] for tasks assessed/performed   ? ?  ?  ? ?  ? ? ?Past Medical History:  ?Diagnosis Date  ? Aortic stenosis   ? Bilateral cataracts   ? Bleeding nose   ? Thurs night (09/08/15) and Fri (09/09/15) left side.Bleeding didn't last long  ? Breast cancer ([place]) 1999  ? right breast lumpectomy and rad tx  ? Carotid arterial disease ([place])   ? a. 12/2018 Carotid U/S: RICA <96%, LICA 28-36%.  ? Chronic diastolic (congestive) heart failure (HCC)   ? Depression   ? Diverticulosis   ? Gastroesophageal reflux disease   ? Heart murmur   ? History of cardiac catheterization   ? a. 08/2015 Cath: nl cors. Sev AS (Peak grad 65mHg).  ? Hyperlipidemia   ? Hypertension   ? Iron deficiency anemia   ? Obstructive sleep apnea   ? Osteoarthritis   ? Osteopenia   ? Peptic ulcer disease   ? Personal history of radiation therapy   ? PSVT (paroxysmal supraventricular tachycardia) ([place]   ? S/P aortic valve replacement with bioprosthetic valve 09/16/2015  ? a. 09/2015 s/p 23 mm Edwards Intuity Elite bioprosthetic tissue valve; b. 12/2021 Echo: EF 60-65%, no rwma, GrI DD, nl RV fxn. Mild MR. Nl fxn'ing AoV prosthesis.  ? Skin cancer  2020  ? Stroke ([facility]   ? 11/14/13  ? Subdural hematoma (HCC)   ? a. 12/2021 fall-->R subdural hematoma w/ midlilne shift-->s/p craniotomy.  ? Supraventricular tachycardia ([facility]   ? Syncope and collapse   ? Urinary tract infection   ? ? ?Past Surgical History:  ?Procedure Laterality Date  ? AORTIC VALVE REPLACEMENT N/A 09/16/2015  ? Procedure: AORTIC VALVE REPLACEMENT (AVR);  Surgeon: CRexene Alberts MD;  [facility]  Service: Open Heart Surgery;  Laterality: N/A;  ? BREAST BIOPSY Right 1999  ? breast ca radation  ? BREAST EXCISIONAL BIOPSY Left yrs ago   ? benign  ? BREAST LUMPECTOMY Right 1999  ? with radiation  ? CARDIAC CATHETERIZATION N/A 08/17/2015  ? Procedure: Left Heart Cath and Coronary Angiography;  Surgeon: TMinna Merritts MD;  [facility];  Service: Cardiovascular;  Laterality: N/A;  ? CATARACT EXTRACTION, BILATERAL    ? CHOLECYSTECTOMY    ? CRANIOTOMY Right 12/18/2021  ? Procedure: CRANIOTOMY HEMATOMA EVACUATION SUBDURAL;  Surgeon: YMeade Maw MD;  Location: [facility];  Service: Neurosurgery;  Laterality: Right;  ? JOINT REPLACEMENT Bilateral   ? knee replacement  ? KNEE ARTHROSCOPY    ? right  ? KNEE ARTHROSCOPY    ? left  ?  SKIN CANCER EXCISION  2020  ? TEE WITHOUT CARDIOVERSION N/A 09/16/2015  ? Procedure: TRANSESOPHAGEAL ECHOCARDIOGRAM (TEE);  Surgeon: Rexene Alberts, MD;  Location: [facility];  Service: Open Heart Surgery;  Laterality: N/A;  ? TONSILLECTOMY    ? VAGINAL HYSTERECTOMY    ? ? ?There were no vitals filed for this visit. ? ? Subjective Assessment - 02/27/22 1613   ? ? Subjective Pt doing well today. Just had SLP. She denies any updates since last session. She still has difficulty wth balance walking at home.   ? Pertinent History Pt referred to outpatient PT following traumatic subdural hemorrhage due to a fall while mopping (pt unsure if she lost consciousness with fall) on March 6th 2023. Pt underwent craniotomy to evacuate SDH. Pt discharged to CIR before  returning to home with family. She is accompanied today by her niece. Pt current symptoms include "slight" headaches, FOF, and difficulty with balance. She feels most unsteady when she is out, such as at a grocery store. Pt denies pain and dizziness. Pt was using SPC or furniture walking prior to her fall. She currenlty uses a 2WW. She is receiving help at home with bathing, dressing and making her bed. Per chart PMH is significant for the following: aortic stenosis, cataracts, breast CA, chronic diastolic (congestive) heart failure, depression, diverticulosis, HTN, iron deficiency anemia, obstructive sleep apnea, OA, osteopenia, s/p aortic valve replacement with bioprosthetic valve (2016), skin CA, stroke, supraventricular tachycardia, syncope and collapse, UTI   ? Currently in Pain? Yes   ? Pain Score 5    ? Pain Location --   catrch in left lateral gluteals while up AMB, improves with time up  ? ?  ?  ? ?  ? ? ? ? PT Education - 02/27/22 1625   ? ? Education Details Safety with mobility in home   ? Person(s) Educated Patient   ? Methods Explanation;Demonstration   ? Comprehension Verbalized understanding;Need further instruction   ? ?  ?  ? ?  ? ? ? PT Short Term Goals - 01/25/22 1331   ? ?  ? PT SHORT TERM GOAL #1  ? Title Patient will be independent in home exercise program to improve strength/mobility for better functional independence with ADLs.   ? Time 6   ? Period Weeks   ? Status New   ? Target Date 03/07/22   ? ?  ?  ? ?  ? ? ? ? PT Long Term Goals - 01/25/22 1331   ? ?  ? PT LONG TERM GOAL #1  ? Title Patient will increase FOTO score to equal to or greater than  63  to demonstrate statistically significant improvement in mobility and quality of life.   ? Baseline 4/12: 45   ? Time 12   ? Period Weeks   ? Status New   ? Target Date 04/18/22   ?  ? PT LONG TERM GOAL #2  ? Title Pt will improve BERG by at least 3 points in order to demonstrate clinically significant improvement in balance.   ? Baseline  4/12: 40/56   ? Time 12   ? Period Weeks   ? Status New   ? Target Date 04/18/22   ?  ? PT LONG TERM GOAL #3  ? Title Pt will decrease 5TSTS by at least 3 seconds in order to demonstrate clinically significant improvement in LE strength.   ? Baseline 4/12: 21.28 sec use of BUEs   ? Time  12   ? Period Weeks   ? Status New   ? Target Date 04/18/22   ?  ? PT LONG TERM GOAL #4  ? Title Patient will increase 10 meter walk test to >1.34ms as to improve gait speed for better community ambulation and to reduce fall risk.   ? Baseline 4/12: 0.6 m/s with 2WW   ? Time 12   ? Period Weeks   ? Status New   ? Target Date 04/18/22   ?  ? PT LONG TERM GOAL #5  ? Title Patient will increase ABC scale score >60% to demonstrate better functional mobility and better confidence with ADLs.   ? Baseline 4/12: 25%   ? Time 12   ? Period Weeks   ? Status New   ? Target Date 04/18/22   ? ?  ?  ? ?  ? ?INTERVENTIONS:  ?  ? ?-Overground AMB [id number] ?-AMB circles around equipment in gym x6, no device ?-FWD stepping in agility ladder 2 round trips, pattern ad lib c cues to avoid stepping on ladder (1 LOB)  ?-lateral side stepping in ladder 2 round trips (cues to avoid rotating trunk)  ?-retro AMB overground [id number] guard Assist  ? ?-6xSTS hands on knees, from chair ?-10x STS from chair + airex hands on knees ? ? ? ? ? ? Plan - 02/27/22 1625   ? ? Clinical Impression Statement Continued with gait and step training. Pt has some new pain in her hip, but this is not limiting within session. Pt adequatel ychallenged with stepping accuracy activitties this date.   ? Personal Factors and Comorbidities Age;Comorbidity 3+;Fitness;Sex;Past/Current Experience   ? Comorbidities PMH: aortic stenosis, cataracts, breast CA, chronic diastolic (congestive) heart failure, depression, diverticulosis, HTN, iron deficiency anemia, osbstructive sleep apnea, OA, osteopenia, s/p aortic valve replacement with bioprosthetic valve (2016), skin CA, stroke,  supraventricular tachycardia, syncope and collapse, UTI   ? Examination-Activity Limitations Bathing;Bend;Dressing;Transfers;Stairs;Stand;Locomotion Level;Lift;Carry;Reach Overhead;Squat;Continence   ? Examination-Part

## 2022-02-27 NOTE — Therapy (Signed)
[place] [facility] ?[facility] ?[place], [place], [address] ?Phone: [phone]   Fax:  [phone] ? ?Speech Language Pathology Treatment ? ?Patient Details  ?Name: Kayla Gray ?MRN: [id number] ?Date of Birth: 05/10/1936 ?Referring Provider (SLP): Reesa Chew, PA (CIR) ? ? ?Encounter Date: 02/27/2022 ? ? End of Session - 02/27/22 1707   ? ? Visit Number 6   ? Number of Visits 25   ? Date for SLP Re-Evaluation 04/19/22   ? Authorization Type [employer]   ? Authorization Time Period 01/24/2022 thru 04/19/2022   ? Authorization - Visit Number 6   ? Progress Note Due on Visit 10   ? SLP Start Time 1500   ? SLP Stop Time  1600   ? SLP Time Calculation (min) 60 min   ? Activity Tolerance Patient tolerated treatment well   ? ?  ?  ? ?  ? ? ?Past Medical History:  ?Diagnosis Date  ? Aortic stenosis   ? Bilateral cataracts   ? Bleeding nose   ? Thurs night (09/08/15) and Fri (09/09/15) left side.Bleeding didn't last long  ? Breast cancer ([place]) 1999  ? right breast lumpectomy and rad tx  ? Carotid arterial disease ([place])   ? a. 12/2018 Carotid U/S: RICA <42%, LICA 68-34%.  ? Chronic diastolic (congestive) heart failure (HCC)   ? Depression   ? Diverticulosis   ? Gastroesophageal reflux disease   ? Heart murmur   ? History of cardiac catheterization   ? a. 08/2015 Cath: nl cors. Sev AS (Peak grad 38mHg).  ? Hyperlipidemia   ? Hypertension   ? Iron deficiency anemia   ? Obstructive sleep apnea   ? Osteoarthritis   ? Osteopenia   ? Peptic ulcer disease   ? Personal history of radiation therapy   ? [place] ([place]ox[place]) [place]   ? S/P aortic valve replacement with bioprosthetic valve 09/16/2015  ? a. 09/2015 s/p 23 mm Edwards Intuity Elite bioprosthetic tissue valve; b. 12/2021 Echo: EF 60-65%, no rwma, GrI DD, nl RV fxn. Mild MR. Nl fxn'ing AoV prosthesis.  ? Skin cancer 2020  ? Stroke ([facility]   ? 11/14/13  ? Subdural hematoma (HCC)   ? a.  12/2021 fall-->R subdural hematoma w/ midlilne shift-->s/p craniotomy.  ? [place]   ? Syncope and collapse   ? Urinary tract infection   ? ? ?Past Surgical History:  ?Procedure Laterality Date  ? AORTIC VALVE REPLACEMENT N/A 09/16/2015  ? Procedure: AORTIC VALVE REPLACEMENT (AVR);  Surgeon: CRexene Alberts MD;  [facility]  Service: Open Heart Surgery;  Laterality: N/A;  ? BREAST BIOPSY Right 1999  ? breast ca radation  ? BREAST EXCISIONAL BIOPSY Left yrs ago   ? benign  ? BREAST LUMPECTOMY Right 1999  ? with radiation  ? CARDIAC CATHETERIZATION N/A 08/17/2015  ? Procedure: Left Heart Cath and Coronary Angiography;  Surgeon: TMinna Merritts MD;  [facility];  Service: Cardiovascular;  Laterality: N/A;  ? CATARACT EXTRACTION, BILATERAL    ? CHOLECYSTECTOMY    ? CRANIOTOMY Right 12/18/2021  ? Procedure: CRANIOTOMY HEMATOMA EVACUATION SUBDURAL;  Surgeon: YMeade Maw MD;  Location: [facility];  Service: Neurosurgery;  Laterality: Right;  ? JOINT REPLACEMENT Bilateral   ? knee replacement  ? KNEE ARTHROSCOPY    ? right  ? KNEE ARTHROSCOPY    ? left  ? SKIN CANCER EXCISION  2020  ? TEE  WITHOUT CARDIOVERSION N/A 09/16/2015  ? Procedure: TRANSESOPHAGEAL ECHOCARDIOGRAM (TEE);  Surgeon: Rexene Alberts, MD;  Location: [facility];  Service: Open Heart Surgery;  Laterality: N/A;  ? TONSILLECTOMY    ? VAGINAL HYSTERECTOMY    ? ? ?There were no vitals filed for this visit. ? ? Subjective Assessment - 02/27/22 1706   ? ? Subjective tomorrow is the last day that pt will have paid caregiver   ? Currently in Pain? No/denies   ? ?  ?  ? ?  ? ? ? ? ? ? ? ? ADULT SLP TREATMENT - 02/27/22 0001   ? ?  ? Treatment Provided  ? Treatment provided Cognitive-Linquistic   ?  ? Cognitive-Linquistic Treatment  ? Treatment focused on Cognition;Patient/family/caregiver education   ? Skilled Treatment Skilled treatment session focused on pt's cognitive impairments with focus on recall, divided  attention and strategy generation for problem solving.      ? ?SLP facilitated session by providing the following interventions:     ? ?Pt has goal of driving. The TRIAL MAKING TEST (TMT) Parts A & B were administered to assess divided attention. Both parts of the Trail Making Test consist of 25 circles distributed over a sheet of paper. In Part A, the circles are numbered 1 - 25, and the patient should draw lines to connect the numbers in ascending order. In Part B, the circles include both numbers (1 - 13) and letters (A - L); as in Part A, the patient draws lines to connect the circles in an ascending pattern, but with the added task of alternating between the numbers and letters (i.e., 1-A-2-B-3-C, etc.).      ? ?Results for both TMT A and B are reported as the number of seconds required to complete the task; therefore, higher scores reveal greater impairment.     ? ?Results:    ?Trail A - DEFICIENT - 80 Seconds (n=29 seconds; Deficient > 78 seconds)   ?Trail B - DEFICIENT - 295 seconds (n= 75 seconds; Deficient > 273 seconds)     ? ?The Hopkins Verbal Learning Test was also administered. The HVLT-R is a list learning test, which consists of 12 nouns within three semantic groups. The test has three learning trials in which the administrator reads the words aloud and then asks the patient to repeat as many as he/she can remember in any order. The three learning trials are used to calculate a Total Recall Score and Recognition Discrimination Index (RDI).     ? ?Short version  Hopkins Verbal Learning Test Form 1; immediate recall:  ?TRIAL 1: 3 (norm 7.17) ?TRIAL 2: 3 (norm 9.17)  ?TRIAL 3: 4 (norm 9.88)  ?Recognition 10/12. False positives: 0.  ?Discrimination index: True positives: 10 (norm 11.88) False positives: 0 (norm 0.59).     ? ?Constant Therapy Clinician App was utilized to target attention tasks.   ?Remember the Right Card: Level 1 - 95%; Level 2 - 92%   ?Choose Which Direction: Level 1 - 100%   ?Divided  attention: 16/20   ? ?  ?  ? ?  ? ? ? SLP Education - 02/27/22 1707   ? ? Education Details cognitive impairments and impact on driving   ? Person(s) Educated Patient   ? Methods Explanation;Demonstration;Verbal cues   ? Comprehension Verbalized understanding;Need further instruction   ? ?  ?  ? ?  ? ? ? SLP Short Term Goals - 01/25/22 1557   ? ?  ?  SLP SHORT TERM GOAL #1  ? Title With moderate assistance, pt will a) recall and b) demonstrate use of at least three external memory strategies.   ? Baseline new goal   ? Time 10   ? Period --   sessions  ? Status New   ?  ? SLP SHORT TERM GOAL #2  ? Title With moderate assistance, pt will sequence daily household tasks with > 90% accuracy.   ? Baseline new goal   ? Time 10   ? Period --   sessions  ? Status New   ?  ? SLP SHORT TERM GOAL #3  ? Title Given minimal assistance, pt will complete complex problem solving tasks with 80% accuracy.   ? Baseline moderate assistance required   ? Time 10   ? Period --   sessions  ? Status New   ? ?  ?  ? ?  ? ? ? SLP Long Term Goals - 01/25/22 1559   ? ?  ? SLP LONG TERM GOAL #1  ? Title Patient will demonstrate improved cognitive linguistic function for supervised completion of iADLS tasks in home/community environments   ? Baseline moderate assistance required   ? Time 12   ? Period Weeks   ? Status New   ? Target Date 04/19/22   ? ?  ?  ? ?  ? ? ? Plan - 02/27/22 1708   ? ? Clinical Impression Statement Pt was surprised by the results of the therapy activities. She voiced understanding that driving would not be recommended based on her cognitive deficits. She voiced enjoyment in using the iPad and immediate success with basic level of attention on Constant Therapy Clinician. Continue to recommend skilled ST intervention to target pt's cognitive impairment to improve functional independence and reduce caregiver burden.   ? Speech Therapy Frequency 2x / week   ? Duration 12 weeks   ? Treatment/Interventions Environmental  controls;Compensatory strategies;Cueing hierarchy;Internal/external aids;Functional tasks;SLP instruction and feedback;Patient/family education   ? Potential to Achieve Goals Good   ? Potential Considerations Ability t

## 2022-02-28 ENCOUNTER — Ambulatory Visit: Payer: PPO | Admitting: Speech Pathology

## 2022-02-28 ENCOUNTER — Ambulatory Visit: Payer: PPO

## 2022-02-28 DIAGNOSIS — R41841 Cognitive communication deficit: Secondary | ICD-10-CM

## 2022-02-28 DIAGNOSIS — S065XAA Traumatic subdural hemorrhage with loss of consciousness status unknown, initial encounter: Secondary | ICD-10-CM | POA: Diagnosis not present

## 2022-02-28 DIAGNOSIS — M6281 Muscle weakness (generalized): Secondary | ICD-10-CM

## 2022-02-28 DIAGNOSIS — R278 Other lack of coordination: Secondary | ICD-10-CM

## 2022-02-28 DIAGNOSIS — R262 Difficulty in walking, not elsewhere classified: Secondary | ICD-10-CM

## 2022-02-28 DIAGNOSIS — R2681 Unsteadiness on feet: Secondary | ICD-10-CM

## 2022-02-28 NOTE — Therapy (Signed)
[place] ?[facility] ?[place], [place], [address] ?Phone: [phone]   Fax:  [phone] ? ?Physical Therapy Treatment ? ?Patient Details  ?Name: Kayla Gray ?MRN: [id number] ?Date of Birth: 09-21-1936 ?Referring Provider (PT): Love, Ivan Anchors, PA-C ? ? ?Encounter Date: 02/28/2022 ? ? PT End of Session - 02/28/22 1631   ? ? Visit Number 5   ? Number of Visits 25   ? Date for PT Re-Evaluation 04/18/22   ? Authorization Type [employer]   ? Authorization Time Period 01/24/22-04/18/22   ? Progress Note Due on Visit 10   ? PT Start Time 1602   ? PT Stop Time 1642   ? PT Time Calculation (min) 40 min   ? Activity Tolerance Patient tolerated treatment well   ? Behavior During Therapy [facility] for tasks assessed/performed   ? ?  ?  ? ?  ? ? ?Past Medical History:  ?Diagnosis Date  ? Aortic stenosis   ? Bilateral cataracts   ? Bleeding nose   ? Thurs night (09/08/15) and Fri (09/09/15) left side.Bleeding didn't last long  ? Breast cancer ([place]) 1999  ? right breast lumpectomy and rad tx  ? Carotid arterial disease ([place])   ? a. 12/2018 Carotid U/S: RICA <69%, LICA 62-95%.  ? Chronic diastolic (congestive) heart failure (HCC)   ? Depression   ? Diverticulosis   ? Gastroesophageal reflux disease   ? Heart murmur   ? History of cardiac catheterization   ? a. 08/2015 Cath: nl cors. Sev AS (Peak grad 48mHg).  ? Hyperlipidemia   ? Hypertension   ? Iron deficiency anemia   ? Obstructive sleep apnea   ? Osteoarthritis   ? Osteopenia   ? Peptic ulcer disease   ? Personal history of radiation therapy   ? [place] ([place]) [place]   ? S/P aortic valve replacement with bioprosthetic valve 09/16/2015  ? a. 09/2015 s/p 23 mm Edwards Intuity Elite bioprosthetic tissue valve; b. 12/2021 Echo: EF 60-65%, no rwma, GrI DD, nl RV fxn. Mild MR. Nl fxn'ing AoV prosthesis.  ? Skin cancer 2020  ? Stroke ([facility]   ? 11/14/13  ? Subdural hematoma (HCC)   ? a.  12/2021 fall-->R subdural hematoma w/ midlilne shift-->s/p craniotomy.  ? [place] tachycardia ([place] [place]   ? Syncope and collapse   ? Urinary tract infection   ? ? ?Past Surgical History:  ?Procedure Laterality Date  ? AORTIC VALVE REPLACEMENT N/A 09/16/2015  ? Procedure: AORTIC VALVE REPLACEMENT (AVR);  Surgeon: CRexene Alberts MD;  [facility]  Service: Open Heart Surgery;  Laterality: N/A;  ? BREAST BIOPSY Right 1999  ? breast ca radation  ? BREAST EXCISIONAL BIOPSY Left yrs ago   ? benign  ? BREAST LUMPECTOMY Right 1999  ? with radiation  ? CARDIAC CATHETERIZATION N/A 08/17/2015  ? Procedure: Left Heart Cath and Coronary Angiography;  Surgeon: TMinna Merritts MD;  [facility];  Service: Cardiovascular;  Laterality: N/A;  ? CATARACT EXTRACTION, BILATERAL    ? CHOLECYSTECTOMY    ? CRANIOTOMY Right 12/18/2021  ? Procedure: CRANIOTOMY HEMATOMA EVACUATION SUBDURAL;  Surgeon: YMeade Maw MD;  Location: [facility];  Service: Neurosurgery;  Laterality: Right;  ? JOINT REPLACEMENT Bilateral   ? knee replacement  ? KNEE ARTHROSCOPY    ? right  ? KNEE ARTHROSCOPY    ? left  ? SKIN CANCER EXCISION  2020  ? TEE WITHOUT  CARDIOVERSION N/A 09/16/2015  ? Procedure: TRANSESOPHAGEAL ECHOCARDIOGRAM (TEE);  Surgeon: Rexene Alberts, MD;  Location: [facility];  Service: Open Heart Surgery;  Laterality: N/A;  ? TONSILLECTOMY    ? VAGINAL HYSTERECTOMY    ? ? ?There were no vitals filed for this visit. ? ?INTERVENTION THIS DATE:  ?-Left glute Medius release 4x30sec, pain with palpation over trochanter initially  ?-P/ROM Left FABER stretch to tolerance  2x30sec  ?-GTB clam 1x15 hooklying ?-Seated bilat hip IR (rainbow ball at knees) 1x15 3lb AW bilat  ?-GTB clam 1x15 seated (cues for equal excursion in range)  ? ?-STS from chair + airex 1x10 (more labored today)  ? ?-cable resistance seated leg press 1x0 bilat 8.5lb Left side, 12.5lb Right (too heavy Rt)  ?-seated LAQ 1x10 @ 3lb bilat  ?-cable resistance seated  leg press 1x0 bilat 8.5lb Left side, 12.5lb Right (too heavy Rt)  ?-seated LAQ 1x10 @ 3lb bilat  ? ?-Lateral side stepping in // bars 3x93f bilat  ? ? ? ? ? ? PT Education - 02/28/22 1635   ? ? Education Details form for leg press exercise   ? Person(s) Educated Patient   ? Methods Explanation;Demonstration   ? Comprehension Verbalized understanding;Need further instruction   ? ?  ?  ? ?  ? ? ? PT Short Term Goals - 01/25/22 1331   ? ?  ? PT SHORT TERM GOAL #1  ? Title Patient will be independent in home exercise program to improve strength/mobility for better functional independence with ADLs.   ? Time 6   ? Period Weeks   ? Status New   ? Target Date 03/07/22   ? ?  ?  ? ?  ? ? ? ? PT Long Term Goals - 01/25/22 1331   ? ?  ? PT LONG TERM GOAL #1  ? Title Patient will increase FOTO score to equal to or greater than  63  to demonstrate statistically significant improvement in mobility and quality of life.   ? Baseline 4/12: 45   ? Time 12   ? Period Weeks   ? Status New   ? Target Date 04/18/22   ?  ? PT LONG TERM GOAL #2  ? Title Pt will improve BERG by at least 3 points in order to demonstrate clinically significant improvement in balance.   ? Baseline 4/12: 40/56   ? Time 12   ? Period Weeks   ? Status New   ? Target Date 04/18/22   ?  ? PT LONG TERM GOAL #3  ? Title Pt will decrease 5TSTS by at least 3 seconds in order to demonstrate clinically significant improvement in LE strength.   ? Baseline 4/12: 21.28 sec use of BUEs   ? Time 12   ? Period Weeks   ? Status New   ? Target Date 04/18/22   ?  ? PT LONG TERM GOAL #4  ? Title Patient will increase 10 meter walk test to >1.054m as to improve gait speed for better community ambulation and to reduce fall risk.   ? Baseline 4/12: 0.6 m/s with 2WW   ? Time 12   ? Period Weeks   ? Status New   ? Target Date 04/18/22   ?  ? PT LONG TERM GOAL #5  ? Title Patient will increase ABC scale score >60% to demonstrate better functional mobility and better confidence with  ADLs.   ? Baseline 4/12: 25%   ? Time  12   ? Period Weeks   ? Status New   ? Target Date 04/18/22   ? ?  ?  ? ?  ? ? ? ? ? ? ? ? Plan - 02/28/22 1635   ? ? Clinical Impression Statement Left hip still a bit painful, exam showing consistent with potential left trochanteric tendiosis and/or bursitis- good response to manual therapy, stretching, and moderate loading. Since previous day had a heavy AMB-balance theme, today focussed more heavily on focal strength deficits. Good tolerance to session. STS more labored today perhaps not fully recovered since yesterday.   ? Personal Factors and Comorbidities Age;Comorbidity 3+;Fitness;Sex;Past/Current Experience   ? Comorbidities PMH: aortic stenosis, cataracts, breast CA, chronic diastolic (congestive) heart failure, depression, diverticulosis, HTN, iron deficiency anemia, osbstructive sleep apnea, OA, osteopenia, s/p aortic valve replacement with bioprosthetic valve (2016), skin CA, stroke, supraventricular tachycardia, syncope and collapse, UTI   ? Examination-Activity Limitations Bathing;Bend;Dressing;Transfers;Stairs;Stand;Locomotion Level;Lift;Carry;Reach Overhead;Squat;Continence   ? Examination-Participation Restrictions Driving;Medication Management;Cleaning;Meal Prep;Yard Work;Community Activity;Shop;Laundry   ? Stability/Clinical Decision Making Evolving/Moderate complexity   ? Clinical Decision Making Moderate   ? Rehab Potential Good   ? PT Frequency 2x / week   ? PT Duration 2 weeks   ? PT Treatment/Interventions ADLs/Self Care Home Management;Biofeedback;Cryotherapy;Canalith Repostioning;Electrical Stimulation;Moist Heat;Traction;Ultrasound;Iontophoresis '4mg'$ /ml Dexamethasone;Parrafin;Contrast Bath;DME Instruction;Gait training;Stair training;Functional mobility training;Therapeutic activities;Therapeutic exercise;Balance training;Neuromuscular re-education;Patient/family education;Orthotic Fit/Training;Manual techniques;Wheelchair mobility training;Passive  range of motion;Dry needling;Energy conservation;Splinting;Taping;Vestibular;Visual/perceptual remediation/compensation;Joint Manipulations   ? PT Home Exercise Plan Reviewed current HEP from CIR today (4/2

## 2022-02-28 NOTE — Patient Instructions (Signed)
Complete 1 word search per day ?

## 2022-02-28 NOTE — Therapy (Signed)
[facility] ?[place], [place], [address] ?Phone: [phone]   Fax:  [phone] ? ?Speech Language Pathology Treatment ? ?Patient Details  ?Name: Kayla Gray ?MRN: [id number] ?Date of Birth: 10-Apr-1936 ?Referring Provider (SLP): Reesa Chew, PA (CIR) ? ? ?Encounter Date: 02/28/2022 ? ? End of Session - 02/28/22 1613   ? ? Visit Number 7   ? Number of Visits 25   ? Date for SLP Re-Evaluation 04/19/22   ? Authorization Type [employer]   ? Authorization Time Period 01/24/2022 thru 04/19/2022   ? Authorization - Visit Number 7   ? Progress Note Due on Visit 10   ? SLP Start Time 1500   ? SLP Stop Time  1600   ? SLP Time Calculation (min) 60 min   ? Activity Tolerance Patient tolerated treatment well   ? ?  ?  ? ?  ? ? ?Past Medical History:  ?Diagnosis Date  ? Aortic stenosis   ? Bilateral cataracts   ? Bleeding nose   ? Thurs night (09/08/15) and Fri (09/09/15) left side.Bleeding didn't last long  ? Breast cancer ([place]) 1999  ? right breast lumpectomy and rad tx  ? Carotid arterial disease ([place])   ? a. 12/2018 Carotid U/S: RICA <69%, LICA 62-95%.  ? Chronic diastolic (congestive) heart failure (HCC)   ? Depression   ? Diverticulosis   ? Gastroesophageal reflux disease   ? Heart murmur   ? History of cardiac catheterization   ? a. 08/2015 Cath: nl cors. Sev AS (Peak grad 72mHg).  ? Hyperlipidemia   ? Hypertension   ? Iron deficiency anemia   ? Obstructive sleep apnea   ? Osteoarthritis   ? Osteopenia   ? Peptic ulcer disease   ? Personal history of radiation therapy   ? PSVT (paroxysmal supraventricular tachycardia) ([place]   ? S/P aortic valve replacement with bioprosthetic valve 09/16/2015  ? a. 09/2015 s/p 23 mm Edwards Intuity Elite bioprosthetic tissue valve; b. 12/2021 Echo: EF 60-65%, no rwma, GrI DD, nl RV fxn. Mild MR. Nl fxn'ing AoV prosthesis.  ? Skin cancer 2020  ? St[facility] ([facility]   ? 11/14/13  ? Subdural hematoma (HCC)   ? a.  12/2021 fall-->R subdural hematoma w/ midlilne shift-->s/p craniotomy.  ? Supraventricular tachycardia ([place]   ? Syncope and collapse   ? Urinary tract infection   ? ? ?Past Surgical History:  ?Procedure Laterality Date  ? AORTIC VALVE REPLACEMENT N/A 09/16/2015  ? Procedure: AORTIC VALVE REPLACEMENT (AVR);  Surgeon: CRexene Alberts MD;  [facility]: [facility]  Service: Open Heart Surgery;  Laterality: N/A;  ? BREAST BIOPSY Right 1999  ? breast ca radation  ? BREAST EXCISIONAL BIOPSY Left yrs ago   ? benign  ? BREAST LUMPECTOMY Right 1999  ? with radiation  ? CARDIAC CATHETERIZATION N/A 08/17/2015  ? Procedure: Left Heart Cath and Coronary Angiography;  Surgeon: TMinna Merritts MD;  [facility];  Service: Cardiovascular;  Laterality: N/A;  ? CATARACT EXTRACTION, BILATERAL    ? CHOLECYSTECTOMY    ? CRANIOTOMY Right 12/18/2021  ? Procedure: CRANIOTOMY HEMATOMA EVACUATION SUBDURAL;  Surgeon: YMeade Maw MD;  Location: [facility];  Service: Neurosurgery;  Laterality: Right;  ? JOINT REPLACEMENT Bilateral   ? knee replacement  ? KNEE ARTHROSCOPY    ? right  ? KNEE ARTHROSCOPY    ? left  ? SKIN CANCER EXCISION  2020  ? TEE  WITHOUT CARDIOVERSION N/A 09/16/2015  ? Procedure: TRANSESOPHAGEAL ECHOCARDIOGRAM (TEE);  Surgeon: Rexene Alberts, MD;  Location: [place];  Service: Open Heart Surgery;  Laterality: N/A;  ? TONSILLECTOMY    ? VAGINAL HYSTERECTOMY    ? ? ?There were no vitals filed for this visit. ? ? Subjective Assessment - 02/28/22 1611   ? ? Subjective "I don't like having therapy two days in a row"   ? Currently in Pain? No/denies   ? ?  ?  ? ?  ? ? ? ? ? ? ? ? ADULT SLP TREATMENT - 02/28/22 0001   ? ?  ? Treatment Provided  ? Treatment provided Cognitive-Linquistic   ?  ? Cognitive-Linquistic Treatment  ? Treatment focused on Cognition;Patient/family/caregiver education   ? Skilled Treatment Skilled treatment session focused on pt's cognitive impairments with focus on recall, divided attention and  strategy generation for problem solving.      ? ?SLP facilitated session by providing the following interventions:     ? ?Problem Solving: SLP facilitated strategy generation for basic 3x3 grid task not to repeat color or shape. Pt instructed in identifying row that only had 1 item missing and fill in that place first. Despite multiple trials (15) pt was not able to carryover this strategy. Pt also struggled to recall the 2 factors (color and shape). As a result, assistance level remained at maximal support across all 15 trials. In addition, SLP provided pt with some word searches from [place]. Pt states that she has always enjoyed doing word searches but she currently only has very difficult ones. The word searches provided by SLP appeared to be more appropriate level of difficulty.   ? ?  ?  ? ?  ? ? ? SLP Education - 02/28/22 1612   ? ? Education Details strategy generation   ? Person(s) Educated Patient   ? Methods Explanation;Demonstration;Tactile cues;Verbal cues   ? Comprehension Need further instruction   ? ?  ?  ? ?  ? ? ? SLP Short Term Goals - 01/25/22 1557   ? ?  ? SLP SHORT TERM GOAL #1  ? Title With moderate assistance, pt will a) recall and b) demonstrate use of at least three external memory strategies.   ? Baseline new goal   ? Time 10   ? Period --   sessions  ? Status New   ?  ? SLP SHORT TERM GOAL #2  ? Title With moderate assistance, pt will sequence daily household tasks with > 90% accuracy.   ? Baseline new goal   ? Time 10   ? Period --   sessions  ? Status New   ?  ? SLP SHORT TERM GOAL #3  ? Title Given minimal assistance, pt will complete complex problem solving tasks with 80% accuracy.   ? Baseline moderate assistance required   ? Time 10   ? Period --   sessions  ? Status New   ? ?  ?  ? ?  ? ? ? SLP Long Term Goals - 01/25/22 1559   ? ?  ? SLP LONG TERM GOAL #1  ? Title Patient will demonstrate improved cognitive linguistic function for supervised completion of iADLS tasks in  home/community environments   ? Baseline moderate assistance required   ? Time 12   ? Period Weeks   ? Status New   ? Target Date 04/19/22   ? ?  ?  ? ?  ? ? ?  Plan - 02/28/22 1613   ? ? Clinical Impression Statement Pt is always very willing to try and while she shows understanding that the tasks are difficult, she doesn't demonstrate any insight into reason tasks are difficult. She is pleasantly agreeable which can give the appearance of understanding when she is actually confused. As a result, skilled ST intervention continues to be required to target pt's cognitive impairment in an effort to increase pt's functional independence, possible return to driving,   ? Speech Therapy Frequency 2x / week   ? Duration 12 weeks   ? Treatment/Interventions Environmental controls;Compensatory strategies;Cueing hierarchy;Internal/external aids;Functional tasks;SLP instruction and feedback;Patient/family education   ? Potential to Achieve Goals Good   ? Potential Considerations Ability to learn/carryover information;Severity of impairments;Family/community support   ? SLP Home Exercise Plan provided, see pt instructions section   ? Consulted and Agree with Plan of Care Patient   ? ?  ?  ? ?  ? ? ?Patient will benefit from skilled therapeutic intervention in order to improve the following deficits and impairments:   ?Cognitive communication deficit ? ?SDH (subdural hematoma) (HCC) ? ? ? ?Problem List ?Patient Active Problem List  ? Diagnosis Date Noted  ? Dysphagia 01/02/2022  ? ABLA (acute blood loss anemia) 01/02/2022  ? SDH (subdural hematoma) ([place]) 12/25/2021  ? Essential hypertension 12/24/2021  ? Subdural hematoma ([place]) 12/18/2021  ? Diverticulitis 05/27/2019  ? Unsteady gait 12/27/2018  ? PAF (paroxysmal atrial fibrillation) ([place]) 11/14/2015  ? Encounter for therapeutic drug monitoring 09/29/2015  ? Chronic diastolic (congestive) heart failure (HCC)   ? SOB (shortness of breath) 08/17/2015  ? Angina pectoris ([place])  08/17/2015  ? Low back pain 08/12/2015  ? Sinus tachycardia 01/06/2014  ? Osteoarthritis of both knees 01/06/2014  ? Aortic valve stenosis 11/27/2012  ? SVT (supraventricular tachycardia) ([place]) 11/27/2012  ? Synco

## 2022-03-06 ENCOUNTER — Ambulatory Visit: Payer: PPO | Admitting: Speech Pathology

## 2022-03-06 ENCOUNTER — Encounter: Payer: PPO | Admitting: Occupational Therapy

## 2022-03-06 ENCOUNTER — Ambulatory Visit: Payer: PPO

## 2022-03-06 DIAGNOSIS — R278 Other lack of coordination: Secondary | ICD-10-CM

## 2022-03-06 DIAGNOSIS — S065XAA Traumatic subdural hemorrhage with loss of consciousness status unknown, initial encounter: Secondary | ICD-10-CM | POA: Diagnosis not present

## 2022-03-06 DIAGNOSIS — R262 Difficulty in walking, not elsewhere classified: Secondary | ICD-10-CM

## 2022-03-06 DIAGNOSIS — R41841 Cognitive communication deficit: Secondary | ICD-10-CM

## 2022-03-06 DIAGNOSIS — M6281 Muscle weakness (generalized): Secondary | ICD-10-CM

## 2022-03-06 NOTE — Therapy (Signed)
[place] [facility] [address] [place], [place], [address] Phone: [phone]   Fax:  [phone]  Physical Therapy Treatment  Patient Details  Name: Kayla Gray MRN: [id number] Date of Birth: May 15, 1936 Referring Provider (PT): Bary Leriche, [place]   Encounter Date: 03/06/2022   PT End of Session - 03/06/22 1658     Visit Number 6    Number of Visits 25    Date for PT Re-Evaluation 04/18/22    Authorization Type [employer]    Authorization Time Period 01/24/22-04/18/22    Progress Note Due on Visit 10    PT Start Time 1602    PT Stop Time 2355    PT Time Calculation (min) 42 min    Equipment Utilized During Treatment Gait belt    Activity Tolerance Patient tolerated treatment well    Behavior During Therapy [facility] for tasks assessed/performed             Past Medical History:  Diagnosis Date   Aortic stenosis    Bilateral cataracts    Bleeding nose    Thurs night (09/08/15) and Fri (09/09/15) left side.Bleeding didn't last long   Breast cancer ([place]) 1999   right breast lumpectomy and rad tx   Carotid arterial disease ([place])    a. 12/2018 Carotid U/S: RICA <73%, LICA 22-02%.   Chronic diastolic (congestive) heart failure (HCC)    Depression    Diverticulosis    Gastroesophageal reflux disease    Heart murmur    History of cardiac catheterization    a. 08/2015 Cath: nl cors. Sev AS (Peak grad 74mHg).   Hyperlipidemia    Hypertension    Iron deficiency anemia    Obstructive sleep apnea    Osteoarthritis    Osteopenia    Peptic ulcer disease    Personal history of radiation therapy    PSVT (paroxysmal supraventricular tachycardia) (HCC)    S/P aortic valve replacement with bioprosthetic valve 09/16/2015   a. 09/2015 s/p 23 mm Edwards Intuity Elite bioprosthetic tissue valve; b. 12/2021 Echo: EF 60-65%, no rwma, GrI DD, nl RV fxn. Mild MR. Nl fxn'ing AoV prosthesis.   Skin cancer 2020   Stroke  ([facility]    11/14/13   Subdural hematoma ([place]    a. 12/2021 fall-->R subdural hematoma w/ midlilne shift-->s/p craniotomy.   Supraventricular tachycardia ([place]    Syncope and collapse    Urinary tract infection     Past Surgical History:  Procedure Laterality Date   AORTIC VALVE REPLACEMENT N/A 09/16/2015   Procedure: AORTIC VALVE REPLACEMENT (AVR);  Surgeon: CRexene Alberts MD;  [facility]  Service: Open Heart Surgery;  Laterality: N/A;   BREAST BIOPSY Right 1999   breast ca radation   BREAST EXCISIONAL BIOPSY Left yrs ago    benign   BREAST LUMPECTOMY Right 1999   with radiation   CARDIAC CATHETERIZATION N/A 08/17/2015   Procedure: Left Heart Cath and Coronary Angiography;  Surgeon: TMinna Merritts MD;  [facility];  Service: Cardiovascular;  Laterality: N/A;   CATARACT EXTRACTION, BILATERAL     CHOLECYSTECTOMY     CRANIOTOMY Right 12/18/2021   Procedure: CRANIOTOMY HEMATOMA EVACUATION SUBDURAL;  Surgeon: YMeade Maw MD;  Location: [facility];  Service: Neurosurgery;  Laterality: Right;   JOINT REPLACEMENT Bilateral    knee replacement   KNEE ARTHROSCOPY     right   KNEE ARTHROSCOPY     left  SKIN CANCER EXCISION  2020   TEE WITHOUT CARDIOVERSION N/A 09/16/2015   Procedure: TRANSESOPHAGEAL ECHOCARDIOGRAM (TEE);  Surgeon: Rexene Alberts, MD;  Location: [place];  Service: Open Heart Surgery;  Laterality: N/A;   TONSILLECTOMY     VAGINAL HYSTERECTOMY      There were no vitals filed for this visit.   Subjective Assessment - 03/06/22 1605     Subjective Pt reports no pain currently. Pt has been using SPC past couple days. She used the cane outside and in her house. She report no LOB/stumbles.    Patient is accompained by: Family member   niece Kayla Gray.   Pertinent History Pt referred to outpatient PT following traumatic subdural hemorrhage due to a fall while mopping (pt unsure if she lost consciousness with fall) on March 6th 2023. Pt underwent  craniotomy to evacuate SDH. Pt discharged to CIR before returning to home with family. She is accompanied today by her niece. Pt current symptoms include "slight" headaches, FOF, and difficulty with balance. She feels most unsteady when she is out, such as at a grocery store. Pt denies pain and dizziness. Pt was using SPC or furniture walking prior to her fall. She currenlty uses a 2WW. She is receiving help at home with bathing, dressing and making her bed. Per chart PMH is significant for the following: aortic stenosis, cataracts, breast CA, chronic diastolic (congestive) heart failure, depression, diverticulosis, HTN, iron deficiency anemia, obstructive sleep apnea, OA, osteopenia, s/p aortic valve replacement with bioprosthetic valve (2016), skin CA, stroke, supraventricular tachycardia, syncope and collapse, UTI    Limitations Standing;Walking;Lifting;House hold activities    How long can you sit comfortably? not limited    How long can you stand comfortably? <10 min due to limited strength/energy    How long can you walk comfortably? Pt thinks between 5-10 minutse due to limited strength/energy    Diagnostic tests Pt with extensive imaging. Please refer to chart for full details. 3/7: MRI BRAIN WO CONTRAST: "   IMPRESSION:  1. Small region of acute ischemia in the right superior frontal  gyrus near the vertex corresponding to the finding on the same-day  head CT, and punctate acute infarct in the right insular cortex.  2. Stable postsurgical changes reflecting right frontoparietal  craniotomy for subdural hematoma evacuation. Residual subdural blood  over the right cerebral convexity, layering along the falx  posteriorly, and right tentorial leaflet is overall similar to the  same-day head CT, with minimal mass effect and no measurable midline  shift.  3. Subdural collections are also seen overlying the bilateral  cerebellar hemispheres extending to the craniocervical junction.  Please also see the  separately dictated cervical spine MRI."    3/7 MR cervical spine WO contrast: " IMPRESSION:  Extramedullary hemorrhage within the upper cervical spinal canal  (extending from the craniocervical junction to the C5 level).  Resultant mild-to-moderate narrowing of the upper cervical spinal  canal (without spinal cord mass effect).     Cervical spondylosis, as outlined and having progressed at multiple  levels since the prior MRI of 05/01/2017.     At C6-C7, there is progressive moderate/advanced disc degeneration  with mild degenerative endplate edema. A posterior disc osteophyte  complex contributes to progressive moderate spinal canal stenosis.  Multifactorial severe bilateral neural foraminal narrowing also  present at this level.     No more than mild degenerative spinal canal narrowing at the  remaining levels.     Multifactorial moderate left neural foraminal narrowing  at C5-C6."    Patient Stated Goals Pt would like to be able to walk without using a walker. She would like to feel confident going to the grocery store.    Currently in Pain? No/denies              INTERVENTIONS:    NMR: Ambulating outside with SPC and CGA over firm surface, thick grass, up and down slopes, over obstacles, with turning, and with addition of head turns (horizontal, vertical, diagonal). Pt required two rest breaks throughout. Reports fatigue but shows no signs of unsteadiness, gait speed observed to be decreased.   Amb over large red mat (compliant surface), without AD but with CGA. Forward/backward, side-to-side and with turns. PT reports challenge improved with reps.   Obstacle course red mat, hurdle, half-foam, airex x several minutes  -progressed to dual cog task 2x through   Education provided throughout session via VC/TC and demonstration to facilitate movement at target joints and correct muscle activation for all testing and exercises performed.       PT Education - 03/06/22 1651     Education  Details exercise technique, gait technique    Person(s) Educated Patient    Methods Explanation;Demonstration;Verbal cues    Comprehension Verbalized understanding;Returned demonstration;Verbal cues required;Need further instruction              PT Short Term Goals - 01/25/22 1331       PT SHORT TERM GOAL #1   Title Patient will be independent in home exercise program to improve strength/mobility for better functional independence with ADLs.    Time 6    Period Weeks    Status New    Target Date 03/07/22               PT Long Term Goals - 01/25/22 1331       PT LONG TERM GOAL #1   Title Patient will increase FOTO score to equal to or greater than  63  to demonstrate statistically significant improvement in mobility and quality of life.    Baseline 4/12: 45    Time 12    Period Weeks    Status New    Target Date 04/18/22      PT LONG TERM GOAL #2   Title Pt will improve BERG by at least 3 points in order to demonstrate clinically significant improvement in balance.    Baseline 4/12: 40/56    Time 12    Period Weeks    Status New    Target Date 04/18/22      PT LONG TERM GOAL #3   Title Pt will decrease 5TSTS by at least 3 seconds in order to demonstrate clinically significant improvement in LE strength.    Baseline 4/12: 21.28 sec use of BUEs    Time 12    Period Weeks    Status New    Target Date 04/18/22      PT LONG TERM GOAL #4   Title Patient will increase 10 meter walk test to >1.83ms as to improve gait speed for better community ambulation and to reduce fall risk.    Baseline 4/12: 0.6 m/s with 2WW    Time 12    Period Weeks    Status New    Target Date 04/18/22      PT LONG TERM GOAL #5   Title Patient will increase ABC scale score >60% to demonstrate better functional mobility and better confidence with ADLs.    Baseline  4/12: 25%    Time 12    Period Weeks    Status New    Target Date 04/18/22                   Plan - 03/06/22  1659     Clinical Impression Statement PT assessed pt's gait with use of SPC (pt transitioning from RW). Pt successfully ambulated outside over firm and compliant surfaces, thick grass, and up/down slopes with visually scanning in multiple planes. Pt exhibited no LOB, but did report some fatigue. The pt then completed an obstacle course in the PT clinic over firm and compliant surfaces, obstacles without an AD but with CGA. Pt continued to exhibit no LOB. She does require occassional cues for heel-toe sequencing. The pt will benefit from further skilled PT to continue to improve gait, balance, strength and mobility.    Personal Factors and Comorbidities Age;Comorbidity 3+;Fitness;Sex;Past/Current Experience    Comorbidities PMH: aortic stenosis, cataracts, breast CA, chronic diastolic (congestive) heart failure, depression, diverticulosis, HTN, iron deficiency anemia, osbstructive sleep apnea, OA, osteopenia, s/p aortic valve replacement with bioprosthetic valve (2016), skin CA, stroke, supraventricular tachycardia, syncope and collapse, UTI    Examination-Activity Limitations Bathing;Bend;Dressing;Transfers;Stairs;Stand;Locomotion Level;Lift;Carry;Reach Overhead;Squat;Continence    Examination-Participation Restrictions Driving;Medication Management;Cleaning;Meal Prep;Yard Work;Community Activity;Shop;Laundry    Stability/Clinical Decision Making Evolving/Moderate complexity    Rehab Potential Good    PT Frequency 2x / week    PT Duration 2 weeks    PT Treatment/Interventions ADLs/Self Care Home Management;Biofeedback;Cryotherapy;Canalith Repostioning;Electrical Stimulation;Moist Heat;Traction;Ultrasound;Iontophoresis '4mg'$ /ml Dexamethasone;Parrafin;Contrast Bath;DME Instruction;Gait training;Stair training;Functional mobility training;Therapeutic activities;Therapeutic exercise;Balance training;Neuromuscular re-education;Patient/family education;Orthotic Fit/Training;Manual techniques;Wheelchair mobility  training;Passive range of motion;Dry needling;Energy conservation;Splinting;Taping;Vestibular;Visual/perceptual remediation/compensation;Joint Manipulations    PT Next Visit Plan strength, endurance, balance, advance HEP    PT Home Exercise Plan Reviewed current HEP from CIR today (02/07/2022); no updates    Consulted and Agree with Plan of Care Patient             Patient will benefit from skilled therapeutic intervention in order to improve the following deficits and impairments:  Abnormal gait, Decreased activity tolerance, Decreased endurance, Decreased strength, Improper body mechanics, Decreased balance, Decreased coordination, Decreased mobility, Difficulty walking, Postural dysfunction  Visit Diagnosis: Difficulty in walking, not elsewhere classified  Other lack of coordination  Muscle weakness (generalized)     Problem List Patient Active Problem List   Diagnosis Date Noted   Dysphagia 01/02/2022   ABLA (acute blood loss anemia) 01/02/2022   SDH (subdural hematoma) ([place]) 12/25/2021   Essential hypertension 12/24/2021   Subdural hematoma ([place]) 12/18/2021   Diverticulitis 05/27/2019   Unsteady gait 12/27/2018   PAF (paroxysmal atrial fibrillation) ([place]) 11/14/2015   Encounter for therapeutic drug monitoring 09/29/2015   Chronic diastolic (congestive) heart failure (HCC)    SOB (shortness of breath) 08/17/2015   Angina pectoris ([place]) 08/17/2015   Low back pain 08/12/2015   Sinus tachycardia 01/06/2014   Osteoarthritis of both knees 01/06/2014   Aortic valve stenosis 11/27/2012   SVT (supraventricular tachycardia) ([place]) 11/27/2012   Syncope 11/27/2012    Zollie Pee, PT 03/06/2022, 5:04 PM  [place] [facility] SERVICES [place], [place], [address] Phone: [phone]   Fax:  [phone]  Name: Kayla Gray MRN: [id number] Date of Birth: 04/16/1936

## 2022-03-07 ENCOUNTER — Ambulatory Visit: Payer: PPO

## 2022-03-07 ENCOUNTER — Encounter: Payer: PPO | Admitting: Occupational Therapy

## 2022-03-07 ENCOUNTER — Ambulatory Visit: Payer: PPO | Admitting: Speech Pathology

## 2022-03-07 DIAGNOSIS — S065XAA Traumatic subdural hemorrhage with loss of consciousness status unknown, initial encounter: Secondary | ICD-10-CM | POA: Diagnosis not present

## 2022-03-07 DIAGNOSIS — M6281 Muscle weakness (generalized): Secondary | ICD-10-CM

## 2022-03-07 DIAGNOSIS — R2681 Unsteadiness on feet: Secondary | ICD-10-CM

## 2022-03-07 DIAGNOSIS — R41841 Cognitive communication deficit: Secondary | ICD-10-CM

## 2022-03-07 DIAGNOSIS — R262 Difficulty in walking, not elsewhere classified: Secondary | ICD-10-CM

## 2022-03-07 NOTE — Therapy (Signed)
[place] MAIN [facility] SERVICES [address] [place], [place], [address] Phone: [phone]   Fax:  [phone]  Physical Therapy Treatment/DISCHARGE   Patient Details  Name: Kayla Gray MRN: [id number] Date of Birth: 12/07/1935 Referring Provider (PT): Bary Leriche, [place]   Encounter Date: 03/07/2022   PT End of Session - 03/07/22 1657     Visit Number 7    Number of Visits 25    Date for PT Re-Evaluation 04/18/22    Authorization Type [employer]    Authorization Time Period 01/24/22-04/18/22    Progress Note Due on Visit 10    PT Start Time 1603    PT Stop Time 1646    PT Time Calculation (min) 43 min    Equipment Utilized During Treatment Gait belt    Activity Tolerance Patient tolerated treatment well    Behavior During Therapy WFL for tasks assessed/performed             Past Medical History:  Diagnosis Date   Aortic stenosis    Bilateral cataracts    Bleeding nose    Thurs night (09/08/15) and Fri (09/09/15) left side.Bleeding didn't last long   Breast cancer ([place]) 1999   right breast lumpectomy and rad tx   Carotid arterial disease ([place])    a. 12/2018 Carotid U/S: RICA <02%, LICA 54-27%.   Chronic diastolic (congestive) heart failure (HCC)    Depression    Diverticulosis    Gastroesophageal reflux disease    Heart murmur    History of cardiac catheterization    a. 08/2015 Cath: nl cors. Sev AS (Peak grad 5mHg).   Hyperlipidemia    Hypertension    Iron deficiency anemia    Obstructive sleep apnea    Osteoarthritis    Osteopenia    Peptic ulcer disease    Personal history of radiation therapy    PSVT (paroxysmal supraventricular tachycardia) (HCC)    S/P aortic valve replacement with bioprosthetic valve 09/16/2015   a. 09/2015 s/p 23 mm Edwards Intuity Elite bioprosthetic tissue valve; b. 12/2021 Echo: EF 60-65%, no rwma, GrI DD, nl RV fxn. Mild MR. Nl fxn'ing AoV prosthesis.   Skin cancer 2020    Stroke ([facility]    11/14/13   [place]oma ([place]    a. 12/2021 fall-->R subdural hematoma w/ midlilne shift-->s/p craniotomy.   Sup[place]ricular [place]cardia ([place]    Syncope and collapse    Urinary tract infection     Past Surgical History:  Procedure Laterality Date   AORTIC VALVE REPLACEMENT N/A 09/16/2015   Procedure: AORTIC VALVE REPLACEMENT (AVR);  Surgeon: CRexene Alberts MD;  [place] [facility]  Service: Open Heart Surgery;  Laterality: N/A;   BREAST BIOPSY Right 1999   breast ca radation   BREAST EXCISIONAL BIOPSY Left yrs ago    benign   BREAST LUMPECTOMY Right 1999   with radiation   CARDIAC CATHETERIZATION N/A 08/17/2015   Procedure: Left Heart Cath and Coronary Angiography;  Surgeon: TMinna Merritts MD;  [facility];  Service: Cardiovascular;  Laterality: N/A;   CATARACT EXTRACTION, BILATERAL     CHOLECYSTECTOMY     CRANIOTOMY Right 12/18/2021   Procedure: CRANIOTOMY HEMATOMA EVACUATION SUBDURAL;  Surgeon: YMeade Maw MD;  Location: [facility] ORS;  Service: Neurosurgery;  Laterality: Right;   JOINT REPLACEMENT Bilateral    knee replacement   KNEE ARTHROSCOPY     right   KNEE ARTHROSCOPY     left  SKIN CANCER EXCISION  2020   TEE WITHOUT CARDIOVERSION N/A 09/16/2015   Procedure: TRANSESOPHAGEAL ECHOCARDIOGRAM (TEE);  Surgeon: Rexene Alberts, MD;  Location: [place];  Service: Open Heart Surgery;  Laterality: N/A;   TONSILLECTOMY     VAGINAL HYSTERECTOMY      There were no vitals filed for this visit.   Subjective Assessment - 03/07/22 1606     Subjective Pt reports no changes from yesterday. She reports no pain.    Patient is accompained by: Family member   niece [name].   Pertinent History Pt referred to outpatient PT following traumatic subdural hemorrhage due to a fall while mopping (pt unsure if she lost consciousness with fall) on March 6th 2023. Pt underwent craniotomy to evacuate SDH. Pt discharged to CIR before returning to home with  family. She is accompanied today by her niece. Pt current symptoms include "slight" headaches, FOF, and difficulty with balance. She feels most unsteady when she is out, such as at a grocery store. Pt denies pain and dizziness. Pt was using SPC or furniture walking prior to her fall. She currenlty uses a 2WW. She is receiving help at home with bathing, dressing and making her bed. Per chart PMH is significant for the following: aortic stenosis, cataracts, breast CA, chronic diastolic (congestive) heart failure, depression, diverticulosis, HTN, iron deficiency anemia, obstructive sleep apnea, OA, osteopenia, s/p aortic valve replacement with bioprosthetic valve (2016), skin CA, stroke, supraventricular tachycardia, syncope and collapse, UTI    Limitations Standing;Walking;Lifting;House hold activities    How long can you sit comfortably? not limited    How long can you stand comfortably? <10 min due to limited strength/energy    How long can you walk comfortably? Pt thinks between 5-10 minutse due to limited strength/energy    Diagnostic tests Pt with extensive imaging. Please refer to chart for full details. 3/7: MRI BRAIN WO CONTRAST: "   IMPRESSION:  1. Small region of acute ischemia in the right superior frontal  gyrus near the vertex corresponding to the finding on the same-day  head CT, and punctate acute infarct in the right insular cortex.  2. Stable postsurgical changes reflecting right frontoparietal  craniotomy for subdural hematoma evacuation. Residual subdural blood  over the right cerebral convexity, layering along the falx  posteriorly, and right tentorial leaflet is overall similar to the  same-day head CT, with minimal mass effect and no measurable midline  shift.  3. Subdural collections are also seen overlying the bilateral  cerebellar hemispheres extending to the craniocervical junction.  Please also see the separately dictated cervical spine MRI."    3/7 MR cervical spine WO contrast: "  IMPRESSION:  Extramedullary hemorrhage within the upper cervical spinal canal  (extending from the craniocervical junction to the C5 level).  Resultant mild-to-moderate narrowing of the upper cervical spinal  canal (without spinal cord mass effect).     Cervical spondylosis, as outlined and having progressed at multiple  levels since the prior MRI of 05/01/2017.     At C6-C7, there is progressive moderate/advanced disc degeneration  with mild degenerative endplate edema. A posterior disc osteophyte  complex contributes to progressive moderate spinal canal stenosis.  Multifactorial severe bilateral neural foraminal narrowing also  present at this level.     No more than mild degenerative spinal canal narrowing at the  remaining levels.     Multifactorial moderate left neural foraminal narrowing at C5-C6."    Patient Stated Goals Pt would like to be able to walk  without using a walker. She would like to feel confident going to the grocery store.    Currently in Pain? No/denies             [facility] PT Assessment - 03/07/22 0001       Berg Balance Test   Sit to Stand Able to stand without using hands and stabilize independently    Standing Unsupported Able to stand safely 2 minutes    Sitting with Back Unsupported but Feet Supported on Floor or Stool Able to sit safely and securely 2 minutes    Stand to Sit Sits safely with minimal use of hands    Transfers Able to transfer safely, minor use of hands    Standing Unsupported with Eyes Closed Able to stand 10 seconds safely    Standing Unsupported with Feet Together Able to place feet together independently and stand 1 minute safely    From Standing, Reach Forward with Outstretched Arm Can reach confidently >25 cm (10")    From Standing Position, Pick up Object from Floor Able to pick up shoe safely and easily    From Standing Position, Turn to Look Behind Over each Shoulder Looks behind from both sides and weight shifts well    Turn 360 Degrees Able to  turn 360 degrees safely in 4 seconds or less    Standing Unsupported, Alternately Place Feet on Step/Stool Able to stand independently and complete 8 steps >20 seconds    Standing Unsupported, One Foot in Front Able to take small step independently and hold 30 seconds    Standing on One Leg Able to lift leg independently and hold 5-10 seconds    Total Score 52             INTERVENTIONS   5xSTS: achieved Warm-up trial  Trial 1: 17 seconds  ABC Scale : 78.75%  10MWT: No AD: 0.71 m/s With SPC: 0.68 m/s  Berg: 52/56  FOTO: 62   Instructed in HEP, safety with exercises, including having a caregiver present when performing SLB with counter support. Pt and caregiver (niece) verbalized understanding.  Access Code: [email or web address] URL: [email or web address] Date: 03/07/2022 Prepared by: Ricard Dillon  Exercises - Sit to Stand with Counter Support  - 1 x daily - 5 x weekly - 3 sets - 10 reps - Standing Single Leg Stance with Counter Support  - 1 x daily - 7 x weekly - 2 sets - 2 reps - 30 hold     PT Education - 03/07/22 1656     Education Details goals, discharge recommendations, HEP    Person(s) Educated Patient    Methods Explanation;Demonstration;Tactile cues;Verbal cues;Handout    Comprehension Verbalized understanding;Returned demonstration              PT Short Term Goals - 03/07/22 1607       PT SHORT TERM GOAL #1   Title Patient will be independent in home exercise program to improve strength/mobility for better functional independence with ADLs.    Baseline 5/24: pt independnet and compliant with HEP    Time 6    Period Weeks    Status Achieved    Target Date 03/07/22               PT Long Term Goals - 03/07/22 1607       PT LONG TERM GOAL #1   Title Patient will increase FOTO score to equal to or greater than  63  to demonstrate statistically significant improvement in mobility  and quality of life.    Baseline 4/12: 45; 5/24: 62%;     Time 12    Period Weeks    Status Achieved    Target Date 04/18/22      PT LONG TERM GOAL #2   Title Pt will improve BERG by at least 3 points in order to demonstrate clinically significant improvement in balance.    Baseline 4/12: 40/56; 5/24: 52/56    Time 12    Period Weeks    Status Achieved    Target Date 04/18/22      PT LONG TERM GOAL #3   Title Pt will decrease 5TSTS by at least 3 seconds in order to demonstrate clinically significant improvement in LE strength.    Baseline 4/12: 21.28 sec use of BUEs 5/24: 17 sec use of BUEs    Time 12    Period Weeks    Status Achieved    Target Date 04/18/22      PT LONG TERM GOAL #4   Title Patient will increase 10 meter walk test to >1.2ms as to improve gait speed for better community ambulation and to reduce fall risk.    Baseline 4/12: 0.6 m/s with 2WW; 5/24: no AD 0.71 m/s, with SPC 0.68 m/s    Time 12    Period Weeks    Status On-going    Target Date 04/18/22      PT LONG TERM GOAL #5   Title Patient will increase ABC scale score >60% to demonstrate better functional mobility and better confidence with ADLs.    Baseline 4/12: 25%; 5/24: 78.75    Time 12    Period Weeks    Status New    Target Date 04/18/22                   Plan - 03/07/22 1657     Clinical Impression Statement Goals reassessed on this date. Pt has achieved all PT goals except for 10MWT goal. While pt did not achieve 10MWT goal she did improve gait speed and was able to complete the test with a SPC and without an AD. Pt had previously completed test with 2WW. Pt caregiver will be having surgery tomorrow and will not be able to transport pt to PT so today will be pt's last outpatient PT session. PT set-up additional HEP for pt to practice at home (see note) to continue and maintain gains made in therapy. The pt would benefit from further skilled PT services to continue to address deficits in strength and gait but is to be discharged at this time for  the aforementioned reason. PT discussed with pt to contact clinic should she have any questions/concerns. Pt and caregiver agreeable to plan for d/c.    Personal Factors and Comorbidities Age;Comorbidity 3+;Fitness;Sex;Past/Current Experience    Comorbidities PMH: aortic stenosis, cataracts, breast CA, chronic diastolic (congestive) heart failure, depression, diverticulosis, HTN, iron deficiency anemia, osbstructive sleep apnea, OA, osteopenia, s/p aortic valve replacement with bioprosthetic valve (2016), skin CA, stroke, supraventricular tachycardia, syncope and collapse, UTI    Examination-Activity Limitations Bathing;Bend;Dressing;Transfers;Stairs;Stand;Locomotion Level;Lift;Carry;Reach Overhead;Squat;Continence    Examination-Participation Restrictions Driving;Medication Management;Cleaning;Meal Prep;Yard Work;Community Activity;Shop;Laundry    Stability/Clinical Decision Making Evolving/Moderate complexity    Rehab Potential Good    PT Frequency 2x / week    PT Duration 2 weeks    PT Treatment/Interventions ADLs/Self Care Home Management;Biofeedback;Cryotherapy;Canalith Repostioning;Electrical Stimulation;Moist Heat;Traction;Ultrasound;Iontophoresis '4mg'$ /ml Dexamethasone;Parrafin;Contrast Bath;DME Instruction;Gait training;Stair training;Functional mobility training;Therapeutic activities;Therapeutic exercise;Balance training;Neuromuscular re-education;Patient/family education;Orthotic Fit/Training;Manual techniques;Wheelchair mobility training;Passive  range of motion;Dry needling;Energy conservation;Splinting;Taping;Vestibular;Visual/perceptual remediation/compensation;Joint Manipulations    PT Next Visit Plan strength, endurance, balance, advance HEP    PT Home Exercise Plan Reviewed current HEP from CIR today (02/07/2022); no updates; 5/24: Access Code: [email or web address]    Consulted and Agree with Plan of Care Patient             Patient will benefit from skilled therapeutic intervention in order  to improve the following deficits and impairments:  Abnormal gait, Decreased activity tolerance, Decreased endurance, Decreased strength, Improper body mechanics, Decreased balance, Decreased coordination, Decreased mobility, Difficulty walking, Postural dysfunction  Visit Diagnosis: Difficulty in walking, not elsewhere classified  Muscle weakness (generalized)  Unsteadiness on feet     Problem List Patient Active Problem List   Diagnosis Date Noted   Dysphagia 01/02/2022   ABLA (acute blood loss anemia) 01/02/2022   SDH (subdural hematoma) ([place]) 12/25/2021   Essential hypertension 12/24/2021   Subdural hematoma ([place]) 12/18/2021   Diverticulitis 05/27/2019   Unsteady gait 12/27/2018   PAF (paroxysmal atrial fibrillation) ([place]) 11/14/2015   Encounter for therapeutic drug monitoring 09/29/2015   Chronic diastolic (congestive) heart failure (HCC)    SOB (shortness of breath) 08/17/2015   Angina pectoris ([place]) 08/17/2015   Low back pain 08/12/2015   Sinus tachycardia 01/06/2014   Osteoarthritis of both knees 01/06/2014   Aortic valve stenosis 11/27/2012   SVT (supraventricular tachycardia) ([place]) 11/27/2012   Syncope 11/27/2012    Zollie Pee, PT 03/07/2022, 5:10 PM  [place] MAIN [facility] SERVICES [place], [place], [address] Phone: [phone]   Fax:  [phone]  Name: Kayla Gray MRN: [id number] Date of Birth: 1935-12-17

## 2022-03-08 NOTE — Therapy (Signed)
[place] MAIN [facility] SERVICES [address] [place], [place], [address] Phone: [phone]   Fax:  [phone]  Speech Language Pathology Treatment  Patient Details  Name: Kayla Gray MRN: [id number] Date of Birth: 1936/07/05 Referring Provider (SLP): Reesa Chew, [place] (CIR)   Encounter Date: 03/06/2022   End of Session - 03/08/22 0655     Visit Number 8    Number of Visits 25    Date for SLP Re-Evaluation 04/19/22    Authorization Type [employer]    Authorization Time Period 01/24/2022 thru 04/19/2022    Authorization - Visit Number 8    Progress Note Due on Visit 10    SLP Start Time 1500    SLP Stop Time  1600    SLP Time Calculation (min) 60 min    Activity Tolerance Patient tolerated treatment well             Past Medical History:  Diagnosis Date   Aortic stenosis    Bilateral cataracts    Bleeding nose    Thurs night (09/08/15) and Fri (09/09/15) left side.Bleeding didn't last long   Breast cancer ([place]) 1999   right breast lumpectomy and rad tx   Carotid arterial disease ([place])    a. 12/2018 Carotid U/S: RICA <25%, LICA 05-39%.   Chronic diastolic (congestive) heart failure (HCC)    Depression    Diverticulosis    Gastroesophageal reflux disease    Heart murmur    History of cardiac catheterization    a. 08/2015 Cath: nl cors. Sev AS (Peak grad 59mHg).   Hyperlipidemia    Hypertension    Iron deficiency anemia    Obstructive sleep apnea    Osteoarthritis    Osteopenia    Peptic ulcer disease    Personal history of radiation therapy    PSVT (paroxysmal supraventricular tachycardia) (HCC)    S/P aortic valve replacement with bioprosthetic valve 09/16/2015   a. 09/2015 s/p 23 mm Edwards Intuity Elite bioprosthetic tissue valve; b. 12/2021 Echo: EF 60-65%, no rwma, GrI DD, nl RV fxn. Mild MR. Nl fxn'ing AoV prosthesis.   Skin cancer 2020   Stroke ([facility]    11/14/13   Subdural hematoma ([place]    a.  12/2021 fall-->R subdural hematoma w/ midlilne shift-->s/p craniotomy.   Supraventricular tachycardia ([place]    Syncope and collapse    Urinary tract infection     Past Surgical History:  Procedure Laterality Date   AORTIC VALVE REPLACEMENT N/A 09/16/2015   Procedure: AORTIC VALVE REPLACEMENT (AVR);  Surgeon: CRexene Alberts MD;  [place]  Service: Open Heart Surgery;  Laterality: N/A;   BREAST BIOPSY Right 1999   breast ca radation   BREAST EXCISIONAL BIOPSY Left yrs ago    benign   BREAST LUMPECTOMY Right 1999   with radiation   CARDIAC CATHETERIZATION N/A 08/17/2015   Procedure: Left Heart Cath and Coronary Angiography;  Surgeon: TMinna Merritts MD;  [facility];  Service: Cardiovascular;  Laterality: N/A;   CATARACT EXTRACTION, BILATERAL     CHOLECYSTECTOMY     CRANIOTOMY Right 12/18/2021   Procedure: CRANIOTOMY HEMATOMA EVACUATION SUBDURAL;  Surgeon: YMeade Maw MD;  Location: [facility];  Service: Neurosurgery;  Laterality: Right;   JOINT REPLACEMENT Bilateral    knee replacement   KNEE ARTHROSCOPY     right   KNEE ARTHROSCOPY     left   SKIN CANCER EXCISION  2020   TEE  WITHOUT CARDIOVERSION N/A 09/16/2015   Procedure: TRANSESOPHAGEAL ECHOCARDIOGRAM (TEE);  Surgeon: Rexene Alberts, MD;  Location: [place];  Service: Open Heart Surgery;  Laterality: N/A;   TONSILLECTOMY     VAGINAL HYSTERECTOMY      There were no vitals filed for this visit.   Subjective Assessment - 03/08/22 0654     Subjective Pt interested in HHST    Currently in Pain? No/denies                   ADULT SLP TREATMENT - 03/08/22 0001       Treatment Provided   Treatment provided Cognitive-Linquistic      Cognitive-Linquistic Treatment   Treatment focused on Cognition;Patient/family/caregiver education    Skilled Treatment Skilled treatment session focused on pt's cognitive impairments with focus on recall, divided attention and strategy generation for  problem solving.       SLP facilitated session by providing the following interventions:      In an effort to help pt develop strategies within IADLs, SLP engaged pt in describing her daily routines. Pt has someone who cleans her house every 2 weeks and she doesn't cook often as her husband drives and gets take out. She provides an example of frustration and potential decreased safety awareness as she states that she was headed out the front door to pick up torn pieces of a bag in the yard" because her husband won't do it. Reached out to PT for further assessment of physical ability.   Rationale for Evaluation and Treatment Rehabilitation              SLP Education - 03/08/22 [phone]     Education Details HHST    Person(s) Educated Patient    Methods Explanation;Demonstration;Tactile cues;Verbal cues;Handout    Comprehension Verbalized understanding              SLP Short Term Goals - 01/25/22 1557       SLP SHORT TERM GOAL #1   Title With moderate assistance, pt will a) recall and b) demonstrate use of at least three external memory strategies.    Baseline new goal    Time 10    Period --   sessions   Status New      SLP SHORT TERM GOAL #2   Title With moderate assistance, pt will sequence daily household tasks with > 90% accuracy.    Baseline new goal    Time 10    Period --   sessions   Status New      SLP SHORT TERM GOAL #3   Title Given minimal assistance, pt will complete complex problem solving tasks with 80% accuracy.    Baseline moderate assistance required    Time 10    Period --   sessions   Status New              SLP Long Term Goals - 01/25/22 1559       SLP LONG TERM GOAL #1   Title Patient will demonstrate improved cognitive linguistic function for supervised completion of iADLS tasks in home/community environments    Baseline moderate assistance required    Time 12    Period Weeks    Status New    Target Date 04/19/22               Plan - 03/08/22 0655     Clinical Impression Statement Pt is always very willing to try and while she shows understanding  that the tasks are difficult, she doesn't demonstrate any insight into reason tasks are difficult. She is pleasantly agreeable which can give the appearance of understanding when she is actually confused. Will investigate possibility of HHST to help with insight into safety situations. As a result, skilled ST intervention continues to be required to target pt's cognitive impairment in an effort to increase pt's functional independence, possible return to driving.    Speech Therapy Frequency 2x / week    Duration 12 weeks    Treatment/Interventions Environmental controls;Compensatory strategies;Cueing hierarchy;Internal/external aids;Functional tasks;SLP instruction and feedback;Patient/family education    Potential to Achieve Goals Good    Potential Considerations Ability to learn/carryover information;Severity of impairments;Family/community support    SLP Home Exercise Plan provided, see pt instructions section    Consulted and Agree with Plan of Care Patient             Patient will benefit from skilled therapeutic intervention in order to improve the following deficits and impairments:   Cognitive communication deficit  SDH (subdural hematoma) ([place])    Problem List Patient Active Problem List   Diagnosis Date Noted   Dysphagia 01/02/2022   ABLA (acute blood loss anemia) 01/02/2022   SDH (subdural hematoma) ([place]) 12/25/2021   Essential hypertension 12/24/2021   Subdural hematoma ([place]) 12/18/2021   Diverticulitis 05/27/2019   Unsteady gait 12/27/2018   PAF (paroxysmal atrial fibrillation) ([place]) 11/14/2015   Encounter for therapeutic drug monitoring 09/29/2015   Chronic diastolic (congestive) heart failure (HCC)    SOB (shortness of breath) 08/17/2015   Angina pectoris ([place]) 08/17/2015   Low back pain 08/12/2015   Sinus tachycardia 01/06/2014    Osteoarthritis of both knees 01/06/2014   Aortic valve stenosis 11/27/2012   SVT (supraventricular tachycardia) ([place]) 11/27/2012   Syncope 11/27/2012   Rabecka Brendel B. Rutherford Nail M.S., CCC-SLP, [place] Pathologist Certified Brain Injury [place]  [facility] [phone] Ascom [phone] Fax [phone]  Clarisa Fling 03/08/2022, 6:56 AM  [place] [address] [place], [place], [address] Phone: [phone]   Fax:  [phone]   Name: Kayla Gray MRN: [id number] Date of Birth: [birth year]

## 2022-03-08 NOTE — Patient Instructions (Signed)
Complete word searches

## 2022-03-08 NOTE — Therapy (Signed)
[place] MAIN [facility] SERVICES [address] [place], [place], [address] Phone: [phone]   Fax:  [phone]  Speech Language Pathology Treatment DISCHARGE SUMMARY  Patient Details  Name: Kayla Gray MRN: [id number] Date of Birth: 07/09/1936 Referring Provider (SLP): Reesa Chew, [place] (CIR)   Encounter Date: 03/07/2022   End of Session - 03/08/22 1151     Visit Number 9    Number of Visits 25    Date for SLP Re-Evaluation 04/19/22    Authorization Type [employer] P[employer]    Authorization Time Period 01/24/2022 thru 04/19/2022    Authorization - Visit Number 9    Progress Note Due on Visit 10    SLP Start Time 1500    SLP Stop Time  1600    SLP Time Calculation (min) 60 min    Activity Tolerance Patient tolerated treatment well             Past Medical History:  Diagnosis Date   Aortic stenosis    Bilateral cataracts    Bleeding nose    Thurs night (09/08/15) and Fri (09/09/15) left side.Bleeding didn't last long   Breast cancer ([place]) 1999   right breast lumpectomy and rad tx   Carotid arterial disease ([place])    a. 12/2018 Carotid U/S: RICA <35%, LICA 68-61%.   Chronic diastolic (congestive) heart failure (HCC)    Depression    Diverticulosis    Gastroesophageal reflux disease    Heart murmur    History of cardiac catheterization    a. 08/2015 Cath: nl cors. Sev AS (Peak grad 28mHg).   Hyperlipidemia    Hypertension    Iron deficiency anemia    Obstructive sleep apnea    Osteoarthritis    Osteopenia    Peptic ulcer disease    Personal history of radiation therapy    PSVT (paroxysmal supraventricular tachycardia) (HCC)    S/P aortic valve replacement with bioprosthetic valve 09/16/2015   a. 09/2015 s/p 23 mm Edwards Intuity Elite bioprosthetic tissue valve; b. 12/2021 Echo: EF 60-65%, no rwma, GrI DD, nl RV fxn. Mild MR. Nl fxn'ing AoV prosthesis.   Skin cancer 2020   Stroke ([facility]    11/14/13   [place]    a. 12/2021 fall-->R subdural hematoma w/ midlilne shift-->s/p craniotomy.   [place]    Syncope and collapse    Urinary tract infection     Past Surgical History:  Procedure Laterality Date   AORTIC VALVE REPLACEMENT N/A 09/16/2015   Procedure: AORTIC VALVE REPLACEMENT (AVR);  Surgeon: CRexene Alberts MD;  [facility]  Service: Open Heart Surgery;  Laterality: N/A;   BREAST BIOPSY Right 1999   breast ca radation   BREAST EXCISIONAL BIOPSY Left yrs ago    benign   BREAST LUMPECTOMY Right 1999   with radiation   CARDIAC CATHETERIZATION N/A 08/17/2015   Procedure: Left Heart Cath and Coronary Angiography;  Surgeon: TMinna Merritts MD;  [facility];  Service: Cardiovascular;  Laterality: N/A;   CATARACT EXTRACTION, BILATERAL     CHOLECYSTECTOMY     CRANIOTOMY Right 12/18/2021   Procedure: CRANIOTOMY HEMATOMA EVACUATION SUBDURAL;  Surgeon: YMeade Maw MD;  Location: [facility];  Service: Neurosurgery;  Laterality: Right;   JOINT REPLACEMENT Bilateral    knee replacement   KNEE ARTHROSCOPY     right   KNEE ARTHROSCOPY     left   SKIN CANCER EXCISION  2020  TEE WITHOUT CARDIOVERSION N/A 09/16/2015   Procedure: TRANSESOPHAGEAL ECHOCARDIOGRAM (TEE);  Surgeon: Rexene Alberts, MD;  Location: [place];  Service: Open Heart Surgery;  Laterality: N/A;   TONSILLECTOMY     VAGINAL HYSTERECTOMY      There were no vitals filed for this visit.   Subjective Assessment - 03/08/22 1148     Subjective pt eager to know more about HHST/HHPT    Currently in Pain? No/denies                   ADULT SLP TREATMENT - 03/08/22 1149       Treatment Provided   Treatment provided Cognitive-Linquistic      Cognitive-Linquistic Treatment   Treatment focused on Cognition;Patient/family/caregiver education    Skilled Treatment Education was provided with information written down for pt regarding her insurance's coverage of  HHST. Furthermore, several potential home health companies were also provided as pt doesn't have a computer to look up phone numbers. Additionally, instructions were written for pt on need for a referral and to contact her PCP's office to insure that they received information from this writer. SLP further facilitated session by providing moderate faded to minimal assistance to use strategies to complete word searches in a more timely manner.              SLP Education - 03/08/22 1149     Education Details provided and completed    Person(s) Educated Patient    Methods Explanation;Demonstration;Verbal cues;Handout    Comprehension Verbalized understanding;Returned demonstration              SLP Short Term Goals - 03/08/22 1154       SLP SHORT TERM GOAL #1   Title With moderate assistance, pt will a) recall and b) demonstrate use of at least three external memory strategies.    Status Achieved      SLP SHORT TERM GOAL #2   Title With moderate assistance, pt will sequence daily household tasks with > 90% accuracy.    Status Achieved      SLP SHORT TERM GOAL #3   Title Given minimal assistance, pt will complete complex problem solving tasks with 80% accuracy.    Status Deferred              SLP Long Term Goals - 03/08/22 1155       SLP LONG TERM GOAL #1   Title Patient will demonstrate improved cognitive linguistic function for supervised completion of iADLS tasks in home/community environments    Status Partially Met              Plan - 03/08/22 1151     Clinical Impression Statement At this time, recommend pt receive HHST in an effort to target safety when performing IADLs. Pt is agreeable. Will reach out to pt's PCP and request referral. All education has been completed and all questions answered to pt satisfaction.    Potential to Achieve Goals Good    Potential Considerations Ability to learn/carryover information;Severity of impairments;Family/community  support    SLP Home Exercise Plan provided, see pt instructions section    Consulted and Agree with Plan of Care Patient             Problem List Patient Active Problem List   Diagnosis Date Noted   Dysphagia 01/02/2022   ABLA (acute blood loss anemia) 01/02/2022   SDH (subdural hematoma) ([place]) 12/25/2021   Essential hypertension 12/24/2021   Subdural hematoma (HCC) 12/18/2021  Diverticulitis 05/27/2019   Unsteady gait 12/27/2018   PAF (paroxysmal atrial fibrillation) ([place]) 11/14/2015   Encounter for therapeutic drug monitoring 09/29/2015   Chronic diastolic (congestive) heart failure (HCC)    SOB (shortness of breath) 08/17/2015   Angina pectoris ([place]) 08/17/2015   Low back pain 08/12/2015   Sinus tachycardia 01/06/2014   Osteoarthritis of both knees 01/06/2014   Aortic valve stenosis 11/27/2012   SVT (supraventricular tachycardia) ([place]) 11/27/2012   Syncope 11/27/2012   Neka Bise B. Rutherford Nail M.S., CCC-SLP, [place] Pathologist Certified Brain Injury [place]  [facility] [id number] Ascom [phone] Fax [phone]  Clarisa Fling 03/08/2022, 11:55 AM  [facility] [address] [place], [place], [address] Phone: [phone]   Fax:  [phone]   Name: Kayla Gray MRN: [id number] Date of Birth: 12/14/1935

## 2022-03-08 NOTE — Patient Instructions (Signed)
Continue cognitive activities like word searches

## 2022-03-13 ENCOUNTER — Encounter: Payer: PPO | Admitting: Occupational Therapy

## 2022-03-14 ENCOUNTER — Encounter: Payer: PPO | Admitting: Occupational Therapy

## 2022-03-14 ENCOUNTER — Ambulatory Visit: Payer: PPO

## 2022-03-14 ENCOUNTER — Ambulatory Visit: Payer: PPO | Admitting: Speech Pathology

## 2022-03-19 ENCOUNTER — Encounter: Payer: PPO | Admitting: Occupational Therapy

## 2022-03-19 ENCOUNTER — Encounter: Payer: PPO | Admitting: Speech Pathology

## 2022-03-19 ENCOUNTER — Ambulatory Visit: Payer: PPO

## 2022-03-20 ENCOUNTER — Encounter: Payer: PPO | Admitting: Speech Pathology

## 2022-03-21 ENCOUNTER — Ambulatory Visit: Payer: PPO

## 2022-03-21 ENCOUNTER — Encounter: Payer: PPO | Admitting: Occupational Therapy

## 2022-03-21 ENCOUNTER — Encounter: Payer: PPO | Admitting: Speech Pathology

## 2022-03-26 ENCOUNTER — Ambulatory Visit: Payer: PPO

## 2022-03-26 ENCOUNTER — Encounter: Payer: PPO | Admitting: Speech Pathology

## 2022-03-26 ENCOUNTER — Encounter: Payer: PPO | Admitting: Occupational Therapy

## 2022-03-27 ENCOUNTER — Encounter: Payer: PPO | Admitting: Speech Pathology

## 2022-03-28 ENCOUNTER — Encounter: Payer: PPO | Admitting: Occupational Therapy

## 2022-03-28 ENCOUNTER — Ambulatory Visit: Payer: PPO

## 2022-03-28 ENCOUNTER — Encounter: Payer: PPO | Admitting: Speech Pathology

## 2022-04-02 ENCOUNTER — Ambulatory Visit: Payer: PPO

## 2022-04-02 ENCOUNTER — Encounter: Payer: PPO | Admitting: Occupational Therapy

## 2022-04-02 ENCOUNTER — Encounter: Payer: PPO | Admitting: Speech Pathology

## 2022-04-03 ENCOUNTER — Encounter: Payer: PPO | Admitting: Speech Pathology

## 2022-04-03 DIAGNOSIS — F0390 Unspecified dementia without behavioral disturbance: Secondary | ICD-10-CM | POA: Diagnosis not present

## 2022-04-03 DIAGNOSIS — R2689 Other abnormalities of gait and mobility: Secondary | ICD-10-CM | POA: Diagnosis not present

## 2022-04-03 DIAGNOSIS — Z9889 Other specified postprocedural states: Secondary | ICD-10-CM | POA: Diagnosis not present

## 2022-04-03 DIAGNOSIS — Z8673 Personal history of transient ischemic attack (TIA), and cerebral infarction without residual deficits: Secondary | ICD-10-CM | POA: Diagnosis not present

## 2022-04-03 DIAGNOSIS — Z8679 Personal history of other diseases of the circulatory system: Secondary | ICD-10-CM | POA: Diagnosis not present

## 2022-04-03 DIAGNOSIS — Z8659 Personal history of other mental and behavioral disorders: Secondary | ICD-10-CM | POA: Diagnosis not present

## 2022-04-04 ENCOUNTER — Encounter: Payer: PPO | Admitting: Occupational Therapy

## 2022-04-04 ENCOUNTER — Ambulatory Visit: Payer: PPO

## 2022-04-04 ENCOUNTER — Encounter: Payer: PPO | Admitting: Speech Pathology

## 2022-04-05 DIAGNOSIS — E785 Hyperlipidemia, unspecified: Secondary | ICD-10-CM | POA: Diagnosis not present

## 2022-04-05 DIAGNOSIS — M48061 Spinal stenosis, lumbar region without neurogenic claudication: Secondary | ICD-10-CM | POA: Diagnosis not present

## 2022-04-05 DIAGNOSIS — J309 Allergic rhinitis, unspecified: Secondary | ICD-10-CM | POA: Diagnosis not present

## 2022-04-05 DIAGNOSIS — M47812 Spondylosis without myelopathy or radiculopathy, cervical region: Secondary | ICD-10-CM | POA: Diagnosis not present

## 2022-04-05 DIAGNOSIS — K219 Gastro-esophageal reflux disease without esophagitis: Secondary | ICD-10-CM | POA: Diagnosis not present

## 2022-04-05 DIAGNOSIS — F411 Generalized anxiety disorder: Secondary | ICD-10-CM | POA: Diagnosis not present

## 2022-04-05 DIAGNOSIS — G4700 Insomnia, unspecified: Secondary | ICD-10-CM | POA: Diagnosis not present

## 2022-04-05 DIAGNOSIS — I10 Essential (primary) hypertension: Secondary | ICD-10-CM | POA: Diagnosis not present

## 2022-04-05 DIAGNOSIS — Z9181 History of falling: Secondary | ICD-10-CM | POA: Diagnosis not present

## 2022-04-05 DIAGNOSIS — Z79899 Other long term (current) drug therapy: Secondary | ICD-10-CM | POA: Diagnosis not present

## 2022-04-05 DIAGNOSIS — Z853 Personal history of malignant neoplasm of breast: Secondary | ICD-10-CM | POA: Diagnosis not present

## 2022-04-05 DIAGNOSIS — Z96653 Presence of artificial knee joint, bilateral: Secondary | ICD-10-CM | POA: Diagnosis not present

## 2022-04-05 DIAGNOSIS — I482 Chronic atrial fibrillation, unspecified: Secondary | ICD-10-CM | POA: Diagnosis not present

## 2022-04-05 DIAGNOSIS — I69344 Monoplegia of lower limb following cerebral infarction affecting left non-dominant side: Secondary | ICD-10-CM | POA: Diagnosis not present

## 2022-04-05 DIAGNOSIS — F03A4 Unspecified dementia, mild, with anxiety: Secondary | ICD-10-CM | POA: Diagnosis not present

## 2022-04-05 DIAGNOSIS — F32A Depression, unspecified: Secondary | ICD-10-CM | POA: Diagnosis not present

## 2022-04-05 DIAGNOSIS — D500 Iron deficiency anemia secondary to blood loss (chronic): Secondary | ICD-10-CM | POA: Diagnosis not present

## 2022-04-05 DIAGNOSIS — Z9049 Acquired absence of other specified parts of digestive tract: Secondary | ICD-10-CM | POA: Diagnosis not present

## 2022-04-05 DIAGNOSIS — K279 Peptic ulcer, site unspecified, unspecified as acute or chronic, without hemorrhage or perforation: Secondary | ICD-10-CM | POA: Diagnosis not present

## 2022-04-09 ENCOUNTER — Encounter: Payer: PPO | Admitting: Occupational Therapy

## 2022-04-09 ENCOUNTER — Encounter: Payer: PPO | Admitting: Speech Pathology

## 2022-04-09 ENCOUNTER — Ambulatory Visit: Payer: PPO

## 2022-04-09 DIAGNOSIS — M48061 Spinal stenosis, lumbar region without neurogenic claudication: Secondary | ICD-10-CM | POA: Diagnosis not present

## 2022-04-09 DIAGNOSIS — E785 Hyperlipidemia, unspecified: Secondary | ICD-10-CM | POA: Diagnosis not present

## 2022-04-09 DIAGNOSIS — M47812 Spondylosis without myelopathy or radiculopathy, cervical region: Secondary | ICD-10-CM | POA: Diagnosis not present

## 2022-04-09 DIAGNOSIS — F03A4 Unspecified dementia, mild, with anxiety: Secondary | ICD-10-CM | POA: Diagnosis not present

## 2022-04-09 DIAGNOSIS — I10 Essential (primary) hypertension: Secondary | ICD-10-CM | POA: Diagnosis not present

## 2022-04-09 DIAGNOSIS — Z79899 Other long term (current) drug therapy: Secondary | ICD-10-CM | POA: Diagnosis not present

## 2022-04-09 DIAGNOSIS — Z96653 Presence of artificial knee joint, bilateral: Secondary | ICD-10-CM | POA: Diagnosis not present

## 2022-04-09 DIAGNOSIS — K279 Peptic ulcer, site unspecified, unspecified as acute or chronic, without hemorrhage or perforation: Secondary | ICD-10-CM | POA: Diagnosis not present

## 2022-04-09 DIAGNOSIS — I482 Chronic atrial fibrillation, unspecified: Secondary | ICD-10-CM | POA: Diagnosis not present

## 2022-04-09 DIAGNOSIS — F32A Depression, unspecified: Secondary | ICD-10-CM | POA: Diagnosis not present

## 2022-04-09 DIAGNOSIS — K219 Gastro-esophageal reflux disease without esophagitis: Secondary | ICD-10-CM | POA: Diagnosis not present

## 2022-04-09 DIAGNOSIS — G4700 Insomnia, unspecified: Secondary | ICD-10-CM | POA: Diagnosis not present

## 2022-04-09 DIAGNOSIS — Z853 Personal history of malignant neoplasm of breast: Secondary | ICD-10-CM | POA: Diagnosis not present

## 2022-04-09 DIAGNOSIS — J309 Allergic rhinitis, unspecified: Secondary | ICD-10-CM | POA: Diagnosis not present

## 2022-04-09 DIAGNOSIS — D500 Iron deficiency anemia secondary to blood loss (chronic): Secondary | ICD-10-CM | POA: Diagnosis not present

## 2022-04-09 DIAGNOSIS — Z9181 History of falling: Secondary | ICD-10-CM | POA: Diagnosis not present

## 2022-04-09 DIAGNOSIS — F411 Generalized anxiety disorder: Secondary | ICD-10-CM | POA: Diagnosis not present

## 2022-04-09 DIAGNOSIS — Z9049 Acquired absence of other specified parts of digestive tract: Secondary | ICD-10-CM | POA: Diagnosis not present

## 2022-04-09 DIAGNOSIS — I69344 Monoplegia of lower limb following cerebral infarction affecting left non-dominant side: Secondary | ICD-10-CM | POA: Diagnosis not present

## 2022-04-10 ENCOUNTER — Encounter: Payer: PPO | Admitting: Speech Pathology

## 2022-04-11 ENCOUNTER — Ambulatory Visit: Payer: PPO

## 2022-04-11 ENCOUNTER — Encounter: Payer: PPO | Admitting: Occupational Therapy

## 2022-04-11 ENCOUNTER — Encounter: Payer: PPO | Admitting: Speech Pathology

## 2022-04-16 ENCOUNTER — Encounter: Payer: PPO | Admitting: Occupational Therapy

## 2022-04-16 ENCOUNTER — Ambulatory Visit: Payer: PPO

## 2022-04-16 ENCOUNTER — Encounter: Payer: PPO | Admitting: Speech Pathology

## 2022-04-18 ENCOUNTER — Encounter: Payer: PPO | Admitting: Occupational Therapy

## 2022-04-18 ENCOUNTER — Encounter: Payer: PPO | Admitting: Speech Pathology

## 2022-04-18 ENCOUNTER — Ambulatory Visit: Payer: PPO

## 2022-04-19 DIAGNOSIS — D500 Iron deficiency anemia secondary to blood loss (chronic): Secondary | ICD-10-CM | POA: Diagnosis not present

## 2022-04-19 DIAGNOSIS — Z96653 Presence of artificial knee joint, bilateral: Secondary | ICD-10-CM | POA: Diagnosis not present

## 2022-04-19 DIAGNOSIS — G4700 Insomnia, unspecified: Secondary | ICD-10-CM | POA: Diagnosis not present

## 2022-04-19 DIAGNOSIS — Z9049 Acquired absence of other specified parts of digestive tract: Secondary | ICD-10-CM | POA: Diagnosis not present

## 2022-04-19 DIAGNOSIS — F32A Depression, unspecified: Secondary | ICD-10-CM | POA: Diagnosis not present

## 2022-04-19 DIAGNOSIS — J309 Allergic rhinitis, unspecified: Secondary | ICD-10-CM | POA: Diagnosis not present

## 2022-04-19 DIAGNOSIS — I69344 Monoplegia of lower limb following cerebral infarction affecting left non-dominant side: Secondary | ICD-10-CM | POA: Diagnosis not present

## 2022-04-19 DIAGNOSIS — M47812 Spondylosis without myelopathy or radiculopathy, cervical region: Secondary | ICD-10-CM | POA: Diagnosis not present

## 2022-04-19 DIAGNOSIS — F03A4 Unspecified dementia, mild, with anxiety: Secondary | ICD-10-CM | POA: Diagnosis not present

## 2022-04-19 DIAGNOSIS — I482 Chronic atrial fibrillation, unspecified: Secondary | ICD-10-CM | POA: Diagnosis not present

## 2022-04-19 DIAGNOSIS — Z853 Personal history of malignant neoplasm of breast: Secondary | ICD-10-CM | POA: Diagnosis not present

## 2022-04-19 DIAGNOSIS — Z9181 History of falling: Secondary | ICD-10-CM | POA: Diagnosis not present

## 2022-04-19 DIAGNOSIS — F411 Generalized anxiety disorder: Secondary | ICD-10-CM | POA: Diagnosis not present

## 2022-04-19 DIAGNOSIS — M48061 Spinal stenosis, lumbar region without neurogenic claudication: Secondary | ICD-10-CM | POA: Diagnosis not present

## 2022-04-19 DIAGNOSIS — Z79899 Other long term (current) drug therapy: Secondary | ICD-10-CM | POA: Diagnosis not present

## 2022-04-19 DIAGNOSIS — E785 Hyperlipidemia, unspecified: Secondary | ICD-10-CM | POA: Diagnosis not present

## 2022-04-19 DIAGNOSIS — I10 Essential (primary) hypertension: Secondary | ICD-10-CM | POA: Diagnosis not present

## 2022-04-19 DIAGNOSIS — K279 Peptic ulcer, site unspecified, unspecified as acute or chronic, without hemorrhage or perforation: Secondary | ICD-10-CM | POA: Diagnosis not present

## 2022-04-19 DIAGNOSIS — K219 Gastro-esophageal reflux disease without esophagitis: Secondary | ICD-10-CM | POA: Diagnosis not present

## 2022-04-23 ENCOUNTER — Encounter: Payer: PPO | Admitting: Speech Pathology

## 2022-04-23 ENCOUNTER — Ambulatory Visit: Payer: PPO

## 2022-04-25 ENCOUNTER — Ambulatory Visit: Payer: PPO

## 2022-04-25 ENCOUNTER — Encounter: Payer: PPO | Admitting: Speech Pathology

## 2022-04-26 DIAGNOSIS — N1831 Chronic kidney disease, stage 3a: Secondary | ICD-10-CM | POA: Diagnosis not present

## 2022-04-26 DIAGNOSIS — E7800 Pure hypercholesterolemia, unspecified: Secondary | ICD-10-CM | POA: Diagnosis not present

## 2022-04-26 DIAGNOSIS — Z79899 Other long term (current) drug therapy: Secondary | ICD-10-CM | POA: Diagnosis not present

## 2022-05-01 ENCOUNTER — Other Ambulatory Visit: Payer: Self-pay

## 2022-05-01 ENCOUNTER — Emergency Department: Payer: PPO

## 2022-05-01 ENCOUNTER — Encounter: Payer: Self-pay | Admitting: Emergency Medicine

## 2022-05-01 ENCOUNTER — Emergency Department
Admission: EM | Admit: 2022-05-01 | Discharge: 2022-05-01 | Disposition: A | Discharge status: Home / Self Care | Payer: PPO | Attending: Emergency Medicine | Admitting: Emergency Medicine

## 2022-05-01 DIAGNOSIS — R42 Dizziness and giddiness: Secondary | ICD-10-CM | POA: Insufficient documentation

## 2022-05-01 DIAGNOSIS — G309 Alzheimer's disease, unspecified: Secondary | ICD-10-CM | POA: Insufficient documentation

## 2022-05-01 DIAGNOSIS — E860 Dehydration: Secondary | ICD-10-CM | POA: Diagnosis not present

## 2022-05-01 DIAGNOSIS — N390 Urinary tract infection, site not specified: Secondary | ICD-10-CM | POA: Diagnosis not present

## 2022-05-01 DIAGNOSIS — I639 Cerebral infarction, unspecified: Secondary | ICD-10-CM | POA: Diagnosis not present

## 2022-05-01 DIAGNOSIS — R197 Diarrhea, unspecified: Secondary | ICD-10-CM | POA: Diagnosis not present

## 2022-05-01 DIAGNOSIS — R000 Tachycardia, unspecified: Secondary | ICD-10-CM | POA: Diagnosis not present

## 2022-05-01 DIAGNOSIS — N3001 Acute cystitis with hematuria: Secondary | ICD-10-CM | POA: Insufficient documentation

## 2022-05-01 LAB — URINALYSIS, ROUTINE W REFLEX MICROSCOPIC
Bilirubin Urine: NEGATIVE
Glucose, UA: NEGATIVE mg/dL
Ketones, ur: 20 mg/dL — AB
Nitrite: POSITIVE — AB
Protein, ur: 30 mg/dL — AB
Specific Gravity, Urine: 1.019 (ref 1.005–1.030)
WBC, UA: >50 WBC/hpf — ABNORMAL HIGH (ref 0–5)
pH: 5 (ref 5.0–8.0)

## 2022-05-01 LAB — CBC
HCT: 39 % (ref 36.0–46.0)
Hemoglobin: 12 g/dL (ref 12.0–15.0)
MCH: 26.9 pg (ref 26.0–34.0)
MCHC: 30.8 g/dL (ref 30.0–36.0)
MCV: 87.4 fL (ref 80.0–100.0)
Platelets: 120 10*3/uL — ABNORMAL LOW (ref 150–400)
RBC: 4.46 MIL/uL (ref 3.87–5.11)
RDW: 14 % (ref 11.5–15.5)
WBC: 8.4 10*3/uL (ref 4.0–10.5)
nRBC: 0 % (ref 0.0–0.2)

## 2022-05-01 LAB — BASIC METABOLIC PANEL
Anion gap: 7 (ref 5–15)
BUN: 12 mg/dL (ref 8–23)
CO2: 22 mmol/L (ref 22–32)
Calcium: 8.4 mg/dL — ABNORMAL LOW (ref 8.9–10.3)
Chloride: 106 mmol/L (ref 98–111)
Creatinine, Ser: 0.93 mg/dL (ref 0.44–1.00)
GFR, Estimated: >60 mL/min (ref >60)
Glucose, Bld: 114 mg/dL — ABNORMAL HIGH (ref 70–99)
Potassium: 4.8 mmol/L (ref 3.5–5.1)
Sodium: 135 mmol/L (ref 135–145)

## 2022-05-01 MED ORDER — LACTATED RINGERS IV BOLUS
1000.0000 mL | Freq: Once | INTRAVENOUS | Status: AC
  Administered 2022-05-01: 1000 mL via INTRAVENOUS

## 2022-05-01 MED ORDER — CEFDINIR 300 MG PO CAPS: 300.0000 mg | ORAL_CAPSULE | Freq: Two times a day (BID) | ORAL | 0 refills | Status: AC

## 2022-05-01 MED ORDER — CEFDINIR 300 MG PO CAPS
300.0000 mg | ORAL_CAPSULE | Freq: Once | ORAL | Status: AC
  Administered 2022-05-01: 300 mg via ORAL
  Filled 2022-05-01: qty 1

## 2022-05-01 NOTE — ED Triage Notes (Signed)
Patient to ED via [facility] from home for dizziness and diarrhea x2 days. Patient had brain surgery in March for a blood clot from a fall. Patient Aox4.

## 2022-05-01 NOTE — ED Provider Notes (Signed)
[facility] Provider Note   Event Date/Time   First MD Initiated Contact with Patient 05/01/22 1655     (approximate) History  Dizziness  HPI Kayla Gray is a 86 y.o. female with a stated past medical history of Alzheimer's dementia who presents for 3 weeks of diarrhea that is nonbloody as well as lightheadedness that has been worsening over the last 48 hours.  Patient states that this diarrhea began when she started taking Aricept and feels that this is a side effect of this medication.  Patient describes lightheadedness that is worse when getting up from a seated position or sitting up from laying down.  Patient denies any other new medications at this time including no new antihypertensive medications or beta-blockers.  Patient also endorses dysuria that has been present over the last 48 hours as well ROS: Patient currently denies any vision changes, tinnitus, difficulty speaking, facial droop, sore throat, chest pain, shortness of breath, abdominal pain, nausea/vomiting, or weakness/numbness/paresthesias in any extremity   Physical Exam  Triage Vital Signs: ED Triage Vitals  Enc Vitals Group     BP 05/01/22 1437 139/90     Pulse Rate 05/01/22 1437 91     Resp 05/01/22 1437 18     Temp 05/01/22 1437 98.2 F (36.8 C)     Temp Source 05/01/22 1437 Oral     SpO2 05/01/22 1433 97 %     Weight 05/01/22 1437 160 lb (72.6 kg)     Height 05/01/22 1437 '5\' 2"'$  (1.575 m)     Head Circumference --      Peak Flow --      Pain Score 05/01/22 1437 0     Pain Loc --      Pain Edu? --      Excl. in [place]? --    Most recent vital signs: Vitals:   05/01/22 1830 05/01/22 1900  BP: (!) 152/77 (!) 164/86  Pulse: 88 83  Resp: 19 13  Temp:    SpO2: 99% 100%   General: Awake, oriented x4. CV:  Good peripheral perfusion.  Resp:  Normal effort.  Abd:  No distention.  Other:  Elderly Caucasian female laying in bed in no acute distress ED Results / Procedures /  Treatments  Labs (all labs ordered are listed, but only abnormal results are displayed) Labs Reviewed  BASIC METABOLIC PANEL - Abnormal; Notable for the following components:      Result Value   Glucose, Bld 114 (*)    Calcium 8.4 (*)    All other components within normal limits  CBC - Abnormal; Notable for the following components:   Platelets 120 (*)    All other components within normal limits  URINALYSIS, ROUTINE W REFLEX MICROSCOPIC - Abnormal; Notable for the following components:   Color, Urine YELLOW (*)    APPearance HAZY (*)    Hgb urine dipstick MODERATE (*)    Ketones, ur 20 (*)    Protein, ur 30 (*)    Nitrite POSITIVE (*)    Leukocytes,Ua LARGE (*)    WBC, UA >50 (*)    Bacteria, UA FEW (*)    All other components within normal limits  CBG MONITORING, ED   EKG ED ECG REPORT I, Naaman Plummer, the attending physician, personally viewed and interpreted this ECG. Date: 05/01/2022 EKG Time: 1439 Rate: 87 Rhythm: normal sinus rhythm QRS Axis: normal Intervals: normal ST/T Wave abnormalities: normal Narrative Interpretation: Normal sinus rhythm with premature  atrial contractions.  No evidence of acute ischemia RADIOLOGY ED MD interpretation: CT of the head without contrast interpreted by me shows no evidence of acute abnormalities including no intracerebral hemorrhage, obvious masses, or significant edema -Agree with radiology assessment Official radiology report(s): CT HEAD WO CONTRAST (5MM)  Result Date: 05/01/2022 CLINICAL DATA:  Dizziness, persistent/recurrent, cardiac or vascular cause suspected EXAM: CT HEAD WITHOUT CONTRAST TECHNIQUE: Contiguous axial images were obtained from the base of the skull through the vertex without intravenous contrast. RADIATION DOSE REDUCTION: This exam was performed according to the departmental dose-optimization program which includes automated exposure control, adjustment of the mA and/or kV according to patient size and/or use  of iterative reconstruction technique. COMPARISON:  April 2023 FINDINGS: Brain: There is no acute intracranial hemorrhage, mass effect, or edema. Gray-white differentiation is preserved. Right subdural hematoma has resolved. Ventricles and sulci stable in size and configuration. Patchy and confluent areas of low-density in the supratentorial white matter probably reflects stable chronic microvascular ischemic changes. Small chronic cerebellar infarcts. Vascular: No hyperdense vessel. There is intracranial atherosclerotic calcification at the skull base. A focus of probable vascular calcification is again noted within the left sylvian fissure. Skull: Prior right craniotomy. Sinuses/Orbits: No acute finding. Other: None. IMPRESSION: No acute intracranial abnormality. Right subdural hematoma has resolved. Electronically Signed   By: Macy Mis M.D.   On: 05/01/2022 15:18   PROCEDURES: Critical Care performed: No .1-3 Lead EKG Interpretation  Performed by: Naaman Plummer, MD Authorized by: Naaman Plummer, MD     Interpretation: normal     ECG rate:  84   ECG rate assessment: normal     Rhythm: sinus rhythm     Ectopy: none     Conduction: normal    MEDICATIONS ORDERED IN ED: Medications  cefdinir (OMNICEF) capsule 300 mg (has no administration in time range)  lactated ringers bolus 1,000 mL (1,000 mLs Intravenous New Bag/Given 05/01/22 1828)   IMPRESSION / MDM / [place] / ED COURSE  I reviewed the triage vital signs and the nursing notes.                             The patient is on the cardiac monitor to evaluate for evidence of arrhythmia and/or significant heart rate changes. Patient's presentation is most consistent with acute presentation with potential threat to life or bodily function. Not Pregnant. Unlikely TOA, Ovarian Torsion, PID, gonorrhea/chlamydia. Low suspicion for Infected Urolithiasis, AAA, Cholecystitis, Pancreatitis, SBO, Appendicitis, or other acute  abdomen.  Rx: Cefdinir 300 mg BID for 5 days Disposition: Discharge home. SRP discussed. Advise follow up with primary care provider within 24-72 hours.   FINAL CLINICAL IMPRESSION(S) / ED DIAGNOSES   Final diagnoses:  Lightheadedness  Dehydration  Urinary tract infection with hematuria, site unspecified   Rx / DC Orders   ED Discharge Orders          Ordered    cefdinir (OMNICEF) 300 MG capsule  2 times daily        05/01/22 1924           Note:  This document was prepared using Dragon voice recognition software and may include unintentional dictation errors.   Naaman Plummer, MD 05/01/22 [id number]

## 2022-05-01 NOTE — ED Provider Triage Note (Signed)
Emergency Medicine Provider Triage Evaluation Note  Kayla Gray, a 86 y.o. female  was evaluated in triage.  Pt complains of dizziness and diarrhea for the last 2 to 3 days.  Patient is 3 months status post blood clot evacuation following a closed head injury.  Denies any recent injury, trauma, or fall..  Review of Systems  Positive: Dizziness, diarrhea Negative: LOC, vision change  Physical Exam  BP 139/90 (BP Location: Right Arm)   Pulse 91   Temp 98.2 F (36.8 C) (Oral)   Resp 18   SpO2 99%  Gen:   Awake, no distress  NAD Resp:  Normal effort CTA MSK:   Moves extremities without difficulty  Other:    Medical Decision Making  Medically screening exam initiated at 2:37 PM.  Appropriate orders placed.  Fay Records was informed that the remainder of the evaluation will be completed by another provider, this initial triage assessment does not replace that evaluation, and the importance of remaining in the ED until their evaluation is complete.  Geriatric patient 26-monthstatus post evacuation of subdural hematoma, presenting with 2 days of dizziness and diarrhea.   MMelvenia Needles PA-C 05/01/22 1444

## 2022-05-02 ENCOUNTER — Ambulatory Visit: Payer: PPO

## 2022-05-02 ENCOUNTER — Encounter: Payer: PPO | Admitting: Speech Pathology

## 2022-05-02 DIAGNOSIS — Z09 Encounter for follow-up examination after completed treatment for conditions other than malignant neoplasm: Secondary | ICD-10-CM | POA: Diagnosis not present

## 2022-05-02 DIAGNOSIS — R000 Tachycardia, unspecified: Secondary | ICD-10-CM | POA: Diagnosis not present

## 2022-05-02 DIAGNOSIS — R509 Fever, unspecified: Secondary | ICD-10-CM | POA: Diagnosis not present

## 2022-05-02 DIAGNOSIS — Z87440 Personal history of urinary (tract) infections: Secondary | ICD-10-CM | POA: Diagnosis not present

## 2022-05-03 DIAGNOSIS — N1831 Chronic kidney disease, stage 3a: Secondary | ICD-10-CM | POA: Diagnosis not present

## 2022-05-03 DIAGNOSIS — I5032 Chronic diastolic (congestive) heart failure: Secondary | ICD-10-CM | POA: Diagnosis not present

## 2022-05-03 DIAGNOSIS — R4189 Other symptoms and signs involving cognitive functions and awareness: Secondary | ICD-10-CM | POA: Diagnosis not present

## 2022-05-03 DIAGNOSIS — I480 Paroxysmal atrial fibrillation: Secondary | ICD-10-CM | POA: Diagnosis not present

## 2022-05-03 DIAGNOSIS — I10 Essential (primary) hypertension: Secondary | ICD-10-CM | POA: Diagnosis not present

## 2022-05-03 DIAGNOSIS — E7800 Pure hypercholesterolemia, unspecified: Secondary | ICD-10-CM | POA: Diagnosis not present

## 2022-05-03 DIAGNOSIS — N390 Urinary tract infection, site not specified: Secondary | ICD-10-CM | POA: Diagnosis not present

## 2022-05-03 DIAGNOSIS — Z79899 Other long term (current) drug therapy: Secondary | ICD-10-CM | POA: Diagnosis not present

## 2022-05-03 DIAGNOSIS — I700 Atherosclerosis of aorta: Secondary | ICD-10-CM | POA: Diagnosis not present

## 2022-05-04 DIAGNOSIS — Z87440 Personal history of urinary (tract) infections: Secondary | ICD-10-CM | POA: Diagnosis not present

## 2022-05-04 DIAGNOSIS — I4891 Unspecified atrial fibrillation: Secondary | ICD-10-CM | POA: Diagnosis not present

## 2022-05-07 ENCOUNTER — Ambulatory Visit: Payer: PPO

## 2022-05-07 ENCOUNTER — Encounter: Payer: PPO | Admitting: Speech Pathology

## 2022-05-08 ENCOUNTER — Ambulatory Visit: Payer: PPO | Admitting: Neurosurgery

## 2022-05-09 ENCOUNTER — Encounter: Payer: PPO | Admitting: Speech Pathology

## 2022-05-09 ENCOUNTER — Ambulatory Visit: Payer: PPO

## 2022-05-17 DIAGNOSIS — F0390 Unspecified dementia without behavioral disturbance: Secondary | ICD-10-CM | POA: Diagnosis not present

## 2022-05-22 ENCOUNTER — Ambulatory Visit: Payer: PPO | Admitting: Neurosurgery

## 2022-05-22 ENCOUNTER — Encounter: Payer: Self-pay | Admitting: Neurosurgery

## 2022-05-22 VITALS — BP 156/81 | HR 65 | Temp 98.1°F | Ht 62.0 in | Wt 158.4 lb

## 2022-05-22 DIAGNOSIS — R300 Dysuria: Secondary | ICD-10-CM | POA: Diagnosis not present

## 2022-05-22 DIAGNOSIS — S065XAA Traumatic subdural hemorrhage with loss of consciousness status unknown, initial encounter: Secondary | ICD-10-CM

## 2022-05-22 NOTE — Progress Notes (Signed)
   DOS: 12/18/21 (R Craniotomy for SDH)  HISTORY OF PRESENT ILLNESS: 05/22/2022 [name] is status post craniotomy for subdural hematoma.  She recently had a setback with a urinary tract infection.  She is working with Dr. Manuella Ghazi about her cognitive decline.  PHYSICAL EXAMINATION:   Vitals:   05/22/22 1152  BP: (!) 156/81  Pulse: 65  Temp: 98.1 F (36.7 C)   General: Patient is well developed, well nourished, calm, collected, and in no apparent distress.  NEUROLOGICAL:  General: In no acute distress.  Awake, alert, oriented to person, place, and time. Pupils equal round and reactive to light.  She has no facial asymmetry.  Tongue protrusion is midline.  She has no pronator drift.  She moves all extremities with full strength.  Incision c/d/i   ROS (Neurologic): Negative except as noted above  IMAGING: CT head from May 01, 2022 shows no significant residual subdural hematoma  ASSESSMENT/PLAN:  Kayla Gray is doing well after evacuation of subdural hematoma.  I am pleased with her improvements.  I will see her back on an as-needed basis.  I spent a total of 15 minutes in face-to-face and non-face-to-face activities related to this patient's care today.   Meade Maw MD, [facility] Department of Neurosurgery

## 2022-06-03 NOTE — Progress Notes (Unsigned)
Cardiology Office Note  Date:  06/04/2022   ID:  Kayla Gray, DOB 06/21/1936, MRN [id number]  PCP:  Derinda Late, MD   Chief Complaint  Patient presents with   3-4 month follow up     "Doing well." Patient would like to discuss taking Aspirin and Biotin. Medications reviewed by the patient verbally.     HPI:  Kayla Gray is a very pleasant 86 year old woman with history of  severe aortic valve stenosis, status post AVR with bioprosthetic valve 09/16/2015 postoperative atrial fibrillation, started on amiodarone, warfarin syncope (stumbled backwards, fell back hit head, 2013),  hyperlipidemia,  iron deficiency anemia, peptic ulcer disease Status post left total knee replacement 11/15/2011 SVT in February 2013 admitted  elevated troponin felt secondary to demand ischemia from tachycardia.  February 2015 with TIA-type symptoms, 30 day monitor did not show significant arrhythmia Cardiac cath 08/2015: no CAD Alzheimer's dementia She presents today for  Syncope, SVT and bioprosthetic aortic valve  Last seen in clinic by myself in January 2023  In the hospital May 01, 2022 with dizziness, diagnosed with urinary tract infection   admitted to [facility] on 12/18[facility]21 after a fall while mopping the floor with subsequent injury to the back of the head and syncope.  She was found to have 1.3 cm right SDH with leftward shift and was taken to the OR for right craniotomy and evacuation of his DH by Dr. Cari Caraway on the same day.  05/01/22: in the ER diarrhea UTI, given abx Stopped aricept out of concern for side effects  Sedentary, uses a cane No regular exercise program  Zio monitor results reviewed in detail Normal sinus rhythm Patient had a min HR of 52 bpm, max HR of 211 bpm, and avg HR of 77 bpm. Bundle Branch Block/IVCD was present.    2 Ventricular Tachycardia runs occurred, the run with the fastest interval lasting 4 beats with a max rate of 176 bpm, the longest lasting 5 beats  with an avg rate of 139 bpm.    442 Supraventricular Tachycardia/atrial tachycardia runs occurred, the run with the fastest interval lasting 2 mins 1 sec with a max rate of 211 bpm (avg 144 bpm); the run with the fastest  interval was also the longest.   Isolated SVEs were rare (<1.0%), SVE Couplets were rare (<1.0%), and SVE Triplets were rare (<1.0%). Isolated VEs were rare (<1.0%), VE Couplets were rare (<1.0%), and no VE Triplets were present.  Echo 3/23 reviewed  1. Left ventricular ejection fraction, by estimation, is 60 to 65%. The  left ventricle has normal function. The left ventricle has no regional  wall motion abnormalities. Left ventricular diastolic parameters are  consistent with Grade I diastolic  dysfunction (impaired relaxation).   2. Right ventricular systolic function is normal. The right ventricular  size is normal. Tricuspid regurgitation signal is inadequate for assessing  PA pressure.   3. The mitral valve is normal in structure. Mild mitral valve  regurgitation. No evidence of mitral stenosis.   4. The aortic valve has been repaired/replaced. Bioprosthetic valve  appears well functioning. Aortic valve regurgitation is not visualized. No  significant aortic stenosis. Aortic valve mean gradient measures 2.0 mmHg.   5. The inferior vena cava is normal in size with greater than 50%  respiratory variability, suggesting right atrial pressure of 3 mmHg.   No recent visits to the hospital  Last echocardiogram January 2021 Normal ejection fraction, blood pressures, valve working well  Having issues with arthritis, knees,  hips Unable to exercise No recent falls  Denies any tachycardia or palpitations concerning for atrial fibrillation or SVT Tolerating metoprolol 25 twice daily  Lab work reviewed with her in detail  on zetia Off pravastatin  Recent lab work reviewed from primary care Total cholesterol 170 LDL 98  EKG personally reviewed by myself on todays  visit Normal sinus rhythm rate 73 bpm no significant ST-T wave changes  Other past medical history reviewed  October 20th, 2017  syncopal episode , out having supper at [employer]. Went to bathroom. Had abdominal pain, urgency, felt that she had to have a bowel movement.   lightheadedness, everything went white prior to having her bowel movement. Found herself on the floor, had released her bowels  emerg[facility]   brain CT scan was negative. Seen by neurology, had EEG performed   MRI  12/7    postoperative atrial fibrillation, started on amiodarone, warfarin not had any problems with her anticoagulation  Previous echocardiogram showing Peak velocity 510 cm/s, mean gradient 54 mmHg, peak gradient 104 mmHg.   She had knee replacement surgery early 2016   echocardiogram 01/ 8 2014 showed normal LV function, aortic valve velocity 315 cm/s, peak gradient 56 mm of mercury, mean gradient 19 mm of mercury, estimated aortic valve area 0.61 cm Echocardiogram in late 2014 showing velocity of 400     PMH:   has a past medical history of Aortic stenosis, Bilateral cataracts, Bleeding nose, Breast cancer (Kayla Gray) (1999), Carotid arterial disease (HCC), Chronic diastolic (congestive) heart failure ([place]), Depression, Diverticulosis, Gastroesophageal reflux disease, Heart murmur, History of cardiac catheterization, Hyperlipidemia, Hypertension, Iron deficiency anemia, Obstructive sleep apnea, Osteoarthritis, Osteopenia, Peptic ulcer disease, Personal history of radiation therapy, PSVT (paroxysmal supraventricular tachycardia) ([place]), S/P aortic valve replacement with bioprosthetic valve (09/16/2015), Skin cancer (2020), Stroke [facility]), Subdural hematoma ([place]), Supraventricular tachycardia ([place]), Syncope and collapse, and Urinary tract infection.  PSH:    Past Surgical History:  Procedure Laterality Date   AORTIC VALVE REPLACEMENT N/A 09/16/2015   Procedure: AORTIC VALVE REPLACEMENT (AVR);   Surgeon: Rexene Alberts, MD;  Location: [facility];  Service: Open Heart Surgery;  Laterality: N/A;   BREAST BIOPSY Right 1999   breast ca radation   BREAST EXCISIONAL BIOPSY Left yrs ago    benign   BREAST LUMPECTOMY Right 1999   with radiation   CARDIAC CATHETERIZATION N/A 08/17/2015   Procedure: Left Heart Cath and Coronary Angiography;  Surgeon: Minna Merritts, MD;  Location: [place] [facility];  Service: Cardiovascular;  Laterality: N/A;   CATARACT EXTRACTION, BILATERAL     CHOLECYSTECTOMY     CRANIOTOMY Right 12/18/2021   Procedure: CRANIOTOMY HEMATOMA EVACUATION SUBDURAL;  Surgeon: Meade Maw, MD;  Location: [facility] ORS;  Service: Neurosurgery;  Laterality: Right;   JOINT REPLACEMENT Bilateral    knee replacement   KNEE ARTHROSCOPY     right   KNEE ARTHROSCOPY     left   SKIN CANCER EXCISION  2020   TEE WITHOUT CARDIOVERSION N/A 09/16/2015   Procedure: TRANSESOPHAGEAL ECHOCARDIOGRAM (TEE);  Surgeon: Rexene Alberts, MD;  Location: [facility];  Service: Open Heart Surgery;  Laterality: N/A;   TONSILLECTOMY     VAGINAL HYSTERECTOMY      Current Outpatient Medications  Medication Sig Dispense Refill   acetaminophen (TYLENOL) 325 MG tablet Take 650 mg by mouth every 6 (six) hours as needed.     Biotin 1000 MCG tablet Take 1,000 mcg by mouth daily.  Calcium Carbonate-Vitamin D (CALCIUM 600 + D PO) Take by mouth 2 (two) times daily.     Cyanocobalamin (VITAMIN B 12) 500 MCG TABS Take 500 mg by mouth daily.     ezetimibe (ZETIA) 10 MG tablet Take 10 mg by mouth daily.     fluticasone (FLONASE) 50 MCG/ACT nasal spray Place 2 sprays into the nose daily as needed.     ketoconazole (NIZORAL) 2 % shampoo Apply topically 3 (three) times a week.     metoprolol tartrate (LOPRESSOR) 25 MG tablet Take 1 tablet (25 mg total) by mouth 2 (two) times daily. 180 tablet 0   Multiple Vitamin (MULTIVITAMIN) tablet Take 1 tablet by mouth daily.     PARoxetine (PAXIL) 20 MG tablet Take 20 mg by mouth  daily.     senna (SENOKOT) 8.6 MG TABS tablet Take 1 tablet (8.6 mg total) by mouth 2 (two) times daily. 120 tablet 0   No current facility-administered medications for this visit.    Allergies:   Evista [raloxifene], Sulfa antibiotics, Codeine, Nitrofurantoin, Oxycodone-acetaminophen, and Statins   Social History:  The patient  reports that she has never smoked. She has never used smokeless tobacco. She reports that she does not currently use alcohol. She reports that she does not use drugs.   Family History:   family history includes Breast cancer in her maternal aunt; Breast cancer (age of onset: 71) in her daughter; Heart failure in her mother.    Review of Systems: Review of Systems  Constitutional: Negative.   HENT: Negative.    Respiratory: Negative.    Cardiovascular: Negative.   Gastrointestinal: Negative.   Musculoskeletal: Negative.   Neurological: Negative.   Psychiatric/Behavioral: Negative.    All other systems reviewed and are negative.   PHYSICAL EXAM: VS:  BP 130/70 (BP Location: Left Arm, Patient Position: Sitting, Cuff Size: Normal)   Pulse 73   Ht '5\' 2"'$  (1.575 m)   Wt 162 lb 4 oz (73.6 kg)   SpO2 93%   BMI 29.68 kg/m  , BMI Body mass index is 29.68 kg/m. Constitutional:  oriented to person, place, and time. No distress.  HENT:  Head: Grossly normal Eyes:  no discharge. No scleral icterus.  Neck: No JVD, no carotid bruits  Cardiovascular: Regular rate and rhythm, no murmurs appreciated Pulmonary/Chest: Clear to auscultation bilaterally, no wheezes or rails Abdominal: Soft.  no distension.  no tenderness.  Musculoskeletal: Normal range of motion Neurological:  normal muscle tone. Coordination normal. No atrophy Skin: Skin warm and dry Psychiatric: normal affect, pleasant  Recent Labs: 12/20/2021: Magnesium 2.1 12/21/2021: TSH 0.568 12/26/2021: ALT 19 05/01/2022: BUN 12; Creatinine, Ser 0.93; Hemoglobin 12.0; Platelets 120; Potassium 4.8; Sodium 135     Lipid Panel Lab Results  Component Value Date   CHOL 161 12/19/2021   HDL 50 12/19/2021   LDLCALC 93 12/19/2021   TRIG 89 12/19/2021    Wt Readings from Last 3 Encounters:  06/04/22 162 lb 4 oz (73.6 kg)  05/22/22 158 lb 6.4 oz (71.8 kg)  05/01/22 160 lb (72.6 kg)     ASSESSMENT AND PLAN:  SVT (supraventricular tachycardia) ([place]) - Plan: EKG 12-Lead Prior history of SVT.  No recent episodes, Tolerating metoprolol tartrate 25 twice daily  Paroxysmal atrial fibrillation (HCC) - Plan: EKG 12-Lead Off warfarin Denies atrial fibrillation symptoms, on metoprolol  Syncope, unspecified syncope type - Plan: EKG 12-Lead  Remote vasovagal event in the setting of abdominal pain, bowel urgency Prior syncope with defecation No  recent episodes of near syncope or syncope  Severe aortic valve stenosis S/P aortic valve replacement with bioprosthetic valve Prior echocardiogram reviewed, stable valve    Total encounter time more than 30 minutes  Greater than 50% was spent in counseling and coordination of care with the patient   No orders of the defined types were placed in this encounter.    Signed, Esmond Plants, M.D., Ph.D. 06/04/2022  [place], [place]

## 2022-06-04 ENCOUNTER — Ambulatory Visit: Payer: PPO | Admitting: Cardiovascular Disease

## 2022-06-04 ENCOUNTER — Encounter: Payer: Self-pay | Admitting: Cardiovascular Disease

## 2022-06-04 VITALS — BP 130/70 | HR 73 | Ht 62.0 in | Wt 162.2 lb

## 2022-06-04 DIAGNOSIS — I350 Nonrheumatic aortic (valve) stenosis: Secondary | ICD-10-CM | POA: Diagnosis not present

## 2022-06-04 DIAGNOSIS — I480 Paroxysmal atrial fibrillation: Secondary | ICD-10-CM | POA: Diagnosis not present

## 2022-06-04 DIAGNOSIS — S065XAD Traumatic subdural hemorrhage with loss of consciousness status unknown, subsequent encounter: Secondary | ICD-10-CM

## 2022-06-04 DIAGNOSIS — I471 Supraventricular tachycardia: Secondary | ICD-10-CM

## 2022-06-04 DIAGNOSIS — I060 Rheumatic aortic stenosis: Secondary | ICD-10-CM

## 2022-06-04 DIAGNOSIS — Z953 Presence of xenogenic heart valve: Secondary | ICD-10-CM

## 2022-06-04 DIAGNOSIS — R55 Syncope and collapse: Secondary | ICD-10-CM | POA: Diagnosis not present

## 2022-06-04 DIAGNOSIS — I5032 Chronic diastolic (congestive) heart failure: Secondary | ICD-10-CM

## 2022-06-04 DIAGNOSIS — I6310 Cerebral infarction due to embolism of unspecified precerebral artery: Secondary | ICD-10-CM | POA: Diagnosis not present

## 2022-06-04 DIAGNOSIS — S065XAA Traumatic subdural hemorrhage with loss of consciousness status unknown, initial encounter: Secondary | ICD-10-CM | POA: Diagnosis not present

## 2022-06-04 MED ORDER — ASPIRIN 81 MG PO TBEC: 81.0000 mg | DELAYED_RELEASE_TABLET | Freq: Every day | ORAL | 3 refills | Status: AC

## 2022-06-04 NOTE — Patient Instructions (Signed)
Medication Instructions:  No changes  If you need a refill on your cardiac medications before your next appointment, please call your pharmacy.   Lab work: No new labs needed  Testing/Procedures: No new testing needed  Follow-Up: At [facility], you and your health needs are our priority.  As part of our continuing mission to provide you with exceptional heart care, we have created designated Provider Care Teams.  These Care Teams include your primary Cardiologist (physician) and Advanced Practice Providers (APPs -  Physician Assistants and Nurse Practitioners) who all work together to provide you with the care you need, when you need it.  You will need a follow up appointment in 12 months  Providers on your designated Care Team:   Christopher Berge, NP Ryan Dunn, PA-C Cadence Furth, PA-C  COVID-19 Vaccine Information can be found at: [email or web address] For questions related to vaccine distribution or appointments, please email [email or web address] or call [phone].   

## 2022-07-17 DIAGNOSIS — Z85828 Personal history of other malignant neoplasm of skin: Secondary | ICD-10-CM | POA: Diagnosis not present

## 2022-07-17 DIAGNOSIS — D2261 Melanocytic nevi of right upper limb, including shoulder: Secondary | ICD-10-CM | POA: Diagnosis not present

## 2022-07-17 DIAGNOSIS — D2271 Melanocytic nevi of right lower limb, including hip: Secondary | ICD-10-CM | POA: Diagnosis not present

## 2022-07-17 DIAGNOSIS — D485 Neoplasm of uncertain behavior of skin: Secondary | ICD-10-CM | POA: Diagnosis not present

## 2022-07-17 DIAGNOSIS — L820 Inflamed seborrheic keratosis: Secondary | ICD-10-CM | POA: Diagnosis not present

## 2022-07-17 DIAGNOSIS — D2272 Melanocytic nevi of left lower limb, including hip: Secondary | ICD-10-CM | POA: Diagnosis not present

## 2022-07-17 DIAGNOSIS — X32XXXA Exposure to sunlight, initial encounter: Secondary | ICD-10-CM | POA: Diagnosis not present

## 2022-07-17 DIAGNOSIS — L570 Actinic keratosis: Secondary | ICD-10-CM | POA: Diagnosis not present

## 2022-07-28 ENCOUNTER — Other Ambulatory Visit: Payer: Self-pay | Admitting: Nurse Practitioner

## 2022-08-03 DIAGNOSIS — R2689 Other abnormalities of gait and mobility: Secondary | ICD-10-CM | POA: Diagnosis not present

## 2022-08-03 DIAGNOSIS — Z8673 Personal history of transient ischemic attack (TIA), and cerebral infarction without residual deficits: Secondary | ICD-10-CM | POA: Diagnosis not present

## 2022-08-03 DIAGNOSIS — Z8679 Personal history of other diseases of the circulatory system: Secondary | ICD-10-CM | POA: Diagnosis not present

## 2022-08-03 DIAGNOSIS — Z9889 Other specified postprocedural states: Secondary | ICD-10-CM | POA: Diagnosis not present

## 2022-08-03 DIAGNOSIS — F0390 Unspecified dementia without behavioral disturbance: Secondary | ICD-10-CM | POA: Diagnosis not present

## 2022-08-03 DIAGNOSIS — Z8659 Personal history of other mental and behavioral disorders: Secondary | ICD-10-CM | POA: Diagnosis not present

## 2022-08-08 DIAGNOSIS — F330 Major depressive disorder, recurrent, mild: Secondary | ICD-10-CM | POA: Diagnosis not present

## 2022-08-08 DIAGNOSIS — I482 Chronic atrial fibrillation, unspecified: Secondary | ICD-10-CM | POA: Diagnosis not present

## 2022-08-08 DIAGNOSIS — Z6829 Body mass index (BMI) 29.0-29.9, adult: Secondary | ICD-10-CM | POA: Diagnosis not present

## 2022-08-31 DIAGNOSIS — R350 Frequency of micturition: Secondary | ICD-10-CM | POA: Diagnosis not present

## 2022-08-31 DIAGNOSIS — N390 Urinary tract infection, site not specified: Secondary | ICD-10-CM | POA: Diagnosis not present

## 2022-08-31 DIAGNOSIS — R300 Dysuria: Secondary | ICD-10-CM | POA: Diagnosis not present

## 2022-10-03 ENCOUNTER — Other Ambulatory Visit: Payer: Self-pay | Admitting: Family Medicine

## 2022-10-03 DIAGNOSIS — Z1231 Encounter for screening mammogram for malignant neoplasm of breast: Secondary | ICD-10-CM

## 2022-11-05 DIAGNOSIS — E7800 Pure hypercholesterolemia, unspecified: Secondary | ICD-10-CM | POA: Diagnosis not present

## 2022-11-05 DIAGNOSIS — R300 Dysuria: Secondary | ICD-10-CM | POA: Diagnosis not present

## 2022-11-05 DIAGNOSIS — R8290 Unspecified abnormal findings in urine: Secondary | ICD-10-CM | POA: Diagnosis not present

## 2022-11-05 DIAGNOSIS — I700 Atherosclerosis of aorta: Secondary | ICD-10-CM | POA: Diagnosis not present

## 2022-11-05 DIAGNOSIS — Z79899 Other long term (current) drug therapy: Secondary | ICD-10-CM | POA: Diagnosis not present

## 2022-11-12 ENCOUNTER — Ambulatory Visit
Admission: RE | Admit: 2022-11-12 | Discharge: 2022-11-12 | Disposition: A | Discharge status: Home / Self Care | Payer: PPO | Source: Ambulatory Visit | Attending: Family Medicine | Admitting: Family Medicine

## 2022-11-12 DIAGNOSIS — F0390 Unspecified dementia without behavioral disturbance: Secondary | ICD-10-CM | POA: Diagnosis not present

## 2022-11-12 DIAGNOSIS — Z0000 Encounter for general adult medical examination without abnormal findings: Secondary | ICD-10-CM | POA: Diagnosis not present

## 2022-11-12 DIAGNOSIS — Z1231 Encounter for screening mammogram for malignant neoplasm of breast: Secondary | ICD-10-CM | POA: Diagnosis not present

## 2022-12-09 ENCOUNTER — Other Ambulatory Visit: Payer: Self-pay

## 2022-12-09 ENCOUNTER — Encounter: Payer: Self-pay | Admitting: Emergency Medicine

## 2022-12-09 ENCOUNTER — Emergency Department
Admission: EM | Admit: 2022-12-09 | Discharge: 2022-12-09 | Disposition: A | Discharge status: Home / Self Care | Payer: PPO | Attending: Emergency Medicine | Admitting: Emergency Medicine

## 2022-12-09 ENCOUNTER — Emergency Department: Payer: PPO

## 2022-12-09 DIAGNOSIS — I110 Hypertensive heart disease with heart failure: Secondary | ICD-10-CM | POA: Diagnosis not present

## 2022-12-09 DIAGNOSIS — N390 Urinary tract infection, site not specified: Secondary | ICD-10-CM | POA: Insufficient documentation

## 2022-12-09 DIAGNOSIS — R109 Unspecified abdominal pain: Secondary | ICD-10-CM | POA: Diagnosis not present

## 2022-12-09 DIAGNOSIS — I5030 Unspecified diastolic (congestive) heart failure: Secondary | ICD-10-CM | POA: Insufficient documentation

## 2022-12-09 DIAGNOSIS — I700 Atherosclerosis of aorta: Secondary | ICD-10-CM | POA: Diagnosis not present

## 2022-12-09 LAB — COMPREHENSIVE METABOLIC PANEL
ALT: 15 U/L (ref 0–44)
AST: 31 U/L (ref 15–41)
Albumin: 3.7 g/dL (ref 3.5–5.0)
Alkaline Phosphatase: 60 U/L (ref 38–126)
Anion gap: 9 (ref 5–15)
BUN: 19 mg/dL (ref 8–23)
CO2: 26 mmol/L (ref 22–32)
Calcium: 9 mg/dL (ref 8.9–10.3)
Chloride: 101 mmol/L (ref 98–111)
Creatinine, Ser: 1.1 mg/dL — ABNORMAL HIGH (ref 0.44–1.00)
GFR, Estimated: 50 mL/min — ABNORMAL LOW (ref >60)
Glucose, Bld: 101 mg/dL — ABNORMAL HIGH (ref 70–99)
Potassium: 4 mmol/L (ref 3.5–5.1)
Sodium: 136 mmol/L (ref 135–145)
Total Bilirubin: 0.9 mg/dL (ref 0.3–1.2)
Total Protein: 7.5 g/dL (ref 6.5–8.1)

## 2022-12-09 LAB — URINALYSIS, ROUTINE W REFLEX MICROSCOPIC
Bilirubin Urine: NEGATIVE
Glucose, UA: NEGATIVE mg/dL
Hgb urine dipstick: NEGATIVE
Ketones, ur: 5 mg/dL — AB
Nitrite: NEGATIVE
Protein, ur: 100 mg/dL — AB
Specific Gravity, Urine: 1.024 (ref 1.005–1.030)
WBC, UA: >50 WBC/hpf (ref 0–5)
pH: 5 (ref 5.0–8.0)

## 2022-12-09 LAB — CBC
HCT: 40 % (ref 36.0–46.0)
Hemoglobin: 12.6 g/dL (ref 12.0–15.0)
MCH: 27.6 pg (ref 26.0–34.0)
MCHC: 31.5 g/dL (ref 30.0–36.0)
MCV: 87.7 fL (ref 80.0–100.0)
Platelets: 161 10*3/uL (ref 150–400)
RBC: 4.56 MIL/uL (ref 3.87–5.11)
RDW: 13.9 % (ref 11.5–15.5)
WBC: 6.3 10*3/uL (ref 4.0–10.5)
nRBC: 0 % (ref 0.0–0.2)

## 2022-12-09 LAB — LIPASE, BLOOD: Lipase: 30 U/L (ref 11–51)

## 2022-12-09 MED ORDER — IOHEXOL 300 MG/ML  SOLN
100.0000 mL | Freq: Once | INTRAMUSCULAR | Status: AC | PRN
  Administered 2022-12-09: 100 mL via INTRAVENOUS

## 2022-12-09 MED ORDER — SODIUM CHLORIDE 0.9 % IV SOLN
1.0000 g | Freq: Once | INTRAVENOUS | Status: AC
  Administered 2022-12-09: 1 g via INTRAVENOUS
  Filled 2022-12-09: qty 10

## 2022-12-09 MED ORDER — CEPHALEXIN 500 MG PO CAPS: 500.0000 mg | ORAL_CAPSULE | Freq: Two times a day (BID) | ORAL | 0 refills | Status: AC

## 2022-12-09 NOTE — ED Notes (Signed)
Pt ambulated to toilet and back to bed.

## 2022-12-09 NOTE — ED Notes (Signed)
Pt states she has hx of diverticulosis and last BM was yesterday but not that much. Pt states she has taken tylenol for pain yesterday and senna for constipation.Marland Kitchen

## 2022-12-09 NOTE — Discharge Instructions (Addendum)
Take the antibiotic as prescribed and finish the full 1 week course.  Return to the ER for new, worsening, or persistent severe pain, vomiting, fever, or any other new or worsening symptoms that concern you.

## 2022-12-09 NOTE — ED Provider Notes (Signed)
[facility] Provider Note    Event Date/Time   First MD Initiated Contact with Patient 12/09/22 1342     (approximate)   History   Abdominal Pain   HPI  Kayla Gray is a 87 y.o. female with a history of cognitive impairment, aortic stenosis, diastolic CHF, hypertension, hyperlipidemia, GERD, and paroxysmal SVT who presents with left lower abdominal pain, acute onset yesterday, persistent since then, associated with mild diarrhea.  The patient has also had some dysuria and frequency.  She denies hematuria.  She has no fever or vomiting.  She states that the pain feels similar to when she has had diverticulitis previously.  I reviewed the past medical records.  The patient's most recent outpatient counter was on 1/29 with internal medicine for routine follow-up.  She has had no ED visits or admissions in the last several months.  Physical Exam   Triage Vital Signs: ED Triage Vitals  Enc Vitals Group     BP 12/09/22 1216 (!) 139/93     Pulse Rate 12/09/22 1215 87     Resp 12/09/22 1216 20     Temp 12/09/22 1215 98 F (36.7 C)     Temp src --      SpO2 12/09/22 1216 93 %     Weight 12/09/22 1214 160 lb (72.6 kg)     Height 12/09/22 1214 '5\' 2"'$  (1.575 m)     Head Circumference --      Peak Flow --      Pain Score 12/09/22 1214 7     Pain Loc --      Pain Edu? --      Excl. in [place]? --     Most recent vital signs: Vitals:   12/09/22 1341 12/09/22 1656  BP: 119/73 (!) 145/76  Pulse: 73 66  Resp: 20 18  Temp:    SpO2: 100% 100%     General: Awake, no distress.  CV:  Good peripheral perfusion.  Resp:  Normal effort.  Abd:  No distention.  Mild left lower quadrant tenderness. Other:  No jaundice or scleral icterus.   ED Results / Procedures / Treatments   Labs (all labs ordered are listed, but only abnormal results are displayed) Labs Reviewed  COMPREHENSIVE METABOLIC PANEL - Abnormal; Notable for the following components:      Result  Value   Glucose, Bld 101 (*)    Creatinine, Ser 1.10 (*)    GFR, Estimated 50 (*)    All other components within normal limits  URINALYSIS, ROUTINE W REFLEX MICROSCOPIC - Abnormal; Notable for the following components:   Color, Urine AMBER (*)    APPearance CLOUDY (*)    Ketones, ur 5 (*)    Protein, ur 100 (*)    Leukocytes,Ua MODERATE (*)    Bacteria, UA RARE (*)    All other components within normal limits  LIPASE, BLOOD  CBC     EKG     RADIOLOGY  ED abdomen/pelvis: I independently viewed and interpreted the images; there are no dilated bowel loops or any free air or free fluid.  Radiology report indicates:  IMPRESSION:  No acute findings.    Stable findings of chronic cystitis. Suggest correlation with  urinalysis.    Colonic diverticulosis, without radiographic evidence of  diverticulitis.    PROCEDURES:  Critical Care performed: No  Procedures   MEDICATIONS ORDERED IN ED: Medications  iohexol (OMNIPAQUE) 300 MG/ML solution 100 mL (100 mLs Intravenous Contrast  Given 12/09/22 1520)  cefTRIAXone (ROCEPHIN) 1 g in sodium chloride 0.9 % 100 mL IVPB (0 g Intravenous Stopped 12/09/22 1652)     IMPRESSION / MDM / ASSESSMENT AND PLAN / ED COURSE  I reviewed the triage vital signs and the nursing notes.  87 year old female with PMH as noted above presents with left lower quadrant abdominal pain associated with some diarrhea as well as urinary symptoms.  Initial lab work shows urinalysis with greater than 50 WBCs and some bacteria.  However there is no leukocytosis and electrolytes are normal.  Differential diagnosis includes, but is not limited to, diverticulitis, colitis, enteritis, UTI/pyelonephritis, musculoskeletal pain.  Will obtain CT for further evaluation.  Patient's presentation is most consistent with acute presentation with potential threat to life or bodily function.  ----------------------------------------- 5:13 PM on  12/09/2022 -----------------------------------------  CT shows no evidence of diverticulitis or other acute findings.  There is evidence of chronic cystitis.  Given the acute pain as well as the urinalysis findings I will treat for UTI.  I do not suspect pyelonephritis given that the pain is really more in the abdomen than the flank.  It is more consistent with acute on chronic cystitis.  Given that the patient is having acute urinary symptoms, we will treat for acute UTI.  I have given a dose of ceftriaxone in the ED and prescribed a week of Keflex.  I counseled the patient on the results of the workup and plan of care.  I gave her strict return precautions and she expressed understanding.     FINAL CLINICAL IMPRESSION(S) / ED DIAGNOSES   Final diagnoses:  Urinary tract infection without hematuria, site unspecified     Rx / DC Orders   ED Discharge Orders          Ordered    cephALEXin (KEFLEX) 500 MG capsule  2 times daily        12/09/22 1651             Note:  This document was prepared using Dragon voice recognition software and may include unintentional dictation errors.    Arta Silence, MD 12/09/22 1728

## 2022-12-09 NOTE — ED Triage Notes (Signed)
Pt via POV from home. Pt c/o RLQ pain, lower back pain, and discomfort when she urinates starting yesterday. Denies fever. Denies NVD. Pt is A&OX4 and NAD. Ambulatory to triage.

## 2022-12-14 ENCOUNTER — Telehealth: Payer: Self-pay

## 2022-12-14 NOTE — Telephone Encounter (Signed)
     Patient  visit on 2/25  at [facility] Have you been able to follow up with your primary care physician? Yes   The patient was or was not able to obtain any needed medicine or equipment. Yes   Are there diet recommendations that you are having difficulty following? Na   Patient expresses understanding of discharge instructions and education provided has no other needs at this time.  Yes     [facility] [phone] [address], [place], [place] [address] Phone: [phone] Email: [email or web address]

## 2023-01-22 DIAGNOSIS — D2262 Melanocytic nevi of left upper limb, including shoulder: Secondary | ICD-10-CM | POA: Diagnosis not present

## 2023-01-22 DIAGNOSIS — D2261 Melanocytic nevi of right upper limb, including shoulder: Secondary | ICD-10-CM | POA: Diagnosis not present

## 2023-01-22 DIAGNOSIS — L570 Actinic keratosis: Secondary | ICD-10-CM | POA: Diagnosis not present

## 2023-01-22 DIAGNOSIS — D225 Melanocytic nevi of trunk: Secondary | ICD-10-CM | POA: Diagnosis not present

## 2023-01-22 DIAGNOSIS — D2272 Melanocytic nevi of left lower limb, including hip: Secondary | ICD-10-CM | POA: Diagnosis not present

## 2023-01-22 DIAGNOSIS — D485 Neoplasm of uncertain behavior of skin: Secondary | ICD-10-CM | POA: Diagnosis not present

## 2023-01-22 DIAGNOSIS — Z85828 Personal history of other malignant neoplasm of skin: Secondary | ICD-10-CM | POA: Diagnosis not present

## 2023-01-22 DIAGNOSIS — L280 Lichen simplex chronicus: Secondary | ICD-10-CM | POA: Diagnosis not present

## 2023-03-15 ENCOUNTER — Other Ambulatory Visit: Payer: Self-pay | Admitting: Cardiovascular Disease

## 2023-03-15 DIAGNOSIS — N390 Urinary tract infection, site not specified: Secondary | ICD-10-CM | POA: Diagnosis not present

## 2023-03-15 DIAGNOSIS — R300 Dysuria: Secondary | ICD-10-CM | POA: Diagnosis not present

## 2023-03-24 ENCOUNTER — Other Ambulatory Visit: Payer: Self-pay | Admitting: Cardiovascular Disease

## 2023-05-06 DIAGNOSIS — N1831 Chronic kidney disease, stage 3a: Secondary | ICD-10-CM | POA: Diagnosis not present

## 2023-05-06 DIAGNOSIS — E7800 Pure hypercholesterolemia, unspecified: Secondary | ICD-10-CM | POA: Diagnosis not present

## 2023-05-08 DIAGNOSIS — R8290 Unspecified abnormal findings in urine: Secondary | ICD-10-CM | POA: Diagnosis not present

## 2023-05-08 DIAGNOSIS — M545 Low back pain, unspecified: Secondary | ICD-10-CM | POA: Diagnosis not present

## 2023-05-20 DIAGNOSIS — F0390 Unspecified dementia without behavioral disturbance: Secondary | ICD-10-CM | POA: Diagnosis not present

## 2023-05-20 DIAGNOSIS — N1831 Chronic kidney disease, stage 3a: Secondary | ICD-10-CM | POA: Diagnosis not present

## 2023-05-20 DIAGNOSIS — E7800 Pure hypercholesterolemia, unspecified: Secondary | ICD-10-CM | POA: Diagnosis not present

## 2023-05-20 DIAGNOSIS — Z79899 Other long term (current) drug therapy: Secondary | ICD-10-CM | POA: Diagnosis not present

## 2023-05-20 DIAGNOSIS — I480 Paroxysmal atrial fibrillation: Secondary | ICD-10-CM | POA: Diagnosis not present

## 2023-05-20 DIAGNOSIS — I10 Essential (primary) hypertension: Secondary | ICD-10-CM | POA: Diagnosis not present

## 2023-05-20 DIAGNOSIS — I700 Atherosclerosis of aorta: Secondary | ICD-10-CM | POA: Diagnosis not present

## 2023-06-10 NOTE — Progress Notes (Unsigned)
Cardiology Office Note  Date:  06/11/2023   ID:  Kayla Gray, DOB 1935/10/30, MRN [id number]  PCP:  Kandyce Rud, MD   Chief Complaint  Patient presents with   12 month follow up     "Doing well." Medications reviewed by the patient verbally.     HPI:  Kayla Gray is a very pleasant 87 year old woman with history of  severe aortic valve stenosis, status post AVR with bioprosthetic valve 09/16/2015 postoperative atrial fibrillation, started on amiodarone, warfarin syncope (stumbled backwards, fell back hit head, 2013),  hyperlipidemia,  iron deficiency anemia, peptic ulcer disease Status post left total knee replacement 11/15/2011 SVT in February 2013 admitted  elevated troponin felt secondary to demand ischemia from tachycardia.  February 2015 with TIA-type symptoms, 30 day monitor did not show significant arrhythmia Cardiac cath 08/2015: no CAD Alzheimer's dementia EF 60 to 65% March 2023 fall with head injury, status post right craniotomy and evacuation She presents today for  Syncope, SVT and bioprosthetic aortic valve  Last seen in clinic by myself in August 2023 Lives with husband age 46  Sedentary at baseline, no regular walking or exercise program Reports that she gave up her driver's license but husband still drives  Does not like to use her cane or walker Denies recent falls  echocardiogram March 2023, stable bioprosthetic aortic valve EF 60 to 65%  In the hospital May 01, 2022 with dizziness, diagnosed with urinary tract infection   admitted to [facility] on 12/18/2021 after a fall while mopping the floor with subsequent injury to the back of the head and syncope.  She was found to have 1.3 cm right SDH with leftward shift and was taken to the OR for right craniotomy and evacuation  by Dr. Marcell Barlow   05/01/22: in the ER diarrhea UTI, given abx Stopped aricept out of concern for side effects  EKG personally reviewed by myself on todays visit EKG  Interpretation Date/Time:  Tuesday June 11 2023 10:13:31 EDT Ventricular Rate:  70 PR Interval:  188 QRS Duration:  92 QT Interval:  376 QTC Calculation: 406 R Axis:   -31  Text Interpretation: Normal sinus rhythm Left axis deviation Moderate voltage criteria for LVH, may be normal variant ( R in aVL , Cornell product ) When compared with ECG of 01-May-2022 14:39, Premature atrial complexes are no longer Present QRS axis Shifted left Confirmed by Julien Nordmann [phone]) on 06/11/2023 10:43:13 AM   Zio monitor Normal sinus rhythm Patient had a min HR of 52 bpm, max HR of 211 bpm, and avg HR of 77 bpm. Bundle Branch Block/IVCD was present.    2 Ventricular Tachycardia runs occurred, the run with the fastest interval lasting 4 beats with a max rate of 176 bpm, the longest lasting 5 beats with an avg rate of 139 bpm.    442 Supraventricular Tachycardia/atrial tachycardia runs occurred, the run with the fastest interval lasting 2 mins 1 sec with a max rate of 211 bpm (avg 144 bpm); the run with the fastest  interval was also the longest.   Isolated SVEs were rare (<1.0%), SVE Couplets were rare (<1.0%), and SVE Triplets were rare (<1.0%). Isolated VEs were rare (<1.0%), VE Couplets were rare (<1.0%), and no VE Triplets were present.   Other past medical history reviewed  October 20th, 2017  syncopal episode , out having supper at [employer]. Went to bathroom. Had abdominal pain, urgency, felt that she had to have a bowel movement.   lightheadedness, everything  went white prior to having her bowel movement. Found herself on the floor, had released her bowels  emergency department at [facility]   brain CT scan was negative. Seen by neurology, had EEG performed   MRI  12/7    postoperative atrial fibrillation, started on amiodarone, warfarin not had any problems with her anticoagulation  echocardiogram 01/ 8 2014 showed normal LV function, aortic valve velocity 315 cm/s, peak gradient 56 mm  of mercury, mean gradient 19 mm of mercury, estimated aortic valve area 0.61 cm Echocardiogram in late 2014 showing velocity of 400     PMH:   has a past medical history of Aortic stenosis, Bilateral cataracts, Bleeding nose, Breast cancer (HCC) (1999), Carotid arterial disease (HCC), Chronic diastolic (congestive) heart failure (HCC), Depression, Diverticulosis, Gastroesophageal reflux disease, Heart murmur, History of cardiac catheterization, Hyperlipidemia, Hypertension, Iron deficiency anemia, Obstructive sleep apnea, Osteoarthritis, Osteopenia, Peptic ulcer disease, Personal history of radiation therapy, PSVT (paroxysmal supraventricular tachycardia), S/P aortic valve replacement with bioprosthetic valve (09/16/2015), Skin cancer (2020), Stroke [facility]), Subdural hematoma (HCC), Supraventricular tachycardia, Syncope and collapse, and Urinary tract infection.  PSH:    Past Surgical History:  Procedure Laterality Date   AORTIC VALVE REPLACEMENT N/A 09/16/2015   Procedure: AORTIC VALVE REPLACEMENT (AVR);  Surgeon: Purcell Nails, MD;  Location: [facility] OR;  Service: Open Heart Surgery;  Laterality: N/A;   BREAST BIOPSY Right 1999   breast ca radation   BREAST EXCISIONAL BIOPSY Left yrs ago    benign   BREAST LUMPECTOMY Right 1999   with radiation   CARDIAC CATHETERIZATION N/A 08/17/2015   Procedure: Left Heart Cath and Coronary Angiography;  Surgeon: Antonieta Iba, MD;  Location: [facility] INVASIVE CV LAB;  Service: Cardiovascular;  Laterality: N/A;   CATARACT EXTRACTION, BILATERAL     CHOLECYSTECTOMY     CRANIOTOMY Right 12/18/2021   Procedure: CRANIOTOMY HEMATOMA EVACUATION SUBDURAL;  Surgeon: Venetia Night, MD;  Location: [facility] ORS;  Service: Neurosurgery;  Laterality: Right;   JOINT REPLACEMENT Bilateral    knee replacement   KNEE ARTHROSCOPY     right   KNEE ARTHROSCOPY     left   SKIN CANCER EXCISION  2020   TEE WITHOUT CARDIOVERSION N/A 09/16/2015   Procedure: TRANSESOPHAGEAL  ECHOCARDIOGRAM (TEE);  Surgeon: Purcell Nails, MD;  [facility];  Service: Open Heart Surgery;  Laterality: N/A;   TONSILLECTOMY     VAGINAL HYSTERECTOMY      Current Outpatient Medications  Medication Sig Dispense Refill   acetaminophen (TYLENOL) 325 MG tablet Take 650 mg by mouth every 6 (six) hours as needed.     aspirin EC 81 MG tablet Take 1 tablet (81 mg total) by mouth daily. Swallow whole. 90 tablet 3   Calcium Carbonate-Vitamin D (CALCIUM 600 + D PO) Take by mouth 2 (two) times daily.     ezetimibe (ZETIA) 10 MG tablet Take 10 mg by mouth daily.     fluticasone (FLONASE) 50 MCG/ACT nasal spray Place 2 sprays into the nose daily as needed.     ketoconazole (NIZORAL) 2 % shampoo Apply topically 3 (three) times a week.     metoprolol tartrate (LOPRESSOR) 25 MG tablet TAKE 1 TABLET BY MOUTH TWICE A DAY 180 tablet 0   Multiple Vitamin (MULTIVITAMIN) tablet Take 1 tablet by mouth daily.     PARoxetine (PAXIL) 20 MG tablet Take 20 mg by mouth daily.     senna (SENOKOT) 8.6 MG TABS tablet Take 1 tablet (8.6 mg total) by  mouth 2 (two) times daily. 120 tablet 0   No current facility-administered medications for this visit.    Allergies:   Evista [raloxifene], Sulfa antibiotics, Codeine, Nitrofurantoin, Oxycodone-acetaminophen, and Statins   Social History:  The patient  reports that she has never smoked. She has never used smokeless tobacco. She reports that she does not currently use alcohol. She reports that she does not use drugs.   Family History:   family history includes Breast cancer in her maternal aunt; Breast cancer (age of onset: 21) in her daughter; Heart failure in her mother.    Review of Systems: Review of Systems  Constitutional: Negative.   HENT: Negative.    Respiratory: Negative.    Cardiovascular: Negative.   Gastrointestinal: Negative.   Musculoskeletal: Negative.   Neurological: Negative.   Psychiatric/Behavioral: Negative.    All other systems  reviewed and are negative.   PHYSICAL EXAM: VS:  BP 110/80 (BP Location: Left Arm, Patient Position: Sitting, Cuff Size: Normal)   Pulse 70   Ht 5\' 2"  (1.575 m)   Wt 162 lb 8 oz (73.7 kg)   SpO2 97%   BMI 29.72 kg/m  , BMI Body mass index is 29.72 kg/m. Constitutional:  oriented to person, place, and time. No distress.  HENT:  Head: Grossly normal Eyes:  no discharge. No scleral icterus.  Neck: No JVD, no carotid bruits  Cardiovascular: Regular rate and rhythm, no murmurs appreciated Pulmonary/Chest: Clear to auscultation bilaterally, no wheezes or rails Abdominal: Soft.  no distension.  no tenderness.  Musculoskeletal: Normal range of motion Neurological:  normal muscle tone. Coordination normal. No atrophy Skin: Skin warm and dry Psychiatric: normal affect, pleasant   Recent Labs: 12/09/2022: ALT 15; BUN 19; Creatinine, Ser 1.10; Hemoglobin 12.6; Platelets 161; Potassium 4.0; Sodium 136    Lipid Panel Lab Results  Component Value Date   CHOL 161 12/19/2021   HDL 50 12/19/2021   LDLCALC 93 12/19/2021   TRIG 89 12/19/2021    Wt Readings from Last 3 Encounters:  06/11/23 162 lb 8 oz (73.7 kg)  12/09/22 160 lb (72.6 kg)  06/04/22 162 lb 4 oz (73.6 kg)     ASSESSMENT AND PLAN:  SVT (supraventricular tachycardia) (HCC)  Prior history of SVT.  Denies paroxysmal tachycardia Tolerating metoprolol tartrate 25 twice daily  Paroxysmal atrial fibrillation (HCC) - Plan: EKG 12-Lead Off warfarin given prior notes of syncope, prior fall and subdural hematoma  Denies atrial fibrillation symptoms, on metoprolol  Syncope, unspecified syncope type -  Remote vasovagal event in the setting of abdominal pain, bowel urgency Prior syncope with defecation No recent episodes of near syncope or syncope  Severe aortic valve stenosis S/P aortic valve replacement with bioprosthetic valve stable valve on echocardiogram 2023 Home  Total encounter time more than 30 minutes  Greater  than 50% was spent in counseling and coordination of care with the patient   Orders Placed This Encounter  Procedures   EKG 12-Lead     Signed, Dossie Arbour, M.D., Ph.D. 06/11/2023  [facility] [place], [place] [phone]

## 2023-06-11 ENCOUNTER — Encounter: Payer: Self-pay | Admitting: Cardiovascular Disease

## 2023-06-11 ENCOUNTER — Ambulatory Visit: Payer: PPO | Attending: Cardiovascular Disease | Admitting: Cardiovascular Disease

## 2023-06-11 VITALS — BP 110/80 | HR 70 | Ht 62.0 in | Wt 162.5 lb

## 2023-06-11 DIAGNOSIS — I060 Rheumatic aortic stenosis: Secondary | ICD-10-CM

## 2023-06-11 DIAGNOSIS — S065XAD Traumatic subdural hemorrhage with loss of consciousness status unknown, subsequent encounter: Secondary | ICD-10-CM | POA: Diagnosis not present

## 2023-06-11 DIAGNOSIS — I480 Paroxysmal atrial fibrillation: Secondary | ICD-10-CM | POA: Diagnosis not present

## 2023-06-11 DIAGNOSIS — I5032 Chronic diastolic (congestive) heart failure: Secondary | ICD-10-CM | POA: Diagnosis not present

## 2023-06-11 DIAGNOSIS — R55 Syncope and collapse: Secondary | ICD-10-CM

## 2023-06-11 DIAGNOSIS — Z953 Presence of xenogenic heart valve: Secondary | ICD-10-CM | POA: Diagnosis not present

## 2023-06-11 DIAGNOSIS — S065XAA Traumatic subdural hemorrhage with loss of consciousness status unknown, initial encounter: Secondary | ICD-10-CM

## 2023-06-11 DIAGNOSIS — I471 Supraventricular tachycardia, unspecified: Secondary | ICD-10-CM

## 2023-06-11 MED ORDER — METOPROLOL TARTRATE 25 MG PO TABS: 25.0000 mg | ORAL_TABLET | Freq: Two times a day (BID) | ORAL | 3 refills | Status: DC

## 2023-06-11 NOTE — Patient Instructions (Signed)

## 2023-07-22 IMAGING — MG MM DIGITAL SCREENING BILAT W/ TOMO AND CAD
6 of 10 series · 6 of 30 positions shown · non-contrast
Comparison: Previous exam(s).

CLINICAL DATA: Screening.

EXAM:
DIGITAL SCREENING BILATERAL MAMMOGRAM WITH TOMOSYNTHESIS AND CAD
TECHNIQUE: Bilateral screening digital craniocaudal and mediolateral oblique
mammograms were obtained. Bilateral screening digital breast
tomosynthesis was performed. The images were evaluated with
computer-aided detection.

[R CC synth-2D]
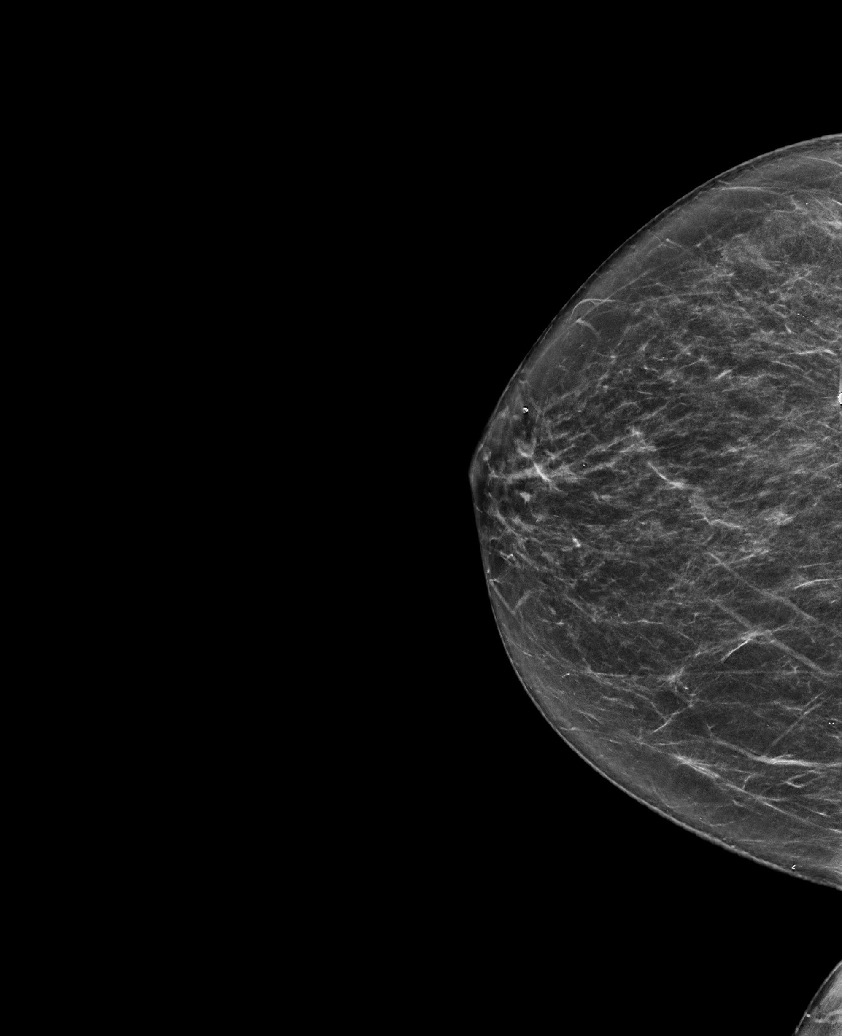

[R XCCL synth-2D]
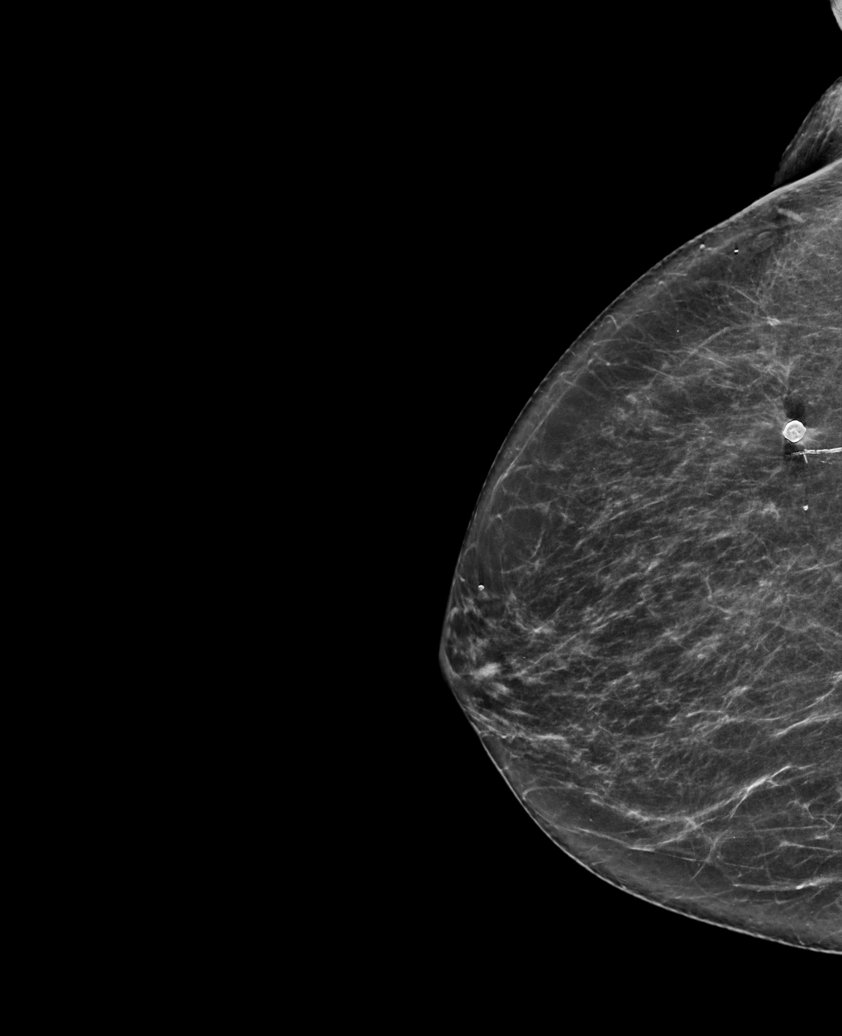

[L CC synth-2D]
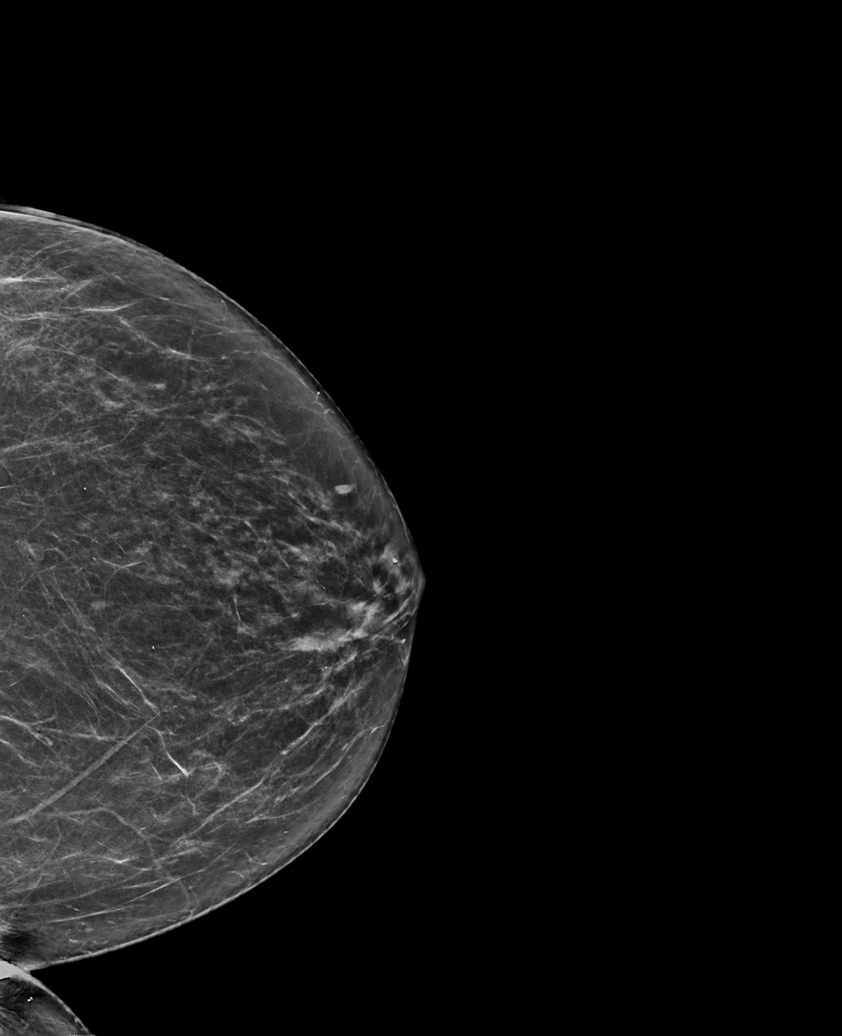

[L MLO synth-2D]
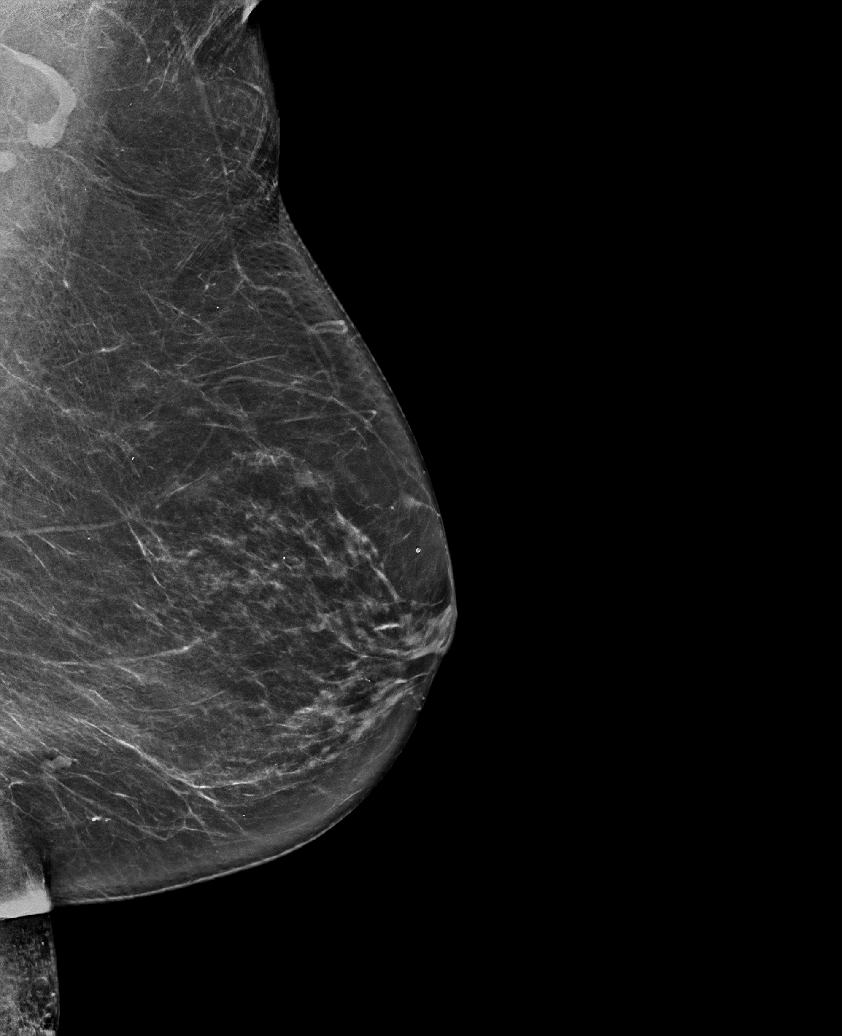

[R MLO synth-2D]
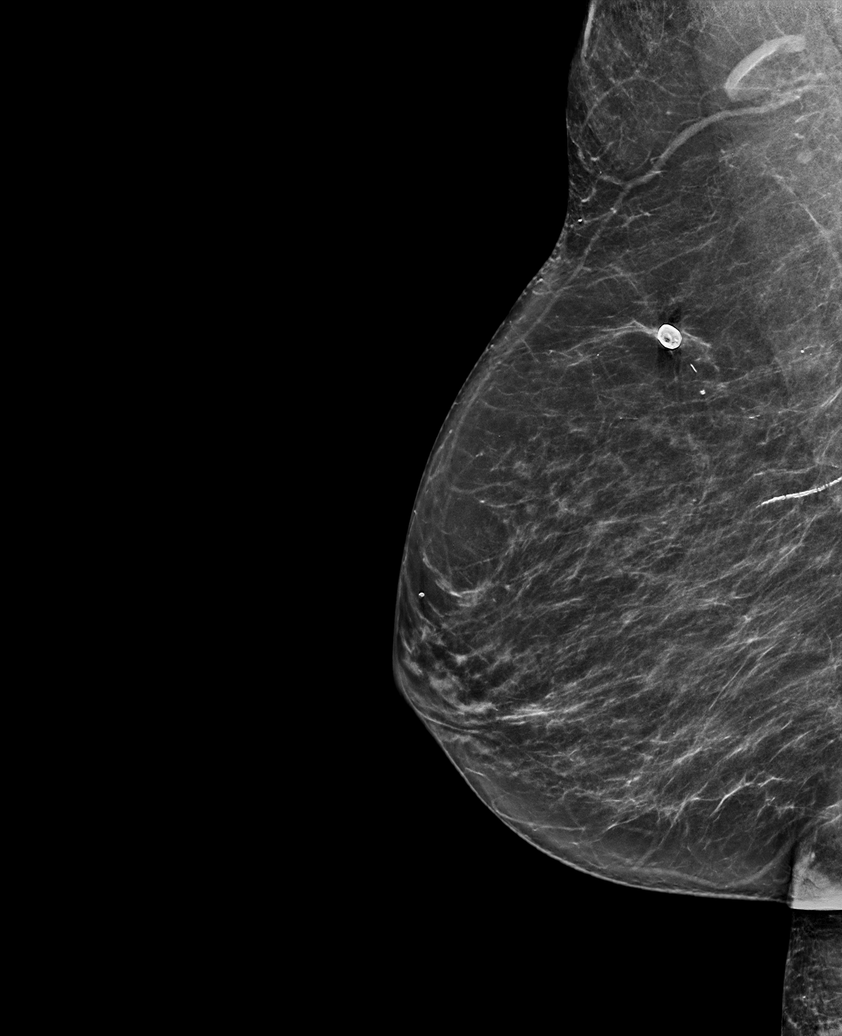

[R CC tomo · tomo slice 33/66.0]
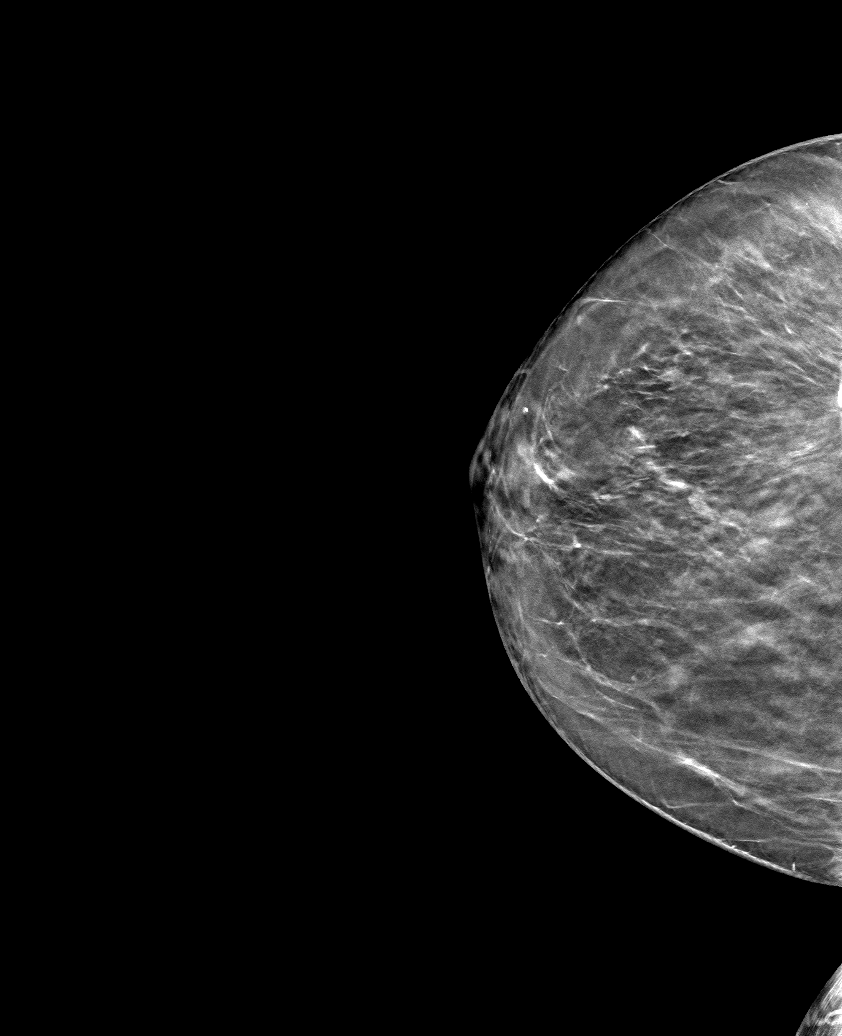

[6 of 30 positions shown; findings below may reference images not displayed]

ACR Breast Density Category b: There are scattered areas of
fibroglandular density.
FINDINGS: In the left breast, a possible asymmetry warrants further
evaluation. In the right breast, no findings suspicious for
malignancy.
IMPRESSION: Further evaluation is suggested for possible asymmetry in the left
breast.

RECOMMENDATION:
Diagnostic mammogram and possibly ultrasound of the left breast.
(Code:[id number])

The patient will be contacted regarding the findings, and additional
imaging will be scheduled.

BI-RADS CATEGORY  0: Incomplete. Need additional imaging evaluation
and/or prior mammograms for comparison.

## 2023-07-26 DIAGNOSIS — R300 Dysuria: Secondary | ICD-10-CM | POA: Diagnosis not present

## 2023-07-26 DIAGNOSIS — I5030 Unspecified diastolic (congestive) heart failure: Secondary | ICD-10-CM | POA: Diagnosis not present

## 2023-07-26 DIAGNOSIS — Z6829 Body mass index (BMI) 29.0-29.9, adult: Secondary | ICD-10-CM | POA: Diagnosis not present

## 2023-07-26 DIAGNOSIS — I130 Hypertensive heart and chronic kidney disease with heart failure and stage 1 through stage 4 chronic kidney disease, or unspecified chronic kidney disease: Secondary | ICD-10-CM | POA: Diagnosis not present

## 2023-07-26 DIAGNOSIS — N1831 Chronic kidney disease, stage 3a: Secondary | ICD-10-CM | POA: Diagnosis not present

## 2023-07-26 DIAGNOSIS — I4891 Unspecified atrial fibrillation: Secondary | ICD-10-CM | POA: Diagnosis not present

## 2023-07-26 DIAGNOSIS — F0390 Unspecified dementia without behavioral disturbance: Secondary | ICD-10-CM | POA: Diagnosis not present

## 2023-07-26 DIAGNOSIS — M545 Low back pain, unspecified: Secondary | ICD-10-CM | POA: Diagnosis not present

## 2023-08-01 DIAGNOSIS — E860 Dehydration: Secondary | ICD-10-CM | POA: Diagnosis not present

## 2023-08-07 DIAGNOSIS — Z09 Encounter for follow-up examination after completed treatment for conditions other than malignant neoplasm: Secondary | ICD-10-CM | POA: Diagnosis not present

## 2023-08-12 DIAGNOSIS — R42 Dizziness and giddiness: Secondary | ICD-10-CM | POA: Diagnosis not present

## 2023-08-23 DIAGNOSIS — N1831 Chronic kidney disease, stage 3a: Secondary | ICD-10-CM | POA: Diagnosis not present

## 2023-08-23 DIAGNOSIS — J069 Acute upper respiratory infection, unspecified: Secondary | ICD-10-CM | POA: Diagnosis not present

## 2023-08-23 DIAGNOSIS — I10 Essential (primary) hypertension: Secondary | ICD-10-CM | POA: Diagnosis not present

## 2023-09-05 DIAGNOSIS — M545 Low back pain, unspecified: Secondary | ICD-10-CM | POA: Diagnosis not present

## 2023-09-05 DIAGNOSIS — R8290 Unspecified abnormal findings in urine: Secondary | ICD-10-CM | POA: Diagnosis not present

## 2023-09-05 DIAGNOSIS — B3731 Acute candidiasis of vulva and vagina: Secondary | ICD-10-CM | POA: Diagnosis not present

## 2023-09-05 DIAGNOSIS — N390 Urinary tract infection, site not specified: Secondary | ICD-10-CM | POA: Diagnosis not present

## 2023-09-06 DIAGNOSIS — R8290 Unspecified abnormal findings in urine: Secondary | ICD-10-CM | POA: Diagnosis not present

## 2023-10-31 ENCOUNTER — Other Ambulatory Visit: Payer: Self-pay | Admitting: Family Medicine

## 2023-10-31 DIAGNOSIS — Z1231 Encounter for screening mammogram for malignant neoplasm of breast: Secondary | ICD-10-CM

## 2023-11-19 DIAGNOSIS — E7800 Pure hypercholesterolemia, unspecified: Secondary | ICD-10-CM | POA: Diagnosis not present

## 2023-11-19 DIAGNOSIS — Z79899 Other long term (current) drug therapy: Secondary | ICD-10-CM | POA: Diagnosis not present

## 2023-11-19 DIAGNOSIS — N1831 Chronic kidney disease, stage 3a: Secondary | ICD-10-CM | POA: Diagnosis not present

## 2023-11-26 ENCOUNTER — Ambulatory Visit
Admission: RE | Admit: 2023-11-26 | Discharge: 2023-11-26 | Disposition: A | Discharge status: Home / Self Care | Payer: PPO | Source: Ambulatory Visit | Attending: Family Medicine | Admitting: Family Medicine

## 2023-11-26 DIAGNOSIS — N1831 Chronic kidney disease, stage 3a: Secondary | ICD-10-CM | POA: Diagnosis not present

## 2023-11-26 DIAGNOSIS — I700 Atherosclerosis of aorta: Secondary | ICD-10-CM | POA: Diagnosis not present

## 2023-11-26 DIAGNOSIS — R29898 Other symptoms and signs involving the musculoskeletal system: Secondary | ICD-10-CM | POA: Diagnosis not present

## 2023-11-26 DIAGNOSIS — I5032 Chronic diastolic (congestive) heart failure: Secondary | ICD-10-CM | POA: Diagnosis not present

## 2023-11-26 DIAGNOSIS — R7303 Prediabetes: Secondary | ICD-10-CM | POA: Diagnosis not present

## 2023-11-26 DIAGNOSIS — E7800 Pure hypercholesterolemia, unspecified: Secondary | ICD-10-CM | POA: Diagnosis not present

## 2023-11-26 DIAGNOSIS — Z1231 Encounter for screening mammogram for malignant neoplasm of breast: Secondary | ICD-10-CM | POA: Diagnosis not present

## 2023-11-26 DIAGNOSIS — I10 Essential (primary) hypertension: Secondary | ICD-10-CM | POA: Diagnosis not present

## 2023-11-26 DIAGNOSIS — F0390 Unspecified dementia without behavioral disturbance: Secondary | ICD-10-CM | POA: Diagnosis not present

## 2023-11-26 DIAGNOSIS — Z1331 Encounter for screening for depression: Secondary | ICD-10-CM | POA: Diagnosis not present

## 2023-11-26 DIAGNOSIS — I480 Paroxysmal atrial fibrillation: Secondary | ICD-10-CM | POA: Diagnosis not present

## 2023-11-26 DIAGNOSIS — Z79899 Other long term (current) drug therapy: Secondary | ICD-10-CM | POA: Diagnosis not present

## 2023-11-26 DIAGNOSIS — Z0000 Encounter for general adult medical examination without abnormal findings: Secondary | ICD-10-CM | POA: Diagnosis not present

## 2023-12-23 DIAGNOSIS — R8290 Unspecified abnormal findings in urine: Secondary | ICD-10-CM | POA: Diagnosis not present

## 2023-12-23 DIAGNOSIS — N390 Urinary tract infection, site not specified: Secondary | ICD-10-CM | POA: Diagnosis not present

## 2023-12-23 DIAGNOSIS — R300 Dysuria: Secondary | ICD-10-CM | POA: Diagnosis not present

## 2024-01-13 DIAGNOSIS — E7800 Pure hypercholesterolemia, unspecified: Secondary | ICD-10-CM | POA: Diagnosis not present

## 2024-01-13 DIAGNOSIS — U071 COVID-19: Secondary | ICD-10-CM | POA: Diagnosis not present

## 2024-03-17 DIAGNOSIS — N390 Urinary tract infection, site not specified: Secondary | ICD-10-CM | POA: Diagnosis not present

## 2024-03-17 DIAGNOSIS — R1032 Left lower quadrant pain: Secondary | ICD-10-CM | POA: Diagnosis not present

## 2024-03-17 DIAGNOSIS — K5732 Diverticulitis of large intestine without perforation or abscess without bleeding: Secondary | ICD-10-CM | POA: Diagnosis not present

## 2024-05-25 DIAGNOSIS — R7303 Prediabetes: Secondary | ICD-10-CM | POA: Diagnosis not present

## 2024-05-25 DIAGNOSIS — N1831 Chronic kidney disease, stage 3a: Secondary | ICD-10-CM | POA: Diagnosis not present

## 2024-06-01 DIAGNOSIS — F0390 Unspecified dementia without behavioral disturbance: Secondary | ICD-10-CM | POA: Diagnosis not present

## 2024-06-01 DIAGNOSIS — I700 Atherosclerosis of aorta: Secondary | ICD-10-CM | POA: Diagnosis not present

## 2024-06-01 DIAGNOSIS — I5032 Chronic diastolic (congestive) heart failure: Secondary | ICD-10-CM | POA: Diagnosis not present

## 2024-06-01 DIAGNOSIS — N1831 Chronic kidney disease, stage 3a: Secondary | ICD-10-CM | POA: Diagnosis not present

## 2024-06-01 DIAGNOSIS — I480 Paroxysmal atrial fibrillation: Secondary | ICD-10-CM | POA: Diagnosis not present

## 2024-06-01 DIAGNOSIS — Z79899 Other long term (current) drug therapy: Secondary | ICD-10-CM | POA: Diagnosis not present

## 2024-06-01 DIAGNOSIS — I10 Essential (primary) hypertension: Secondary | ICD-10-CM | POA: Diagnosis not present

## 2024-06-01 DIAGNOSIS — R7303 Prediabetes: Secondary | ICD-10-CM | POA: Diagnosis not present

## 2024-06-01 DIAGNOSIS — E7800 Pure hypercholesterolemia, unspecified: Secondary | ICD-10-CM | POA: Diagnosis not present

## 2024-07-23 NOTE — Progress Notes (Unsigned)
 Cardiology Office Note  Date:  07/23/2024   ID:  Kayla Gray, DOB [birth year], MRN [id number]  PCP:  Diedra Lame, MD   No chief complaint on file.   HPI:  Kayla Gray is a very pleasant 88 year old woman with history of  severe aortic valve stenosis, status post AVR with bioprosthetic valve 09/16/2015 postoperative atrial fibrillation, started on amiodarone , warfarin syncope (stumbled backwards, fell back hit head, 2013),  hyperlipidemia,  iron deficiency anemia, peptic ulcer disease Status post left total knee replacement 11/15/2011 SVT in February 2013 admitted  elevated troponin felt secondary to demand ischemia from tachycardia.  February 2015 with TIA-type symptoms, 30 day monitor did not show significant arrhythmia Cardiac cath 08/2015: no CAD Alzheimer's dementia EF 60 to 65% March 2023 fall with head injury, status post right craniotomy and evacuation She presents today for  Syncope, SVT and bioprosthetic aortic valve  Last seen in clinic by myself in August 2024  Lives with husband age 80  Sedentary at baseline, no regular walking or exercise program Reports that she gave up her driver's license but husband still drives  Does not like to use her cane or walker Denies recent falls  echocardiogram March 2023, stable bioprosthetic aortic valve EF 60 to 65%  In the hospital May 01, 2022 with dizziness, diagnosed with urinary tract infection   admitted to [facility] on 12/18/2021 after a fall while mopping the floor with subsequent injury to the back of the head and syncope.  She was found to have 1.3 cm right SDH with leftward shift and was taken to the OR for right craniotomy and evacuation  by Dr. Katrina   05/01/22: in the ER diarrhea UTI, given abx Stopped aricept out of concern for side effects  EKG personally reviewed by myself on todays visit     Zio monitor Normal sinus rhythm Patient had a min HR of 52 bpm, max HR of 211 bpm, and avg HR of 77  bpm. Bundle Branch Block/IVCD was present.    2 Ventricular Tachycardia runs occurred, the run with the fastest interval lasting 4 beats with a max rate of 176 bpm, the longest lasting 5 beats with an avg rate of 139 bpm.    442 Supraventricular Tachycardia/atrial tachycardia runs occurred, the run with the fastest interval lasting 2 mins 1 sec with a max rate of 211 bpm (avg 144 bpm); the run with the fastest  interval was also the longest.   Isolated SVEs were rare (<1.0%), SVE Couplets were rare (<1.0%), and SVE Triplets were rare (<1.0%). Isolated VEs were rare (<1.0%), VE Couplets were rare (<1.0%), and no VE Triplets were present.   Other past medical history reviewed  October 20th, 2017  syncopal episode , out having supper at [employer]. Went to bathroom. Had abdominal pain, urgency, felt that she had to have a bowel movement.   lightheadedness, everything went white prior to having her bowel movement. Found herself on the floor, had released her bowels  emergency department at [facility]   brain CT scan was negative. Seen by neurology, had EEG performed   MRI  12/7    postoperative atrial fibrillation, started on amiodarone , warfarin not had any problems with her anticoagulation  echocardiogram 01/ 8 2014 showed normal LV function, aortic valve velocity 315 cm/s, peak gradient 56 mm of mercury, mean gradient 19 mm of mercury, estimated aortic valve area 0.61 cm Echocardiogram in late 2014 showing velocity of 400     PMH:   has  a past medical history of Aortic stenosis, Bilateral cataracts, Bleeding nose, Breast cancer (HCC) (1999), Carotid arterial disease, Chronic diastolic (congestive) heart failure (HCC), Depression, Diverticulosis, Gastroesophageal reflux disease, Heart murmur, History of cardiac catheterization, Hyperlipidemia, Hypertension, Iron deficiency anemia, Obstructive sleep apnea, Osteoarthritis, Osteopenia, Peptic ulcer disease, Personal history of radiation therapy,  PSVT (paroxysmal supraventricular tachycardia), S/P aortic valve replacement with bioprosthetic valve (09/16/2015), Skin cancer (2020), Stroke [facility]), Subdural hematoma (HCC), Supraventricular tachycardia, Syncope and collapse, and Urinary tract infection.  PSH:    Past Surgical History:  Procedure Laterality Date   AORTIC VALVE REPLACEMENT N/A 09/16/2015   Procedure: AORTIC VALVE REPLACEMENT (AVR);  Surgeon: Sudie VEAR Laine, MD;  Location: [facility] OR;  Service: Open Heart Surgery;  Laterality: N/A;   BREAST BIOPSY Right 1999   breast ca radation   BREAST EXCISIONAL BIOPSY Left yrs ago    benign   BREAST LUMPECTOMY Right 1999   with radiation   CARDIAC CATHETERIZATION N/A 08/17/2015   Procedure: Left Heart Cath and Coronary Angiography;  Surgeon: Evalene JINNY Lunger, MD;  Location: [facility] INVASIVE CV LAB;  Service: Cardiovascular;  Laterality: N/A;   CATARACT EXTRACTION, BILATERAL     CHOLECYSTECTOMY     CRANIOTOMY Right 12/18/2021   Procedure: CRANIOTOMY HEMATOMA EVACUATION SUBDURAL;  Surgeon: Clois Fret, MD;  Location: [facility] ORS;  Service: Neurosurgery;  Laterality: Right;   JOINT REPLACEMENT Bilateral    knee replacement   KNEE ARTHROSCOPY     right   KNEE ARTHROSCOPY     left   SKIN CANCER EXCISION  2020   TEE WITHOUT CARDIOVERSION N/A 09/16/2015   Procedure: TRANSESOPHAGEAL ECHOCARDIOGRAM (TEE);  Surgeon: Sudie VEAR Laine, MD;  Location: [facility] OR;  Service: Open Heart Surgery;  Laterality: N/A;   TONSILLECTOMY     VAGINAL HYSTERECTOMY      Current Outpatient Medications  Medication Sig Dispense Refill   acetaminophen  (TYLENOL ) 325 MG tablet Take 650 mg by mouth every 6 (six) hours as needed.     aspirin  EC 81 MG tablet Take 1 tablet (81 mg total) by mouth daily. Swallow whole. 90 tablet 3   Calcium  Carbonate-Vitamin D  (CALCIUM  600 + D PO) Take by mouth 2 (two) times daily.     ezetimibe  (ZETIA ) 10 MG tablet Take 10 mg by mouth daily.     fluticasone  (FLONASE ) 50 MCG/ACT nasal spray  Place 2 sprays into the nose daily as needed.     ketoconazole (NIZORAL) 2 % shampoo Apply topically 3 (three) times a week.     metoprolol  tartrate (LOPRESSOR ) 25 MG tablet Take 1 tablet (25 mg total) by mouth 2 (two) times daily. 180 tablet 3   Multiple Vitamin (MULTIVITAMIN) tablet Take 1 tablet by mouth daily.     PARoxetine  (PAXIL ) 20 MG tablet Take 20 mg by mouth daily.     senna (SENOKOT) 8.6 MG TABS tablet Take 1 tablet (8.6 mg total) by mouth 2 (two) times daily. 120 tablet 0   No current facility-administered medications for this visit.    Allergies:   Evista [raloxifene], Sulfa antibiotics, Codeine, Nitrofurantoin, Oxycodone -acetaminophen , and Statins   Social History:  The patient  reports that she has never smoked. She has never used smokeless tobacco. She reports that she does not currently use alcohol. She reports that she does not use drugs.   Family History:   family history includes Breast cancer in her maternal aunt; Breast cancer (age of onset: 75) in her daughter; Heart failure in her mother.    Review  of Systems: Review of Systems  Constitutional: Negative.   HENT: Negative.    Respiratory: Negative.    Cardiovascular: Negative.   Gastrointestinal: Negative.   Musculoskeletal: Negative.   Neurological: Negative.   Psychiatric/Behavioral: Negative.    All other systems reviewed and are negative.   PHYSICAL EXAM: VS:  There were no vitals taken for this visit. , BMI There is no height or weight on file to calculate BMI. Constitutional:  oriented to person, place, and time. No distress.  HENT:  Head: Grossly normal Eyes:  no discharge. No scleral icterus.  Neck: No JVD, no carotid bruits  Cardiovascular: Regular rate and rhythm, no murmurs appreciated Pulmonary/Chest: Clear to auscultation bilaterally, no wheezes or rails Abdominal: Soft.  no distension.  no tenderness.  Musculoskeletal: Normal range of motion Neurological:  normal muscle tone. Coordination  normal. No atrophy Skin: Skin warm and dry Psychiatric: normal affect, pleasant   Recent Labs: No results found for requested labs within last 365 days.    Lipid Panel Lab Results  Component Value Date   CHOL 161 12/19/2021   HDL 50 12/19/2021   LDLCALC 93 12/19/2021   TRIG 89 12/19/2021    Wt Readings from Last 3 Encounters:  06/11/23 162 lb 8 oz (73.7 kg)  12/09/22 160 lb (72.6 kg)  06/04/22 162 lb 4 oz (73.6 kg)     ASSESSMENT AND PLAN:  SVT (supraventricular tachycardia) (HCC)  Prior history of SVT.  Denies paroxysmal tachycardia Tolerating metoprolol  tartrate 25 twice daily  Paroxysmal atrial fibrillation (HCC) - Plan: EKG 12-Lead Off warfarin given prior notes of syncope, prior fall and subdural hematoma  Denies atrial fibrillation symptoms, on metoprolol   Syncope, unspecified syncope type -  Remote vasovagal event in the setting of abdominal pain, bowel urgency Prior syncope with defecation No recent episodes of near syncope or syncope  Severe aortic valve stenosis S/P aortic valve replacement with bioprosthetic valve stable valve on echocardiogram 2023 Home  Total encounter time more than 30 minutes  Greater than 50% was spent in counseling and coordination of care with the patient   No orders of the defined types were placed in this encounter.    Signed, Velinda Lunger, M.D., Ph.D. 07/23/2024  [facility] [place], [place] [phone]

## 2024-07-24 ENCOUNTER — Ambulatory Visit: Attending: Cardiovascular Disease | Admitting: Cardiovascular Disease

## 2024-07-24 ENCOUNTER — Encounter: Payer: Self-pay | Admitting: Cardiovascular Disease

## 2024-07-24 VITALS — BP 140/78 | HR 65 | Ht 62.0 in | Wt 162.2 lb

## 2024-07-24 DIAGNOSIS — I471 Supraventricular tachycardia, unspecified: Secondary | ICD-10-CM

## 2024-07-24 DIAGNOSIS — S065XAS Traumatic subdural hemorrhage with loss of consciousness status unknown, sequela: Secondary | ICD-10-CM

## 2024-07-24 DIAGNOSIS — I5032 Chronic diastolic (congestive) heart failure: Secondary | ICD-10-CM | POA: Diagnosis not present

## 2024-07-24 DIAGNOSIS — Z953 Presence of xenogenic heart valve: Secondary | ICD-10-CM

## 2024-07-24 DIAGNOSIS — I10 Essential (primary) hypertension: Secondary | ICD-10-CM | POA: Diagnosis not present

## 2024-07-24 DIAGNOSIS — I480 Paroxysmal atrial fibrillation: Secondary | ICD-10-CM | POA: Diagnosis not present

## 2024-07-24 DIAGNOSIS — R55 Syncope and collapse: Secondary | ICD-10-CM | POA: Diagnosis not present

## 2024-07-24 DIAGNOSIS — I060 Rheumatic aortic stenosis: Secondary | ICD-10-CM

## 2024-07-24 DIAGNOSIS — S065XAA Traumatic subdural hemorrhage with loss of consciousness status unknown, initial encounter: Secondary | ICD-10-CM

## 2024-07-24 MED ORDER — METOPROLOL TARTRATE 25 MG PO TABS: 25.0000 mg | ORAL_TABLET | Freq: Two times a day (BID) | ORAL | 3 refills | Status: AC

## 2024-07-24 NOTE — Patient Instructions (Addendum)
 Medication Instructions:  No changes  If you need a refill on your cardiac medications before your next appointment, please call your pharmacy.   Lab work: No new labs needed  Testing/Procedures: Your physician has requested that you have an echocardiogram. Echocardiography is a painless test that uses sound waves to create images of your heart. It provides your doctor with information about the size and shape of your heart and how well your heart's chambers and valves are working.   You may receive an ultrasound enhancing agent through an IV if needed to better visualize your heart during the echo. This procedure takes approximately one hour.  There are no restrictions for this procedure.  This will take place at [address] [facility] [address]) [address], [place] [address]  Please note: We ask at that you not bring children with you during ultrasound (echo/ vascular) testing. Due to room size and safety concerns, children are not allowed in the ultrasound rooms during exams. Our front office staff cannot provide observation of children in our lobby area while testing is being conducted. An adult accompanying a patient to their appointment will only be allowed in the ultrasound room at the discretion of the ultrasound technician under special circumstances. We apologize for any inconvenience.   Follow-Up: At [facility], you and your health needs are our priority.  As part of our continuing mission to provide you with exceptional heart care, we have created designated Provider Care Teams.  These Care Teams include your primary Cardiologist (physician) and Advanced Practice Providers (APPs -  Physician Assistants and Nurse Practitioners) who all work together to provide you with the care you need, when you need it.  You will need a follow up appointment in 12 months  Providers on your designated Care Team:   Nicolasa Ducking, NP Eula Listen, PA-C Cadence Fransico [place], [place]  COVID-19  Vaccine Information can be found at: [email or web address] For questions related to vaccine distribution or appointments, please email [email or web address] or call [phone].

## 2024-08-24 ENCOUNTER — Other Ambulatory Visit: Payer: Self-pay | Admitting: Family Medicine

## 2024-08-24 DIAGNOSIS — Z1231 Encounter for screening mammogram for malignant neoplasm of breast: Secondary | ICD-10-CM

## 2024-10-10 ENCOUNTER — Ambulatory Visit
Admission: RE | Admit: 2024-10-10 | Discharge: 2024-10-10 | Disposition: A | Discharge status: Home / Self Care | Payer: Self-pay | Source: Ambulatory Visit | Attending: Emergency Medicine

## 2024-10-10 ENCOUNTER — Emergency Department

## 2024-10-10 ENCOUNTER — Other Ambulatory Visit: Payer: Self-pay

## 2024-10-10 ENCOUNTER — Emergency Department
Admission: EM | Admit: 2024-10-10 | Discharge: 2024-10-10 | Disposition: A | Discharge status: Home / Self Care | Attending: Emergency Medicine | Admitting: Emergency Medicine

## 2024-10-10 VITALS — BP 133/90 | HR 134 | Temp 97.9°F | Resp 16

## 2024-10-10 DIAGNOSIS — R000 Tachycardia, unspecified: Secondary | ICD-10-CM | POA: Insufficient documentation

## 2024-10-10 DIAGNOSIS — J069 Acute upper respiratory infection, unspecified: Secondary | ICD-10-CM | POA: Diagnosis not present

## 2024-10-10 DIAGNOSIS — I4891 Unspecified atrial fibrillation: Secondary | ICD-10-CM | POA: Diagnosis not present

## 2024-10-10 DIAGNOSIS — R059 Cough, unspecified: Secondary | ICD-10-CM | POA: Diagnosis present

## 2024-10-10 DIAGNOSIS — I480 Paroxysmal atrial fibrillation: Secondary | ICD-10-CM | POA: Diagnosis present

## 2024-10-10 LAB — CBC
HCT: 38.5 % (ref 36.0–46.0)
Hemoglobin: 12.2 g/dL (ref 12.0–15.0)
MCH: 27.9 pg (ref 26.0–34.0)
MCHC: 31.7 g/dL (ref 30.0–36.0)
MCV: 87.9 fL (ref 80.0–100.0)
Platelets: 159 K/uL (ref 150–400)
RBC: 4.38 MIL/uL (ref 3.87–5.11)
RDW: 14.1 % (ref 11.5–15.5)
WBC: 6.1 K/uL (ref 4.0–10.5)
nRBC: 0 % (ref 0.0–0.2)

## 2024-10-10 LAB — RESP PANEL BY RT-PCR (RSV, FLU A&B, COVID)  RVPGX2
Influenza A by PCR: NEGATIVE
Influenza B by PCR: NEGATIVE
Resp Syncytial Virus by PCR: NEGATIVE
SARS Coronavirus 2 by RT PCR: NEGATIVE

## 2024-10-10 LAB — BASIC METABOLIC PANEL WITH GFR
Anion gap: 12 (ref 5–15)
BUN: 14 mg/dL (ref 8–23)
CO2: 23 mmol/L (ref 22–32)
Calcium: 9.1 mg/dL (ref 8.9–10.3)
Chloride: 104 mmol/L (ref 98–111)
Creatinine, Ser: 1.03 mg/dL — ABNORMAL HIGH (ref 0.44–1.00)
GFR, Estimated: 53 mL/min — ABNORMAL LOW
Glucose, Bld: 92 mg/dL (ref 70–99)
Potassium: 3.8 mmol/L (ref 3.5–5.1)
Sodium: 139 mmol/L (ref 135–145)

## 2024-10-10 LAB — TROPONIN T, HIGH SENSITIVITY: Troponin T High Sensitivity: 23 ng/L — ABNORMAL HIGH (ref 0–19)

## 2024-10-10 LAB — MAGNESIUM: Magnesium: 2.1 mg/dL (ref 1.7–2.4)

## 2024-10-10 MED ORDER — HYDROCOD POLI-CHLORPHE POLI ER 10-8 MG/5ML PO SUER: 5.0000 mL | Freq: Two times a day (BID) | ORAL | 0 refills | Status: AC | PRN

## 2024-10-10 MED ORDER — ACETAMINOPHEN 500 MG PO TABS
1000.0000 mg | ORAL_TABLET | Freq: Once | ORAL | Status: AC
  Administered 2024-10-10: 1000 mg via ORAL
  Filled 2024-10-10: qty 2

## 2024-10-10 MED ORDER — METOPROLOL TARTRATE 25 MG PO TABS
25.0000 mg | ORAL_TABLET | Freq: Once | ORAL | Status: AC
  Administered 2024-10-10: 25 mg via ORAL
  Filled 2024-10-10: qty 1

## 2024-10-10 MED ORDER — HYDROCOD POLI-CHLORPHE POLI ER 10-8 MG/5ML PO SUER
5.0000 mL | Freq: Once | ORAL | Status: AC
  Administered 2024-10-10: 5 mL via ORAL
  Filled 2024-10-10: qty 5

## 2024-10-10 NOTE — ED Provider Notes (Signed)
 " CAY RALPH PELT    CSN: [id number] Arrival date & time: 10/10/24  1137      History   Chief Complaint Chief Complaint  Patient presents with   Cough    Entered by patient    HPI Kayla Gray is a 88 y.o. female.  Accompanied by her daughter, patient presents with congestion and cough since 10/08/2024.  No fever, chest pain, shortness of breath, focal weakness.  She has been treating her cough with NyQuil.  Patient also reports stye on both lower eyelids x 2 weeks.  No eye drainage.  Patient's medical history includes paroxysmal atrial fibrillation, supraventricular tachycardia, chronic heart failure, angina, hypertension, subdural hematoma.  The history is provided by the patient, a relative and medical records.    Past Medical History:  Diagnosis Date   Aortic stenosis    Bilateral cataracts    Bleeding nose    Thurs night (09/08/15) and Fri (09/09/15) left side.Bleeding didn't last long   Breast cancer (HCC) 1999   right breast lumpectomy and rad tx   Carotid arterial disease    a. 12/2018 Carotid U/S: RICA <50%, LICA 50-69%.   Chronic diastolic (congestive) heart failure (HCC)    Depression    Diverticulosis    Gastroesophageal reflux disease    Heart murmur    History of cardiac catheterization    a. 08/2015 Cath: nl cors. Sev AS ([employer] ).   Hyperlipidemia    Hypertension    Iron deficiency anemia    Obstructive sleep apnea    Osteoarthritis    Osteopenia    Peptic ulcer disease    Personal history of radiation therapy    PSVT (paroxysmal supraventricular tachycardia)    S/P aortic valve replacement with bioprosthetic valve 09/16/2015   a. 09/2015 s/p 23 mm Edwards Intuity Elite bioprosthetic tissue valve; b. 12/2021 Echo: EF 60-65%, no rwma, GrI DD, nl RV fxn. Mild MR. Nl fxn'ing AoV prosthesis.   Skin cancer 2020   Stroke [facility])    11/14/13   Subdural hematoma (HCC)    a. 12/2021 fall-->R subdural hematoma w/ midlilne shift-->s/p  craniotomy.   Supraventricular tachycardia    Syncope and collapse    Urinary tract infection     Patient Active Problem List   Diagnosis Date Noted   Dysphagia 01/02/2022   ABLA (acute blood loss anemia) 01/02/2022   SDH (subdural hematoma) (HCC) 12/25/2021   Essential hypertension 12/24/2021   Subdural hematoma (HCC) 12/18/2021   Diverticulitis 05/27/2019   Unsteady gait 12/27/2018   PAF (paroxysmal atrial fibrillation) (HCC) 11/14/2015   Encounter for therapeutic drug monitoring 09/29/2015   Chronic diastolic (congestive) heart failure (HCC)    SOB (shortness of breath) 08/17/2015   Angina pectoris 08/17/2015   Low back pain 08/12/2015   Sinus tachycardia 01/06/2014   Osteoarthritis of both knees 01/06/2014   Aortic valve stenosis 11/27/2012   SVT (supraventricular tachycardia) 11/27/2012   Syncope 11/27/2012    Past Surgical History:  Procedure Laterality Date   AORTIC VALVE REPLACEMENT N/A 09/16/2015   Procedure: AORTIC VALVE REPLACEMENT (AVR);  Surgeon: Sudie VEAR Laine, MD;  Location: [facility] OR;  Service: Open Heart Surgery;  Laterality: N/A;   BREAST BIOPSY Right 1999   breast ca radation   BREAST EXCISIONAL BIOPSY Left yrs ago    benign   BREAST LUMPECTOMY Right 1999   with radiation   CARDIAC CATHETERIZATION N/A 08/17/2015   Procedure: Left Heart Cath and Coronary Angiography;  Surgeon: Evalene JINNY Lunger,  MD;  Location: [facility] INVASIVE CV LAB;  Service: Cardiovascular;  Laterality: N/A;   CATARACT EXTRACTION, BILATERAL     CHOLECYSTECTOMY     CRANIOTOMY Right 12/18/2021   Procedure: CRANIOTOMY HEMATOMA EVACUATION SUBDURAL;  Surgeon: Clois Fret, MD;  Location: [facility] ORS;  Service: Neurosurgery;  Laterality: Right;   JOINT REPLACEMENT Bilateral    knee replacement   KNEE ARTHROSCOPY     right   KNEE ARTHROSCOPY     left   SKIN CANCER EXCISION  2020   TEE WITHOUT CARDIOVERSION N/A 09/16/2015   Procedure: TRANSESOPHAGEAL ECHOCARDIOGRAM (TEE);  Surgeon: Sudie VEAR Laine, MD;  Location: [facility] OR;  Service: Open Heart Surgery;  Laterality: N/A;   TONSILLECTOMY     VAGINAL HYSTERECTOMY      OB History   No obstetric history on file.      Home Medications    Prior to Admission medications  Medication Sig Start Date End Date Taking? Authorizing Provider  acetaminophen  (TYLENOL ) 325 MG tablet Take 650 mg by mouth every 6 (six) hours as needed.    [provider]  aspirin  EC 81 MG tablet Take 1 tablet (81 mg total) by mouth daily. Swallow whole. 06/04/22   Gollan, Timothy J, MD  Calcium  Carbonate-Vitamin D  (CALCIUM  600 + D PO) Take by mouth 2 (two) times daily.    [provider]  ezetimibe  (ZETIA ) 10 MG tablet Take 10 mg by mouth daily.    [provider]  fluticasone  (FLONASE ) 50 MCG/ACT nasal spray Place 2 sprays into the nose daily as needed.    [provider]  ketoconazole (NIZORAL) 2 % shampoo Apply topically 3 (three) times a week. 09/20/21   [provider]  metoprolol  tartrate (LOPRESSOR ) 25 MG tablet Take 1 tablet (25 mg total) by mouth 2 (two) times daily. 07/24/24   Gollan, Timothy J, MD  Multiple Vitamin (MULTIVITAMIN) tablet Take 1 tablet by mouth daily.    [provider]  PARoxetine  (PAXIL ) 20 MG tablet Take 20 mg by mouth daily.    [provider]  senna (SENOKOT) 8.6 MG TABS tablet Take 1 tablet (8.6 mg total) by mouth 2 (two) times daily. 01/05/22   LoveSharlet RAMAN, PA-C    Family History Family History  Problem Relation Age of Onset   Heart failure Mother    Breast cancer Daughter 14   Breast cancer Maternal Aunt     Social History Social History[1]   Allergies   Evista [raloxifene], Sulfa antibiotics, Codeine, Nitrofurantoin, Oxycodone -acetaminophen , and Statins   Review of Systems Review of Systems  Constitutional:  Negative for chills and fever.  HENT:  Positive for congestion. Negative for sore throat.   Eyes:  Positive for redness. Negative for discharge.   Respiratory:  Positive for cough. Negative for shortness of breath.   Cardiovascular:  Negative for chest pain and palpitations.  Neurological:  Negative for syncope, weakness and numbness.     Physical Exam Triage Vital Signs ED Triage Vitals  Encounter Vitals Group     BP 10/10/24 1158 (!) 160/97     Girls Systolic BP Percentile --      Girls Diastolic BP Percentile --      Boys Systolic BP Percentile --      Boys Diastolic BP Percentile --      Pulse Rate 10/10/24 1158 (!) 134     Resp 10/10/24 1158 16     Temp 10/10/24 1158 97.9 F (36.6 C)     Temp  Source 10/10/24 1158 Oral     SpO2 10/10/24 1158 95 %     Weight --      Height --      Head Circumference --      Peak Flow --      Pain Score 10/10/24 1156 5     Pain Loc --      Pain Education --      Exclude from Growth Chart --    No data found.  Updated Vital Signs BP (!) 133/90 (BP Location: Right Arm)   Pulse (S) (!) 134 Comment: provider Corlis, NP notified  Temp 97.9 F (36.6 C) (Oral)   Resp 16   SpO2 95%   Visual Acuity Right Eye Distance:   Left Eye Distance:   Bilateral Distance:    Right Eye Near:   Left Eye Near:    Bilateral Near:     Physical Exam Constitutional:      General: She is not in acute distress. HENT:     Mouth/Throat:     Mouth: Mucous membranes are moist.  Cardiovascular:     Rate and Rhythm: Tachycardia present. Rhythm irregular.     Heart sounds: Normal heart sounds.  Pulmonary:     Effort: Pulmonary effort is normal. No respiratory distress.     Breath sounds: Normal breath sounds.  Neurological:     Mental Status: She is alert.     Sensory: No sensory deficit.     Motor: No weakness.     Gait: Gait normal.     Comments: Ambulatory with a rolling walker.      UC Treatments / Results  Labs (all labs ordered are listed, but only abnormal results are displayed) Labs Reviewed - No data to display  EKG   Radiology No results found.  Procedures Procedures  (including critical care time)  Medications Ordered in UC Medications - No data to display  Initial Impression / Assessment and Plan / UC Course  I have reviewed the triage vital signs and the nursing notes.  Pertinent labs & imaging results that were available during my care of the patient were reviewed by me and considered in my medical decision making (see chart for details).   Tachycardia, paroxysmal atrial fibrillation with RVR.  Heart rate 134 at rest.  EKG shows tachycardia, rate 129, no ST elevation, compared to previous from 07/24/2024.  The rhythm appears to be paroxysmal atrial fibrillation with RVR.  Patient has history of paroxysmal atrial fibrillation, supraventricular tachycardia, chronic heart failure.  Sending her to the ED for evaluation.  She is accompanied by her daughter who will drive her to [facility] ED.  They decline EMS.  Final Clinical Impressions(s) / UC Diagnoses   Final diagnoses:  Tachycardia  Paroxysmal atrial fibrillation with RVR [facility])     Discharge Instructions      Go directly to the emergency department for your high heart rate heart rate with history of atrial fibrillation.     ED Prescriptions   None    PDMP not reviewed this encounter.    [1]  Social History Tobacco Use   Smoking status: Never   Smokeless tobacco: Never  Vaping Use   Vaping status: Never Used  Substance Use Topics   Alcohol use: Not Currently   Drug use: No     Corlis Burnard DEL, NP 10/10/24 1222  "

## 2024-10-10 NOTE — ED Notes (Addendum)
 Pt coming from urgent care with c/o tachycardia. Pt's support person reports pt has dementia and take's metoprolol  and is unsure if compliant with medication. Pt denies pain at this time. Pt reports cough that started on 10/05/24. Pt A&Ox4.

## 2024-10-10 NOTE — ED Provider Notes (Signed)
 "  [facility] Provider Note    Event Date/Time   First MD Initiated Contact with Patient 10/10/24 1250     (approximate)   History   Tachycardia   HPI  Kayla Gray is a 88 y.o. female who presents to the ED for evaluation of Tachycardia   I reviewed cardiology clinic visit from October.  Bioprosthetic AVR 2016 with postoperative A-fib.  Previously on Coumadin  but this was discontinued after a traumatic subdural requiring drainage.  Patient presents to the ED from local urgent care for evaluation of tachycardia and rapid A-fib.  She presents with her daughter who provides supplemental history.  Patient has had a cough and congestion since Christmas, 2 days ago, and this was the reason they went to urgent care.  While there she was surprised at abnormal vital signs and this is why they referred her to us .  Here in the ED patient reports feeling fine she is just requesting something to help with her cough.  No chest discomfort, shortness of breath, palpitations, dizziness or syncope.   Physical Exam   Triage Vital Signs: ED Triage Vitals [10/10/24 1240]  Encounter Vitals Group     BP (!) 144/99     Girls Systolic BP Percentile      Girls Diastolic BP Percentile      Boys Systolic BP Percentile      Boys Diastolic BP Percentile      Pulse Rate (!) 133     Resp 18     Temp 98 F (36.7 C)     Temp Source Oral     SpO2 96 %     Weight      Height      Head Circumference      Peak Flow      Pain Score 0     Pain Loc      Pain Education      Exclude from Growth Chart     Most recent vital signs: Vitals:   10/10/24 1400 10/10/24 1430  BP: (!) 149/85 130/81  Pulse: 65 (!) 59  Resp: 15 10  Temp:    SpO2: 99% 100%    General: Awake, no distress.  Well-appearing CV:  Good peripheral perfusion.  Resp:  Normal effort.  Abd:  No distention.  MSK:  No deformity noted.  Neuro:  No focal deficits appreciated. Other:     ED Results /  Procedures / Treatments   Labs (all labs ordered are listed, but only abnormal results are displayed) Labs Reviewed  BASIC METABOLIC PANEL WITH GFR - Abnormal; Notable for the following components:      Result Value   Creatinine, Ser 1.03 (*)    GFR, Estimated 53 (*)    All other components within normal limits  TROPONIN T, HIGH SENSITIVITY - Abnormal; Notable for the following components:   Troponin T High Sensitivity 23 (*)    All other components within normal limits  RESP PANEL BY RT-PCR (RSV, FLU A&B, COVID)  RVPGX2  CBC  MAGNESIUM   TROPONIN T, HIGH SENSITIVITY    EKG A-fib with RVR with a rate of 129 bpm, from his baseline, no STEMI  RADIOLOGY CXR interpreted by me without evidence of acute cardiopulmonary pathology.  Official radiology report(s): DG Chest 2 View Result Date: 10/10/2024 CLINICAL DATA:  Tachycardia. EXAM: CHEST - 2 VIEW COMPARISON:  12/19/2021 FINDINGS: Cardiomediastinal silhouette and pulmonary vasculature are within normal limits. Lungs are clear. Postsurgical changes of valve  replacement again seen. IMPRESSION: No acute cardiopulmonary process. Electronically Signed   By: Aliene Lloyd M.D.   On: 10/10/2024 14:05    PROCEDURES and INTERVENTIONS:  .1-3 Lead EKG Interpretation  Performed by: Claudene Rover, MD Authorized by: Claudene Rover, MD     Interpretation: normal     ECG rate:  80   ECG rate assessment: normal     Rhythm: sinus rhythm     Ectopy: none     Conduction: normal     Medications  chlorpheniramine-HYDROcodone  (TUSSIONEX) 10-8 MG/5ML suspension 5 mL (5 mLs Oral Given 10/10/24 1327)  metoprolol  tartrate (LOPRESSOR ) tablet 25 mg (25 mg Oral Given 10/10/24 1326)  acetaminophen  (TYLENOL ) tablet 1,000 mg (1,000 mg Oral Given 10/10/24 1327)     IMPRESSION / MDM / ASSESSMENT AND PLAN / ED COURSE  I reviewed the triage vital signs and the nursing notes.  Differential diagnosis includes, but is not limited to, ACS, PTX, PNA, muscle  strain/spasm, PE, dissection, anxiety, pleural effusion  {Patient presents with symptoms of an acute illness or injury that is potentially life-threatening.  Patient presents with signs of a viral URI over the past couple days.  While at local urgent care she was noted to be in rapid A-fib and so presents to the ED.  She has no clear symptoms related to rapid A-fib and this resolves prior to any medication administration after we obtain our initial twelve-lead EKG.  Subsequent maintained on the monitor without significant dysrhythmia or any recurrence of rapid A-fib.  Provide a typical dose of her home metoprolol  in addition to supportive care related to a viral syndrome.   Workup is otherwise quite benign with a clear CXR, normal CBC, metabolic panel, negative flu/COVID test.  No barriers to outpatient management at this point, discharged with daughter with continued supportive care.  Discussed return precautions  Clinical Course as of 10/10/24 1454  Sat Oct 10, 2024  1308 Cough, congestion since christmas [DS]  1311 Repeat EKG NSR, no longer rapid afib.  [DS]  1451 Reassessed, continues to be in NSR .  Reports feeling much better after the Tussionex and would like a prescription for this at home.  We discussed likely viral syndrome, care at home, and expectant management and ED return precautions. [DS]    Clinical Course User Index [DS] Claudene Rover, MD     FINAL CLINICAL IMPRESSION(S) / ED DIAGNOSES   Final diagnoses:  Atrial fibrillation with RVR (HCC)  Viral URI with cough     Rx / DC Orders   ED Discharge Orders          Ordered    chlorpheniramine-HYDROcodone  (TUSSIONEX) 10-8 MG/5ML  Every 12 hours PRN        10/10/24 1452             Note:  This document was prepared using Dragon voice recognition software and may include unintentional dictation errors.   Claudene Rover, MD 10/10/24 1455  "

## 2024-10-10 NOTE — Discharge Instructions (Signed)
 Go directly to the emergency department for your high heart rate heart rate with history of atrial fibrillation.

## 2024-10-10 NOTE — ED Triage Notes (Signed)
 Patient presents to UC for cough, stye on both eyes. Reports cough started 10/08/24. Stye on both eyes x 2 weeks. Treating cough with nyquil.

## 2024-10-10 NOTE — ED Notes (Addendum)
 Patient is being discharged from the Urgent Care and sent to the Emergency Department via POV with daugher. Per Corlis, NP , patient is in need of higher level of care due to Tachycardia. Patient is aware and verbalizes understanding of plan of care.  Vitals:   10/10/24 1158 10/10/24 1159  BP: (!) 160/97 (!) 133/90  Pulse: (S) (!) 134   Resp: 16   Temp: 97.9 F (36.6 C)   SpO2: 95%

## 2024-10-10 NOTE — Discharge Instructions (Addendum)
 Use Tylenol  for pain and fevers.  Up to 1000 mg per dose, up to 4 times per day.  Do not take more than 4000 mg of Tylenol /acetaminophen  within 24 hours..  Tussionex cough syrup 1-2 times per day.  This medication may make you a little bit sleepy so be careful with it.  Taking once per day at night to help with sleep as often the safest option.   Continue your prescribed medications, including metoprolol   If you develop any worsening symptoms and please return to the ED

## 2024-10-10 NOTE — ED Notes (Signed)
 Called CCMD to add pt to monitoring.

## 2024-10-19 ENCOUNTER — Other Ambulatory Visit

## 2024-11-05 ENCOUNTER — Telehealth: Payer: Self-pay | Admitting: Cardiovascular Disease

## 2024-11-05 MED ORDER — AMOXICILLIN 500 MG PO CAPS: 2000.0000 mg | ORAL_CAPSULE | Freq: Once | ORAL | 0 refills | Status: AC

## 2024-11-05 NOTE — Telephone Encounter (Signed)
" ° °  Pre-operative Risk Assessment    Patient Name: Kayla Gray  DOB: 10/13/1936 MRN: [id number]     Request for Surgical Clearance    Procedure:  Dental Cleaning   Date of Surgery:  Clearance TBD    Will reschedule                              Surgeon:  Dr. Jolan Kohler- Melba  Surgeon's Group or Practice Name:  [facility]  Phone number:  [phone] Fax number:  [phone]   Type of Clearance Requested:   - Medical    Type of Anesthesia:  Local    Additional requests/questions:  Needs to  know for any cleanings does she need to pre- medicate and for extractions what medication needs to be held   Kayla Gray   11/05/2024, 12:26 PM   "

## 2024-11-05 NOTE — Telephone Encounter (Signed)
 I s/w pt's daughter Curtis BROTHERS, DPR that we have sent in an ABX to [facility] in [address] Address. Daughter thanked me for the help today.

## 2024-11-05 NOTE — Telephone Encounter (Signed)
"  ° °  Patient Name: Kayla Gray  DOB: 18-Aug-1936 MRN: [id number]  Primary Cardiologist: None  Chart reviewed as part of pre-operative protocol coverage.   Dental extractions of 1-2 teeth and dental cleanings are considered low risk procedures per guidelines and generally do not require any specific cardiac clearance. It is also generally accepted that for extractions of 1-2 teeth and dental cleanings, there is no need to interrupt blood thinner therapy.  SBE prophylaxis is required for the patient from a cardiac standpoint. Amoxicillin  2000 mg to be taken 1 hour before dental cleanings or procedures   I will route this recommendation to the requesting party via Epic fax function and remove from pre-op pool.  Please call with questions.  Barnie Hila, NP 11/05/2024, 12:41 PM  "

## 2024-12-08 ENCOUNTER — Ambulatory Visit

## 2024-12-08 ENCOUNTER — Encounter
